# Patient Record
Sex: Male | Born: 1940 | Race: White | Hispanic: No | Marital: Married | Smoking: Current every day smoker
Health system: Southern US, Community
[De-identification: ages and names within clinical notes are randomized; demographics above are authoritative.]

## PROBLEM LIST (undated history)

## (undated) DIAGNOSIS — R9430 Abnormal result of cardiovascular function study, unspecified: Secondary | ICD-10-CM

## (undated) DIAGNOSIS — I739 Peripheral vascular disease, unspecified: Secondary | ICD-10-CM

## (undated) DIAGNOSIS — R251 Tremor, unspecified: Secondary | ICD-10-CM

## (undated) DIAGNOSIS — I714 Abdominal aortic aneurysm, without rupture, unspecified: Secondary | ICD-10-CM

## (undated) DIAGNOSIS — R011 Cardiac murmur, unspecified: Secondary | ICD-10-CM

## (undated) DIAGNOSIS — F17210 Nicotine dependence, cigarettes, uncomplicated: Secondary | ICD-10-CM

## (undated) DIAGNOSIS — M1990 Unspecified osteoarthritis, unspecified site: Secondary | ICD-10-CM

## (undated) DIAGNOSIS — E669 Obesity, unspecified: Secondary | ICD-10-CM

## (undated) DIAGNOSIS — I10 Essential (primary) hypertension: Secondary | ICD-10-CM

## (undated) DIAGNOSIS — I4510 Unspecified right bundle-branch block: Secondary | ICD-10-CM

## (undated) DIAGNOSIS — E8770 Fluid overload, unspecified: Secondary | ICD-10-CM

## (undated) DIAGNOSIS — Z9049 Acquired absence of other specified parts of digestive tract: Secondary | ICD-10-CM

## (undated) DIAGNOSIS — Z96659 Presence of unspecified artificial knee joint: Secondary | ICD-10-CM

## (undated) DIAGNOSIS — C61 Malignant neoplasm of prostate: Secondary | ICD-10-CM

## (undated) DIAGNOSIS — IMO0002 Reserved for concepts with insufficient information to code with codable children: Secondary | ICD-10-CM

## (undated) DIAGNOSIS — I4891 Unspecified atrial fibrillation: Secondary | ICD-10-CM

## (undated) DIAGNOSIS — Z01810 Encounter for preprocedural cardiovascular examination: Secondary | ICD-10-CM

## (undated) DIAGNOSIS — R001 Bradycardia, unspecified: Secondary | ICD-10-CM

## (undated) DIAGNOSIS — I2510 Atherosclerotic heart disease of native coronary artery without angina pectoris: Secondary | ICD-10-CM

## (undated) DIAGNOSIS — I219 Acute myocardial infarction, unspecified: Secondary | ICD-10-CM

## (undated) DIAGNOSIS — E785 Hyperlipidemia, unspecified: Secondary | ICD-10-CM

## (undated) DIAGNOSIS — Z7901 Long term (current) use of anticoagulants: Secondary | ICD-10-CM

## (undated) DIAGNOSIS — I779 Disorder of arteries and arterioles, unspecified: Secondary | ICD-10-CM

## (undated) HISTORY — DX: Atherosclerotic heart disease of native coronary artery without angina pectoris: I25.10

## (undated) HISTORY — DX: Fluid overload, unspecified: E87.70

## (undated) HISTORY — DX: Unspecified atrial fibrillation: I48.91

## (undated) HISTORY — PX: JOINT REPLACEMENT: SHX530

## (undated) HISTORY — DX: Essential (primary) hypertension: I10

## (undated) HISTORY — PX: ANAL FISSURE REPAIR: SHX2312

## (undated) HISTORY — DX: Unspecified right bundle-branch block: I45.10

## (undated) HISTORY — DX: Malignant neoplasm of prostate: C61

## (undated) HISTORY — DX: Reserved for concepts with insufficient information to code with codable children: IMO0002

## (undated) HISTORY — DX: Nicotine dependence, cigarettes, uncomplicated: F17.210

## (undated) HISTORY — DX: Abdominal aortic aneurysm, without rupture: I71.4

## (undated) HISTORY — DX: Abdominal aortic aneurysm, without rupture, unspecified: I71.40

## (undated) HISTORY — DX: Abnormal result of cardiovascular function study, unspecified: R94.30

## (undated) HISTORY — DX: Disorder of arteries and arterioles, unspecified: I77.9

## (undated) HISTORY — DX: Hyperlipidemia, unspecified: E78.5

## (undated) HISTORY — DX: Peripheral vascular disease, unspecified: I73.9

## (undated) HISTORY — DX: Encounter for preprocedural cardiovascular examination: Z01.810

## (undated) HISTORY — DX: Obesity, unspecified: E66.9

## (undated) HISTORY — DX: Acquired absence of other specified parts of digestive tract: Z90.49

## (undated) HISTORY — PX: TOTAL KNEE ARTHROPLASTY: SHX125

## (undated) HISTORY — DX: Acute myocardial infarction, unspecified: I21.9

## (undated) HISTORY — DX: Presence of unspecified artificial knee joint: Z96.659

## (undated) HISTORY — DX: Bradycardia, unspecified: R00.1

## (undated) HISTORY — PX: PR VEIN BYPASS GRAFT,AORTO-FEM-POP: 35551

## (undated) HISTORY — DX: Long term (current) use of anticoagulants: Z79.01

---

## 1982-03-11 DIAGNOSIS — I219 Acute myocardial infarction, unspecified: Secondary | ICD-10-CM

## 1982-03-11 HISTORY — DX: Acute myocardial infarction, unspecified: I21.9

## 1992-03-11 HISTORY — PX: CORONARY ARTERY BYPASS GRAFT: SHX141

## 2003-03-07 ENCOUNTER — Encounter: Payer: Self-pay | Admitting: Cardiology

## 2003-05-02 ENCOUNTER — Inpatient Hospital Stay (HOSPITAL_COMMUNITY): Admission: RE | Admit: 2003-05-02 | Discharge: 2003-05-06 | Payer: Self-pay | Admitting: Orthopedic Surgery

## 2004-02-14 ENCOUNTER — Ambulatory Visit: Payer: Self-pay | Admitting: Cardiology

## 2004-09-21 ENCOUNTER — Emergency Department (HOSPITAL_COMMUNITY): Admission: EM | Admit: 2004-09-21 | Discharge: 2004-09-21 | Payer: Self-pay | Admitting: Family Medicine

## 2004-10-15 ENCOUNTER — Ambulatory Visit: Payer: Self-pay | Admitting: Cardiology

## 2004-11-05 ENCOUNTER — Ambulatory Visit: Payer: Self-pay | Admitting: Cardiology

## 2004-12-18 ENCOUNTER — Ambulatory Visit: Payer: Self-pay | Admitting: Cardiology

## 2005-07-01 ENCOUNTER — Ambulatory Visit (HOSPITAL_COMMUNITY): Admission: RE | Admit: 2005-07-01 | Discharge: 2005-07-01 | Payer: Self-pay | Admitting: Urology

## 2005-07-04 ENCOUNTER — Ambulatory Visit: Payer: Self-pay | Admitting: Cardiology

## 2005-07-09 ENCOUNTER — Ambulatory Visit: Admission: RE | Admit: 2005-07-09 | Discharge: 2005-10-07 | Payer: Self-pay | Admitting: Radiation Oncology

## 2005-07-26 ENCOUNTER — Ambulatory Visit: Payer: Self-pay | Admitting: Cardiology

## 2005-07-30 ENCOUNTER — Ambulatory Visit: Payer: Self-pay

## 2005-07-30 ENCOUNTER — Encounter: Payer: Self-pay | Admitting: Cardiology

## 2005-10-02 ENCOUNTER — Encounter: Admission: RE | Admit: 2005-10-02 | Discharge: 2005-10-02 | Payer: Self-pay | Admitting: Urology

## 2005-10-09 ENCOUNTER — Ambulatory Visit (HOSPITAL_BASED_OUTPATIENT_CLINIC_OR_DEPARTMENT_OTHER): Admission: RE | Admit: 2005-10-09 | Discharge: 2005-10-09 | Payer: Self-pay | Admitting: Urology

## 2005-10-31 ENCOUNTER — Ambulatory Visit: Admission: RE | Admit: 2005-10-31 | Discharge: 2005-12-02 | Payer: Self-pay | Admitting: Radiation Oncology

## 2005-12-17 ENCOUNTER — Inpatient Hospital Stay (HOSPITAL_COMMUNITY): Admission: RE | Admit: 2005-12-17 | Discharge: 2005-12-20 | Payer: Self-pay | Admitting: Orthopedic Surgery

## 2006-02-04 ENCOUNTER — Ambulatory Visit: Payer: Self-pay | Admitting: Cardiology

## 2006-04-03 ENCOUNTER — Encounter: Admission: RE | Admit: 2006-04-03 | Discharge: 2006-04-03 | Payer: Self-pay | Admitting: *Deleted

## 2006-04-22 ENCOUNTER — Ambulatory Visit: Payer: Self-pay | Admitting: Cardiology

## 2006-07-07 ENCOUNTER — Encounter: Admission: RE | Admit: 2006-07-07 | Discharge: 2006-07-07 | Payer: Self-pay | Admitting: Gastroenterology

## 2006-07-08 ENCOUNTER — Ambulatory Visit (HOSPITAL_COMMUNITY): Admission: RE | Admit: 2006-07-08 | Discharge: 2006-07-08 | Payer: Self-pay | Admitting: Gastroenterology

## 2006-07-08 ENCOUNTER — Encounter (INDEPENDENT_AMBULATORY_CARE_PROVIDER_SITE_OTHER): Payer: Self-pay | Admitting: *Deleted

## 2006-10-02 ENCOUNTER — Ambulatory Visit: Payer: Self-pay | Admitting: *Deleted

## 2007-04-02 ENCOUNTER — Ambulatory Visit: Payer: Self-pay | Admitting: *Deleted

## 2007-04-22 ENCOUNTER — Ambulatory Visit: Payer: Self-pay | Admitting: Cardiology

## 2007-05-01 ENCOUNTER — Ambulatory Visit: Payer: Self-pay | Admitting: Cardiology

## 2007-10-01 ENCOUNTER — Ambulatory Visit: Payer: Self-pay | Admitting: *Deleted

## 2007-11-24 ENCOUNTER — Ambulatory Visit: Payer: Self-pay | Admitting: Cardiology

## 2007-12-01 ENCOUNTER — Ambulatory Visit: Payer: Self-pay | Admitting: Cardiology

## 2007-12-01 ENCOUNTER — Ambulatory Visit: Payer: Self-pay

## 2007-12-01 ENCOUNTER — Encounter: Payer: Self-pay | Admitting: Cardiology

## 2007-12-07 ENCOUNTER — Ambulatory Visit: Payer: Self-pay | Admitting: Cardiovascular Disease

## 2007-12-14 ENCOUNTER — Ambulatory Visit: Payer: Self-pay | Admitting: Cardiology

## 2007-12-14 LAB — CONVERTED CEMR LAB: Prothrombin Time: 81.5 s (ref 10.9–13.3)

## 2007-12-17 ENCOUNTER — Ambulatory Visit: Payer: Self-pay | Admitting: Cardiology

## 2007-12-24 ENCOUNTER — Ambulatory Visit: Payer: Self-pay | Admitting: Cardiology

## 2008-01-07 ENCOUNTER — Ambulatory Visit: Payer: Self-pay | Admitting: Cardiology

## 2008-01-07 ENCOUNTER — Ambulatory Visit: Payer: Self-pay | Admitting: Cardiovascular Disease

## 2008-01-21 ENCOUNTER — Ambulatory Visit: Payer: Self-pay | Admitting: Cardiology

## 2008-01-29 ENCOUNTER — Ambulatory Visit: Payer: Self-pay | Admitting: Cardiology

## 2008-01-29 ENCOUNTER — Ambulatory Visit: Payer: Self-pay | Admitting: Cardiovascular Disease

## 2008-02-12 ENCOUNTER — Ambulatory Visit: Payer: Self-pay | Admitting: Internal Medicine

## 2008-02-26 ENCOUNTER — Ambulatory Visit: Payer: Self-pay | Admitting: Cardiovascular Disease

## 2008-03-10 ENCOUNTER — Ambulatory Visit: Payer: Self-pay | Admitting: Internal Medicine

## 2008-03-24 ENCOUNTER — Ambulatory Visit: Payer: Self-pay | Admitting: Cardiovascular Disease

## 2008-03-31 ENCOUNTER — Ambulatory Visit: Payer: Self-pay | Admitting: *Deleted

## 2008-04-07 ENCOUNTER — Ambulatory Visit: Payer: Self-pay | Admitting: Internal Medicine

## 2008-04-26 ENCOUNTER — Ambulatory Visit: Payer: Self-pay | Admitting: Cardiology

## 2008-05-10 ENCOUNTER — Ambulatory Visit: Payer: Self-pay | Admitting: Internal Medicine

## 2008-05-24 ENCOUNTER — Ambulatory Visit: Payer: Self-pay | Admitting: Internal Medicine

## 2008-06-07 ENCOUNTER — Ambulatory Visit: Payer: Self-pay | Admitting: Cardiology

## 2008-06-28 ENCOUNTER — Ambulatory Visit: Payer: Self-pay | Admitting: Cardiology

## 2008-07-26 ENCOUNTER — Ambulatory Visit: Payer: Self-pay | Admitting: Internal Medicine

## 2008-08-10 ENCOUNTER — Encounter: Payer: Self-pay | Admitting: *Deleted

## 2008-08-23 ENCOUNTER — Ambulatory Visit: Payer: Self-pay | Admitting: Internal Medicine

## 2008-08-23 LAB — CONVERTED CEMR LAB: POC INR: 2.1

## 2008-09-01 ENCOUNTER — Ambulatory Visit: Payer: Self-pay | Admitting: *Deleted

## 2008-09-14 ENCOUNTER — Encounter: Payer: Self-pay | Admitting: *Deleted

## 2008-09-20 ENCOUNTER — Encounter (INDEPENDENT_AMBULATORY_CARE_PROVIDER_SITE_OTHER): Payer: Self-pay | Admitting: Cardiology

## 2008-09-20 ENCOUNTER — Ambulatory Visit: Payer: Self-pay | Admitting: Cardiology

## 2008-10-18 ENCOUNTER — Ambulatory Visit: Payer: Self-pay | Admitting: Internal Medicine

## 2008-10-18 LAB — CONVERTED CEMR LAB: POC INR: 2.1

## 2008-11-08 ENCOUNTER — Encounter: Payer: Self-pay | Admitting: Cardiology

## 2008-11-08 DIAGNOSIS — E8770 Fluid overload, unspecified: Secondary | ICD-10-CM

## 2008-11-08 DIAGNOSIS — Z87891 Personal history of nicotine dependence: Secondary | ICD-10-CM

## 2008-11-08 DIAGNOSIS — I714 Abdominal aortic aneurysm, without rupture, unspecified: Secondary | ICD-10-CM | POA: Insufficient documentation

## 2008-11-08 DIAGNOSIS — E785 Hyperlipidemia, unspecified: Secondary | ICD-10-CM

## 2008-11-09 ENCOUNTER — Ambulatory Visit: Payer: Self-pay | Admitting: Cardiology

## 2008-11-15 ENCOUNTER — Ambulatory Visit: Payer: Self-pay | Admitting: Cardiology

## 2008-11-15 LAB — CONVERTED CEMR LAB: POC INR: 2.8

## 2008-12-13 ENCOUNTER — Ambulatory Visit: Payer: Self-pay

## 2008-12-13 ENCOUNTER — Ambulatory Visit: Payer: Self-pay | Admitting: Cardiology

## 2008-12-13 LAB — CONVERTED CEMR LAB: POC INR: 2.8

## 2009-01-09 ENCOUNTER — Ambulatory Visit: Payer: Self-pay | Admitting: Cardiology

## 2009-01-09 LAB — CONVERTED CEMR LAB: POC INR: 2.6

## 2009-02-06 ENCOUNTER — Ambulatory Visit: Payer: Self-pay | Admitting: Cardiology

## 2009-02-06 LAB — CONVERTED CEMR LAB: POC INR: 2.9

## 2009-02-22 ENCOUNTER — Ambulatory Visit: Payer: Self-pay | Admitting: Vascular Surgery

## 2009-03-06 ENCOUNTER — Ambulatory Visit: Payer: Self-pay | Admitting: Internal Medicine

## 2009-03-22 ENCOUNTER — Emergency Department (HOSPITAL_COMMUNITY): Admission: EM | Admit: 2009-03-22 | Discharge: 2009-03-22 | Payer: Self-pay | Admitting: Family Medicine

## 2009-04-03 ENCOUNTER — Ambulatory Visit: Payer: Self-pay | Admitting: Cardiology

## 2009-04-03 LAB — CONVERTED CEMR LAB: INR: 2.5

## 2009-05-01 ENCOUNTER — Ambulatory Visit: Payer: Self-pay | Admitting: Internal Medicine

## 2009-05-16 ENCOUNTER — Ambulatory Visit: Payer: Self-pay | Admitting: Cardiology

## 2009-05-29 ENCOUNTER — Ambulatory Visit: Payer: Self-pay | Admitting: Cardiology

## 2009-06-02 ENCOUNTER — Telehealth: Payer: Self-pay | Admitting: Cardiology

## 2009-06-26 ENCOUNTER — Ambulatory Visit: Payer: Self-pay | Admitting: Cardiology

## 2009-06-26 LAB — CONVERTED CEMR LAB
INR: 2.7
POC INR: 2.7

## 2009-07-03 ENCOUNTER — Ambulatory Visit: Payer: Self-pay | Admitting: Surgery

## 2009-07-17 ENCOUNTER — Telehealth: Payer: Self-pay | Admitting: Cardiology

## 2009-07-17 ENCOUNTER — Ambulatory Visit: Payer: Self-pay | Admitting: Surgery

## 2009-07-24 ENCOUNTER — Telehealth: Payer: Self-pay | Admitting: Cardiology

## 2009-07-24 ENCOUNTER — Ambulatory Visit: Payer: Self-pay | Admitting: Cardiovascular Disease

## 2009-07-24 LAB — CONVERTED CEMR LAB: POC INR: 1.8

## 2009-07-27 ENCOUNTER — Ambulatory Visit: Payer: Self-pay | Admitting: Surgery

## 2009-07-27 ENCOUNTER — Ambulatory Visit (HOSPITAL_COMMUNITY): Admission: RE | Admit: 2009-07-27 | Discharge: 2009-07-27 | Payer: Self-pay | Admitting: Surgery

## 2009-08-03 ENCOUNTER — Encounter (HOSPITAL_BASED_OUTPATIENT_CLINIC_OR_DEPARTMENT_OTHER): Admission: RE | Admit: 2009-08-03 | Discharge: 2009-11-01 | Payer: Self-pay | Admitting: Internal Medicine

## 2009-08-04 ENCOUNTER — Ambulatory Visit (HOSPITAL_COMMUNITY): Admission: RE | Admit: 2009-08-04 | Discharge: 2009-08-04 | Payer: Self-pay | Admitting: Internal Medicine

## 2009-08-08 ENCOUNTER — Ambulatory Visit: Payer: Self-pay | Admitting: Internal Medicine

## 2009-08-08 LAB — CONVERTED CEMR LAB: POC INR: 1.2

## 2009-08-09 ENCOUNTER — Encounter: Payer: Self-pay | Admitting: Cardiology

## 2009-08-16 ENCOUNTER — Ambulatory Visit: Payer: Self-pay | Admitting: Cardiology

## 2009-08-16 LAB — CONVERTED CEMR LAB: POC INR: 1.5

## 2009-08-18 ENCOUNTER — Emergency Department (HOSPITAL_COMMUNITY): Admission: EM | Admit: 2009-08-18 | Discharge: 2009-08-18 | Payer: Self-pay | Admitting: Emergency Medicine

## 2009-08-21 ENCOUNTER — Encounter: Payer: Self-pay | Admitting: Cardiology

## 2009-08-21 ENCOUNTER — Ambulatory Visit: Payer: Self-pay | Admitting: Surgery

## 2009-08-23 ENCOUNTER — Inpatient Hospital Stay (HOSPITAL_COMMUNITY): Admission: EM | Admit: 2009-08-23 | Discharge: 2009-08-27 | Payer: Self-pay | Admitting: Emergency Medicine

## 2009-08-23 ENCOUNTER — Encounter (INDEPENDENT_AMBULATORY_CARE_PROVIDER_SITE_OTHER): Payer: Self-pay | Admitting: Emergency Medicine

## 2009-08-23 ENCOUNTER — Ambulatory Visit: Payer: Self-pay | Admitting: Vascular Surgery

## 2009-08-25 ENCOUNTER — Telehealth: Payer: Self-pay | Admitting: Cardiology

## 2009-08-29 ENCOUNTER — Encounter: Payer: Self-pay | Admitting: Cardiology

## 2009-08-30 ENCOUNTER — Encounter: Payer: Self-pay | Admitting: Cardiology

## 2009-08-31 ENCOUNTER — Encounter: Payer: Self-pay | Admitting: Cardiology

## 2009-08-31 ENCOUNTER — Encounter: Payer: Self-pay | Admitting: Internal Medicine

## 2009-08-31 LAB — CONVERTED CEMR LAB: Prothrombin Time: 43.2 s

## 2009-09-07 ENCOUNTER — Encounter: Payer: Self-pay | Admitting: Cardiology

## 2009-09-18 ENCOUNTER — Encounter: Payer: Self-pay | Admitting: Internal Medicine

## 2009-09-18 ENCOUNTER — Ambulatory Visit: Payer: Self-pay | Admitting: Surgery

## 2009-09-18 ENCOUNTER — Encounter: Payer: Self-pay | Admitting: Cardiology

## 2009-09-22 ENCOUNTER — Inpatient Hospital Stay (HOSPITAL_COMMUNITY): Admission: EM | Admit: 2009-09-22 | Discharge: 2009-09-23 | Payer: Self-pay | Admitting: Emergency Medicine

## 2009-09-22 ENCOUNTER — Encounter (INDEPENDENT_AMBULATORY_CARE_PROVIDER_SITE_OTHER): Payer: Self-pay | Admitting: Family Medicine

## 2009-09-22 ENCOUNTER — Ambulatory Visit: Payer: Self-pay | Admitting: Vascular Surgery

## 2009-09-22 ENCOUNTER — Ambulatory Visit: Payer: Self-pay | Admitting: Cardiovascular Disease

## 2009-09-22 ENCOUNTER — Encounter (INDEPENDENT_AMBULATORY_CARE_PROVIDER_SITE_OTHER): Payer: Self-pay | Admitting: Internal Medicine

## 2009-09-24 ENCOUNTER — Encounter: Payer: Self-pay | Admitting: Cardiology

## 2009-09-25 ENCOUNTER — Encounter: Payer: Self-pay | Admitting: Cardiology

## 2009-09-25 LAB — CONVERTED CEMR LAB
POC INR: 2
Prothrombin Time: 19.9 s

## 2009-09-27 ENCOUNTER — Ambulatory Visit: Payer: Self-pay | Admitting: Cardiology

## 2009-09-27 ENCOUNTER — Inpatient Hospital Stay (HOSPITAL_COMMUNITY): Admission: EM | Admit: 2009-09-27 | Discharge: 2009-10-05 | Payer: Self-pay | Admitting: Emergency Medicine

## 2009-09-29 ENCOUNTER — Ambulatory Visit: Payer: Self-pay | Admitting: Internal Medicine

## 2009-10-04 ENCOUNTER — Telehealth: Payer: Self-pay | Admitting: Cardiology

## 2009-10-26 ENCOUNTER — Encounter: Payer: Self-pay | Admitting: Cardiology

## 2009-10-26 ENCOUNTER — Encounter: Payer: Self-pay | Admitting: Cardiovascular Disease

## 2009-10-26 LAB — CONVERTED CEMR LAB
POC INR: 1.86
Prothrombin Time: 21.6 s

## 2009-11-02 ENCOUNTER — Encounter: Payer: Self-pay | Admitting: Internal Medicine

## 2009-11-02 LAB — CONVERTED CEMR LAB
POC INR: 2.7
Prothrombin Time: 32 s

## 2009-11-06 ENCOUNTER — Encounter: Payer: Self-pay | Admitting: Cardiology

## 2009-11-07 ENCOUNTER — Encounter: Payer: Self-pay | Admitting: Cardiology

## 2009-11-14 ENCOUNTER — Encounter: Payer: Self-pay | Admitting: Cardiovascular Disease

## 2009-11-20 ENCOUNTER — Encounter: Payer: Self-pay | Admitting: Cardiology

## 2009-11-22 ENCOUNTER — Encounter: Payer: Self-pay | Admitting: Internal Medicine

## 2009-11-22 ENCOUNTER — Encounter: Payer: Self-pay | Admitting: Cardiology

## 2009-11-26 ENCOUNTER — Encounter: Payer: Self-pay | Admitting: Cardiology

## 2009-11-27 ENCOUNTER — Encounter: Payer: Self-pay | Admitting: Cardiovascular Disease

## 2009-11-27 LAB — CONVERTED CEMR LAB: Prothrombin Time: 33.6 s

## 2009-12-04 ENCOUNTER — Encounter: Payer: Self-pay | Admitting: Cardiovascular Disease

## 2009-12-04 ENCOUNTER — Encounter: Payer: Self-pay | Admitting: Cardiology

## 2009-12-04 LAB — CONVERTED CEMR LAB
POC INR: 2
Prothrombin Time: 23.7 s

## 2009-12-11 ENCOUNTER — Encounter: Payer: Self-pay | Admitting: Internal Medicine

## 2009-12-11 LAB — CONVERTED CEMR LAB
POC INR: 1.5
Prothrombin Time: 18 s

## 2009-12-13 ENCOUNTER — Encounter: Payer: Self-pay | Admitting: Cardiology

## 2009-12-18 ENCOUNTER — Encounter: Payer: Self-pay | Admitting: Cardiology

## 2009-12-18 LAB — CONVERTED CEMR LAB: POC INR: 1.6

## 2009-12-19 ENCOUNTER — Ambulatory Visit: Payer: Self-pay | Admitting: Infectious Disease

## 2009-12-19 DIAGNOSIS — L02419 Cutaneous abscess of limb, unspecified: Secondary | ICD-10-CM | POA: Insufficient documentation

## 2009-12-19 DIAGNOSIS — L03119 Cellulitis of unspecified part of limb: Secondary | ICD-10-CM

## 2009-12-19 LAB — CONVERTED CEMR LAB
AST: 23 units/L (ref 0–37)
Albumin: 4.1 g/dL (ref 3.5–5.2)
Alkaline Phosphatase: 125 units/L — ABNORMAL HIGH (ref 39–117)
BUN: 17 mg/dL (ref 6–23)
Basophils Absolute: 0 10*3/uL (ref 0.0–0.1)
Basophils Relative: 1 % (ref 0–1)
CRP: 1.4 mg/dL — ABNORMAL HIGH (ref <0.6)
Eosinophils Absolute: 0.2 10*3/uL (ref 0.0–0.7)
Glucose, Bld: 113 mg/dL — ABNORMAL HIGH (ref 70–99)
MCHC: 31.2 g/dL (ref 30.0–36.0)
MCV: 89.8 fL (ref 78.0–100.0)
Monocytes Absolute: 0.8 10*3/uL (ref 0.1–1.0)
Monocytes Relative: 12 % (ref 3–12)
Neutro Abs: 4.2 10*3/uL (ref 1.7–7.7)
Neutrophils Relative %: 60 % (ref 43–77)
Potassium: 3.8 meq/L (ref 3.5–5.3)
RDW: 15.3 % (ref 11.5–15.5)
Total Bilirubin: 0.6 mg/dL (ref 0.3–1.2)

## 2009-12-20 ENCOUNTER — Encounter: Payer: Self-pay | Admitting: Cardiology

## 2009-12-26 ENCOUNTER — Encounter: Payer: Self-pay | Admitting: Infectious Disease

## 2010-01-05 ENCOUNTER — Encounter: Payer: Self-pay | Admitting: Cardiovascular Disease

## 2010-01-05 LAB — CONVERTED CEMR LAB
POC INR: 2.7
Prothrombin Time: 27.2 s

## 2010-01-09 ENCOUNTER — Encounter: Payer: Self-pay | Admitting: Infectious Disease

## 2010-01-10 ENCOUNTER — Encounter: Payer: Self-pay | Admitting: Cardiology

## 2010-01-10 LAB — CONVERTED CEMR LAB
POC INR: 2.7
Prothrombin Time: 26.8 s

## 2010-01-17 ENCOUNTER — Encounter: Payer: Self-pay | Admitting: Cardiovascular Disease

## 2010-01-24 ENCOUNTER — Encounter: Payer: Self-pay | Admitting: Cardiology

## 2010-01-31 ENCOUNTER — Encounter: Payer: Self-pay | Admitting: Cardiology

## 2010-01-31 LAB — CONVERTED CEMR LAB: POC INR: 2.1

## 2010-02-02 ENCOUNTER — Ambulatory Visit: Payer: Self-pay | Admitting: Cardiology

## 2010-02-09 ENCOUNTER — Inpatient Hospital Stay (HOSPITAL_COMMUNITY)
Admission: RE | Admit: 2010-02-09 | Discharge: 2010-02-13 | Payer: Self-pay | Source: Home / Self Care | Admitting: Orthopedic Surgery

## 2010-02-14 ENCOUNTER — Encounter: Payer: Self-pay | Admitting: Internal Medicine

## 2010-02-14 LAB — CONVERTED CEMR LAB: POC INR: 1.7

## 2010-02-15 ENCOUNTER — Encounter: Payer: Self-pay | Admitting: Internal Medicine

## 2010-02-20 ENCOUNTER — Encounter (INDEPENDENT_AMBULATORY_CARE_PROVIDER_SITE_OTHER): Payer: Self-pay | Admitting: Pharmacist

## 2010-02-21 ENCOUNTER — Encounter: Payer: Self-pay | Admitting: Cardiology

## 2010-02-21 LAB — CONVERTED CEMR LAB
POC INR: 1.98
Prothrombin Time: 22.7 s

## 2010-02-23 ENCOUNTER — Encounter: Payer: Self-pay | Admitting: Internal Medicine

## 2010-02-28 ENCOUNTER — Ambulatory Visit: Payer: Self-pay | Admitting: Infectious Disease

## 2010-02-28 DIAGNOSIS — B951 Streptococcus, group B, as the cause of diseases classified elsewhere: Secondary | ICD-10-CM

## 2010-03-07 ENCOUNTER — Encounter: Payer: Self-pay | Admitting: Cardiology

## 2010-03-13 ENCOUNTER — Encounter: Payer: Self-pay | Admitting: Internal Medicine

## 2010-03-20 ENCOUNTER — Encounter: Payer: Self-pay | Admitting: Internal Medicine

## 2010-03-26 ENCOUNTER — Inpatient Hospital Stay (HOSPITAL_COMMUNITY)
Admission: RE | Admit: 2010-03-26 | Discharge: 2010-03-30 | Payer: Self-pay | Source: Home / Self Care | Attending: Orthopedic Surgery | Admitting: Orthopedic Surgery

## 2010-03-26 LAB — DIFFERENTIAL
Basophils Absolute: 0.1 10*3/uL (ref 0.0–0.1)
Basophils Relative: 1 % (ref 0–1)
Eosinophils Absolute: 0.1 10*3/uL (ref 0.0–0.7)
Eosinophils Relative: 2 % (ref 0–5)
Lymphocytes Relative: 26 % (ref 12–46)
Lymphs Abs: 2 10*3/uL (ref 0.7–4.0)
Monocytes Absolute: 0.8 10*3/uL (ref 0.1–1.0)
Monocytes Relative: 10 % (ref 3–12)
Neutro Abs: 4.5 10*3/uL (ref 1.7–7.7)
Neutrophils Relative %: 61 % (ref 43–77)

## 2010-03-26 LAB — BASIC METABOLIC PANEL
BUN: 19 mg/dL (ref 6–23)
CO2: 32 mEq/L (ref 19–32)
Calcium: 9.4 mg/dL (ref 8.4–10.5)
Chloride: 96 mEq/L (ref 96–112)
Creatinine, Ser: 0.69 mg/dL (ref 0.4–1.5)
GFR calc Af Amer: >60 mL/min (ref >60)
GFR calc non Af Amer: >60 mL/min (ref >60)
Glucose, Bld: 108 mg/dL — ABNORMAL HIGH (ref 70–99)
Potassium: 3.5 mEq/L (ref 3.5–5.1)
Sodium: 138 mEq/L (ref 135–145)

## 2010-03-26 LAB — PROTIME-INR
INR: 1.53 — ABNORMAL HIGH (ref 0.00–1.49)
Prothrombin Time: 18.6 seconds — ABNORMAL HIGH (ref 11.6–15.2)

## 2010-03-26 LAB — URINALYSIS, ROUTINE W REFLEX MICROSCOPIC
Bilirubin Urine: NEGATIVE
Hgb urine dipstick: NEGATIVE
Ketones, ur: NEGATIVE mg/dL
Nitrite: NEGATIVE
Protein, ur: NEGATIVE mg/dL
Specific Gravity, Urine: 1.028 (ref 1.005–1.030)
Urine Glucose, Fasting: NEGATIVE mg/dL
Urobilinogen, UA: 0.2 mg/dL (ref 0.0–1.0)
pH: 6.5 (ref 5.0–8.0)

## 2010-03-26 LAB — SURGICAL PCR SCREEN
MRSA, PCR: NEGATIVE
Staphylococcus aureus: NEGATIVE

## 2010-03-26 LAB — CBC
HCT: 33.8 % — ABNORMAL LOW (ref 39.0–52.0)
Hemoglobin: 11.1 g/dL — ABNORMAL LOW (ref 13.0–17.0)
MCH: 29.1 pg (ref 26.0–34.0)
MCHC: 32.8 g/dL (ref 30.0–36.0)
MCV: 88.5 fL (ref 78.0–100.0)
Platelets: 232 10*3/uL (ref 150–400)
RBC: 3.82 MIL/uL — ABNORMAL LOW (ref 4.22–5.81)
RDW: 16.4 % — ABNORMAL HIGH (ref 11.5–15.5)
WBC: 7.4 10*3/uL (ref 4.0–10.5)

## 2010-03-26 LAB — APTT: aPTT: 54 seconds — ABNORMAL HIGH (ref 24–37)

## 2010-03-28 LAB — GRAM STAIN

## 2010-03-28 LAB — GLUCOSE, CAPILLARY
Glucose-Capillary: 120 mg/dL — ABNORMAL HIGH (ref 70–99)
Glucose-Capillary: 112 mg/dL — ABNORMAL HIGH (ref 70–99)
Glucose-Capillary: 240 mg/dL — ABNORMAL HIGH (ref 70–99)
Glucose-Capillary: 116 mg/dL — ABNORMAL HIGH (ref 70–99)
Glucose-Capillary: 135 mg/dL — ABNORMAL HIGH (ref 70–99)
Glucose-Capillary: 121 mg/dL — ABNORMAL HIGH (ref 70–99)

## 2010-03-28 LAB — BASIC METABOLIC PANEL
BUN: 15 mg/dL (ref 6–23)
CO2: 30 mEq/L (ref 19–32)
Calcium: 8.8 mg/dL (ref 8.4–10.5)
Chloride: 101 mEq/L (ref 96–112)
Creatinine, Ser: 0.75 mg/dL (ref 0.4–1.5)
GFR calc Af Amer: >60 mL/min (ref >60)
GFR calc non Af Amer: >60 mL/min (ref >60)
Glucose, Bld: 142 mg/dL — ABNORMAL HIGH (ref 70–99)
Potassium: 3.6 mEq/L (ref 3.5–5.1)
Sodium: 138 mEq/L (ref 135–145)

## 2010-03-28 LAB — CBC
HCT: 29.7 % — ABNORMAL LOW (ref 39.0–52.0)
Hemoglobin: 9.4 g/dL — ABNORMAL LOW (ref 13.0–17.0)
MCH: 28.1 pg (ref 26.0–34.0)
MCHC: 31.6 g/dL (ref 30.0–36.0)
MCV: 88.7 fL (ref 78.0–100.0)
Platelets: 176 10*3/uL (ref 150–400)
RBC: 3.35 MIL/uL — ABNORMAL LOW (ref 4.22–5.81)
RDW: 16.3 % — ABNORMAL HIGH (ref 11.5–15.5)
WBC: 8.4 10*3/uL (ref 4.0–10.5)

## 2010-03-28 LAB — PROTIME-INR
INR: 1.3 (ref 0.00–1.49)
INR: 1.26 (ref 0.00–1.49)
Prothrombin Time: 16.4 seconds — ABNORMAL HIGH (ref 11.6–15.2)
Prothrombin Time: 16 seconds — ABNORMAL HIGH (ref 11.6–15.2)

## 2010-03-28 LAB — APTT: aPTT: 47 seconds — ABNORMAL HIGH (ref 24–37)

## 2010-04-02 ENCOUNTER — Encounter: Payer: Self-pay | Admitting: Cardiology

## 2010-04-02 LAB — CBC
HCT: 27.4 % — ABNORMAL LOW (ref 39.0–52.0)
HCT: 27.6 % — ABNORMAL LOW (ref 39.0–52.0)
Hemoglobin: 8.8 g/dL — ABNORMAL LOW (ref 13.0–17.0)
MCH: 28.2 pg (ref 26.0–34.0)
MCHC: 32.1 g/dL (ref 30.0–36.0)
MCHC: 33 g/dL (ref 30.0–36.0)
RDW: 16 % — ABNORMAL HIGH (ref 11.5–15.5)

## 2010-04-02 LAB — PROTIME-INR
INR: 1.47 (ref 0.00–1.49)
INR: 1.51 — ABNORMAL HIGH (ref 0.00–1.49)
Prothrombin Time: 18 seconds — ABNORMAL HIGH (ref 11.6–15.2)
Prothrombin Time: 18.4 seconds — ABNORMAL HIGH (ref 11.6–15.2)

## 2010-04-02 LAB — GLUCOSE, CAPILLARY
Glucose-Capillary: 111 mg/dL — ABNORMAL HIGH (ref 70–99)
Glucose-Capillary: 104 mg/dL — ABNORMAL HIGH (ref 70–99)
Glucose-Capillary: 102 mg/dL — ABNORMAL HIGH (ref 70–99)
Glucose-Capillary: 87 mg/dL (ref 70–99)
Glucose-Capillary: 135 mg/dL — ABNORMAL HIGH (ref 70–99)

## 2010-04-02 LAB — C-REACTIVE PROTEIN: CRP: 10.8 mg/dL — ABNORMAL HIGH (ref <0.6)

## 2010-04-02 LAB — DIFFERENTIAL
Basophils Absolute: 0 10*3/uL (ref 0.0–0.1)
Basophils Relative: 0 % (ref 0–1)
Eosinophils Relative: 3 % (ref 0–5)
Monocytes Absolute: 1 10*3/uL (ref 0.1–1.0)

## 2010-04-02 LAB — BASIC METABOLIC PANEL
BUN: 14 mg/dL (ref 6–23)
BUN: 14 mg/dL (ref 6–23)
BUN: 16 mg/dL (ref 6–23)
CO2: 31 mEq/L (ref 19–32)
Calcium: 8.7 mg/dL (ref 8.4–10.5)
Calcium: 8.8 mg/dL (ref 8.4–10.5)
Chloride: 100 mEq/L (ref 96–112)
Chloride: 99 mEq/L (ref 96–112)
Creatinine, Ser: 0.69 mg/dL (ref 0.4–1.5)
Creatinine, Ser: 0.67 mg/dL (ref 0.4–1.5)
Creatinine, Ser: 0.7 mg/dL (ref 0.4–1.5)
GFR calc Af Amer: >60 mL/min (ref >60)
GFR calc non Af Amer: >60 mL/min (ref >60)
GFR calc non Af Amer: >60 mL/min (ref >60)
Glucose, Bld: 104 mg/dL — ABNORMAL HIGH (ref 70–99)
Glucose, Bld: 123 mg/dL — ABNORMAL HIGH (ref 70–99)
Potassium: 2.9 mEq/L — ABNORMAL LOW (ref 3.5–5.1)
Sodium: 138 mEq/L (ref 135–145)

## 2010-04-02 LAB — WOUND CULTURE: Culture: NO GROWTH

## 2010-04-02 LAB — CONVERTED CEMR LAB: POC INR: 2.12

## 2010-04-02 LAB — SEDIMENTATION RATE: Sed Rate: 124 mm/hr — ABNORMAL HIGH (ref 0–16)

## 2010-04-02 NOTE — H&P (Signed)
NAME:  Derek Frye, KLEMP NO.:  [id number]  MEDICAL RECORD NO.:  [id number]          PATIENT TYPE:  INP  LOCATION:  1617                         FACILITY:  [facility]  PHYSICIAN:  Madlyn Frankel. Charlann Boxer, M.D.  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  BRIEF HISTORY:  Mr. 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  He is 5 weeks since his last procedure.  He presented to the office on March 15, 2010 with evidence of fixed flexed contracted knee with concern for soft tissue impingement leading to a nonhealing wound on the distal portion.  I reviewed with them that I felt that it was in his best interest to take him back to the operating room to wash out his knee, again debridement as necessary, exchange the antibiotic spacer to allow for decrease soft tissue tension with a possible utilization of wound VAC to get wound healing.  He has currently been on Rocephin as well as narcotic medications for pain.  He has had limited functional abilities based on his flexion contracture.  PAST MEDICAL HISTORY: 1. Peripheral vascular disease. 2. History of abdominal aortic aneurysm. 3. History of coronary artery disease with CABG in 1995 and 1994. 4. Diabetes. 5. Hypertension. 6. Hyperlipidemia. 7. Stasis ulcers.  SURGICAL HISTORY:  Includes left total knee replacement.  He had done well with his knee replacement for a couple years prior to the development of infection which most likely was a result of chronic venous stasis ulcers  and open wounds on his left foot and ankle.  His most recent procedure was 5 to 6 weeks ago with I and D and IV antibiotic spacer at this point with chronic wound.  SOCIAL HISTORY:  He is married and his wife is with him on every appointment and every hospitalization and is very loyal.  There has been having no smoking or alcohol history recently.  FAMILY MEDICAL HISTORY:  Positive for hypertension, coronary artery disease, and diabetes.  CURRENT MEDICATIONS: 1. Amaryl. 2. Metformin. 3. Zocor. 4. Coumadin. 5. Altace. 6. Lasix. 7. Rocephin. 8. Hydrocodone as needed. 9. Flexeril. 10.Aspirin.  DRUG ALLERGIES:  AMOXICILLIN and MORPHINE.  REVIEW OF SYSTEMS:  At the time of admission, review of systems is only positive for that noted in the HPI with no recent chest pains, productive cough, hemoptysis.  No breathing difficulties with wheezing. No dizziness, headaches, blurred vision, slurred speech.  PHYSICAL EXAMINATION:  VITAL SIGNS:  Examination at the time of admission he was afebrile with stable vital signs. GENERAL:  He is awake, alert, and oriented to all instances being addressed.  He did not require supplemental oxygen.  Did not have any breathing difficulties.  He is obese. ABDOMEN:  Soft, nontender.  Heart had normal  heart sounds and no significant murmur appreciated. EXTREMITIES:  His lower extremity examination revealed bilateral venous stasis skin changes of left side with chronic heel wounds been addressed with nursing at home.  His left knee revealed a distal portion of the incision was nonhealed and somewhat dehisced.  He had a persistent flexion and contracture most likely related to issues of a cement spacer inside of knee.  He had to use wheelchair for transportation.  His activities are otherwise limited.  LABORATORY DATA:  Labs were pending in his admission to the hospital. Chest x-ray pending as well.  ASSESSMENT:  Chronic infected left total knee,  status post resection with antibiotic spacer now with flexion and contracture and wound dehiscence  PLAN:  Mr. Cothron to be admitted to hospital for same-day surgery.  He is to have a repeat I and D, removal of antibiotic spacer, replacement of antibiotic spacer with possible wound VAC application.  The goal was to try to get his leg to heal and preferably in the straight position as opposed to knee flexed position to allow for potential weightbearing down the road and also be critical to have the knee straight to allow for decrease soft tissue impingement that would decrease the oxygen tension of the site to allow for wound healing.  Will probably need to reconsult the infectious diseases despite the fact he is on Rocephin to see if there is any other potential benefit for additional antibiotic therapy.  Questions were encouraged and interviewed with Mr. Jachim and his wife and noted on his own in this admission.     Madlyn Frankel Charlann Boxer, M.D.    MDO/MEDQ  D:  03/28/2010  T:  03/28/2010  Job:  [id number]  Electronically Signed by Durene Romans M.D. on 04/02/2010 09:35:00 AM

## 2010-04-03 ENCOUNTER — Encounter: Payer: Self-pay | Admitting: Internal Medicine

## 2010-04-03 ENCOUNTER — Encounter: Payer: Self-pay | Admitting: Infectious Disease

## 2010-04-03 ENCOUNTER — Encounter: Payer: Self-pay | Admitting: Cardiology

## 2010-04-03 LAB — GLUCOSE, CAPILLARY

## 2010-04-03 LAB — ANAEROBIC CULTURE

## 2010-04-06 ENCOUNTER — Encounter: Payer: Self-pay | Admitting: Internal Medicine

## 2010-04-06 ENCOUNTER — Encounter: Payer: Self-pay | Admitting: Cardiology

## 2010-04-06 LAB — CONVERTED CEMR LAB: POC INR: 2.09

## 2010-04-09 ENCOUNTER — Encounter: Payer: Self-pay | Admitting: Infectious Disease

## 2010-04-10 NOTE — Medication Information (Signed)
Summary: Coumadin Clinic  Anticoagulant Therapy  Managed by: Weston Brass, PharmD Referring MD: Willa Rough MD PCP: Benedetto Goad, MD Supervising MD: Shirlee Latch MD, Sevon Rotert Indication 1: Atrial Fibrillation (ICD-427.31) Lab Used: [facility] Site: [address] INR POC 2.1 INR RANGE 2 - 3  Dietary changes: no    Health status changes: no    Bleeding/hemorrhagic complications: no    Recent/future hospitalizations: no    Any changes in medication regimen? yes       Details: now off cipro; still on doxycycline  Recent/future dental: no  Any missed doses?: no       Is patient compliant with meds? yes       Allergies: 1)  ! Morphine 2)  ! Amoxicillin  Anticoagulation Management History:      His anticoagulation is being managed by telephone today.  Positive risk factors for bleeding include an age of 72 years or older.  The bleeding index is 'intermediate risk'.  Negative CHADS2 values include Age > 72 years old.  The start date was 11/24/2007.  His last INR was 2.7.  Anticoagulation responsible provider: Shirlee Latch MD, Bayden Gil.  INR POC: 2.1.  Exp: 10/2010.    Anticoagulation Management Assessment/Plan:      The patient's current anticoagulation dose is Coumadin 2.5 mg tabs: Take as directed by coumadin clinic..  The target INR is 2 - 3.  The next INR is due 02/28/2010.  Anticoagulation instructions were given to patient.  Results were reviewed/authorized by Weston Brass, PharmD.  He was notified by Weston Brass PharmD.         Prior Anticoagulation Instructions: INR 2.2 Continue 2.5mg s daily except 1.25mg s on MWF.  Recheck in  2 weeks. Orders sent to [facility].   Current Anticoagulation Instructions: INR 2.1  Spoke with pt's wife.  Continue same dose of 1 tablet every day except 1/2 tablet on Monday, Wednesday and Friday.  Recheck INR in 4 weeks.  Orders faxed to [facility].

## 2010-04-10 NOTE — Medication Information (Signed)
Summary: Coumadin Clinic  Anticoagulant Therapy  Managed by: Weston Brass, PharmD Referring MD: Willa Rough MD PCP: Benedetto Goad, MD Supervising MD: Excell Seltzer MD, Casimiro Needle Indication 1: Atrial Fibrillation (ICD-427.31) Lab Used: [facility] Site: [address] PT 27.2 INR POC 2.7 INR RANGE 2 - 3  Dietary changes: no    Health status changes: no    Bleeding/hemorrhagic complications: no    Recent/future hospitalizations: no    Any changes in medication regimen? no    Recent/future dental: no  Any missed doses?: no       Is patient compliant with meds? yes      Comments: Pt reports taking 1.25mg  on M, W and F rather than just on M and F.   Allergies: 1)  ! Morphine 2)  ! Amoxicillin  Anticoagulation Management History:      His anticoagulation is being managed by telephone today.  Positive risk factors for bleeding include an age of 36 years or older.  The bleeding index is 'intermediate risk'.  Negative CHADS2 values include Age > 48 years old.  The start date was 11/24/2007.  His last INR was 2.7.  Prothrombin time is 27.2.  Anticoagulation responsible provider: Excell Seltzer MD, Casimiro Needle.  INR POC: 2.7.  Exp: 10/2010.    Anticoagulation Management Assessment/Plan:      The patient's current anticoagulation dose is Coumadin 2.5 mg tabs: Take as directed by coumadin clinic..  The target INR is 2 - 3.  The next INR is due 01/19/2010.  Anticoagulation instructions were given to home health nurse.  Results were reviewed/authorized by Weston Brass, PharmD.  He was notified by Weston Brass PharmD.         Prior Anticoagulation Instructions: INR 1.6  LMOM for pt. Weston Brass PharmD  December 18, 2009 10:10 AM   Spoke with pt.  Take 1 tablet today then increase dose to 1 tablet every day except 1/2 tablet on Monday and Friday.  Recheck INR in 1 week.  Orders given to Diane, pt is seeing MD on Thursday and decision on whether [facility] will be extended or d/c will be made.  HH RN has been instructed to  call us if discharging pt.   Current Anticoagulation Instructions: INR 2.7  Spoke with pt's wife.  Continue same dose of 1 tablet every day except 1/2 tablet on Monday, Wednesday and Friday.  Recheck INR in 2 weeks. Orders faxed to [facility].

## 2010-04-10 NOTE — Progress Notes (Signed)
Summary: questions re med changes-pt in hosptial  Phone Note Call from Patient   Caller: Patient Reason for Call: Talk to Nurse, Talk to Doctor Summary of Call: pt's wife calling re concerns with changes in medication made last night while pt at hospital, pt  still at [facility], will move to rehab probably tomorrow, wife, pam will not make med changes until she gets an ok from dr Milderd Meager call [phone] Initial call taken by: Glynda Jaeger,  October 04, 2009 10:36 AM  Follow-up for Phone Call        I spoke with the pt's wife and she is concerned because Dr Daleen Squibb rounded on the pt and restarted Lasix and Altace.  The pt has also been started on lopressor 25mg  two times a day.   She is concerned because Dr Myrtis Ser had put the Lasix and Altace on hold due to dehydration. The pt's BP is also running low in the hospital.  I made her aware that Dr Myrtis Ser is currently out of the office this week.  I instructed her to speak with the pt's nurse and have our hospital team paged to answer her questions prior to discharge.    Follow-up by: Julieta Gutting, RN, BSN,  October 04, 2009 10:46 AM

## 2010-04-10 NOTE — Medication Information (Signed)
Summary: Coumadin Clinic  Anticoagulant Therapy  Managed by: Weston Brass, PharmD Referring MD: Willa Rough MD PCP: Benedetto Goad, MD Supervising MD: Excell Seltzer MD, Casimiro Needle Indication 1: Atrial Fibrillation (ICD-427.31) Lab Used: [facility] Site: [address] PT 21.6 INR POC 1.86 INR RANGE 2 - 3  Dietary changes: no    Health status changes: no    Bleeding/hemorrhagic complications: no    Recent/future hospitalizations: yes       Details: was in hospital then in [facility] for rehab  Any changes in medication regimen? yes       Details: pt unsure if any medication   Recent/future dental: no  Any missed doses?: no       Is patient compliant with meds? yes       Allergies: 1)  ! Morphine  Anticoagulation Management History:      His anticoagulation is being managed by telephone today.  Positive risk factors for bleeding include an age of 40 years or older.  The bleeding index is 'intermediate risk'.  Negative CHADS2 values include Age > 17 years old.  The start date was 11/24/2007.  His last INR was 2.7.  Prothrombin time is 21.6.  Anticoagulation responsible provider: Excell Seltzer MD, Casimiro Needle.  INR POC: 1.86.  Exp: 10/2010.    Anticoagulation Management Assessment/Plan:      The patient's current anticoagulation dose is Coumadin 2.5 mg tabs: Take as directed by coumadin clinic..  The target INR is 2 - 3.  The next INR is due 11/02/2009.  Anticoagulation instructions were given to patient.  Results were reviewed/authorized by Weston Brass, PharmD.  He was notified by Weston Brass PharmD.         Prior Anticoagulation Instructions: INR 2.0  Called spoke with pt advised to continue on same dosage 1 tablet daily except 1/2 tablet on Mondays, Wednesdays, and Fridays.  Recheck in 1 week.  Faxed orders back to [place].   Current Anticoagulation Instructions: INR 1.86  Spoke with pt.  Take extra 1/2 tablet  today then change dose to 1 tablet every day except 1/2 tablet on Monday and  Friday.  Recheck INR in 1 week.  Orders given to [facility] with [facility].  Appended Document: Coumadin Clinic Pt is having weekly lab draws on Mondays for IV medication monitoring, gave Judeth Cornfield [facility] RN verbal orders to draw PT/INR weekly with other labs starting 11/06/09. OK per Weston Brass, PharmD. [facility]

## 2010-04-10 NOTE — Medication Information (Signed)
Summary: Coumadin Clinic  Anticoagulant Therapy  Managed by: Cloyde Reams, RN, BSN Referring MD: Willa Rough MD PCP: Benedetto Goad, MD Supervising MD: Johney Frame MD, Fayrene Fearing Indication 1: Atrial Fibrillation (ICD-427.31) Lab Used: [facility] Site: [address] PT 43.2 INR POC 4.3 INR RANGE 2 - 3  Dietary changes: no     Bleeding/hemorrhagic complications: no    Recent/future hospitalizations: yes       Details: Discharged from [facility] on Sunday 6/19.  Any changes in medication regimen? yes       Details: Currently on Bactrim, tomorrow last day.   Any missed doses?: no       Is patient compliant with meds? yes       Allergies: 1)  ! Morphine  Anticoagulation Management History:      His anticoagulation is being managed by telephone today.  Positive risk factors for bleeding include an age of 72 years or older.  The bleeding index is 'intermediate risk'.  Negative CHADS2 values include Age > 61 years old.  The start date was 11/24/2007.  His last INR was 2.7.  Prothrombin time is 43.2.  Anticoagulation responsible provider: Waylan Busta MD, Fayrene Fearing.  INR POC: 4.3.  Exp: 10/2010.    Anticoagulation Management Assessment/Plan:      The patient's current anticoagulation dose is Coumadin 2.5 mg tabs: Take as directed by coumadin clinic..  The target INR is 2 - 3.  The next INR is due 09/07/2009.  Anticoagulation instructions were given to patient.  Results were reviewed/authorized by Cloyde Reams, RN, BSN.  He was notified by Cloyde Reams, RN, BSN.         Prior Anticoagulation Instructions: INR 1.5 Today take 1 1/2 pills and Thursday, then resume 1 pill everyday except 1/2 pill on Mondays and Fridays. Recheck in one week.   Current Anticoagulation Instructions: INR 4.3  Called spoke with pt advised to hold x 2 doses then resume same dosage 2.5mg  daily except 1.25mg  on Mondays and Fridays.  Recheck in 1 week. Called [facility] for [place] with verbal orders to recheck on 09/07/09. Cloyde Reams,  RN, BSN  August 31, 2009 4:41 PM

## 2010-04-10 NOTE — Assessment & Plan Note (Signed)
Summary: new pt resection of tka,duration of iv   Visit Type:  Follow-up Referring Provider:  Dr. Charlann Boxer Primary Provider:  Benedetto Goad, MD  CC:  new  patient .  History of Present Illness: 71 yo man who had prior infection of left TKA in 2005 apparently managed by Dr. Roxan Hockey at that time. According to the wife and the pt had infection due to a cat scratch. 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 Roxan Hockey or of isolation of Pasteurella from culture. IN any case he had recurrent infection in his left knee this July after he had bout of cellulitis that originated with ulcers in lower extremity. He was treated by the hospitalist team with broad spectrum antibiotics. Ultimately he underwent I and D with resection arthroplasty and placement of antibiotic spacer on 09/28/09. Intraoperative cultures grew Diphtheroids, though one culture was initially read as CNS and the other as having GNR. He was placed on IV Vancomycin and treated for nearly 12 weeks with IV vancomycin and seen in Fu by Dr. Charlann Boxer who aspirated knee in September and encountered wbc of 1800 on aspiraate without any organisms growing. Pt presents for fu with our group today in clinic. He has been doing well without fevers or chills or nausea. He does continue to have open lesion on medial ankle that is weeping and decubitus ulcer on posterior heel. I spent greater than 45 minutes with this pt including greater than 50% of time spent face to face counselling.  Problems Prior to Update: 1)  Rbbb  (ICD-426.4) 2)  Carotid Artery Disease  (ICD-433.10) 3)  Fluid Overload  (ICD-276.6) 4)  Atrial Fibrillation, Hx of  (ICD-V12.59) 5)  Aaa  (ICD-441.4) 6)  Tobacco Abuse, Hx of  (ICD-V15.82) 7)  Dyslipidemia  (ICD-272.4) 8)  Ef 45%  () 9)  Cad  (ICD-414.00)  Medications Prior to Update: 1)  Coumadin 2.5 Mg Tabs (Warfarin Sodium) .... Take As Directed By Coumadin Clinic. 2)  Simvastatin 40 Mg Tabs  (Simvastatin) .... Take One Tablet By Mouth Daily At Bedtime 3)  Furosemide 20 Mg Tabs (Furosemide) .... Take One Tablet By Mouth Two Times A Day . 4)  Metolazone 2.5 Mg Tabs (Metolazone) .... Take One Tablet By Mouth Once Daily. 5)  Ramipril 5 Mg Caps (Ramipril) .... Take One Capsule By Mouth Daily 6)  Amaryl 4 Mg Tabs (Glimepiride) .... Take One Tablet By Mouth Once Daily. 7)  Metformin Hcl 1000 Mg Tabs (Metformin Hcl) .... Take One Tablet By Mouth Twice Daily. 8)  Spironolactone 25 Mg Tabs (Spironolactone) .... Take One Tablet By Mouth Two Times A Day . 9)  Aspirin 81 Mg Tbec (Aspirin) .... Take One Tablet By Mouth Daily  Current Medications (verified): 1)  Coumadin 2.5 Mg Tabs (Warfarin Sodium) .... Take As Directed By Coumadin Clinic. 2)  Simvastatin 40 Mg Tabs (Simvastatin) .... Take One Tablet By Mouth Daily At Bedtime 3)  Furosemide 20 Mg Tabs (Furosemide) .... Take One Tablet By Mouth Two Times A Day . 4)  Metolazone 2.5 Mg Tabs (Metolazone) .... Take One Tablet By Mouth Once Daily. 5)  Ramipril 5 Mg Caps (Ramipril) .... Take One Capsule By Mouth Daily 6)  Amaryl 4 Mg Tabs (Glimepiride) .... Take One Tablet By Mouth Once Daily. 7)  Metformin Hcl 1000 Mg Tabs (Metformin Hcl) .... Take One Tablet By Mouth Twice Daily. 8)  Spironolactone 25 Mg Tabs (Spironolactone) .... Take One Tablet By Mouth Two Times  A Day . 9)  Aspirin 81 Mg Tbec (Aspirin) .... Take One Tablet By Mouth Daily 10)  Doxycycline Hyclate 100 Mg Tabs (Doxycycline Hyclate) .... Take 1 Tablet By Mouth Two Times A Day  Allergies: 1)  ! Morphine 2)  ! Amoxicillin   Preventive Screening-Counseling & Management  Alcohol-Tobacco     Alcohol drinks/day: 0     Smoking Status: current     Packs/Day: 0.5  Caffeine-Diet-Exercise     Caffeine use/day: coffee,tea     Does Patient Exercise: no     Type of exercise: PT finished  Safety-Violence-Falls     Seat Belt Use: yes   Current Allergies (reviewed today): !  MORPHINE ! AMOXICILLIN Past History:  Past Medical History: Last updated: 11/09/2008 1. Hypertension.   2. Obesity.   cholecystectomy. 4. 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 old infarct/no ischemia 5. Ejection fraction of 45%...echo 2004 6. Hyperlipidemia, treated. 7. Cigarette use.  He is now smoking cigars and needs to quit. 8. History of knee difficulties and surgery. 9. History of prostate cancer with a seed implant and this is stable. 10.History of abdominal aortic aneurysm that is stable, being followed     by Dr. Liliane Bade. 11.Atrial fibrillation.  His rate is controlled.  He clearly is going     in and out of atrial fib.  He needs to be on Coumadin.  I have     discussed this with him and he is willing to be on Coumadin.  Volume overload carotid artery disease.... Doppler September 2 009 40-50% left Right bundle branch block noted November 09, 2008.  Risk Factors: Alcohol Use: 0 (12/19/2009) Caffeine Use: coffee,tea (12/19/2009) Exercise: no (12/19/2009)  Risk Factors: Smoking Status: current (12/19/2009) Packs/Day: 0.5 (12/19/2009)  Social History: married, accompanied by wife  Review of Systems       The patient complains of suspicious skin lesions.  The patient denies anorexia, fever, weight loss, weight gain, vision loss, decreased hearing, hoarseness, chest pain, syncope, dyspnea on exertion, peripheral edema, prolonged cough, headaches, hemoptysis, abdominal pain, melena, hematochezia, severe indigestion/heartburn, hematuria, incontinence, genital sores, muscle weakness, transient blindness, difficulty walking, depression, unusual weight change, abnormal bleeding, and enlarged lymph nodes.    Vital Signs:  Patient profile:   70 year old male Height:      70 inches (177.80 cm) Weight:      0 pounds (0 kg) BMI:     0 Temp:     97.5 degrees F (36.39 degrees C) oral Pulse rate:   62 / minute BP sitting:   125 / 74   (right arm)  Vitals Entered By: Baxter Hire) (December 19, 2009 1:46 PM) CC: new  patient  Pain Assessment Patient in pain? no      Nutritional Status Detail appetite is okay per patient  Does patient need assistance? Functional Status Self care Ambulation Normal Comments patient uses wheelchair at home as well   Physical Exam  General:  alert, well-nourished, and well-hydrated.   Head:  normocephalic, atraumatic, and no abnormalities observed.   Eyes:  vision grossly intact, pupils equal, and pupils round.   Ears:  no external deformities.   Nose:  no external erythema and no nasal discharge.   Mouth:  pharynx pink and moist and no erythema.   Neck:  supple and full ROM.   Lungs:  normal respiratory effort, no intercostal retractions, no accessory muscle use, and normal breath sounds.   Heart:  normal rate, regular rhythm, no murmur, no gallop, and no rub.   Abdomen:  soft, non-tender, and normal bowel sounds.   Msk:  see skin Neurologic:  alert & oriented X3.   Skin:  he has bilateraly venous stasis changes to skin, his left knee is swollen and slightly warm to palpation. his ankle has medial open ulcer that is moist with small amt of purulence that was cultured. POsterior decubitus not purulent Psych:  Oriented X3, memory intact for recent and remote, and normally interactive.     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 Wawrzyniak to oral doxycyline which should be active vs diptheroids, CNS and MRSA, MSSA and strep. It would actually also typically have activity vs pasteurella--though not drug of choice. I will pull Dr Elder Negus old paper records His updated medication list for this problem includes:    Aspirin 81 Mg Tbec (Aspirin) .Marland Kitchen... Take one tablet by mouth daily    Doxycycline Hyclate 100 Mg Tabs (Doxycycline hyclate) .Marland Kitchen... Take 1 tablet by mouth two times a  day  Orders: T-Comprehensive Metabolic Panel [phone]) T-CBC w/Diff ([id number]) CRP, high sensitivity-[facility] ([id number]) T-Sed Rate (Automated) ([id number]) T-Culture, Wound ([id number]) Est. Patient Level V (44010)  Problem # 2:  CELLULITIS AND ABSCESS OF LEG EXCEPT FOOT (ICD-682.6) his chronic venous stasis and ulcers, PVD contributed to the cellulitis that may have precipitated the spread of infection to the knee. (if that recent knee infeciton is due to this and not for example persistence of pasteurella since 2005 (another possiblity) His updated medication list for this problem includes:    Aspirin 81 Mg Tbec (Aspirin) .Marland Kitchen... Take one tablet by mouth daily    Doxycycline Hyclate 100 Mg Tabs (Doxycycline hyclate) .Marland Kitchen... Take 1 tablet by mouth two times a day  Orders: Est. Patient Level V (27253)  Problem # 3:  DYSLIPIDEMIA (ICD-272.4)  His updated medication list for this problem includes:    Simvastatin 40 Mg Tabs (Simvastatin) .Marland Kitchen... Take one tablet by mouth daily at bedtime  Problem # 4:  PERIPHERAL VASCULAR DISEASE (ICD-443.9) apparently not a good bypass candidate per wife and pt relayin of Dr. Coralee Pesa assessment  Medications Added to Medication List This Visit: 1)  Doxycycline Hyclate 100 Mg Tabs (Doxycycline hyclate) .... Take 1 tablet by mouth two times a day  Patient Instructions: 1)  we will stop your IV antibiotics and start you on oral doxycyline 100mg  twice daily  2)  we will also check some blood work today and pull your old records 3)  rtc to see Dr. Daiva Eves Prescriptions: DOXYCYCLINE HYCLATE 100 MG TABS (DOXYCYCLINE HYCLATE) Take 1 tablet by mouth two times a day  #180 x 4   Entered and Authorized by:   Acey Lav MD   Signed by:   Paulette Blanch Dam MD on 12/19/2009   Method used:   Electronically to        [facility] (retail)       [address]       [place], [place]  [address]       Ph: [phone] or [phone]       Fax:  [phone]   RxID:   [id number] DOXYCYCLINE HYCLATE 100 MG TABS (DOXYCYCLINE HYCLATE) Take 1 tablet by mouth two times a day  #60 x 1   Entered and Authorized by:   Acey Lav MD   Signed by:  Acey Lav MD on 12/19/2009   Method used:   Electronically to        [facility] (retail)       [address]       [place], [place]  [address]       Ph: [phone] or [phone]       Fax: [phone]   RxID:   [id number] DOXYCYCLINE HYCLATE 100 MG TABS (DOXYCYCLINE HYCLATE) Take 1 tablet by mouth two times a day  #180 x 4   Entered and Authorized by:   Acey Lav MD   Signed by:   Paulette Blanch Dam MD on 12/19/2009   Method used:   Faxed to ...       [facility] (mail-order)             , [place]         Ph:        Fax: [phone]   RxID:   [id number]

## 2010-04-10 NOTE — Medication Information (Signed)
Summary: [facility] Clinic  Anticoagulant Therapy  Managed by: Bethena Midget, RN, BSN Referring MD: Willa Rough MD PCP: Benedetto Goad, MD Supervising MD: Shirlee Latch MD, Dalton Indication 1: Atrial Fibrillation (ICD-427.31) Lab Used: [facility] Site: [address] PT 31.0 INR POC 3.1 INR RANGE 2 - 3  Dietary changes: no    Health status changes: no    Bleeding/hemorrhagic complications: no    Recent/future hospitalizations: no    Any changes in medication regimen? no    Recent/future dental: no  Any missed doses?: no       Is patient compliant with meds? yes       Allergies: 1)  ! Morphine  Anticoagulation Management History:      His anticoagulation is being managed by telephone today.  Positive risk factors for bleeding include an age of 24 years or older.  The bleeding index is 'intermediate risk'.  Negative CHADS2 values include Age > 65 years old.  The start date was 11/24/2007.  His last INR was 2.7.  Prothrombin time is 31.0.  Anticoagulation responsible provider: Shirlee Latch MD, Dalton.  INR POC: 3.1.    Anticoagulation Management Assessment/Plan:      The patient's current anticoagulation dose is Coumadin 2.5 mg tabs: Take as directed by coumadin clinic..  The target INR is 2 - 3.  The next INR is due 09/18/2009.  Anticoagulation instructions were given to patient.  Results were reviewed/authorized by Bethena Midget, RN, BSN.  He was notified by Bethena Midget, RN, BSN.         Prior Anticoagulation Instructions: INR 4.3  Called spoke with pt advised to hold x 2 doses then resume same dosage 2.5mg  daily except 1.25mg  on Mondays and Fridays.  Recheck in 1 week. Called [facility] for [place] with verbal orders to recheck on 09/07/09. Cloyde Reams, RN, BSN  August 31, 2009 4:41 PM   Current Anticoagulation Instructions: INR 3.1 Today take 1.25mg s then 2.5mg s daily except 1.25mg s MWf. Recheck in 10 days. Orders sent to [facility].

## 2010-04-10 NOTE — Letter (Signed)
[facility]   Imported By: Marylou Mccoy 12/12/2009 11:19:33  _____________________________________________________________________  External Attachment:    Type:   Image     Comment:   External Document

## 2010-04-10 NOTE — Medication Information (Signed)
Summary: rov/td  Anticoagulant Therapy  Managed by: Bethena Midget, RN, BSN Referring MD: Willa Rough MD PCP: Benedetto Goad, MD Supervising MD: Shirlee Latch MD, Bethzaida Boord Indication 1: Atrial Fibrillation (ICD-427.31) Lab Used: [facility] Site: [facility] INR POC 2.6 INR RANGE 2 - 3  Dietary changes: no    Health status changes: no    Bleeding/hemorrhagic complications: no    Recent/future hospitalizations: no    Any changes in medication regimen? no    Recent/future dental: no  Any missed doses?: no       Is patient compliant with meds? yes       Allergies (verified): 1)  ! Morphine  Anticoagulation Management History:      The patient is taking warfarin and comes in today for a routine follow up visit.  Positive risk factors for bleeding include an age of 72 years or older.  The bleeding index is 'intermediate risk'.  Negative CHADS2 values include Age > 30 years old.  The start date was 11/24/2007.  His last INR was 2.5.  Anticoagulation responsible provider: Shirlee Latch MD, Prudie Guthridge.  INR POC: 2.6.  Cuvette Lot#: [id number].  Exp: 07/2010.    Anticoagulation Management Assessment/Plan:      The patient's current anticoagulation dose is Coumadin 2.5 mg tabs: Take as directed by coumadin clinic..  The target INR is 2 - 3.  The next INR is due 06/26/2009.  Anticoagulation instructions were given to patient.  Results were reviewed/authorized by Bethena Midget, RN, BSN.  He was notified by Bethena Midget, RN, BSN.         Prior Anticoagulation Instructions: INR = 2.4 The patient is to continue with the same dose of coumadin.  This dosage includes:  1 tab daily on Sun, Tue, Wed, Thur, Sat 1/2 tab daily on Mon, Fri  Current Anticoagulation Instructions: INR 2.6 Continue 1 pill everyday except 1/2 pill on Mondays and Fridays. REcheck in 4 weeks.

## 2010-04-10 NOTE — Medication Information (Signed)
Summary: Coumadin Clinic  Anticoagulant Therapy  Managed by: Weston Brass, PharmD Referring MD: Willa Rough MD PCP: Benedetto Goad, MD Supervising MD: Tenny Craw MD, Gunnar Fusi Indication 1: Atrial Fibrillation (ICD-427.31) Lab Used: [facility] Site: [address] PT 33.1 INR POC 3.3 INR RANGE 2 - 3  Dietary changes: no    Health status changes: no    Bleeding/hemorrhagic complications: no    Recent/future hospitalizations: yes       Details: in hospital about 2 weeks ago  Any changes in medication regimen? yes       Details: on bactrim x 4 more days   Recent/future dental: no  Any missed doses?: no       Is patient compliant with meds? yes       Allergies: 1)  ! Morphine  Anticoagulation Management History:      Positive risk factors for bleeding include an age of 72 years or older.  The bleeding index is 'intermediate risk'.  Negative CHADS2 values include Age > 72 years old.  The start date was 11/24/2007.  His last INR was 2.7.  Prothrombin time is 33.1.  Anticoagulation responsible provider: Tenny Craw MD, Gunnar Fusi.  INR POC: 3.3.  Exp: 10/2010.    Anticoagulation Management Assessment/Plan:      The patient's current anticoagulation dose is Coumadin 2.5 mg tabs: Take as directed by coumadin clinic..  The target INR is 2 - 3.  The next INR is due 09/25/2009.  Anticoagulation instructions were given to patient.  Results were reviewed/authorized by Weston Brass, PharmD.  He was notified by Weston Brass PharmD.         Prior Anticoagulation Instructions: INR 3.1 Today take 1.25mg s then 2.5mg s daily except 1.25mg s MWf. Recheck in 10 days. Orders sent to [facility].   Current Anticoagulation Instructions: INR 3.3  LMOM for pt. Weston Brass PharmD  September 18, 2009 1:45 PM  Spoke with pt.  Skip today's dose of Coumadin, take 1/2 tablet on Monday then resume same dose of 1 tablet every day except 1/2 tablet on Monday, Wednesday and Friday.  Recheck INR in 1 week.  Orders faxed to [facility].

## 2010-04-10 NOTE — Miscellaneous (Signed)
Summary: [facility] Orders   [facility] Orders   Imported By: Roderic Ovens 12/19/2009 15:55:31  _____________________________________________________________________  External Attachment:    Type:   Image     Comment:   External Document

## 2010-04-10 NOTE — Consult Note (Signed)
Summary: [facility]  [facility]   Imported By: Florinda Marker 12/27/2009 10:43:21  _____________________________________________________________________  External Attachment:    Type:   Image     Comment:   External Document

## 2010-04-10 NOTE — Medication Information (Signed)
Summary: [facility]ician[facility] Orders   [facility]ician[facility] Orders   Imported By: Roderic Ovens 09/02/2009 11:08:57  _____________________________________________________________________  External Attachment:    Type:   Image     Comment:   External Document

## 2010-04-10 NOTE — Medication Information (Signed)
Summary: Coumadin Clinic  Anticoagulant Therapy  Managed by: Weston Brass, PharmD Referring MD: Willa Rough MD PCP: Benedetto Goad, MD Supervising MD: Antoine Poche MD, Fayrene Fearing Indication 1: Atrial Fibrillation (ICD-427.31) Lab Used: [facility] [place] Site: [address] PT 19.7 INR POC 1.6 INR RANGE 2 - 3  Dietary changes: no    Health status changes: no    Bleeding/hemorrhagic complications: no    Recent/future hospitalizations: no    Any changes in medication regimen? no    Recent/future dental: no  Any missed doses?: no       Is patient compliant with meds? yes       Allergies: 1)  ! Morphine  Anticoagulation Management History:      His anticoagulation is being managed by telephone today.  Positive risk factors for bleeding include an age of 60 years or older.  The bleeding index is 'intermediate risk'.  Negative CHADS2 values include Age > 43 years old.  The start date was 11/24/2007.  His last INR was 2.7.  Prothrombin time is 19.7.  Anticoagulation responsible provider: Antoine Poche MD, Fayrene Fearing.  INR POC: 1.6.  Exp: 10/2010.    Anticoagulation Management Assessment/Plan:      The patient's current anticoagulation dose is Coumadin 2.5 mg tabs: Take as directed by coumadin clinic..  The target INR is 2 - 3.  The next INR is due 12/25/2009.  Anticoagulation instructions were given to home health nurse.  Results were reviewed/authorized by Weston Brass, PharmD.  He was notified by Weston Brass PharmD.         Prior Anticoagulation Instructions: INR 1.5  Called spoke with pt instructed to take 1 tablet today, then start taking 1 tablet daily except 1/2 tablet on Mondays, Wednesdays, and Fridays.  Recheck in 1 week.  Called spoke with Diane at [facility] gave verbal orders to recheck in 1 week.   Current Anticoagulation Instructions: INR 1.6  LMOM for pt. Weston Brass PharmD  December 18, 2009 10:10 AM   Spoke with pt.  Take 1 tablet today then increase dose to 1 tablet every  day except 1/2 tablet on Monday and Friday.  Recheck INR in 1 week.  Orders given to Diane, pt is seeing MD on Thursday and decision on whether [facility] will be extended or d/c will be made.  HH RN has been instructed to call us if discharging pt.

## 2010-04-10 NOTE — Medication Information (Signed)
Summary: Coumadin Clinic  Anticoagulant Therapy  Managed by: Weston Brass, PharmD Referring MD: Willa Rough MD PCP: Benedetto Goad, MD Supervising MD: Excell Seltzer MD, Casimiro Needle Indication 1: Atrial Fibrillation (ICD-427.31) Lab Used: [facility] [place] Site: [address] PT 33.6 INR POC 2.8 INR RANGE 2 - 3  Dietary changes: no    Health status changes: no    Bleeding/hemorrhagic complications: no    Recent/future hospitalizations: no    Any changes in medication regimen? no    Recent/future dental: no  Any missed doses?: no       Is patient compliant with meds? yes       Allergies: 1)  ! Morphine  Anticoagulation Management History:      His anticoagulation is being managed by telephone today.  Positive risk factors for bleeding include an age of 72 years or older.  The bleeding index is 'intermediate risk'.  Negative CHADS2 values include Age > 61 years old.  The start date was 11/24/2007.  His last INR was 2.7.  Prothrombin time is 33.6.  Anticoagulation responsible provider: Excell Seltzer MD, Casimiro Needle.  INR POC: 2.8.  Exp: 10/2010.    Anticoagulation Management Assessment/Plan:      The patient's current anticoagulation dose is Coumadin 2.5 mg tabs: Take as directed by coumadin clinic..  The target INR is 2 - 3.  The next INR is due 12/04/2009.  Anticoagulation instructions were given to home health nurse.  Results were reviewed/authorized by Weston Brass, PharmD.  He was notified by Weston Brass PharmD.         Prior Anticoagulation Instructions: INR 3.8 Skip today's dose then change dose 1.25mg s daily except 2.5mg s on T,T, and Sat. Recheck in one week. Orders given to [facility] nurse while at home of pt. She will inform us if they discontinue HH services prior to 11/27/09.   Current Anticoagulation Instructions: INR 2.8  Continue same dose of 1.25mg  daily except 2.5mg  on Tuesday, Thursday and Saturday.  Recheck INR in 1 week.  Orders given to [facility] with [facility] while in  pts home.

## 2010-04-10 NOTE — Medication Information (Signed)
Summary: rov/mlw  Anticoagulant Therapy  Managed by: Weston Brass, PharmD Referring MD: Willa Rough MD PCP: Benedetto Goad, MD Supervising MD: Ladona Ridgel MD, Sharlot Gowda Indication 1: Atrial Fibrillation (ICD-427.31) Lab Used: [facility] Site: [facility] INR POC 1.2 INR RANGE 2 - 3  Dietary changes: no    Health status changes: yes       Details: has wound on left heal; seeing wound center.  They have recommended multivitamins and protein bars   Bleeding/hemorrhagic complications: yes       Details: pt noticed BRB during bowel movements last week and held his Coumadin x 3 days.  He did not let anyone know about this but then restarted his Coumadin on Sunday.  He has not had any other problems.   Recent/future hospitalizations: no    Any changes in medication regimen? no    Recent/future dental: no  Any missed doses?: yes     Details: missed at least 3 doses last week.   Is patient compliant with meds? yes       Allergies: 1)  ! Morphine  Anticoagulation Management History:      The patient is taking warfarin and comes in today for a routine follow up visit.  Positive risk factors for bleeding include an age of 72 years or older.  The bleeding index is 'intermediate risk'.  Negative CHADS2 values include Age > 53 years old.  The start date was 11/24/2007.  His last INR was 2.7.  Anticoagulation responsible provider: Ladona Ridgel MD, Sharlot Gowda.  INR POC: 1.2.  Cuvette Lot#: [id number].  Exp: 10/2010.    Anticoagulation Management Assessment/Plan:      The patient's current anticoagulation dose is Coumadin 2.5 mg tabs: Take as directed by coumadin clinic..  The target INR is 2 - 3.  The next INR is due 08/16/2009.  Anticoagulation instructions were given to patient.  Results were reviewed/authorized by Weston Brass, PharmD.  He was notified by Weston Brass PharmD.         Prior Anticoagulation Instructions: INR 1.8  Please continue to hold warfarin as directed. Restart Coumadin as directed. Restart same  dose of 1 tablet daily (2.5 mg), except 1/2 tablet on Mondays and Fridays.   Next appointment Tuesday, May 31st at 12:30.       Current Anticoagulation Instructions: INR 1.2  Take 1 1/2 tablets today and tomorrow then resume same dose of 1 tablet every day except 1/2 tablet on Monday and Friday

## 2010-04-10 NOTE — Progress Notes (Signed)
Summary: pt needs something cheaper than lipitor  Phone Note Call from Patient Call back at [phone]   Caller: Patient Reason for Call: Talk to Nurse, Talk to Doctor Summary of Call: pt cant afford lipitor and wanted to know if there is something else he can take cheaper Initial call taken by: Omer Jack,  June 02, 2009 4:07 PM  Follow-up for Phone Call        Left message to call back Meredith Staggers, RN  June 02, 2009 5:26 PM    per pt wife calling in regarding to lipitor [phone] Lorne Skeens  June 05, 2009 2:46 PM   Additional Follow-up for Phone Call Additional follow up Details #1::        pt's wife calling about changing lipitor to something cheaper--will have to pay 200.00 per month to get it on his plan--have very few tablets  left so needs to be done soon--i told pt's wife heather s nor dr Myrtis Ser here today, but we will phone her 3/29 with some replacement if possible--wife agreed--will let heather s know--nt Additional Follow-up by: Ledon Snare, RN,  June 05, 2009 3:37 PM     Appended Document: pt needs something cheaper than lipitor Did something get taken care of?  Appended Document: pt needs something cheaper than lipitor no, please advise what pt can change lipitor too?  Appended Document: pt needs something cheaper than lipitor per Dr Myrtis Ser ok to change to simvastatin 40mg  daily, pts wife aware, new rx sent to [facility]   Clinical Lists Changes  Medications: Changed medication from LIPITOR 40 MG TABS (ATORVASTATIN CALCIUM) Take one-half tablet by mouth daily. to SIMVASTATIN 40 MG TABS (SIMVASTATIN) Take one tablet by mouth daily at bedtime - Signed Rx of SIMVASTATIN 40 MG TABS (SIMVASTATIN) Take one tablet by mouth daily at bedtime;  #90 x 3;  Signed;  Entered by: Meredith Staggers, RN;  Authorized by: Talitha Givens, MD, [facility];  Method used: Electronically to [facility]*, , ,   , Ph: [phone], Fax: [phone]    Prescriptions: SIMVASTATIN 40 MG  TABS (SIMVASTATIN) Take one tablet by mouth daily at bedtime  #90 x 3   Entered by:   Meredith Staggers, RN   Authorized by:   Talitha Givens, MD, [facility]   Signed by:   Meredith Staggers, RN on 06/08/2009   Method used:   Electronically to        [facility]* (mail-order)             ,          Ph: [phone]       Fax: [phone]   RxID:   [id number]

## 2010-04-10 NOTE — Medication Information (Signed)
Summary: Coumadin Clinic  Anticoagulant Therapy  Managed by: Cloyde Reams, RN, BSN Referring MD: Willa Rough MD PCP: Benedetto Goad, MD Supervising MD: Antoine Poche MD, Fayrene Fearing Indication 1: Atrial Fibrillation (ICD-427.31) Lab Used: [facility] Site: [address] PT 19.9 INR POC 2.0 INR RANGE 2 - 3  Dietary changes: no    Health status changes: no    Bleeding/hemorrhagic complications: yes       Details: Pt states he has been in the hospital 7/14-7/16.    Any changes in medication regimen? no     Any missed doses?: yes     Details: Unsure if missed any doses of coumadin while in the hospital.    Is patient compliant with meds? yes      Comments: Continues on Bactrim, x 10 more days.    Allergies: 1)  ! Morphine  Anticoagulation Management History:      His anticoagulation is being managed by telephone today.  Positive risk factors for bleeding include an age of 65 years or older.  The bleeding index is 'intermediate risk'.  Negative CHADS2 values include Age > 10 years old.  The start date was 11/24/2007.  His last INR was 2.7.  Prothrombin time is 19.9.  Anticoagulation responsible provider: Antoine Poche MD, Fayrene Fearing.  INR POC: 2.0.  Exp: 10/2010.    Anticoagulation Management Assessment/Plan:      The patient's current anticoagulation dose is Coumadin 2.5 mg tabs: Take as directed by coumadin clinic..  The target INR is 2 - 3.  The next INR is due 10/02/2009.  Anticoagulation instructions were given to patient.  Results were reviewed/authorized by Cloyde Reams, RN, BSN.  He was notified by Cloyde Reams RN.         Prior Anticoagulation Instructions: INR 3.3  LMOM for pt. Weston Brass PharmD  September 18, 2009 1:45 PM  Spoke with pt.  Skip today's dose of Coumadin, take 1/2 tablet on Monday then resume same dose of 1 tablet every day except 1/2 tablet on Monday, Wednesday and Friday.  Recheck INR in 1 week.  Orders fax[facility] to [facility].    Current Anticoagulation Instructions: INR  2.0  Called spoke with pt advised to continue on same dosage 1 tablet daily except 1/2 tablet on Mondays, Wednesdays, and Fridays.  Recheck in 1 week.  Faxed orders back to [place].

## 2010-04-10 NOTE — Miscellaneous (Signed)
Summary: [facility]   [facility]   Imported By: Roderic Ovens 02/15/2010 08:48:03  _____________________________________________________________________  External Attachment:    Type:   Image     Comment:   External Document

## 2010-04-10 NOTE — Letter (Signed)
Summary: [facility]  [facility]   Imported By: Marylou Mccoy 12/12/2009 11:18:59  _____________________________________________________________________  External Attachment:    Type:   Image     Comment:   External Document

## 2010-04-10 NOTE — Letter (Signed)
Summary: [facility]  [facility]   Imported By: Marylou Mccoy 10/04/2009 10:59:27  _____________________________________________________________________  External Attachment:    Type:   Image     Comment:   External Document

## 2010-04-10 NOTE — Medication Information (Signed)
Summary: Coumadin Clinic  Anticoagulant Therapy  Managed by: Weston Brass, PharmD Referring MD: Willa Rough MD PCP: Benedetto Goad, MD Supervising MD: Riley Kill MD, Maisie Fus Indication 1: Atrial Fibrillation (ICD-427.31) Lab Used: [facility] Site: [address] PT 26.8 INR POC 2.7 INR RANGE 2 - 3  Dietary changes: no    Health status changes: no    Bleeding/hemorrhagic complications: no    Recent/future hospitalizations: no    Any changes in medication regimen? yes       Details: Started Cipro x 7 days last night   Recent/future dental: no  Any missed doses?: no       Is patient compliant with meds? yes       Allergies: 1)  ! Morphine 2)  ! Amoxicillin  Anticoagulation Management History:      The patient is taking warfarin and comes in today for a routine follow up visit.  Positive risk factors for bleeding include an age of 72 years or older.  The bleeding index is 'intermediate risk'.  Negative CHADS2 values include Age > 73 years old.  The start date was 11/24/2007.  His last INR was 2.7.  Prothrombin time is 26.8.  Anticoagulation responsible provider: Riley Kill MD, Maisie Fus.  INR POC: 2.7.  Exp: 10/2010.    Anticoagulation Management Assessment/Plan:      The patient's current anticoagulation dose is Coumadin 2.5 mg tabs: Take as directed by coumadin clinic..  The target INR is 2 - 3.  The next INR is due 01/17/2010.  Anticoagulation instructions were given to home health nurse.  Results were reviewed/authorized by Weston Brass, PharmD.  He was notified by Weston Brass PharmD.         Prior Anticoagulation Instructions: INR 2.7  Spoke with pt's wife.  Continue same dose of 1 tablet every day except 1/2 tablet on Monday, Wednesday and Friday.  Recheck INR in 2 weeks. Orders faxed to [facility].   Current Anticoagulation Instructions: INR 2.7  Spoke with pt.  Continue same dose of 1 tablet every day except 1/2 tablet on Monday, Wednesday and Friday.  Recheck INR in 1 week.   Orders faxed to [facility].

## 2010-04-10 NOTE — Medication Information (Signed)
Summary: Coumadin Clinic  Anticoagulant Therapy  Managed by: Cloyde Reams, RN, BSN Referring MD: Willa Rough MD PCP: Benedetto Goad, MD Supervising MD: Graciela Husbands MD, Viviann Spare Indication 1: Atrial Fibrillation (ICD-427.31) Lab Used: [facility] Site: [address] PT 32.0 INR POC 2.7 INR RANGE 2 - 3  Dietary changes: no     Bleeding/hemorrhagic complications: no     Any changes in medication regimen? no     Any missed doses?: no       Is patient compliant with meds? yes       Allergies: 1)  ! Morphine  Anticoagulation Management History:      His anticoagulation is being managed by telephone today.  Positive risk factors for bleeding include an age of 72 years or older.  The bleeding index is 'intermediate risk'.  Negative CHADS2 values include Age > 18 years old.  The start date was 11/24/2007.  His last INR was 2.7.  Prothrombin time is 32.0.  Anticoagulation responsible provider: Graciela Husbands MD, Viviann Spare.  INR POC: 2.7.  Exp: 10/2010.    Anticoagulation Management Assessment/Plan:      The patient's current anticoagulation dose is Coumadin 2.5 mg tabs: Take as directed by coumadin clinic..  The target INR is 2 - 3.  The next INR is due 11/13/2009.  Anticoagulation instructions were given to patient.  Results were reviewed/authorized by Cloyde Reams, RN, BSN.  He was notified by Cloyde Reams RN.         Prior Anticoagulation Instructions: INR 1.86  Spoke with pt.  Take extra 1/2 tablet  today then change dose to 1 tablet every day except 1/2 tablet on Monday and Friday.  Recheck INR in 1 week.  Orders given to [facility] with [facility].  Current Anticoagulation Instructions: INR 2.7  Attempted to call pt with results.  LMOM TCB for results. Cloyde Reams RN  November 02, 2009 2:47 PM  Spoke with pt's wife, advised to continue on same dosage 2.5mg  daily except 1.25mg  on MOndays and Fridays.  Recheck in 10 days. Called [facility] spoke with Lynden Ang, gave verbal orders to redraw on 11/13/09. Cloyde Reams RN  November 02, 2009 3:22 PM

## 2010-04-10 NOTE — Medication Information (Signed)
Summary: Coumadin Clinic  Anticoagulant Therapy  Managed by: Cloyde Reams, RN, BSN Referring MD: Willa Rough MD PCP: Benedetto Goad, MD Supervising MD: Gala Romney MD, Reuel Boom Indication 1: Atrial Fibrillation (ICD-427.31) Lab Used: [facility] Site: [address] PT 18.0 INR POC 1.5 INR RANGE 2 - 3  Dietary changes: no    Health status changes: no    Bleeding/hemorrhagic complications: no    Recent/future hospitalizations: no    Any changes in medication regimen? no    Recent/future dental: no  Any missed doses?: no       Is patient compliant with meds? yes       Allergies: 1)  ! Morphine  Anticoagulation Management History:      The patient is taking warfarin and comes in today for a routine follow up visit.  Positive risk factors for bleeding include an age of 72 years or older.  The bleeding index is 'intermediate risk'.  Negative CHADS2 values include Age > 72 years old.  The start date was 11/24/2007.  His last INR was 2.7.  Prothrombin time is 18.0.  Anticoagulation responsible provider: Bensimhon MD, Reuel Boom.  INR POC: 1.5.  Exp: 10/2010.    Anticoagulation Management Assessment/Plan:      The patient's current anticoagulation dose is Coumadin 2.5 mg tabs: Take as directed by coumadin clinic..  The target INR is 2 - 3.  The next INR is due 12/18/2009.  Anticoagulation instructions were given to home health nurse.  Results were reviewed/authorized by Cloyde Reams, RN, BSN.  He was notified by Cloyde Reams RN.         Prior Anticoagulation Instructions: INR 2.0  Spoke with pt.  Continue same dose of 1/2 tablet every day except 1 tablet on Tuesday, Thursday and Saturday.  Recheck INR in 1 week.  Orders given to Terri with [facility].   Current Anticoagulation Instructions: INR 1.5  Called spoke with pt instructed to take 1 tablet today, then start taking 1 tablet daily except 1/2 tablet on Mondays, Wednesdays, and Fridays.  Recheck in 1 week.  Called  spoke with Diane at [facility] gave verbal orders to recheck in 1 week.

## 2010-04-10 NOTE — Medication Information (Signed)
Summary: Coumadin Clinic  Anticoagulant Therapy  Managed by: Bethena Midget, RN, BSN Referring MD: Willa Rough MD PCP: Benedetto Goad, MD Supervising MD: Johney Frame MD, Fayrene Fearing Indication 1: Atrial Fibrillation (ICD-427.31) Lab Used: [facility] Site: [address] PT 45.5 INR POC 3.8 INR RANGE 2 - 3  Dietary changes: no    Health status changes: no    Bleeding/hemorrhagic complications: no    Recent/future hospitalizations: no    Any changes in medication regimen? yes       Details: Continue on Vanco IV   Recent/future dental: no  Any missed doses?: no       Is patient compliant with meds? yes       Allergies: 1)  ! Morphine  Anticoagulation Management History:      His anticoagulation is being managed by telephone today.  Positive risk factors for bleeding include an age of 72 years or older.  The bleeding index is 'intermediate risk'.  Negative CHADS2 values include Age > 15 years old.  The start date was 11/24/2007.  His last INR was 2.7.  Prothrombin time is 45.5.  Anticoagulation responsible provider: Shain Pauwels MD, Fayrene Fearing.  INR POC: 3.8.    Anticoagulation Management Assessment/Plan:      The patient's current anticoagulation dose is Coumadin 2.5 mg tabs: Take as directed by coumadin clinic..  The target INR is 2 - 3.  The next INR is due 11/27/2009.  Anticoagulation instructions were given to home health nurse.  Results were reviewed/authorized by Bethena Midget, RN, BSN.  He was notified by Bethena Midget, RN, BSN.         Prior Anticoagulation Instructions: INR 3.46 Today take 1.25mg s then change dose to 2.5mg s daily except 1.25mg s on MWF. Recheck in one week. Orders given to Lenon Curt at [facility].   Current Anticoagulation Instructions: INR 3.8 Skip today's dose then change dose 1.25mg s daily except 2.5mg s on T,T, and Sat. Recheck in one week. Orders given to [facility] nurse while at home of pt. She will inform us if they discontinue HH services prior  to 11/27/09.

## 2010-04-10 NOTE — Miscellaneous (Signed)
Summary: HIPAA Restrictions  HIPAA Restrictions   Imported By: Florinda Marker 12/21/2009 09:06:16  _____________________________________________________________________  External Attachment:    Type:   Image     Comment:   External Document

## 2010-04-10 NOTE — Medication Information (Signed)
Summary: Coumadin Clinic  Anticoagulant Therapy  Managed by: Bethena Midget, RN, BSN Referring MD: Willa Rough MD PCP: Benedetto Goad, MD Supervising MD: Eden Emms MD, Theron Arista Indication 1: Atrial Fibrillation (ICD-427.31) Lab Used: [facility] Site: [address] PT 21.7 INR POC 2.2 INR RANGE 2 - 3  Dietary changes: no    Health status changes: no    Bleeding/hemorrhagic complications: no    Recent/future hospitalizations: no    Any changes in medication regimen? yes       Details: Metformin decreased and glipizide discontinued   Recent/future dental: no  Any missed doses?: no       Is patient compliant with meds? yes       Allergies: 1)  ! Morphine 2)  ! Amoxicillin  Anticoagulation Management History:      His anticoagulation is being managed by telephone today.  Positive risk factors for bleeding include an age of 14 years or older.  The bleeding index is 'intermediate risk'.  Negative CHADS2 values include Age > 4 years old.  The start date was 11/24/2007.  His last INR was 2.7.  Prothrombin time is 21.7.  Anticoagulation responsible provider: Eden Emms MD, Theron Arista.  INR POC: 2.2.    Anticoagulation Management Assessment/Plan:      The patient's current anticoagulation dose is Coumadin 2.5 mg tabs: Take as directed by coumadin clinic..  The target INR is 2 - 3.  The next INR is due 01/31/2010.  Anticoagulation instructions were given to patient.  Results were reviewed/authorized by Bethena Midget, RN, BSN.  He was notified by Bethena Midget, RN, BSN.         Prior Anticoagulation Instructions: INR 2.7  Spoke with pt.  Continue same dose of 1 tablet every day except 1/2 tablet on Monday, Wednesday and Friday.  Recheck INR in 1 week.  Orders faxed to [facility].   Current Anticoagulation Instructions: INR 2.2 Continue 2.5mg s daily except 1.25mg s on MWF.  Recheck in  2 weeks. Orders sent to [facility].

## 2010-04-10 NOTE — Miscellaneous (Signed)
Summary: [facility]   [facility] Orders   Imported By: Roderic Ovens 11/22/2009 10:58:22  _____________________________________________________________________  External Attachment:    Type:   Image     Comment:   External Document

## 2010-04-10 NOTE — Medication Information (Signed)
Summary: rov/sp  Anticoagulant Therapy  Managed by: Bethena Midget, RN, BSN Referring MD: Willa Rough MD PCP: Benedetto Goad, MD Supervising MD: Juanda Chance MD, Timohty Renbarger Indication 1: Atrial Fibrillation (ICD-427.31) Lab Used: [facility] Site: [facility] INR POC 1.5 INR RANGE 2 - 3  Dietary changes: no    Health status changes: no    Bleeding/hemorrhagic complications: no    Recent/future hospitalizations: no    Any changes in medication regimen? no    Recent/future dental: no  Any missed doses?: no       Is patient compliant with meds? yes       Allergies: 1)  ! Morphine  Anticoagulation Management History:      The patient is taking warfarin and comes in today for a routine follow up visit.  Positive risk factors for bleeding include an age of 72 years or older.  The bleeding index is 'intermediate risk'.  Negative CHADS2 values include Age > 72 years old.  The start date was 11/24/2007.  His last INR was 2.7.  Anticoagulation responsible provider: Juanda Chance MD, Smitty Cords.  INR POC: 1.5.  Cuvette Lot#: [id number].  Exp: 10/2010.    Anticoagulation Management Assessment/Plan:      The patient's current anticoagulation dose is Coumadin 2.5 mg tabs: Take as directed by coumadin clinic..  The target INR is 2 - 3.  The next INR is due 08/23/2009.  Anticoagulation instructions were given to patient.  Results were reviewed/authorized by Bethena Midget, RN, BSN.  He was notified by Bethena Midget, RN, BSN.         Prior Anticoagulation Instructions: INR 1.2  Take 1 1/2 tablets today and tomorrow then resume same dose of 1 tablet every day except 1/2 tablet on Monday and Friday   Current Anticoagulation Instructions: INR 1.5 Today take 1 1/2 pills and Thursday, then resume 1 pill everyday except 1/2 pill on Mondays and Fridays. Recheck in one week.

## 2010-04-10 NOTE — Letter (Signed)
Summary: [facility] Office Visit Note   [facility] Visit Note   Imported By: Roderic Ovens 09/12/2009 12:30:40  _____________________________________________________________________  External Attachment:    Type:   Image     Comment:   External Document

## 2010-04-10 NOTE — Medication Information (Signed)
Summary: ROV/LB  Anticoagulant Therapy  Managed by: Loma Newton Referring MD: Willa Rough MD PCP: Benedetto Goad MD Supervising MD: Antoine Poche MD, Fayrene Fearing Indication 1: Atrial Fibrillation (ICD-427.31) Lab Used: [facility] Site: [facility] INR POC 2.5 INR RANGE 2 - 3  Dietary changes: no    Health status changes: no    Bleeding/hemorrhagic complications: no    Recent/future hospitalizations: no    Any changes in medication regimen? yes       Details: Taking doxycycline and oxycodone for diabetic leg infection  Recent/future dental: no  Any missed doses?: no       Is patient compliant with meds? yes       Current Medications (verified): 1)  Coumadin 2.5 Mg Tabs (Warfarin Sodium) .... Take As Directed By Coumadin Clinic. 2)  Lipitor 20 Mg Tabs (Atorvastatin Calcium) .... Take One Tablet By Mouth Daily. 3)  Furosemide 20 Mg Tabs (Furosemide) .... Take One Tablet By Mouth Two Times A Day . 4)  Metolazone 2.5 Mg Tabs (Metolazone) .... Take One Tablet By Mouth Once Daily. 5)  Ramipril 5 Mg Caps (Ramipril) .... Take One Capsule By Mouth Daily 6)  Amaryl 4 Mg Tabs (Glimepiride) .... Take One Tablet By Mouth Once Daily. 7)  Metformin Hcl 1000 Mg Tabs (Metformin Hcl) .... Take One Tablet By Mouth Twice Daily. 8)  Spironolactone 25 Mg Tabs (Spironolactone) .... Take One Tablet By Mouth Two Times A Day .  Allergies: 1)  ! Morphine  Anticoagulation Management History:      The patient is taking warfarin and comes in today for a routine follow up visit.  Positive risk factors for bleeding include an age of 12 years or older.  The bleeding index is 'intermediate risk'.  Negative CHADS2 values include Age > 43 years old.  The start date was 11/24/2007.  His last INR was 8.9 RATIO and today's INR is 2.5.  Anticoagulation responsible provider: Antoine Poche MD, Fayrene Fearing.  INR POC: 2.5.  Cuvette Lot#: [id number].  Exp: 04/03/2009.    Anticoagulation Management Assessment/Plan:      The patient's  current anticoagulation dose is Coumadin 2.5 mg tabs: Take as directed by coumadin clinic..  The target INR is 2 - 3.  The next INR is due 05/01/2009.  Anticoagulation instructions were given to patient.  Results were reviewed/authorized by Loma Newton.  He was notified by Loma Newton.         Prior Anticoagulation Instructions: INR 2.7 CONTINUE TAKING 1 TABLET EVERYDAY EXCEPT TAKE 0.5 TABLETS ON MONDAYS AND FRIDAYS   Current Anticoagulation Instructions: INR=2.5   The patient is to continue with the same dose of coumadin.  This dosage includes: 1 tablet every day except for on monday and friday take 0.5 tabs

## 2010-04-10 NOTE — Medication Information (Signed)
Summary: rov mb  Anticoagulant Therapy  Managed by: Lynann Bologna Referring MD: Willa Rough MD PCP: Benedetto Goad, MD Supervising MD: Juanda Chance MD, Bruce Indication 1: Atrial Fibrillation (ICD-427.31) Lab Used: [facility] Site: [facility] INR POC 1.8 INR RANGE 2 - 3  Dietary changes: no    Health status changes: no    Bleeding/hemorrhagic complications: no    Recent/future hospitalizations: yes       Details: Patient is to have biopsy for prostate cancer on Wednesday and peripheral angiography on Thursday.   Any changes in medication regimen? yes       Details: Off coumadin since Friday, May 13th.   Recent/future dental: no  Any missed doses?: yes       Is patient compliant with meds? yes       Current Medications (verified): 1)  Coumadin 2.5 Mg Tabs (Warfarin Sodium) .... Take As Directed By Coumadin Clinic. 2)  Simvastatin 40 Mg Tabs (Simvastatin) .... Take One Tablet By Mouth Daily At Bedtime 3)  Furosemide 20 Mg Tabs (Furosemide) .... Take One Tablet By Mouth Two Times A Day . 4)  Metolazone 2.5 Mg Tabs (Metolazone) .... Take One Tablet By Mouth Once Daily. 5)  Ramipril 5 Mg Caps (Ramipril) .... Take One Capsule By Mouth Daily 6)  Amaryl 4 Mg Tabs (Glimepiride) .... Take One Tablet By Mouth Once Daily. 7)  Metformin Hcl 1000 Mg Tabs (Metformin Hcl) .... Take One Tablet By Mouth Twice Daily. 8)  Spironolactone 25 Mg Tabs (Spironolactone) .... Take One Tablet By Mouth Two Times A Day . 9)  Aspirin 81 Mg Tbec (Aspirin) .... Take One Tablet By Mouth Daily  Allergies (verified): 1)  ! Morphine  Anticoagulation Management History:      The patient is taking warfarin and comes in today for a routine follow up visit.  Positive risk factors for bleeding include an age of 17 years or older.  The bleeding index is 'intermediate risk'.  Negative CHADS2 values include Age > 38 years old.  The start date was 11/24/2007.  His last INR was 2.7.  Anticoagulation responsible provider:  Juanda Chance MD, Smitty Cords.  INR POC: 1.8.  Cuvette Lot#: [id number].  Exp: 10/2010.    Anticoagulation Management Assessment/Plan:      The patient's current anticoagulation dose is Coumadin 2.5 mg tabs: Take as directed by coumadin clinic..  The target INR is 2 - 3.  The next INR is due 08/07/2009.  Anticoagulation instructions were given to patient.  Results were reviewed/authorized by Lynann Bologna.  He was notified by Lynann Bologna.         Prior Anticoagulation Instructions: INR = 2.7 (goal = 2-3) Good job!   The patient is to continue with the same dose of coumadin.  This dosage includes: 1 tablet all days except monday and friday take half a tablet  Current Anticoagulation Instructions: INR 1.8  Please continue to hold warfarin as directed. Restart Coumadin as directed. Restart same dose of 1 tablet daily (2.5 mg), except 1/2 tablet on Mondays and Fridays.   Next appointment Tuesday, May 31st at 12:30.

## 2010-04-10 NOTE — Progress Notes (Signed)
Summary: NEED WRITTEN PRSCRIPTIONS  Phone Note Refill Request Message from:  Patient  Refills Requested: Medication #1:  FUROSEMIDE 20 MG TABS Take one tablet by mouth two times a day .  Medication #2:  METOLAZONE 2.5 MG TABS Take one tablet by mouth once daily.  Medication #3:  COUMADIN 2.5 MG TABS Take as directed by coumadin clinic. PT NEED A WRITTEN PRESCRIPTION FOR THESE MEDICATION CAN PICK UP TODAY PT HAVE APPT IN COUMADIN  AT 1:45  Initial call taken by: Judie Grieve,  Jul 24, 2009 10:13 AM    Prescriptions: METOLAZONE 2.5 MG TABS (METOLAZONE) Take one tablet by mouth once daily.  #90 x 3   Entered by:   Hardin Negus, RMA   Authorized by:   Talitha Givens, MD, [facility]   Signed by:   Hardin Negus, RMA on 07/24/2009   Method used:   Print then Give to Patient   RxID:   [id number] FUROSEMIDE 20 MG TABS (FUROSEMIDE) Take one tablet by mouth two times a day .  #180 x 3   Entered by:   Hardin Negus, RMA   Authorized by:   Talitha Givens, MD, [facility]   Signed by:   Hardin Negus, RMA on 07/24/2009   Method used:   Print then Give to Patient   RxID:   [id number] COUMADIN 2.5 MG TABS (WARFARIN SODIUM) Take as directed by coumadin clinic.  #90 x 3   Entered by:   Hardin Negus, RMA   Authorized by:   Talitha Givens, MD, [facility]   Signed by:   Hardin Negus, RMA on 07/24/2009   Method used:   Print then Give to Patient   RxID:   [id number]   Appended Document: NEED WRITTEN PRSCRIPTIONS    Clinical Lists Changes  Medications: Rx of COUMADIN 2.5 MG TABS (WARFARIN SODIUM) Take as directed by coumadin clinic.;  #90 x 3;  Signed;  Entered by: Hardin Negus, RMA;  Authorized by: Verne Carrow, MD;  Method used: Print then Give to Patient Rx of FUROSEMIDE 20 MG TABS (FUROSEMIDE) Take one tablet by mouth two times a day .;  #180 x 3;  Signed;  Entered by: Hardin Negus, RMA;  Authorized by: Verne Carrow, MD;  Method used: Print then  Give to Patient Rx of METOLAZONE 2.5 MG TABS (METOLAZONE) Take one tablet by mouth once daily.;  #90 x 3;  Signed;  Entered by: Hardin Negus, RMA;  Authorized by: Verne Carrow, MD;  Method used: Print then Give to Patient    Prescriptions: METOLAZONE 2.5 MG TABS (METOLAZONE) Take one tablet by mouth once daily.  #90 x 3   Entered by:   Hardin Negus, RMA   Authorized by:   Verne Carrow, MD   Signed by:   Hardin Negus, RMA on 07/24/2009   Method used:   Print then Give to Patient   RxID:   [id number] FUROSEMIDE 20 MG TABS (FUROSEMIDE) Take one tablet by mouth two times a day .  #180 x 3   Entered by:   Hardin Negus, RMA   Authorized by:   Verne Carrow, MD   Signed by:   Hardin Negus, RMA on 07/24/2009   Method used:   Print then Give to Patient   RxID:   [id number] COUMADIN 2.5 MG TABS (WARFARIN SODIUM) Take as directed by coumadin clinic.  #90 x 3   Entered by:   Hardin Negus, RMA   Authorized by:   Verne Carrow, MD  Signed by:   Hardin Negus, RMA on 07/24/2009   Method used:   Print then Give to Patient   RxID:   [id number]

## 2010-04-10 NOTE — Assessment & Plan Note (Signed)
Summary: f25m   Visit Type:  Follow-up Primary Provider:  Benedetto Goad, MD  CC:  CAD.  History of Present Illness: Patient is seen for followup of coronary disease.  He's had other medical problems that have been very nicely managed by Dr.Wilson.  His cardiac status has actually been stable.  His atrial fib is controlled.  He is on Coumadin.  He's not having any chest pain.  Current Medications (verified): 1)  Coumadin 2.5 Mg Tabs (Warfarin Sodium) .... Take As Directed By Coumadin Clinic. 2)  Lipitor 40 Mg Tabs (Atorvastatin Calcium) .... Take One-Half Tablet By Mouth Daily. 3)  Furosemide 20 Mg Tabs (Furosemide) .... Take One Tablet By Mouth Two Times A Day . 4)  Metolazone 2.5 Mg Tabs (Metolazone) .... Take One Tablet By Mouth Once Daily. 5)  Ramipril 5 Mg Caps (Ramipril) .... Take One Capsule By Mouth Daily 6)  Amaryl 4 Mg Tabs (Glimepiride) .... Take One Tablet By Mouth Once Daily. 7)  Metformin Hcl 1000 Mg Tabs (Metformin Hcl) .... Take One Tablet By Mouth Twice Daily. 8)  Spironolactone 25 Mg Tabs (Spironolactone) .... Take One Tablet By Mouth Two Times A Day . 9)  Aspirin 81 Mg Tbec (Aspirin) .... Take One Tablet By Mouth Daily  Allergies (verified): 1)  ! Morphine  Past History:  Past Medical History: Last updated: 11/09/2008 1. Hypertension.   2. Obesity.   cholecystectomy. 4. 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 old infarct/no ischemia 5. Ejection fraction of 45%...echo 2004 6. Hyperlipidemia, treated. 7. Cigarette use.  He is now smoking cigars and needs to quit. 8. History of knee difficulties and surgery. 9. History of prostate cancer with a seed implant and this is stable. 10.History of abdominal aortic aneurysm that is stable, being followed     by Dr. Liliane Bade. 11.Atrial fibrillation.  His rate is controlled.  He clearly is going     in and out of atrial fib.  He needs to be on Coumadin.  I have     discussed this  with him and he is willing to be on Coumadin.  Volume overload carotid artery disease.... Doppler September 2 009 40-50% left Right bundle branch block noted November 09, 2008.  Review of Systems       Patient denies fever, chills, headache, sweats, rash, change in vision, change in hearing, chest pain, cough, nausea vomiting, urinary symptoms.  All other systems are reviewed and are negative.  Vital Signs:  Patient profile:   70 year old male Height:      70 inches Weight:      266 pounds BMI:     38.30 Pulse rate:   86 / minute BP sitting:   126 / 71  (left arm) Cuff size:   regular  Vitals Entered By: Burnett Kanaris, CNA (May 16, 2009 3:17 PM)  Physical Exam  General:  patient is overweight but he has lost weight since his last visit. Eyes:  no xanthelasma. Neck:  no jugular venous distention. Lungs:  lungs are clear.  Respiratory effort is not labored. Heart:  cardiac exam reveals S1-S2.  No clicks or significant murmurs. Abdomen:  abdomen is soft but protuberant. Extremities:  trace peripheral edema. Psych:  patient is oriented to person time and place.  Affect is normal.   Impression & Recommendations:  Problem # 1:  CAROTID ARTERY DISEASE (ICD-433.10)  His updated medication list for this problem includes:    Coumadin 2.5 Mg  Tabs (Warfarin sodium) .Marland Kitchen... Take as directed by coumadin clinic.    Aspirin 81 Mg Tbec (Aspirin) .Marland Kitchen... Take one tablet by mouth daily patient's carotids were checked in October of 2010.they're stable.  Followup in one year.  Problem # 2:  FLUID OVERLOAD (ICD-276.6) Fluid status is stable.  No change in therapy.  Problem # 3:  ATRIAL FIBRILLATION, HX OF (ICD-V12.59)  His updated medication list for this problem includes:    Coumadin 2.5 Mg Tabs (Warfarin sodium) .Marland Kitchen... Take as directed by coumadin clinic.    Ramipril 5 Mg Caps (Ramipril) .Marland Kitchen... Take one capsule by mouth daily    Aspirin 81 Mg Tbec (Aspirin) .Marland Kitchen... Take one tablet by mouth  daily Atrial fibrillation rate is controlled.  The patient is on Coumadin.  No change in therapy.  Problem # 4:  AAA (ICD-441.4) The patient tells me that he is at his abdominal aortic aneurysm checked in the vascular surgery office.  It is stable.  Problem # 5:  tobacco abuse the patient is smoking cigars.  I have counseled him to stop.  Other Orders: Echocardiogram (Echo)  Patient Instructions: 1)  Follow up in 6 months

## 2010-04-10 NOTE — Miscellaneous (Signed)
Summary: [facility] Orders   [facility] Orders   Imported By: Roderic Ovens 01/08/2010 11:07:50  _____________________________________________________________________  External Attachment:    Type:   Image     Comment:   External Document

## 2010-04-10 NOTE — Medication Information (Signed)
Summary: rov mb  Anticoagulant Therapy  Managed by: Bethanne Ginger, PharmD Referring MD: Willa Rough MD PCP: Benedetto Goad MD Supervising MD: Gala Romney MD, Reuel Boom Indication 1: Atrial Fibrillation (ICD-427.31) Lab Used: [facility] Site: [facility] INR POC 2.4 INR RANGE 2 - 3  Dietary changes: no    Health status changes: no    Bleeding/hemorrhagic complications: yes       Details: Minor nose bleed last week  Recent/future hospitalizations: no    Any changes in medication regimen? yes       Details: Has been on an antibiotic for 2 weeks for leg infection, pt unsure what the name of abx is, will be stopping it at the end of the week  Recent/future dental: no  Any missed doses?: no       Is patient compliant with meds? yes       Current Medications (verified): 1)  Coumadin 2.5 Mg Tabs (Warfarin Sodium) .... Take As Directed By Coumadin Clinic. 2)  Lipitor 20 Mg Tabs (Atorvastatin Calcium) .... Take One Tablet By Mouth Daily. 3)  Furosemide 20 Mg Tabs (Furosemide) .... Take One Tablet By Mouth Two Times A Day . 4)  Metolazone 2.5 Mg Tabs (Metolazone) .... Take One Tablet By Mouth Once Daily. 5)  Ramipril 5 Mg Caps (Ramipril) .... Take One Capsule By Mouth Daily 6)  Amaryl 4 Mg Tabs (Glimepiride) .... Take One Tablet By Mouth Once Daily. 7)  Metformin Hcl 1000 Mg Tabs (Metformin Hcl) .... Take One Tablet By Mouth Twice Daily. 8)  Spironolactone 25 Mg Tabs (Spironolactone) .... Take One Tablet By Mouth Two Times A Day .  Allergies (verified): 1)  ! Morphine  Anticoagulation Management History:      The patient is taking warfarin and comes in today for a routine follow up visit.  Positive risk factors for bleeding include an age of 72 years or older.  The bleeding index is 'intermediate risk'.  Negative CHADS2 values include Age > 11 years old.  The start date was 11/24/2007.  His last INR was 2.5.  Anticoagulation responsible provider: Bensimhon MD, Reuel Boom.  INR POC: 2.4.   Cuvette Lot#: [id number].  Exp: 06/2010.    Anticoagulation Management Assessment/Plan:      The patient's current anticoagulation dose is Coumadin 2.5 mg tabs: Take as directed by coumadin clinic..  The target INR is 2 - 3.  The next INR is due 05/29/2009.  Anticoagulation instructions were given to patient.  Results were reviewed/authorized by Bethanne Ginger, PharmD.  He was notified by Bethanne Ginger.         Prior Anticoagulation Instructions: INR=2.5   The patient is to continue with the same dose of coumadin.  This dosage includes: 1 tablet every day except for on monday and friday take 0.5 tabs  Current Anticoagulation Instructions: INR = 2.4 The patient is to continue with the same dose of coumadin.  This dosage includes:  1 tab daily on Sun, Tue, Wed, Thur, Sat 1/2 tab daily on Mon, Fri

## 2010-04-10 NOTE — Medication Information (Signed)
Summary: rov/tm  Anticoagulant Therapy  Managed by: Loma Newton, PharmD Referring MD: Willa Rough MD PCP: Benedetto Goad, MD Supervising MD: Juanda Chance MD, Kaysey Berndt Indication 1: Atrial Fibrillation (ICD-427.31) Lab Used: [facility] Site: [facility] INR POC 2.7 INR RANGE 2 - 3  Dietary changes: no    Health status changes: no    Bleeding/hemorrhagic complications: no    Recent/future hospitalizations: no    Any changes in medication regimen? no    Recent/future dental: no    Comments: Pt. is going to his PCP on wednesday (4/20) to discuss when his conoloscopy will be.  Told patient to make sure MD tells him to hold his coumadin before the proceedure for 5 days.   Current Medications (verified): 1)  Coumadin 2.5 Mg Tabs (Warfarin Sodium) .... Take As Directed By Coumadin Clinic. 2)  Simvastatin 40 Mg Tabs (Simvastatin) .... Take One Tablet By Mouth Daily At Bedtime 3)  Furosemide 20 Mg Tabs (Furosemide) .... Take One Tablet By Mouth Two Times A Day . 4)  Metolazone 2.5 Mg Tabs (Metolazone) .... Take One Tablet By Mouth Once Daily. 5)  Ramipril 5 Mg Caps (Ramipril) .... Take One Capsule By Mouth Daily 6)  Amaryl 4 Mg Tabs (Glimepiride) .... Take One Tablet By Mouth Once Daily. 7)  Metformin Hcl 1000 Mg Tabs (Metformin Hcl) .... Take One Tablet By Mouth Twice Daily. 8)  Spironolactone 25 Mg Tabs (Spironolactone) .... Take One Tablet By Mouth Two Times A Day . 9)  Aspirin 81 Mg Tbec (Aspirin) .... Take One Tablet By Mouth Daily  Allergies (verified): 1)  ! Morphine  Anticoagulation Management History:      The patient is taking warfarin and comes in today for a routine follow up visit.  Positive risk factors for bleeding include an age of 72 years or older.  The bleeding index is 'intermediate risk'.  Negative CHADS2 values include Age > 72 years old.  The start date was 11/24/2007.  His last INR was 2.5 and today's INR is 2.7.  Anticoagulation responsible provider: Juanda Chance MD,  Smitty Cords.  INR POC: 2.7.  Cuvette Lot#: [id number].  Exp: 08/2010.    Anticoagulation Management Assessment/Plan:      The patient's current anticoagulation dose is Coumadin 2.5 mg tabs: Take as directed by coumadin clinic..  The target INR is 2 - 3.  The next INR is due 07/24/2009.  Anticoagulation instructions were given to patient.  Results were reviewed/authorized by Loma Newton, PharmD.  He was notified by Loma Newton.         Prior Anticoagulation Instructions: INR 2.6 Continue 1 pill everyday except 1/2 pill on Mondays and Fridays. REcheck in 4 weeks.   Current Anticoagulation Instructions: INR = 2.7 (goal = 2-3) Good job!   The patient is to continue with the same dose of coumadin.  This dosage includes: 1 tablet all days except monday and friday take half a tablet

## 2010-04-10 NOTE — Medication Information (Signed)
Summary: [facility]in Clinic  Anticoagulant Therapy  Managed by: Weston Brass, PharmD Referring MD: Willa Rough MD PCP: Benedetto Goad, MD Supervising MD: Ladona Ridgel MD, Sharlot Gowda Indication 1: Atrial Fibrillation (ICD-427.31) Lab Used: [facility] [place] [facility]  Site: [address] PT 20.0 INR POC 1.7 INR RANGE 2 - 3  Dietary changes: no    Health status changes: no    Bleeding/hemorrhagic complications: no    Recent/future hospitalizations: yes       Details: pt discharged from [facility] yesterday after I and D of L knee and implantation of new abx spacer.  INR was 2.52 on admission.  Pt was given vit k .  INR was 1.5 on discharge.  He had been given 3 doses of Coumadin prior to discharge  Any changes in medication regimen? yes       Details: on Rocephin 2 gm daily   Recent/future dental: no  Any missed doses?: no       Is patient compliant with meds? yes       Allergies: 1)  ! Morphine 2)  ! Amoxicillin  Anticoagulation Management History:      His anticoagulation is being managed by telephone today.  Positive risk factors for bleeding include an age of 56 years or older.  The bleeding index is 'intermediate risk'.  Negative CHADS2 values include Age > 13 years old.  The start date was 11/24/2007.  His last INR was 2.7.  Prothrombin time is 20.0.  Anticoagulation responsible provider: Ladona Ridgel MD, Sharlot Gowda.  INR POC: 1.7.  Exp: 10/2010.    Anticoagulation Management Assessment/Plan:      The patient's current anticoagulation dose is Coumadin 2.5 mg tabs: Take as directed by coumadin clinic..  The target INR is 2 - 3.  The next INR is due 02/20/2010.  Anticoagulation instructions were given to patient.  Results were reviewed/authorized by Weston Brass, PharmD.  He was notified by Weston Brass PharmD.         Prior Anticoagulation Instructions: INR 2.1  Spoke with pt's wife.  Continue same dose of 1 tablet every day except 1/2 tablet on Monday, Wednesday and Friday.  Recheck INR in 4 weeks.   Orders faxed to [facility].   Current Anticoagulation Instructions: INR 1.7  Spoke with Judeth Cornfield with [facility] while in pt's home.  Take 3.75mg  today then resume same dose of 2.5mg  daily except 1.25mg  on Monday, Wednesday and Friday.  Recheck INR in 1 week.

## 2010-04-10 NOTE — Letter (Signed)
Summary: [facility] Visit   [facility] Visit   Imported By: Roderic Ovens 10/16/2009 12:29:57  _____________________________________________________________________  External Attachment:    Type:   Image     Comment:   External Document

## 2010-04-10 NOTE — Assessment & Plan Note (Signed)
Summary: rov   Visit Type:  Follow-up Referring Provider:  Dr. Charlann Boxer Primary Provider:  Benedetto Goad, MD  CC:  CAD.  History of Present Illness: The patient is seen for cardiology followup.  I saw him last in the office in March, 2011.  I also saw him during his hospitalization in July,.  He had a syncopal episode.  This seemed to be related to dehydration.  He stabilized and there was no evidence of cardiac abnormalities.  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  It cannot be replaced until the lesion on his leg is completely healed and his ankle. He has significant edema in his legs.  This has been a chronic problem.  Unfortunately with his lack of ambulation currently, his feet are dependent during the day at times and his edema is significant.  Is not having any signs of heart failure.  He has good renal function.  Current Medications (verified): 1)  Coumadin 2.5 Mg Tabs (Warfarin Sodium) .... Take As Directed By Coumadin Clinic. 2)  Simvastatin 40 Mg Tabs (Simvastatin) .... Take One Tablet By Mouth Daily At Bedtime 3)  Furosemide 40 Mg Tabs (Furosemide) .... Take One Tablet By Mouth Daily. 4)  Metolazone 2.5 Mg Tabs (Metolazone) .... Take One Tablet By Mouth Once Daily. 5)  Ramipril 5 Mg Caps (Ramipril) .... Take One Capsule By Mouth Daily 6)  Metformin Hcl 500 Mg Tabs (Metformin Hcl) .... Two Times A Day 7)  Spironolactone 25 Mg Tabs (Spironolactone) .... Take One Tablet By Mouth Two Times A Day . 8)  Aspirin 81 Mg Tbec (Aspirin) .... Take One Tablet By Mouth Daily 9)  Doxycycline Hyclate 100 Mg Tabs (Doxycycline Hyclate) .... Take 1 Tablet By Mouth Two Times A Day 10)  Norco 5-325 Mg Tabs (Hydrocodone-Acetaminophen) .Marland Kitchen.. 1 Tablet Up To Three Times A Day  Allergies (verified): 1)  ! Morphine 2)  ! Amoxicillin  Past History:  Past Medical History: 1. Hypertension.   2. Obesity.   cholecystectomy. 4. Coronary disease status post CABG followed by a  redo in 1994/.Marland KitchenMarland KitchenMyoview.2007. old infarct/no ischemia 5. Ejection fraction of 45%...echo 2004 6. Hyperlipidemia, treated. 7. Cigarette use.  He is now smoking cigars and needs to quit. 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 no replacement until leg is stable 9. History of prostate cancer with a seed implant and this is stable. 10.History of abdominal aortic aneurysm that is stable, being followed     by Dr. Liliane Bade. 11.Atrial fibrillation.  His rate is controlled.  He clearly is going     in and out of atrial fib.  He needs to be on Coumadin.  I have     discussed this with him and he is willing to be on Coumadin.  Volume overload carotid artery disease.... Doppler September 2 009 40-50% left Right bundle branch block noted November 09, 2008.  Review of Systems       Patient denies fever, chills, headache, sweats, rash, change in vision, change in hearing, chest pain, cough, nausea vomiting, urinary symptoms.  All of the systems are reviewed and are negative.  Vital Signs:  Patient profile:   70 year old male Height:      70 inches Weight:      227 pounds BMI:     32.69 Pulse rate:   90 / minute BP sitting:   112 / 58  (left arm) Cuff size:   regular  Vitals Entered By: Sander Radon  Delford Field CMA (February 02, 2010 3:22 PM)  Physical Exam  General:  he is stable in a wheelchair. Head:  head is atraumatic. Eyes:  no xanthelasma. Neck:  no jugular venous distention. Chest Wall:  no chest wall tenderness. Lungs:  lungs are clear.  Respiratory effort is nonlabored. Heart:  cardiac exam reveals S1-S2.  No clicks or significant murmurs. Abdomen:  abdomen is soft. Msk:  patient has a difficulties with his left knee prosthesis removed Extremities:  2+ peripheral edema. Skin:  skin lesion on the left medial aspect of ankle.  This is bandaged. Psych:  patient is oriented to person time and place.  Affect is normal.   Impression & Recommendations:  Problem # 1:   CELLULITIS AND ABSCESS OF LEG EXCEPT FOOT (ICD-682.6)  His updated medication list for this problem includes:    Doxycycline Hyclate 100 Mg Tabs (Doxycycline hyclate) .Marland Kitchen... Take 1 tablet by mouth two times a day This is being treated.  Problem # 2:  CAROTID ARTERY DISEASE (ICD-433.10)  His updated medication list for this problem includes:    Coumadin 2.5 Mg Tabs (Warfarin sodium) .Marland Kitchen... Take as directed by coumadin clinic.    Aspirin 81 Mg Tbec (Aspirin) .Marland Kitchen... Take one tablet by mouth daily at the time of his next visit I will consider followup Doppler.  Problem # 3:  FLUID OVERLOAD (ICD-276.6) the patient does have significant edema.  Some of it is dependent due to venous insufficiency.  I considered increased diuretic dosing.Marland Kitchen  He then became clear that he was not taking his current diuretics regularly.  We talked about getting onto a very regular schedule.  Problem # 4:  ATRIAL FIBRILLATION, HX OF (ICD-V12.59)  His updated medication list for this problem includes:    Coumadin 2.5 Mg Tabs (Warfarin sodium) .Marland Kitchen... Take as directed by coumadin clinic.    Ramipril 5 Mg Caps (Ramipril) .Marland Kitchen... Take one capsule by mouth daily    Aspirin 81 Mg Tbec (Aspirin) .Marland Kitchen... Take one tablet by mouth daily The patient is on Coumadin.  His rate is controlled.  Problem # 5:  TOBACCO ABUSE, HX OF (ICD-V15.82) The patient is smoking.  I've encouraged him to stop and counseled him.  Problem # 6:  DYSLIPIDEMIA (ICD-272.4)  His updated medication list for this problem includes:    Simvastatin 40 Mg Tabs (Simvastatin) .Marland Kitchen... Take one tablet by mouth daily at bedtimeL. Lipids were checked in June, 2011.  LDL was 50.  Problem # 7:  CAD (ICD-414.00)  His updated medication list for this problem includes:    Coumadin 2.5 Mg Tabs (Warfarin sodium) .Marland Kitchen... Take as directed by coumadin clinic.    Ramipril 5 Mg Caps (Ramipril) .Marland Kitchen... Take one capsule by mouth daily    Aspirin 81 Mg Tbec (Aspirin) .Marland Kitchen... Take one  tablet by mouth daily Coronary disease is stable.  No further workup needed. Up to a  Patient Instructions: 1)  Your physician recommends that you schedule a follow-up appointment in: 6 MONTHS WITH DR Myrtis Ser 2)  Your physician recommends that you continue on your current medications as directed. Please refer to the Current Medication list given to you today.

## 2010-04-10 NOTE — Progress Notes (Signed)
Summary: come off coumadin   Phone Note From Other Clinic   Caller: Therisa Doyne [phone] Request: Talk with Nurse Summary of Call: can pt come off coumdain 5 day prior left leg angio.  Initial call taken by: Lorne Skeens,  Jul 17, 2009 2:10 PM  Follow-up for Phone Call        Yes, OK to come off coumadin  Talitha Givens, MD, [facility]  Jul 17, 2009 2:41 PM  Darel Hong aware, note faxed to her at [phone] Meredith Staggers, RN  Jul 17, 2009 3:11 PM

## 2010-04-10 NOTE — Medication Information (Signed)
Summary: Coumadin Clinic  Anticoagulant Therapy  Managed by: Weston Brass, PharmD Referring MD: Willa Rough MD PCP: Benedetto Goad, MD Supervising MD: Excell Seltzer MD, Casimiro Needle Indication 1: Atrial Fibrillation (ICD-427.31) Lab Used: [facility] [place] Site: [address] PT 23.7 INR POC 2.0 INR RANGE 2 - 3  Dietary changes: no    Health status changes: no    Bleeding/hemorrhagic complications: no    Recent/future hospitalizations: no    Any changes in medication regimen? no    Recent/future dental: no  Any missed doses?: yes     Details: missed last night's dose.  Took 1 whole tablet already today   Is patient compliant with meds? yes       Allergies: 1)  ! Morphine  Anticoagulation Management History:      His anticoagulation is being managed by telephone today.  Positive risk factors for bleeding include an age of 55 years or older.  The bleeding index is 'intermediate risk'.  Negative CHADS2 values include Age > 109 years old.  The start date was 11/24/2007.  His last INR was 2.7.  Prothrombin time is 23.7.  Anticoagulation responsible provider: Excell Seltzer MD, Casimiro Needle.  INR POC: 2.0.  Exp: 10/2010.    Anticoagulation Management Assessment/Plan:      The patient's current anticoagulation dose is Coumadin 2.5 mg tabs: Take as directed by coumadin clinic..  The target INR is 2 - 3.  The next INR is due 12/11/2009.  Anticoagulation instructions were given to home health nurse.  Results were reviewed/authorized by Weston Brass, PharmD.  He was notified by Weston Brass PharmD.         Prior Anticoagulation Instructions: INR 2.8  Continue same dose of 1.25mg  daily except 2.5mg  on Tuesday, Thursday and Saturday.  Recheck INR in 1 week.  Orders given to [facility] with [facility] while in pts home.   Current Anticoagulation Instructions: INR 2.0  Spoke with pt.  Continue same dose of 1/2 tablet every day except 1 tablet on Tuesday, Thursday and Saturday.  Recheck INR in 1 week.  Orders  given to Ter[place] with [facility].

## 2010-04-10 NOTE — Consult Note (Signed)
Summary: [facility]  [facility]   Imported By: Florinda Marker 01/02/2010 14:05:47  _____________________________________________________________________  External Attachment:    Type:   Image     Comment:   External Document

## 2010-04-10 NOTE — Progress Notes (Signed)
Summary: Medication question  Phone Note Call from Patient Call back at [phone]   Caller: Spouse/Pamela Summary of Call: Pt wife calling regarding let Dr. Myrtis Ser know that the pt is in [place] and would like Dr.Coriann Brouhard to check on the pt,and wife have question about the medications the pt takes Initial call taken by: Judie Grieve,  August 25, 2009 8:41 AM  Follow-up for Phone Call        pt in [place] rm 1512 w/infection on left leg has been gettting vanc since Wed. they have made quite a bit of med changes and wife is a little concerned would like our MDs to see him, Marlowe Kays aware and will have someone see pt Meredith Staggers, RN  August 25, 2009 10:02 AM

## 2010-04-10 NOTE — Letter (Signed)
Summary: [facility]   Imported By: Marylou Mccoy 12/12/2009 11:20:39  _____________________________________________________________________  External Attachment:    Type:   Image     Comment:   External Document

## 2010-04-10 NOTE — Medication Information (Signed)
Summary: Coumadin Clinic  Anticoagulant Therapy  Managed by: Bethena Midget, RN, BSN Referring MD: Willa Rough MD PCP: Benedetto Goad, MD Supervising MD: Eden Emms MD, Theron Arista Indication 1: Atrial Fibrillation (ICD-427.31) Lab Used: [facility] [place] Site: [address] PT 34.8 INR POC 3.46 INR RANGE 2 - 3  Dietary changes: no    Health status changes: no    Bleeding/hemorrhagic complications: no    Recent/future hospitalizations: no    Any changes in medication regimen? no    Recent/future dental: no  Any missed doses?: no       Is patient compliant with meds? yes       Allergies: 1)  ! Morphine  Anticoagulation Management History:      His anticoagulation is being managed by telephone today.  Positive risk factors for bleeding include an age of 72 years or older.  The bleeding index is 'intermediate risk'.  Negative CHADS2 values include Age > 72 years old.  The start date was 11/24/2007.  His last INR was 2.7.  Prothrombin time is 34.8.  Anticoagulation responsible provider: Eden Emms MD, Theron Arista.  INR POC: 3.46.    Anticoagulation Management Assessment/Plan:      The patient's current anticoagulation dose is Coumadin 2.5 mg tabs: Take as directed by coumadin clinic..  The target INR is 2 - 3.  The next INR is due 11/20/2009.  Anticoagulation instructions were given to spouse.  Results were reviewed/authorized by Bethena Midget, RN, BSN.  He was notified by Bethena Midget, RN, BSN.         Prior Anticoagulation Instructions: INR 2.7  Attempted to call pt with results.  LMOM TCB for results. Cloyde Reams RN  November 02, 2009 2:47 PM  Spoke with pt's wife, advised to continue on same dosage 2.5mg  daily except 1.25mg  on MOndays and Fridays.  Recheck in 10 days. Called [facility] spoke with Lynden Ang, gave verbal orders to redraw on 11/13/09. Cloyde Reams RN  November 02, 2009 3:22 PM   Current Anticoagulation Instructions: INR 3.46 Today take 1.25mg s then change dose to 2.5mg s daily  except 1.25mg s on MWF. Recheck in one week. Orders given to Lenon Curt at [facility].

## 2010-04-11 ENCOUNTER — Ambulatory Visit: Admit: 2010-04-11 | Payer: Self-pay | Admitting: Infectious Disease

## 2010-04-11 ENCOUNTER — Ambulatory Visit: Payer: Self-pay | Admitting: Infectious Disease

## 2010-04-12 NOTE — Consult Note (Signed)
Summary: [facility]  [facility]   Imported By: Florinda Marker 04/03/2010 16:06:12  _____________________________________________________________________  External Attachment:    Type:   Image     Comment:   External Document

## 2010-04-12 NOTE — Medication Information (Signed)
Summary: Coumadin Clinic  Anticoagulant Therapy  Managed by: Cloyde Reams, RN, BSN Referring MD: Willa Rough MD PCP: Benedetto Goad, MD Supervising MD: Tenny Craw MD, Gunnar Fusi Indication 1: Atrial Fibrillation (ICD-427.31) Lab Used: [facility] [place] Site: [address] INR POC 2.09 INR RANGE 2 - 3  Dietary changes: no    Health status changes: no    Bleeding/hemorrhagic complications: no    Recent/future hospitalizations: no    Any changes in medication regimen? yes       Details: Pt continues on Cipro BID and he is also on vancomycin IV.  Recent/future dental: no  Any missed doses?: no       Is patient compliant with meds? yes       Allergies: 1)  ! Morphine 2)  ! Amoxicillin  Anticoagulation Management History:      His anticoagulation is being managed by telephone today.  Positive risk factors for bleeding include an age of 8 years or older.  The bleeding index is 'intermediate risk'.  Negative CHADS2 values include Age > 74 years old.  The start date was 11/24/2007.  His last INR was 2.7.  Anticoagulation responsible provider: Tenny Craw MD, Gunnar Fusi.  INR POC: 2.09.  Exp: 10/2010.    Anticoagulation Management Assessment/Plan:      The patient's current anticoagulation dose is Coumadin 2.5 mg tabs: Take as directed by coumadin clinic..  The target INR is 2 - 3.  The next INR is due 04/13/2010.  Anticoagulation instructions were given to patient.  Results were reviewed/authorized by Cloyde Reams, RN, BSN.  He was notified by Bethena Midget, RN, BSN.         Prior Anticoagulation Instructions: INR 2.12 Continue 1 pill everyday except 1/2 pill on MWF. Recheck in 3 days. Orders given to Judeth Cornfield B.  at [facility] team.   Current Anticoagulation Instructions: INR 2.09  Called spoke with pt advised to continue on same dosage 1 tablet daily except 1/2 tablet on Mondays, Wednesdays, and Fridays. Recheck in 1 week.    Called [facility] spoke with Hillery Aldo advised to recheck  in 1 week.

## 2010-04-12 NOTE — Medication Information (Addendum)
Summary: Coumadin Clinic  Anticoagulant Therapy  Managed by: Bethena Midget, RN, BSN Referring MD: Willa Rough MD PCP: Benedetto Goad, MD Supervising MD: Daleen Squibb MD, Maisie Fus Indication 1: Atrial Fibrillation (ICD-427.31) Lab Used: [facility] [place] Site: [address] PT 23.9 INR POC 2.12 INR RANGE 2 - 3  Dietary changes: no    Health status changes: no    Bleeding/hemorrhagic complications: no    Recent/future hospitalizations: yes       Details: Was at [facility] from 16-20th    Any changes in medication regimen? yes       Details: Ciprofloxacin 500mg  BID  Recent/future dental: no  Any missed doses?: no       Is patient compliant with meds? yes       Allergies: 1)  ! Morphine 2)  ! Amoxicillin  Anticoagulation Management History:      His anticoagulation is being managed by telephone today.  Positive risk factors for bleeding include an age of 72 years or older.  The bleeding index is 'intermediate risk'.  Negative CHADS2 values include Age > 12 years old.  The start date was 11/24/2007.  His last INR was 2.7.  Prothrombin time is 23.9.  Anticoagulation responsible provider: Daleen Squibb MD, Maisie Fus.  INR POC: 2.12.    Anticoagulation Management Assessment/Plan:      The patient's current anticoagulation dose is Coumadin 2.5 mg tabs: Take as directed by coumadin clinic..  The target INR is 2 - 3.  The next INR is due 04/06/2010.  Anticoagulation instructions were given to patient.  Results were reviewed/authorized by Bethena Midget, RN, BSN.  He was notified by Bethena Midget, RN, BSN.         Prior Anticoagulation Instructions: INR 1.98  Spoke with pt.  Continue same dose of 1 tablet every day except 1/2 tablet on Monday, Wednesday and Friday. Recheck INR in 1 week.  Gave orders to [place] with [facility].   Current Anticoagulation Instructions: INR 2.12 Continue 1 pill everyday except 1/2 pill on MWF. Recheck in 3 days. Orders given to [facility] B.  at [facility] team.

## 2010-04-12 NOTE — Medication Information (Signed)
Summary: Coumadin Clinic  Anticoagulant Therapy  Managed by: Weston Brass, PharmD Referring MD: Willa Rough MD PCP: Benedetto Goad, MD Supervising MD: Shirlee Latch MD, Dalton Indication 1: Atrial Fibrillation (ICD-427.31) Lab Used: [facility] [place] Site: [address] PT 22.7 INR POC 1.98 INR RANGE 2 - 3  Dietary changes: no    Health status changes: no    Bleeding/hemorrhagic complications: no    Recent/future hospitalizations: no    Any changes in medication regimen? no    Recent/future dental: no  Any missed doses?: no       Is patient compliant with meds? yes       Allergies: 1)  ! Morphine 2)  ! Amoxicillin  Anticoagulation Management History:      His anticoagulation is being managed by telephone today.  Positive risk factors for bleeding include an age of 72 years or older.  The bleeding index is 'intermediate risk'.  Negative CHADS2 values include Age > 23 years old.  The start date was 11/24/2007.  His last INR was 2.7.  Prothrombin time is 22.7.  Anticoagulation responsible Saidee Geremia: Shirlee Latch MD, Dalton.  INR POC: 1.98.  Exp: 10/2010.    Anticoagulation Management Assessment/Plan:      The patient's current anticoagulation dose is Coumadin 2.5 mg tabs: Take as directed by coumadin clinic..  The target INR is 2 - 3.  The next INR is due 02/28/2010.  Anticoagulation instructions were given to patient.  Results were reviewed/authorized by Weston Brass, PharmD.  He was notified by Weston Brass PharmD.         Prior Anticoagulation Instructions: INR 1.7  Spoke with Judeth Cornfield with [facility] while in pt's home.  Take 3.75mg  today then resume same dose of 2.5mg  daily except 1.25mg  on Monday, Wednesday and Friday.  Recheck INR in 1 week.   Current Anticoagulation Instructions: INR 1.98  Spoke with pt.  Continue same dose of 1 tablet every day except 1/2 tablet on Monday, Wednesday and Friday. Recheck INR in 1 week.  Gave orders to [place] with [facility].

## 2010-04-12 NOTE — Assessment & Plan Note (Signed)
Summary: f/u [mkj]   Referring Provider:  Dr. Charlann Boxer Primary Provider:  Benedetto Goad, MD  CC:  hsfu.  History of Present Illness: 70 yo man who had prior infection of left TKA in 2005 apparently managed by Dr. Roxan Hockey at that time. According to the wife and the pt had infection due to a cat scratch. 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 Roxan Hockey or of isolation of Pasteurella from culture. IN any case he had recurrent infection in his left knee this July 2011after he had bout of cellulitis that originated with ulcers in lower extremity. He was treated by the hospitalist team with broad spectrum antibiotics. Ultimately he underwent I and D with resection arthroplasty and placement of antibiotic spacer on 09/28/09. Intraoperative cultures grew Diphtheroids, though one culture was initially read as CNS and the other as having GNR.He was placed on IV Vancomycin and treated for nearly 12 weeks with IV vancomycin and seen in [place] by Dr. Charlann Boxer who aspirated knee in September and encountered wbc of 1800 on aspiraate without any organisms growing. I saw him this fall and put him on doxycyline. I did a swab culture of surface exudate from on of his wound which grew proteus but I considered it a skin contaminant. In the interim hedeveloped recurrenc of infection in his knee and uderwent yet again I and D and removal of antibiotic spacer by Dr Charlann Boxer. Cultures now grew a pure GBS. He was changed to rocephin whichhe is currently one and he claims to be doing better. He had pruritis and large discharg from wound bleow the knee on Friday and still with purulent drainage at times. He still has ulcers on heels that are being rx with wound care. HE says that Dr Charlann Boxer is warning him he may need AKA to savlage this leg ultimately. The pt presnts for fu to my clinic  l. I spent greater than 45 minutes with this pt including greater than 50% of time spent face to face  counselling.  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 Take As Directed By Coumadin Clinic. 2)  Simvastatin 40 Mg Tabs (Simvastatin) .... Take One Tablet By Mouth Daily At Bedtime 3)  Furosemide 40 Mg Tabs (Furosemide) .... Take One Tablet By Mouth Daily. 4)  Metolazone 2.5 Mg Tabs (Metolazone) .... Take One Tablet By Mouth Once Daily. 5)  Ramipril 5 Mg Caps (Ramipril) .... Take One Capsule By Mouth Daily 6)  Metformin Hcl 500 Mg Tabs (Metformin Hcl) .... Two Times A Day 7)  Spironolactone 25 Mg Tabs (Spironolactone) .... Take One Tablet By Mouth Two Times A Day . 8)  Aspirin 81 Mg Tbec (Aspirin) .... Take One Tablet By Mouth Daily 9)  Doxycycline Hyclate 100 Mg Tabs (Doxycycline Hyclate) .... Take 1 Tablet By Mouth Two Times A Day 10)  Norco 5-325 Mg Tabs (Hydrocodone-Acetaminophen) .Marland Kitchen.. 1 Tablet Up To Three Times A Day  Current Medications (  verified): 1)  Coumadin 2.5 Mg Tabs (Warfarin Sodium) .... Take As Directed By Coumadin Clinic. 2)  Simvastatin 40 Mg Tabs (Simvastatin) .... Take One Tablet By Mouth Daily At Bedtime 3)  Furosemide 40 Mg Tabs (Furosemide) .... Take One Tablet By Mouth Daily. 4)  Metolazone 2.5 Mg Tabs (Metolazone) .... Take One Tablet By Mouth Once Daily. 5)  Ramipril 5 Mg Caps (Ramipril) .... Take One Capsule By Mouth Daily 6)  Metformin Hcl 500 Mg Tabs (Metformin Hcl) .... Two Times A Day 7)  Spironolactone 25 Mg Tabs (Spironolactone) .... Take One Tablet By Mouth Two Times A Day  . 8)  Aspirin 81 Mg Tbec (Aspirin) .... Take One Tablet By Mouth Daily 9)  Norco 5-325 Mg Tabs (Hydrocodone-Acetaminophen) .Marland Kitchen.. 1 Tablet Up To Three Times A Day 10)  Rocephin 1 Gm Solr (Ceftriaxone Sodium) .... Once Daily  Allergies: 1)  ! Morphine 2)  ! Amoxicillin    Current Allergies (reviewed today): ! MORPHINE ! AMOXICILLIN Past History:  Past Medical History: Last updated: 02/02/2010 1. Hypertension.   2. Obesity.   cholecystectomy. 4. 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 old infarct/no ischemia 5. Ejection fraction of 45%...echo 2004 6. Hyperlipidemia, treated. 7. Cigarette use.  He is now smoking cigars and needs to quit. 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 no replacement until leg is stable 9. History of prostate cancer with a seed implant and this is stable. 10.History of abdominal aortic aneurysm that is stable, being followed     by Dr. Liliane Bade. 11.Atrial fibrillation.  His rate is controlled.  He clearly is going     in and out of atrial fib.  He needs to be on Coumadin.  I have     discussed this with him and he is willing to be on Coumadin.  Volume overload carotid artery disease.... Doppler September 2 009 40-50% left Right bundle branch block noted November 09, 2008.  Social History: Last updated: 12/19/2009 married, accompanied by wife  Risk Factors: Alcohol Use: 0 (12/19/2009) Caffeine Use: coffee,tea (12/19/2009) Exercise: no (12/19/2009)  Risk Factors: Smoking Status: current (12/19/2009) Packs/Day: 0.5 (12/19/2009)  Review of Systems       The patient complains of suspicious skin lesions.  The patient denies anorexia, fever, weight loss, weight gain, vision loss, decreased hearing, hoarseness, chest pain, syncope, dyspnea on exertion, peripheral edema, prolonged cough, headaches, hemoptysis, abdominal pain, melena, hematochezia, severe indigestion/heartburn, hematuria, incontinence,  genital sores, muscle weakness, transient blindness, difficulty walking, depression, unusual weight change, abnormal bleeding, and enlarged lymph nodes.    Vital Signs:  Patient profile:   70 year old male Weight:      215 pounds (97.73 kg) BMI:     30.96 Temp:     97.6 degrees F (36.44 degrees C) oral Pulse rate:   76 / minute BP sitting:   130 / 65  (left arm)  Vitals Entered By: Starleen Arms CMA (February 28, 2010 2:59 PM) CC: hsfu Is Patient Diabetic? Yes Did you bring your meter with you today? No Pain Assessment Patient in pain? no      Nutritional Status Detail nl  Does patient need assistance? Functional Status Cook/clean, Shopping, Social activities Ambulation Wheelchair   Physical Exam  General:  alert, well-nourished, and well-hydrated.   Head:  normocephalic, atraumatic, and no abnormalities observed.   Eyes:  vision grossly intact, pupils equal, and pupils round.   Ears:  no external deformities.   Nose:  no external erythema and no nasal discharge.   Mouth:  pharynx pink and moist and no erythema.   Neck:  supple and full ROM.   Lungs:  normal respiratory effort, no intercostal retractions, no accessory muscle use, and normal breath sounds.   Heart:  normal rate, regular rhythm, no murmur, no gallop, and no rub.   Abdomen:  soft, non-tender, and normal bowel sounds.   Msk:  see skin Extremities:  3+ edema L greater than right Neurologic:  alert & oriented X3.  in wheel chair moves all extremities Skin:  he has bilateraly venous stasis changes to skin, his left knee is swollen and there is nonhealing wound distal to knee with some purulence on bandagehe has ulcers on his heel as well skin wit moist material on it Psych:  Oriented X3, memory intact for recent and remote, and normally interactive.     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 Will  continue his rocephin and if he is able to suppress this infeciton get him on chronic oral agent such as levaquin or keflex (he has severe pCN allergy) He may indeed need aka to cure this The following medications were removed from the medication list:    Doxycycline Hyclate 100 Mg Tabs (Doxycycline hyclate) .Marland Kitchen... Take 1 tablet by mouth two times a day His updated medication list for this problem includes:    Aspirin 81 Mg Tbec (Aspirin) .Marland Kitchen... Take one tablet by mouth daily    Norco 5-325 Mg Tabs (Hydrocodone-acetaminophen) .Marland Kitchen... 1 tablet up to three times a day    Rocephin 1 Gm Solr (Ceftriaxone sodium) ..... Once daily  Orders: Est. Patient Level V (78469)  Problem # 2:  STREPTOCOCCUS INFECTION CCE & UNS SITE GROUP B (ICD-041.02)  as above GBS clear cut culprit this time  Orders: Est. Patient Level V (62952)  Problem # 3:  CELLULITIS AND ABSCESS OF LEG EXCEPT FOOT (ICD-682.6)  still also with signifiacnt ulcers on leg that are portal of entry for pathogens in addition to the persistent nidus in his knee The following medications were removed from the medication list:    Doxycycline Hyclate 100 Mg Tabs (Doxycycline hyclate) .Marland Kitchen... Take 1 tablet by mouth two times a day His updated medication list for this problem includes:    Aspirin 81 Mg Tbec (Aspirin) .Marland Kitchen... Take one tablet by mouth daily    Norco 5-325 Mg Tabs (Hydrocodone-acetaminophen) .Marland Kitchen... 1 tablet up to three times a day    Rocephin 1 Gm Solr (Ceftriaxone sodium) ..... Once daily  Orders: Est. Patient Level V (84132)  Medications Added to Medication List This Visit: 1)  Rocephin 1 Gm Solr (Ceftriaxone sodium) .... Once daily  Patient Instructions: 1)  CONTINUE THE ROCEPHIN 2)  WE WILL ASK [facility] TO EXTEND FOR ANTOHER 6 WKS 3)  RTC TO SEE DR. VAN DAM IN 4-5 WKS

## 2010-04-12 NOTE — Miscellaneous (Signed)
Summary: [facility] Orders   [facility] Orders   Imported By: Roderic Ovens 03/22/2010 15:58:37  _____________________________________________________________________  External Attachment:    Type:   Image     Comment:   External Document

## 2010-04-13 NOTE — Miscellaneous (Signed)
Summary: [facility]: Orders  [facility]: Orders   Imported By: Florinda Marker 01/09/2010 10:47:36  _____________________________________________________________________  External Attachment:    Type:   Image     Comment:   External Document

## 2010-04-13 NOTE — Letter (Signed)
Summary: [facility]   [facility]   Imported By: Roderic Ovens 10/09/2009 15:33:23  _____________________________________________________________________  External Attachment:    Type:   Image     Comment:   External Document

## 2010-04-13 NOTE — Miscellaneous (Signed)
Summary: [facility] Orders   [facility] Orders   Imported By: Roderic Ovens 11/14/2009 14:24:31  _____________________________________________________________________  External Attachment:    Type:   Image     Comment:   External Document

## 2010-04-13 NOTE — Medication Information (Signed)
Summary: [facility]: RX  [facility]: RX   Imported By: Florinda Marker 02/27/2010 14:19:47  _____________________________________________________________________  External Attachment:    Type:   Image     Comment:   External Document

## 2010-04-14 NOTE — Consult Note (Signed)
NAME:  Derek Frye, Derek Frye NO.:  [id number]  MEDICAL RECORD NO.:  [id number]          PATIENT TYPE:  INP  LOCATION:  1617                         FACILITY:  [facility]  PHYSICIAN:  Derek Lav, MD  DATE OF BIRTH:  Sep 16, 1940  DATE OF CONSULTATION: DATE OF DISCHARGE:                                CONSULTATION   REASON FOR CONSULTATION:  A patient with recurrent prosthetic knee infection.  HISTORY OF PRESENT ILLNESS:  Derek Frye is a 70 year old Caucasian male who is known to me from visits in the Infectious Disease Clinic.  He has had a quite complicated history of having developed an infection of his left total-knee arthroplasty in 2005.  At that time he was managed by Dr. Roxan Frye.  According to the patient and the wife they state that at that time an infection due to a cat scratch.  He then developed a recurrence of his infection in July 2011 after a bout of cellulitis that originated in ulcers in the lower extremity.  He was admitted to the Hospitalist Service and treated with broad-spectrum antibiotics.  He ultimately underwent an I and D with resection arthroplasty and placed him on antibiotic spacer on September 28, 2009.  Intraoperative cultures grew diphtheroids, although the initial Gram stain on one specimen had been read as a gram-negative rod and another culture had initially been interpreted as coagulase-negative Staphylococcus.  In any case, he grew diphtheroids out of two out of two sites that were taken from the knee. He was placed on intravenous vancomycin and treated for nearly 12 weeks with IV vancomycin.  He was seen by Derek Frye in September who aspirated the knee and found 1800 white blood cells on the knee aspirate but without any organisms.  I also had seen him on followup and placed him on doxycycline.  I had swabbed the surface of an exudate from his wound which had grown a Proteus species that was susceptible to all antibiotics tested.  I  considered it likely to be a contaminant rather than a true pathogen.  In the interim the patient then developed a recurrence of infection in his knee and again underwent I and D of the knee with removal of the antibiotic spacer by Derek Frye.  Cultures taken throughout grew pure group B streptococcus and he was changed to intravenous Rocephin which he continued on.  I saw him in followup on December 21 and at that time he still was having purulent drainage from his knee.  He had ulcers also that have continued to be followed by Wound Care.  My plans had been to continue him on Rocephin and then eventually try and suppress him with an oral drug such as levofloxacin or Keflex.  Unfortunately in the interim the patient again developed swelling of the left knee and on January 5 was seen in the office of Derek Frye and had a fixed flexed contracted knee.  He was taken back to the operating room and underwent an incision and debridement and removal of the old antibiotic spacer with placement of a new antibiotic spacer. Derek Frye had found nonviable tissue and  debris in the joint but there was no mention of actual purulent material.  He removed the old antibiotic spacer and placed a new one.  Intraoperative cultures were obtained and these have failed to grow any organism.  The patient has been continued on intravenous vancomycin afterwards.  We are consulted to assist in the management of this patient with recurrent knee infection despite aggressive surgery and aggressive courses of intravenous antibiotics.  PAST MEDICAL HISTORY: 1. Hypertension. 2. Obesity. 3. Coronary artery disease status post coronary artery bypass grafting     with redo in 1994. 4. Hyperlipidemia. 5. Cigarette use, has been smoking cigars recently. 6. Prostate cancer with seed implantation. 7. Atrial fibrillation. 8. Carotid artery disease, right bundle branch block.  PAST SURGICAL HISTORY:  Coronary artery bypass  grafting and redo in 1994, multiple repairs of his infected left total-knee arthroplasty since 2005.  FAMILY HISTORY:  Positive for hypertension and coronary artery disease.  SOCIAL HISTORY:  The patient is married with his wife.  He did smoke cigars recently.  REVIEW OF SYSTEMS:  As described in the history of present illness. Otherwise 12-point review of systems is negative.  ALLERGIES:  The patient apparently has a severe PENICILLIN allergy which causes swelling, although he does tolerate cephalosporins.  MORPHINE causes nausea and vomiting.  CURRENT MEDICATIONS:  Include aspirin, Docusate, ferrous sulfate, Lasix, metformin, Zaroxolyn, Zocor, Aldactone, vancomycin, Coumadin, Tylenol, Dulcolax, Klonopin, Dilaudid, Maalox, Robaxin, Reglan, Compazine, Senokot, Fleet's enema, Ambien.  PHYSICAL EXAMINATION:  VITAL SIGNS:  Temperature maximum 99.2, temperature current 98.4, blood pressure 124/73, pulse of 53, respirations 16, pulse oximetry 96% on 2 liters. GENERAL:  A quite pleasant gentleman in no acute distress. HEENT:  Normocephalic, atraumatic.  Pupils equal, round and reactive to light.  Oropharynx clear. NECK:  Supple. CARDIOVASCULAR:  Regular rate and rhythm.  No murmurs, gallops or rubs heard. LUNGS:  Clear to auscultation. ABDOMEN:  Soft, nondistended. EXTREMITIES:  Left knee is wrapped in a cast.  LABORATORY DATA:  Vancomycin trough 17.1.  Metabolic panel sodium 138, potassium 3.8, chloride 101, bicarbonate 30, BUN and creatinine 13 and 0.67, glucose 104.  CBC with differential white count 8.2, hemoglobin 27.4, platelets 164.  Surgical PCR screen negative for methicillin-resistant Staphylococcus aureus and methicillin-sensitive Staphylococcus aureus.  MICROBIOLOGICAL DATA:  Wound culture on January 16 grew no organisms x2 days.  Anaerobic culture from January 16 no growth of anaerobes to date.  Gram stain showed no organisms with rare white blood cells.  Body  fluid culture on December 2 had grown group B streptococcus in abundance that was sensitive to ampicillin and clindamycin, resistant to erythromycin, sensitive to levofloxacin, penicillin and vancomycin.  Also grew this from a wound culture from the same surgery.  Body fluid culture from July as mentioned grew diphtheroids of two out of two different cultures on December 21 and also December 20.  Body fluid cultures from October 2007 failed to grow any organisms.  IMPRESSION AND RECOMMENDATIONS:  This is a 70 year old Caucasian male with recurrent infections of his left knee where he has had a prosthetic knee arthroplasty that has been removed and where he still had recurrence of infection despite removal of prosthesis and placement of antibiotic spacer.  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  He most recently had grown group B streptococcus, although in the summer had grown diphtheroid from knee culture.  There also is a question of whether he had a cat scratch infection back in 2005 with likely pathogen  being Pasteurella at that time potentially. 1. Prosthetic knee infection recurrence:  The patient had aggressive     surgical care, his prosthesis has been out since this July.     Potential culprit organisms would include the most recent group B     streptococcus, which certainly is the most believable culprit given     that it is a virulent organism and that it was isolated on pure     culture from the knee.  Other less likely culprits would include     the diphtheroid that he had in the summer.  He has no evidence of     any gram-negative infections recently in the knee, although there     is the question of whether he had a Pasteurella infection in 2005.     I am going to opt to continue the patient on vancomycin.  This will     cover the group B streptococcus which he grew from his leg in     December, although it is not as bactericidal a drug as ceftriaxone.      It will, in addition to that, cover organisms that are more     difficult to grow from a site of prosthetic joint infection     including coagulase-negative staphylococcus and diphtheroids.  I     would recommend to continue him on a course of 6 weeks with     vancomycin.  He has a followup appointment with me on February 1 at     10:30 in the morning.  He can keep that appointment but then I     would also like to see him again as he gets to the end of his     course of intravenous vancomycin.  I would then recommend placing     him on an oral agent and I think levofloxacin at 500 mg would be a     good drug.  It would provide activity against the group B     streptococcus that has been found in his knee and is highly     bioavailable with excellent tissue penetration.  I think it would     be a good option for him for the long-term and I would recommend     trying to suppress him with long-term antibiotics.  Certainly wound     care is critical to his preventing recurrent infections and I     suspect that indeed he may having continued seeding of the knee     from his chronic ulcers in the same leg.  I do not think I want to     cover gram-negative pathogens given that we did not isolate any     gram-negative organisms.  I also think that this recurrence of     infection may be due to need for surgical intervention rather than     the organism not being covered.  In any case we will see how he     does on vancomycin.  I will check a sedimentation rate and C-     reactive protein and have them added to the blood work from today     and I will arrange for antibiotics, vancomycin for 6 weeks and have     the patient follow up with me.  Thank you for this Infectious Disease consultation.     Derek Lav, MD     CV/MEDQ  D:  03/29/2010  T:  03/29/2010  Job:  [id number]  Electronically Signed by Paulette Blanch DAM MD on 04/14/2010 10:28:17 AM

## 2010-04-16 ENCOUNTER — Encounter: Payer: Self-pay | Admitting: Cardiology

## 2010-04-22 NOTE — Discharge Summary (Signed)
NAME:  Derek Frye, Derek Frye NO.:  [id number]  MEDICAL RECORD NO.:  [id number]          PATIENT TYPE:  INP  LOCATION:  1617                         FACILITY:  [facility]  PHYSICIAN:  Madlyn Frankel. Charlann Boxer, M.D.  DATE OF BIRTH:  08/03/1940  DATE OF ADMISSION:  03/26/2010 DATE OF DISCHARGE:                              DISCHARGE SUMMARY   BRIEF HISTORY:  This is a 70 year old patient of Dr. Charlann Boxer with multiple medical issues, was admitted on 16th for I and D, and antibiotic spacer replacement for infected left knee nonhealing wound.  ADMITTING DIAGNOSIS:  Chronic left knee infection nonhealing wound despite being on antibiotics.  HOSPITAL COURSE:  He was admitted on 16th, taken to operating theater and underwent an I and D of the left knee with removal and replacement of antibiotic spacer, and application of wound Vac.  The patient has a nonhealing wound, that despite being on IV antibiotics, has failed and we will continue on a different regimen after discharge.  PAST MEDICAL HISTORY: 1. Peripheral vascular disease. 2. Abdominal aortic aneurysm. 3. Coronary artery disease with CABG in 1995 and 1994. 4. Diabetes. 5. Hypertension. 6. Hyperlipidemia. 7. Stasis ulcers.  PAST SURGICAL HISTORY:  Includes left knee replacement with resultant infection as a result of chronic stasis ulcers and open wounds on his left foot and ankle.  The patient was taken from the operating theater to the PACU where he has recovered, brought to [facility] for further recovery and rehabilitation.  He has a wound Vac in place, which he will go home with.  He has had ID consult.  He was placed on 1250 mg of IV vancomycin q.12 h., which he will be discharged on.  The patient will be following up with Dr. Daiva Eves, on February 1st and follow up with Dr. Charlann Boxer next week, on January 23rd for wound check.  He is in a removable cast splint.  The patient's discharge condition is good.  His current  laboratories showed his ESR was 124.  His sodium is 138, his potassium is 3.7, and his BUN and creatinine 16 and 0.7.  His blood sugar is 123.  His hemoglobin at this point is 9.1, his hematocrit is 27.6, his platelets were 158,000.  His INR today is 1.53.  He is afebrile.  His vital signs are stable.  Discharge condition is good.  He will again follow up with Dr. Daiva Eves on the February 1st and follow up with Dr. Charlann Boxer on January 23rd.  He has a wound Vac in place.  He will have dressing changes by home health RN on Monday, Wednesday, and Friday.  Dr. Daiva Eves requested that he has the vancomycin trough with a goal 15 to 20, sent to his office at [phone] weekly, to include CBC and BMP also.  He will continue on the vancomycin for at least 40 days.  He has a PICC line in his left upper extremity. He has advanced his diet to regular.  He was tolerating that without any difficulties, up with assist only.  DISCHARGE MEDICATIONS:  Include acetaminophen 325 mg every 4 hours as needed; Cipro, we  have added 500 mg b.i.d. for 21 days; Colace 100 mg twice daily; and ferrous sulfate 325 mg 3 times a day for 3 weeks.  He has hydrocodone 5/325 one to two p.o. q.4-6 h. pain as needed, vancomycin 1250 q.12 h., Altace 5 mg at noon, aspirin 81 mg, lorazepam 1 mg twice daily, Coumadin 2.5 per protocol, cyclobenzaprine 5 mg 3 times a day as needed, and ceftriaxone 2 g that is Rocephin he has received here, he will not go home on that.  Docusate sodium as needed daily, 40 mg Lasix daily, Glucophage 500 mg twice daily, metolazone 2.5 mg daily, spironolactone 25 mg twice daily, and Zocor 40 mg daily.     Russell L. Webb Silversmith, RN   ______________________________ Madlyn Frankel Charlann Boxer, M.D.    RLW/MEDQ  D:  03/30/2010  T:  03/30/2010  Job:  [id number]  Electronically Signed by Lauree Chandler NP-C on 04/18/2010 09:41:49 AM Electronically Signed by Durene Romans M.D. on 04/22/2010 09:16:25 AM

## 2010-04-23 ENCOUNTER — Encounter: Payer: Self-pay | Admitting: Infectious Disease

## 2010-04-25 ENCOUNTER — Encounter: Payer: Self-pay | Admitting: Infectious Disease

## 2010-04-26 NOTE — Medication Information (Signed)
Summary: Coumadin Clinic  Anticoagulant Therapy  Managed by: Weston Brass, PharmD Referring MD: Willa Rough MD PCP: Benedetto Goad, MD Supervising MD: Jens Som MD, Arlys John Indication 1: Atrial Fibrillation (ICD-427.31) Lab Used: [facility] Site: [address] PT 24.8 INR POC 2.29 INR RANGE 2 - 3  Dietary changes: no    Health status changes: no    Bleeding/hemorrhagic complications: no    Recent/future hospitalizations: no    Any changes in medication regimen? yes       Details: still on Cipro and IV vancomycin  Recent/future dental: no  Any missed doses?: no       Is patient compliant with meds? yes       Allergies: 1)  ! Morphine 2)  ! Amoxicillin  Anticoagulation Management History:      His anticoagulation is being managed by telephone today.  Positive risk factors for bleeding include an age of 72 years or older.  The bleeding index is 'intermediate risk'.  Negative CHADS2 values include Age > 72 years old.  The start date was 11/24/2007.  His last INR was 2.7.  Prothrombin time is 24.8.  Anticoagulation responsible provider: Jens Som MD, Arlys John.  INR POC: 2.29.  Exp: 10/2010.    Anticoagulation Management Assessment/Plan:      The patient's current anticoagulation dose is Coumadin 2.5 mg tabs: Take as directed by coumadin clinic..  The target INR is 2 - 3.  The next INR is due 04/30/2010.  Anticoagulation instructions were given to patient.  Results were reviewed/authorized by Weston Brass, PharmD.  He was notified by Weston Brass PharmD.         Prior Anticoagulation Instructions: INR 2.09  Called spoke with pt advised to continue on same dosage 1 tablet daily except 1/2 tablet on Mondays, Wednesdays, and Fridays. Recheck in 1 week.    Called [facility] spoke with Hillery Aldo advised to recheck in 1 week.    Current Anticoagulation Instructions: INR 2.29  Spoke with pt.  Continue same dose of 1 tablet every day except 1/2 tablet on Monday, Wednesday  and Friday. Recheck INR in 2 weeks.  Called instructions to [place] at [facility]. Weston Brass PharmD  April 16, 2010 4:51 PM

## 2010-04-30 ENCOUNTER — Encounter: Payer: Self-pay | Admitting: Infectious Disease

## 2010-04-30 ENCOUNTER — Encounter: Payer: Self-pay | Admitting: Cardiovascular Disease

## 2010-04-30 LAB — CONVERTED CEMR LAB: POC INR: 3.08

## 2010-05-01 ENCOUNTER — Telehealth: Payer: Self-pay | Admitting: Infectious Disease

## 2010-05-01 ENCOUNTER — Encounter: Payer: Self-pay | Admitting: Infectious Disease

## 2010-05-08 NOTE — Miscellaneous (Signed)
Summary: [facility]   Imported By: Florinda Marker 05/01/2010 15:12:19  _____________________________________________________________________  External Attachment:    Type:   Image     Comment:   External Document

## 2010-05-08 NOTE — Letter (Signed)
Summary: [facility]   Imported By: Marylou Mccoy 05/02/2010 09:32:00  _____________________________________________________________________  External Attachment:    Type:   Image     Comment:   External Document

## 2010-05-08 NOTE — Progress Notes (Signed)
Summary: Care Plan Oversight  Phone Note Outgoing Call   Call placed by: Acey Lav MD,  May 01, 2010 5:49 PM Details for Reason: Care Plan OVersight Summary of Call: [phone] (30 or more mins)  I have supervised home care and/or infusion therapy for this pt, including providing orders for care, review of labs and/or home health care plans, communicating with the home health care professionals and/or patient/caregivers to integrate current information into the medical treatment plan and/or adjust the medical therapy. This supervision has been provided for _32__minutes during the calendar month. Dates for this oversight January 20th, 2012 thru April 29, 2010  Treatment for prosthetic joint infection   Initial call taken by: Acey Lav MD,  May 01, 2010 5:50 PM

## 2010-05-08 NOTE — Medication Information (Signed)
Summary: Coumadin Clinic  Anticoagulant Therapy  Managed by: Bethena Midget, RN, BSN Referring MD: Willa Rough MD PCP: Benedetto Goad, MD Supervising MD: Clifton James MD, Cristal Deer Indication 1: Atrial Fibrillation (ICD-427.31) Lab Used: [facility] [place] Site: [address] PT 31.1 INR POC 3.08 INR RANGE 2 - 3  Dietary changes: no    Health status changes: no    Bleeding/hemorrhagic complications: no    Recent/future hospitalizations: no    Any changes in medication regimen? yes       Details: on cipro 500mg s BID since hosp D/C on 03/30/10  Recent/future dental: no  Any missed doses?: no       Is patient compliant with meds? yes       Allergies: 1)  ! Morphine 2)  ! Amoxicillin  Anticoagulation Management History:      His anticoagulation is being managed by telephone today.  Positive risk factors for bleeding include an age of 72 years or older.  The bleeding index is 'intermediate risk'.  Negative CHADS2 values include Age > 72 years old.  The start date was 11/24/2007.  His last INR was 2.7.  Prothrombin time is 31.1.  Anticoagulation responsible provider: Clifton James MD, Cristal Deer.  INR POC: 3.08.    Anticoagulation Management Assessment/Plan:      The patient's current anticoagulation dose is Coumadin 2.5 mg tabs: Take as directed by coumadin clinic..  The target INR is 2 - 3.  The next INR is due 05/14/2010.  Anticoagulation instructions were given to spouse.  Results were reviewed/authorized by Bethena Midget, RN, BSN.  He was notified by Bethena Midget, RN, BSN.         Prior Anticoagulation Instructions: INR 2.29  Spoke with pt.  Continue same dose of 1 tablet every day except 1/2 tablet on Monday, Wednesday and Friday. Recheck INR in 2 weeks.  Called instructions to [place] at [facility]. Weston Brass PharmD  April 16, 2010 4:51 PM   Current Anticoagulation Instructions: INR 3.08 Tomorrow only take 1.25mg s , then resume 2.5mg s daily except  1.25mg s on Mondays, Wednesdays and Fridays. Recheck in 2 weeks. Orders given to [facility]- Hillery Aldo

## 2010-05-08 NOTE — Progress Notes (Signed)
Summary: Care Plan Ovesight  Phone Note Outgoing Call   Call placed by: Acey Lav MD,  May 01, 2010 6:00 PM Details for Reason: Care Plan Oversight Summary of Call: [phone] (30 or more mins)  I have supervised home care and/or infusion therapy for this pt, including providing orders for care, review of labs and/or home health care plans, communicating with the home health care professionals and/or patient/caregivers to integrate current information into the medical treatment plan and/or adjust the medical therapy. This supervision has been provided for _32__minutes during the calendar month. Dates for this oversight February 20th h, 2012 thru May 28, 2010  Treatment for prosthetic joint infection  Initial call taken by: Acey Lav MD,  May 01, 2010 6:01 PM

## 2010-05-15 ENCOUNTER — Encounter: Payer: Self-pay | Admitting: *Deleted

## 2010-05-15 ENCOUNTER — Telehealth (INDEPENDENT_AMBULATORY_CARE_PROVIDER_SITE_OTHER): Payer: Self-pay | Admitting: *Deleted

## 2010-05-16 ENCOUNTER — Encounter: Payer: Self-pay | Admitting: Internal Medicine

## 2010-05-16 LAB — CONVERTED CEMR LAB: POC INR: 2.19

## 2010-05-21 ENCOUNTER — Telehealth: Payer: Self-pay | Admitting: Infectious Disease

## 2010-05-21 LAB — BASIC METABOLIC PANEL
BUN: 8 mg/dL (ref 6–23)
CO2: 33 mEq/L — ABNORMAL HIGH (ref 19–32)
CO2: 34 mEq/L — ABNORMAL HIGH (ref 19–32)
Calcium: 8.8 mg/dL (ref 8.4–10.5)
Chloride: 93 mEq/L — ABNORMAL LOW (ref 96–112)
Creatinine, Ser: 0.63 mg/dL (ref 0.4–1.5)
GFR calc Af Amer: >60 mL/min (ref >60)
GFR calc Af Amer: >60 mL/min (ref >60)
GFR calc Af Amer: >60 mL/min (ref >60)
GFR calc non Af Amer: >60 mL/min (ref >60)
Glucose, Bld: 131 mg/dL — ABNORMAL HIGH (ref 70–99)
Potassium: 3.2 mEq/L — ABNORMAL LOW (ref 3.5–5.1)
Potassium: 3.4 mEq/L — ABNORMAL LOW (ref 3.5–5.1)
Sodium: 134 mEq/L — ABNORMAL LOW (ref 135–145)
Sodium: 136 mEq/L (ref 135–145)

## 2010-05-21 LAB — GLUCOSE, CAPILLARY
Glucose-Capillary: 107 mg/dL — ABNORMAL HIGH (ref 70–99)
Glucose-Capillary: 109 mg/dL — ABNORMAL HIGH (ref 70–99)
Glucose-Capillary: 111 mg/dL — ABNORMAL HIGH (ref 70–99)
Glucose-Capillary: 125 mg/dL — ABNORMAL HIGH (ref 70–99)
Glucose-Capillary: 135 mg/dL — ABNORMAL HIGH (ref 70–99)
Glucose-Capillary: 144 mg/dL — ABNORMAL HIGH (ref 70–99)
Glucose-Capillary: 146 mg/dL — ABNORMAL HIGH (ref 70–99)
Glucose-Capillary: 152 mg/dL — ABNORMAL HIGH (ref 70–99)
Glucose-Capillary: 161 mg/dL — ABNORMAL HIGH (ref 70–99)

## 2010-05-21 LAB — GRAM STAIN

## 2010-05-21 LAB — ANAEROBIC CULTURE

## 2010-05-21 LAB — CBC
HCT: 27.7 % — ABNORMAL LOW (ref 39.0–52.0)
Hemoglobin: 9.1 g/dL — ABNORMAL LOW (ref 13.0–17.0)
Hemoglobin: 9.1 g/dL — ABNORMAL LOW (ref 13.0–17.0)
MCH: 28 pg (ref 26.0–34.0)
MCHC: 32.9 g/dL (ref 30.0–36.0)
MCV: 84 fL (ref 78.0–100.0)
RBC: 3.25 MIL/uL — ABNORMAL LOW (ref 4.22–5.81)
RBC: 3.26 MIL/uL — ABNORMAL LOW (ref 4.22–5.81)
WBC: 10.3 10*3/uL (ref 4.0–10.5)

## 2010-05-21 LAB — WOUND CULTURE

## 2010-05-21 LAB — PREPARE FRESH FROZEN PLASMA

## 2010-05-21 LAB — PROTIME-INR: INR: 1.52 — ABNORMAL HIGH (ref 0.00–1.49)

## 2010-05-21 LAB — HEMOGLOBIN AND HEMATOCRIT, BLOOD
HCT: 28.2 % — ABNORMAL LOW (ref 39.0–52.0)
Hemoglobin: 9.2 g/dL — ABNORMAL LOW (ref 13.0–17.0)

## 2010-05-21 LAB — BODY FLUID CULTURE

## 2010-05-22 LAB — BASIC METABOLIC PANEL
BUN: 18 mg/dL (ref 6–23)
CO2: 33 mEq/L — ABNORMAL HIGH (ref 19–32)
Chloride: 91 mEq/L — ABNORMAL LOW (ref 96–112)
Creatinine, Ser: 0.89 mg/dL (ref 0.4–1.5)
Glucose, Bld: 124 mg/dL — ABNORMAL HIGH (ref 70–99)
Potassium: 3.4 mEq/L — ABNORMAL LOW (ref 3.5–5.1)

## 2010-05-22 LAB — DIFFERENTIAL
Basophils Absolute: 0 10*3/uL (ref 0.0–0.1)
Basophils Relative: 0 % (ref 0–1)
Eosinophils Absolute: 0.1 10*3/uL (ref 0.0–0.7)
Eosinophils Relative: 1 % (ref 0–5)
Lymphocytes Relative: 17 % (ref 12–46)
Monocytes Absolute: 0.9 10*3/uL (ref 0.1–1.0)

## 2010-05-22 LAB — CBC
HCT: 34.2 % — ABNORMAL LOW (ref 39.0–52.0)
MCH: 28.4 pg (ref 26.0–34.0)
MCHC: 33.6 g/dL (ref 30.0–36.0)
MCV: 84.4 fL (ref 78.0–100.0)
Platelets: 385 10*3/uL (ref 150–400)
RDW: 15.3 % (ref 11.5–15.5)

## 2010-05-22 LAB — URINALYSIS, ROUTINE W REFLEX MICROSCOPIC
Glucose, UA: NEGATIVE mg/dL
Protein, ur: NEGATIVE mg/dL
Specific Gravity, Urine: 1.022 (ref 1.005–1.030)
Urobilinogen, UA: 1 mg/dL (ref 0.0–1.0)

## 2010-05-22 LAB — PROTIME-INR: Prothrombin Time: 27.3 seconds — ABNORMAL HIGH (ref 11.6–15.2)

## 2010-05-22 LAB — TYPE AND SCREEN: ABO/RH(D): O POS

## 2010-05-22 LAB — SURGICAL PCR SCREEN: MRSA, PCR: NEGATIVE

## 2010-05-22 NOTE — Medication Information (Signed)
Summary: Coumadin Clinic  Anticoagulant Therapy  Managed by: Windell Hummingbird, RN Referring MD: Willa Rough MD PCP: Benedetto Goad, MD Supervising MD: Graciela Husbands MD, Viviann Spare Indication 1: Atrial Fibrillation (ICD-427.31) Lab Used: [facility] [place] Site: [address] PT 24.5 INR POC 2.19 INR RANGE 2 - 3  Dietary changes: no    Health status changes: no    Bleeding/hemorrhagic complications: no    Recent/future hospitalizations: no    Any changes in medication regimen? no    Recent/future dental: no  Any missed doses?: no       Is patient compliant with meds? yes       Allergies: 1)  ! Morphine 2)  ! Amoxicillin  Anticoagulation Management History:      His anticoagulation is being managed by telephone today.  Positive risk factors for bleeding include an age of 48 years or older.  The bleeding index is 'intermediate risk'.  Negative CHADS2 values include Age > 54 years old.  The start date was 11/24/2007.  His last INR was 2.7.  Prothrombin time is 24.5.  Anticoagulation responsible provider: Graciela Husbands MD, Viviann Spare.  INR POC: 2.19.    Anticoagulation Management Assessment/Plan:      The patient's current anticoagulation dose is Coumadin 2.5 mg tabs: Take as directed by coumadin clinic..  The target INR is 2 - 3.  The next INR is due 06/13/2010.  Anticoagulation instructions were given to spouse.  Results were reviewed/authorized by Windell Hummingbird, RN.  He was notified by Windell Hummingbird, RN.         Prior Anticoagulation Instructions: INR 3.08 Tomorrow only take 1.25mg s , then resume 2.5mg s daily except 1.25mg s on Mondays, Wednesdays and Fridays. Recheck in 2 weeks. Orders given to [facility]- Hillery Aldo   Current Anticoagulation Instructions: INR 2.19 Continue taking 1 tablet (2.5 mg) every day, except take 1/2 tablet (1.25 mg) on Mondays, Wednesdays, and Fridays. Recheck in 4 weeks. Spoke w/ pt. and gave instructions.  Orders given to [facility]- Lavonna Rua

## 2010-05-22 NOTE — Progress Notes (Signed)
Summary: INR result from [facility]  Phone Note Outgoing Call   Call placed by: Bethena Midget, RN, BSN,  May 15, 2010 3:06 PM Call placed to: [facility]- Vertis Kelch Details for Reason: INR result  Summary of Call: [facility] and spoke with Hillery Aldo who received orders for INR on 05/14/10. She states that it appears INR was not drawn and will have nurse draw on 05/16/10.

## 2010-05-23 ENCOUNTER — Telehealth: Payer: Self-pay | Admitting: Infectious Disease

## 2010-05-24 ENCOUNTER — Encounter: Payer: Self-pay | Admitting: Cardiology

## 2010-05-25 ENCOUNTER — Telehealth: Payer: Self-pay | Admitting: Infectious Disease

## 2010-05-26 LAB — GLUCOSE, CAPILLARY
Glucose-Capillary: 155 mg/dL — ABNORMAL HIGH (ref 70–99)
Glucose-Capillary: 209 mg/dL — ABNORMAL HIGH (ref 70–99)
Glucose-Capillary: 60 mg/dL — ABNORMAL LOW (ref 70–99)
Glucose-Capillary: 67 mg/dL — ABNORMAL LOW (ref 70–99)
Glucose-Capillary: 111 mg/dL — ABNORMAL HIGH (ref 70–99)
Glucose-Capillary: 112 mg/dL — ABNORMAL HIGH (ref 70–99)
Glucose-Capillary: 116 mg/dL — ABNORMAL HIGH (ref 70–99)
Glucose-Capillary: 121 mg/dL — ABNORMAL HIGH (ref 70–99)
Glucose-Capillary: 124 mg/dL — ABNORMAL HIGH (ref 70–99)
Glucose-Capillary: 130 mg/dL — ABNORMAL HIGH (ref 70–99)
Glucose-Capillary: 137 mg/dL — ABNORMAL HIGH (ref 70–99)
Glucose-Capillary: 139 mg/dL — ABNORMAL HIGH (ref 70–99)
Glucose-Capillary: 141 mg/dL — ABNORMAL HIGH (ref 70–99)
Glucose-Capillary: 143 mg/dL — ABNORMAL HIGH (ref 70–99)
Glucose-Capillary: 150 mg/dL — ABNORMAL HIGH (ref 70–99)
Glucose-Capillary: 170 mg/dL — ABNORMAL HIGH (ref 70–99)
Glucose-Capillary: 171 mg/dL — ABNORMAL HIGH (ref 70–99)
Glucose-Capillary: 68 mg/dL — ABNORMAL LOW (ref 70–99)
Glucose-Capillary: 71 mg/dL (ref 70–99)
Glucose-Capillary: 94 mg/dL (ref 70–99)
Glucose-Capillary: 98 mg/dL (ref 70–99)

## 2010-05-26 LAB — CBC
HCT: 24.8 % — ABNORMAL LOW (ref 39.0–52.0)
HCT: 27.2 % — ABNORMAL LOW (ref 39.0–52.0)
Hemoglobin: 8.3 g/dL — ABNORMAL LOW (ref 13.0–17.0)
Hemoglobin: 8.7 g/dL — ABNORMAL LOW (ref 13.0–17.0)
Hemoglobin: 8.8 g/dL — ABNORMAL LOW (ref 13.0–17.0)
Hemoglobin: 9 g/dL — ABNORMAL LOW (ref 13.0–17.0)
MCH: 32.1 pg (ref 26.0–34.0)
MCH: 32.2 pg (ref 26.0–34.0)
MCH: 32.5 pg (ref 26.0–34.0)
MCH: 32.8 pg (ref 26.0–34.0)
MCH: 33.5 pg (ref 26.0–34.0)
MCHC: 34.3 g/dL (ref 30.0–36.0)
MCHC: 33.8 g/dL (ref 30.0–36.0)
MCHC: 34.2 g/dL (ref 30.0–36.0)
MCHC: 34.6 g/dL (ref 30.0–36.0)
MCHC: 35 g/dL (ref 30.0–36.0)
MCHC: 35.6 g/dL (ref 30.0–36.0)
MCV: 94.1 fL (ref 78.0–100.0)
MCV: 94.3 fL (ref 78.0–100.0)
MCV: 96.7 fL (ref 78.0–100.0)
Platelets: 116 10*3/uL — ABNORMAL LOW (ref 150–400)
Platelets: 116 10*3/uL — ABNORMAL LOW (ref 150–400)
Platelets: 140 10*3/uL — ABNORMAL LOW (ref 150–400)
Platelets: 156 10*3/uL (ref 150–400)
Platelets: 201 10*3/uL (ref 150–400)
Platelets: 270 10*3/uL (ref 150–400)
RBC: 2.57 MIL/uL — ABNORMAL LOW (ref 4.22–5.81)
RBC: 2.66 MIL/uL — ABNORMAL LOW (ref 4.22–5.81)
RBC: 2.66 MIL/uL — ABNORMAL LOW (ref 4.22–5.81)
RBC: 2.66 MIL/uL — ABNORMAL LOW (ref 4.22–5.81)
RBC: 2.89 MIL/uL — ABNORMAL LOW (ref 4.22–5.81)
RBC: 3.03 MIL/uL — ABNORMAL LOW (ref 4.22–5.81)
RDW: 16.5 % — ABNORMAL HIGH (ref 11.5–15.5)
RDW: 16 % — ABNORMAL HIGH (ref 11.5–15.5)
RDW: 16 % — ABNORMAL HIGH (ref 11.5–15.5)
RDW: 16.5 % — ABNORMAL HIGH (ref 11.5–15.5)
RDW: 16.8 % — ABNORMAL HIGH (ref 11.5–15.5)
WBC: 12 10*3/uL — ABNORMAL HIGH (ref 4.0–10.5)
WBC: 13.2 10*3/uL — ABNORMAL HIGH (ref 4.0–10.5)
WBC: 8.4 10*3/uL (ref 4.0–10.5)
WBC: 9 10*3/uL (ref 4.0–10.5)

## 2010-05-26 LAB — PROTIME-INR
INR: 2.61 — ABNORMAL HIGH (ref 0.00–1.49)
INR: 1.51 — ABNORMAL HIGH (ref 0.00–1.49)
INR: 1.72 — ABNORMAL HIGH (ref 0.00–1.49)
INR: 1.76 — ABNORMAL HIGH (ref 0.00–1.49)
INR: 2.4 — ABNORMAL HIGH (ref 0.00–1.49)
Prothrombin Time: 18 seconds — ABNORMAL HIGH (ref 11.6–15.2)
Prothrombin Time: 18.1 seconds — ABNORMAL HIGH (ref 11.6–15.2)
Prothrombin Time: 20 seconds — ABNORMAL HIGH (ref 11.6–15.2)
Prothrombin Time: 20.4 seconds — ABNORMAL HIGH (ref 11.6–15.2)
Prothrombin Time: 23.8 seconds — ABNORMAL HIGH (ref 11.6–15.2)
Prothrombin Time: 26 seconds — ABNORMAL HIGH (ref 11.6–15.2)
Prothrombin Time: 29.5 seconds — ABNORMAL HIGH (ref 11.6–15.2)

## 2010-05-26 LAB — BASIC METABOLIC PANEL
BUN: 13 mg/dL (ref 6–23)
BUN: 17 mg/dL (ref 6–23)
CO2: 25 mEq/L (ref 19–32)
CO2: 26 mEq/L (ref 19–32)
Calcium: 8.1 mg/dL — ABNORMAL LOW (ref 8.4–10.5)
Calcium: 8.3 mg/dL — ABNORMAL LOW (ref 8.4–10.5)
Calcium: 8.4 mg/dL (ref 8.4–10.5)
Calcium: 8.9 mg/dL (ref 8.4–10.5)
Chloride: 102 mEq/L (ref 96–112)
Chloride: 105 mEq/L (ref 96–112)
Creatinine, Ser: 0.74 mg/dL (ref 0.4–1.5)
Creatinine, Ser: 0.77 mg/dL (ref 0.4–1.5)
GFR calc Af Amer: >60 mL/min (ref >60)
GFR calc Af Amer: >60 mL/min (ref >60)
GFR calc Af Amer: >60 mL/min (ref >60)
GFR calc non Af Amer: >60 mL/min (ref >60)
GFR calc non Af Amer: >60 mL/min (ref >60)
Glucose, Bld: 138 mg/dL — ABNORMAL HIGH (ref 70–99)
Glucose, Bld: 152 mg/dL — ABNORMAL HIGH (ref 70–99)
Potassium: 3.7 mEq/L (ref 3.5–5.1)
Sodium: 133 mEq/L — ABNORMAL LOW (ref 135–145)
Sodium: 138 mEq/L (ref 135–145)

## 2010-05-26 LAB — COMPREHENSIVE METABOLIC PANEL
ALT: 34 U/L (ref 0–53)
AST: 32 U/L (ref 0–37)
Albumin: 3.4 g/dL — ABNORMAL LOW (ref 3.5–5.2)
BUN: 27 mg/dL — ABNORMAL HIGH (ref 6–23)
CO2: 22 mEq/L (ref 19–32)
Calcium: 8.6 mg/dL (ref 8.4–10.5)
Chloride: 100 mEq/L (ref 96–112)
Creatinine, Ser: 1.2 mg/dL (ref 0.4–1.5)
GFR calc Af Amer: >60 mL/min (ref >60)
GFR calc non Af Amer: 60 mL/min — ABNORMAL LOW (ref >60)
Glucose, Bld: 138 mg/dL — ABNORMAL HIGH (ref 70–99)
Sodium: 132 mEq/L — ABNORMAL LOW (ref 135–145)
Total Bilirubin: 1.2 mg/dL (ref 0.3–1.2)
Total Protein: 6.4 g/dL (ref 6.0–8.3)

## 2010-05-26 LAB — CROSSMATCH

## 2010-05-26 LAB — BODY FLUID CULTURE

## 2010-05-26 LAB — SEDIMENTATION RATE: Sed Rate: 134 mm/hr — ABNORMAL HIGH (ref 0–16)

## 2010-05-26 LAB — CK TOTAL AND CKMB (NOT AT ARMC)
CK, MB: 2.6 ng/mL (ref 0.3–4.0)
CK, MB: 2.7 ng/mL (ref 0.3–4.0)
Total CK: 181 U/L (ref 7–232)
Total CK: 206 U/L (ref 7–232)

## 2010-05-26 LAB — ANAEROBIC CULTURE

## 2010-05-26 LAB — MAGNESIUM
Magnesium: 1.6 mg/dL (ref 1.5–2.5)
Magnesium: 1.6 mg/dL (ref 1.5–2.5)

## 2010-05-26 LAB — DIFFERENTIAL
Eosinophils Absolute: 0 10*3/uL (ref 0.0–0.7)
Lymphs Abs: 1.2 10*3/uL (ref 0.7–4.0)
Neutrophils Relative %: 76 % (ref 43–77)

## 2010-05-26 LAB — BRAIN NATRIURETIC PEPTIDE: Pro B Natriuretic peptide (BNP): 240 pg/mL — ABNORMAL HIGH (ref 0.0–100.0)

## 2010-05-26 LAB — SYNOVIAL CELL COUNT + DIFF, W/ CRYSTALS
Lymphocytes-Synovial Fld: 22 % — ABNORMAL HIGH (ref 0–20)
Monocyte-Macrophage-Synovial Fluid: 9 % — ABNORMAL LOW (ref 50–90)

## 2010-05-26 LAB — VANCOMYCIN, TROUGH: Vancomycin Tr: 14.3 ug/mL (ref 10.0–20.0)

## 2010-05-26 LAB — HEPARIN LEVEL (UNFRACTIONATED)
Heparin Unfractionated: 0.11 IU/mL — ABNORMAL LOW (ref 0.30–0.70)
Heparin Unfractionated: <0.1 IU/mL — ABNORMAL LOW (ref 0.30–0.70)

## 2010-05-26 LAB — HEMOGLOBIN A1C
Hgb A1c MFr Bld: 6.1 % — ABNORMAL HIGH (ref <5.7)
Mean Plasma Glucose: 128 mg/dL — ABNORMAL HIGH (ref <117)

## 2010-05-26 LAB — C-REACTIVE PROTEIN: CRP: 21.9 mg/dL — ABNORMAL HIGH (ref <0.6)

## 2010-05-26 LAB — TROPONIN I
Troponin I: 0.03 ng/mL (ref 0.00–0.06)
Troponin I: 0.03 ng/mL (ref 0.00–0.06)

## 2010-05-26 LAB — GLUCOSE, SYNOVIAL FLUID: Glucose, Synovial Fluid: <5 mg/dL

## 2010-05-27 LAB — GLUCOSE, CAPILLARY
Glucose-Capillary: 74 mg/dL (ref 70–99)
Glucose-Capillary: 103 mg/dL — ABNORMAL HIGH (ref 70–99)
Glucose-Capillary: 105 mg/dL — ABNORMAL HIGH (ref 70–99)
Glucose-Capillary: 111 mg/dL — ABNORMAL HIGH (ref 70–99)
Glucose-Capillary: 118 mg/dL — ABNORMAL HIGH (ref 70–99)
Glucose-Capillary: 126 mg/dL — ABNORMAL HIGH (ref 70–99)
Glucose-Capillary: 158 mg/dL — ABNORMAL HIGH (ref 70–99)
Glucose-Capillary: 176 mg/dL — ABNORMAL HIGH (ref 70–99)
Glucose-Capillary: 181 mg/dL — ABNORMAL HIGH (ref 70–99)
Glucose-Capillary: 81 mg/dL (ref 70–99)
Glucose-Capillary: 81 mg/dL (ref 70–99)

## 2010-05-27 LAB — COMPREHENSIVE METABOLIC PANEL
ALT: 26 U/L (ref 0–53)
ALT: 31 U/L (ref 0–53)
ALT: 32 U/L (ref 0–53)
AST: 27 U/L (ref 0–37)
AST: 28 U/L (ref 0–37)
Albumin: 3 g/dL — ABNORMAL LOW (ref 3.5–5.2)
Albumin: 3.3 g/dL — ABNORMAL LOW (ref 3.5–5.2)
Alkaline Phosphatase: 118 U/L — ABNORMAL HIGH (ref 39–117)
Alkaline Phosphatase: 84 U/L (ref 39–117)
Alkaline Phosphatase: 85 U/L (ref 39–117)
BUN: 23 mg/dL (ref 6–23)
BUN: 25 mg/dL — ABNORMAL HIGH (ref 6–23)
BUN: 25 mg/dL — ABNORMAL HIGH (ref 6–23)
CO2: 29 mEq/L (ref 19–32)
CO2: 30 mEq/L (ref 19–32)
Calcium: 8.4 mg/dL (ref 8.4–10.5)
Calcium: 8.4 mg/dL (ref 8.4–10.5)
Chloride: 100 mEq/L (ref 96–112)
Chloride: 98 mEq/L (ref 96–112)
Creatinine, Ser: 0.99 mg/dL (ref 0.4–1.5)
Creatinine, Ser: 1.11 mg/dL (ref 0.4–1.5)
GFR calc Af Amer: >60 mL/min (ref >60)
GFR calc Af Amer: >60 mL/min (ref >60)
GFR calc non Af Amer: >60 mL/min (ref >60)
GFR calc non Af Amer: >60 mL/min (ref >60)
Glucose, Bld: 107 mg/dL — ABNORMAL HIGH (ref 70–99)
Glucose, Bld: 92 mg/dL (ref 70–99)
Potassium: 3.1 mEq/L — ABNORMAL LOW (ref 3.5–5.1)
Potassium: 3.5 mEq/L (ref 3.5–5.1)
Sodium: 137 mEq/L (ref 135–145)
Sodium: 143 mEq/L (ref 135–145)
Total Bilirubin: 2.5 mg/dL — ABNORMAL HIGH (ref 0.3–1.2)
Total Bilirubin: 2.5 mg/dL — ABNORMAL HIGH (ref 0.3–1.2)
Total Protein: 6.4 g/dL (ref 6.0–8.3)
Total Protein: 6.5 g/dL (ref 6.0–8.3)

## 2010-05-27 LAB — PROTIME-INR
INR: 2.8 — ABNORMAL HIGH (ref 0.00–1.49)
INR: 2.06 — ABNORMAL HIGH (ref 0.00–1.49)
Prothrombin Time: 29.3 seconds — ABNORMAL HIGH (ref 11.6–15.2)
Prothrombin Time: 22.6 seconds — ABNORMAL HIGH (ref 11.6–15.2)
Prothrombin Time: 23 seconds — ABNORMAL HIGH (ref 11.6–15.2)

## 2010-05-27 LAB — BASIC METABOLIC PANEL
Calcium: 9.3 mg/dL (ref 8.4–10.5)
GFR calc Af Amer: >60 mL/min (ref >60)
GFR calc non Af Amer: 59 mL/min — ABNORMAL LOW (ref >60)
Glucose, Bld: 61 mg/dL — ABNORMAL LOW (ref 70–99)
Potassium: 4.4 mEq/L (ref 3.5–5.1)
Sodium: 136 mEq/L (ref 135–145)

## 2010-05-27 LAB — CBC
HCT: 31.7 % — ABNORMAL LOW (ref 39.0–52.0)
HCT: 32.3 % — ABNORMAL LOW (ref 39.0–52.0)
HCT: 35.9 % — ABNORMAL LOW (ref 39.0–52.0)
Hemoglobin: 11.4 g/dL — ABNORMAL LOW (ref 13.0–17.0)
Hemoglobin: 11 g/dL — ABNORMAL LOW (ref 13.0–17.0)
Hemoglobin: 11.4 g/dL — ABNORMAL LOW (ref 13.0–17.0)
Hemoglobin: 12 g/dL — ABNORMAL LOW (ref 13.0–17.0)
MCHC: 33.8 g/dL (ref 30.0–36.0)
MCHC: 33.3 g/dL (ref 30.0–36.0)
MCHC: 34.2 g/dL (ref 30.0–36.0)
MCV: 100.9 fL — ABNORMAL HIGH (ref 78.0–100.0)
MCV: 97.5 fL (ref 78.0–100.0)
MCV: 98.4 fL (ref 78.0–100.0)
Platelets: 117 10*3/uL — ABNORMAL LOW (ref 150–400)
RBC: 3.2 MIL/uL — ABNORMAL LOW (ref 4.22–5.81)
RBC: 3.45 MIL/uL — ABNORMAL LOW (ref 4.22–5.81)
RBC: 3.71 MIL/uL — ABNORMAL LOW (ref 4.22–5.81)
RDW: 17.8 % — ABNORMAL HIGH (ref 11.5–15.5)
WBC: 10.4 10*3/uL (ref 4.0–10.5)
WBC: 10.4 10*3/uL (ref 4.0–10.5)

## 2010-05-27 LAB — URINALYSIS, ROUTINE W REFLEX MICROSCOPIC
Bilirubin Urine: NEGATIVE
Hgb urine dipstick: NEGATIVE
Specific Gravity, Urine: 1.018 (ref 1.005–1.030)
pH: 6 (ref 5.0–8.0)

## 2010-05-27 LAB — DIFFERENTIAL
Basophils Absolute: 0.1 10*3/uL (ref 0.0–0.1)
Basophils Absolute: 0 10*3/uL (ref 0.0–0.1)
Basophils Relative: 1 % (ref 0–1)
Basophils Relative: 0 % (ref 0–1)
Basophils Relative: 0 % (ref 0–1)
Basophils Relative: 0 % (ref 0–1)
Eosinophils Absolute: 0 10*3/uL (ref 0.0–0.7)
Eosinophils Absolute: 0.1 10*3/uL (ref 0.0–0.7)
Eosinophils Absolute: 0.2 10*3/uL (ref 0.0–0.7)
Eosinophils Relative: 0 % (ref 0–5)
Lymphocytes Relative: 17 % (ref 12–46)
Lymphs Abs: 1.5 10*3/uL (ref 0.7–4.0)
Monocytes Absolute: 0.8 10*3/uL (ref 0.1–1.0)
Monocytes Absolute: 1.1 10*3/uL — ABNORMAL HIGH (ref 0.1–1.0)
Monocytes Relative: 10 % (ref 3–12)
Monocytes Relative: 10 % (ref 3–12)
Monocytes Relative: 6 % (ref 3–12)
Neutro Abs: 7.4 10*3/uL (ref 1.7–7.7)
Neutro Abs: 8.1 10*3/uL — ABNORMAL HIGH (ref 1.7–7.7)
Neutrophils Relative %: 71 % (ref 43–77)
Neutrophils Relative %: 62 % (ref 43–77)
Neutrophils Relative %: 78 % — ABNORMAL HIGH (ref 43–77)

## 2010-05-27 LAB — CULTURE, BLOOD (ROUTINE X 2): Culture: NO GROWTH

## 2010-05-27 LAB — POCT CARDIAC MARKERS: CKMB, poc: 2.2 ng/mL (ref 1.0–8.0)

## 2010-05-27 LAB — HEMOGLOBIN A1C: Mean Plasma Glucose: 137 mg/dL — ABNORMAL HIGH (ref <117)

## 2010-05-27 LAB — MAGNESIUM
Magnesium: 1.5 mg/dL (ref 1.5–2.5)
Magnesium: 1.5 mg/dL (ref 1.5–2.5)

## 2010-05-28 LAB — PROTIME-INR
INR: 2.02 — ABNORMAL HIGH (ref 0.00–1.49)
Prothrombin Time: 22.7 seconds — ABNORMAL HIGH (ref 11.6–15.2)

## 2010-05-28 LAB — POCT I-STAT, CHEM 8
Creatinine, Ser: 1.1 mg/dL (ref 0.4–1.5)
Glucose, Bld: 103 mg/dL — ABNORMAL HIGH (ref 70–99)
Hemoglobin: 13.3 g/dL (ref 13.0–17.0)
TCO2: 36 mmol/L (ref 0–100)

## 2010-05-29 ENCOUNTER — Telehealth: Payer: Self-pay | Admitting: *Deleted

## 2010-05-29 NOTE — Telephone Encounter (Signed)
Wife LM & I called her back. Explained that md sent the rx to [facility] for levaquin & doxy on the 16th. She is concerned that this is 3 antibiotics. States Dr. Charlann Boxer has him on cipro. Also concerned that he has been on antibiotics for over a year. States they have not gotten the meds from [facility]. Told her this was done on the 16th. It should be there soon. To md to address her concerns & I will call her back.Derek Frye

## 2010-05-29 NOTE — Progress Notes (Signed)
Summary: K was hihg on 3/5  Phone Note Outgoing Call   Call placed by: Acey Lav MD,  May 21, 2010 2:00 PM Summary of Call: Pt had k of 5.7 on March 5th but I was never called. I spoke with him by phone today. He has felt fine adn he saw his PCP last Thursday and had blood drawn but he does not know if their. Dr. Ludwig Clarks at [facility].  I wil fax them to make sure that this had been rechecked and is ok. the pt also had labs drawn today but labs are pending. His IV antbiotics are running out on the 21st and he has no followup with me at this point. I will come up with a chronic oral regimen to try and suppress him> Doxyycline and levaquin 750 would be reasonablle. Initial call taken by: Acey Lav MD,  May 21, 2010 2:11 PM

## 2010-05-29 NOTE — Progress Notes (Signed)
Summary: K normal now  Phone Note Outgoing Call   Call placed by: Acey Lav MD,  May 23, 2010 8:33 AM Summary of Call: Repeat K on Monday was 4.3. I left message with pt on his answering machine. I advised return to normal dose of lasix, and ramipril but to hold the aldactone for now. Certainly defer to PCP re this ans will fax this result to them Initial call taken by: Acey Lav MD,  May 23, 2010 8:33 AM

## 2010-05-30 ENCOUNTER — Other Ambulatory Visit: Payer: Self-pay | Admitting: Pharmacist

## 2010-05-30 MED ORDER — WARFARIN SODIUM 2.5 MG PO TABS: 2.5000 mg | ORAL_TABLET | ORAL | Status: DC

## 2010-05-30 NOTE — Telephone Encounter (Signed)
I gave her the message about the cipro & other 2 meds to be stopped NOW. She states she talked with Dr. Daiva Eves & they will get the potassium drawn Friday. She said md told her it was ok to wait until Friday.Derek Frye

## 2010-05-30 NOTE — Telephone Encounter (Signed)
LM that he was to stop the cipro immediately. Told him I had other changes as well. Asked that he call us asap.Derek Frye

## 2010-05-30 NOTE — Telephone Encounter (Signed)
Derek Frye,   I want the patient on the following antibiotics: Levaquin Doxycyline  I DO NOT WANT HIM ON CIPRO ON TOP OF THESE TWO THAT IS DANGEROUS  FINALLY HIS K is again 5.6 I would like him to stop his ramipril and his aldactone and have his K checked with his PCP today I have called and left message with him

## 2010-06-01 ENCOUNTER — Telehealth: Payer: Self-pay | Admitting: *Deleted

## 2010-06-01 NOTE — Telephone Encounter (Addendum)
rec'd message from wife. Her insurance is sending her 30 day supply at a time. This means 3 co-pays for 90 days. Wants to know if the levaquin & doxy will be long term. If so, plz change rx for QS 90 days to help her with co pay costs.Derek Frye

## 2010-06-04 ENCOUNTER — Telehealth: Payer: Self-pay | Admitting: Infectious Disease

## 2010-06-04 DIAGNOSIS — M009 Pyogenic arthritis, unspecified: Secondary | ICD-10-CM

## 2010-06-04 MED ORDER — LEVOFLOXACIN 750 MG PO TABS: 750.0000 mg | ORAL_TABLET | Freq: Every day | ORAL | Status: DC

## 2010-06-04 MED ORDER — DOXYCYCLINE HYCLATE 100 MG PO TABS: 100.0000 mg | ORAL_TABLET | Freq: Two times a day (BID) | ORAL | Status: DC

## 2010-06-04 NOTE — Telephone Encounter (Signed)
I am trying to call them. I have changed the scripts to 90 days. He should RESUME his aldactone and his ramipril

## 2010-06-04 NOTE — Telephone Encounter (Signed)
I spoke with pt & wife. Told them 90 day supply now & to resume the 2 meds md wanted him to take.

## 2010-06-05 NOTE — Telephone Encounter (Signed)
Thanks Sally!

## 2010-06-07 NOTE — Progress Notes (Signed)
Summary: unsure about what antiobiotic to be on  Phone Note Call from Patient   Caller: Spouse Call For: Dr. Daiva Eves Summary of Call: Patient's wife wanted to know what Dr. Daiva Eves decided to do about the antibiotic you wanted to be him on. She would like Dr. Daiva Eves to give her a call. Initial call taken by: Wendall Mola CMA Duncan Dull),  May 25, 2010 5:00 PM  Follow-up for Phone Call        I would like him to be on generic doxycyline 100mg  twice daily and levaquin 750mg  daily Follow-up by: Acey Lav MD,  May 27, 2010 10:50 PM    New/Updated Medications: DOXYCYCLINE HYCLATE 100 MG TABS (DOXYCYCLINE HYCLATE) Take 1 tablet by mouth two times a day LEVOFLOXACIN 750 MG TABS (LEVOFLOXACIN) Take 1 tablet by mouth once a day Prescriptions: LEVOFLOXACIN 750 MG TABS (LEVOFLOXACIN) Take 1 tablet by mouth once a day  #30 x 5   Entered and Authorized by:   Acey Lav MD   Signed by:   Paulette Blanch Dam MD on 05/27/2010   Method used:   Electronically to        [facility]* (retail)             ,          Ph: [phone]       Fax: [phone]   RxID:   [id number] DOXYCYCLINE HYCLATE 100 MG TABS (DOXYCYCLINE HYCLATE) Take 1 tablet by mouth two times a day  #60 x 5   Entered and Authorized by:   Acey Lav MD   Signed by:   Paulette Blanch Dam MD on 05/27/2010   Method used:   Electronically to        [facility]* (retail)             ,          Ph: [phone]       Fax: [phone]   RxID:   [id number]

## 2010-06-08 ENCOUNTER — Telehealth: Payer: Self-pay | Admitting: Infectious Disease

## 2010-06-08 ENCOUNTER — Other Ambulatory Visit: Payer: Self-pay | Admitting: *Deleted

## 2010-06-08 DIAGNOSIS — L03119 Cellulitis of unspecified part of limb: Secondary | ICD-10-CM

## 2010-06-08 DIAGNOSIS — M009 Pyogenic arthritis, unspecified: Secondary | ICD-10-CM

## 2010-06-08 MED ORDER — LEVOFLOXACIN 750 MG PO TABS: 750.0000 mg | ORAL_TABLET | Freq: Every day | ORAL | Status: DC

## 2010-06-08 NOTE — Telephone Encounter (Signed)
Pts. Wife called c/o pt. Having bruising on top of Right thigh and extra swelling in right leg, also pain in hip and neck joints.  Spoke with Dr. Daiva Eves and he recommended pt. To go to ED.  Wife did not feel this was necessary feels the dose of Levofloxin was too high.  Dr. Daiva Eves will call pts. Wife back at [phone].  Also wanted 90 day supply of antibiotics sent to [facility].  Will wait to see if Dr. Daiva Eves is going to leave pt. On these meds.

## 2010-06-11 ENCOUNTER — Other Ambulatory Visit: Payer: Self-pay | Admitting: *Deleted

## 2010-06-11 ENCOUNTER — Ambulatory Visit: Payer: Self-pay | Admitting: Cardiovascular Disease

## 2010-06-11 DIAGNOSIS — M009 Pyogenic arthritis, unspecified: Secondary | ICD-10-CM

## 2010-06-11 DIAGNOSIS — Z7901 Long term (current) use of anticoagulants: Secondary | ICD-10-CM | POA: Insufficient documentation

## 2010-06-11 DIAGNOSIS — Z8679 Personal history of other diseases of the circulatory system: Secondary | ICD-10-CM

## 2010-06-11 DIAGNOSIS — I4891 Unspecified atrial fibrillation: Secondary | ICD-10-CM

## 2010-06-11 DIAGNOSIS — L02419 Cutaneous abscess of limb, unspecified: Secondary | ICD-10-CM

## 2010-06-11 LAB — PROTIME-INR: INR: 1.9 — AB (ref <=1.1)

## 2010-06-11 MED ORDER — LEVOFLOXACIN 750 MG PO TABS: 750.0000 mg | ORAL_TABLET | Freq: Every day | ORAL | Status: DC

## 2010-06-11 MED ORDER — DOXYCYCLINE HYCLATE 100 MG PO TABS: 100.0000 mg | ORAL_TABLET | Freq: Two times a day (BID) | ORAL | Status: AC

## 2010-06-11 NOTE — Telephone Encounter (Signed)
Thanks Annice Pih. I spoke to his wife and they were going to followup with pcp re the inr level. I explained that the levaquin was not likely causing the symptoms she was desribing and no role for reduced dose.

## 2010-06-11 NOTE — Patient Instructions (Signed)
Called spoke with pt advised to take 1 tablet today, then resume same dosage 1 tablet daily except 1/2 tablet Mondays, Wednesdays, and Fridays.  Recheck in 1 week. Called [facility] spoke with Morrie Sheldon, RN gave verbal orders to recheck on 06/18/10.

## 2010-06-18 ENCOUNTER — Encounter: Payer: Self-pay | Admitting: Cardiology

## 2010-06-18 ENCOUNTER — Ambulatory Visit (INDEPENDENT_AMBULATORY_CARE_PROVIDER_SITE_OTHER): Payer: Self-pay | Admitting: Cardiology

## 2010-06-18 DIAGNOSIS — I4891 Unspecified atrial fibrillation: Secondary | ICD-10-CM

## 2010-06-18 LAB — PROTIME-INR

## 2010-06-19 ENCOUNTER — Observation Stay (HOSPITAL_COMMUNITY)
Admission: EM | Admit: 2010-06-19 | Discharge: 2010-06-21 | Disposition: A | Discharge status: Home Health | Payer: Medicare Other | Attending: Internal Medicine | Admitting: Internal Medicine

## 2010-06-19 DIAGNOSIS — Z7901 Long term (current) use of anticoagulants: Secondary | ICD-10-CM | POA: Insufficient documentation

## 2010-06-19 DIAGNOSIS — F17200 Nicotine dependence, unspecified, uncomplicated: Secondary | ICD-10-CM | POA: Insufficient documentation

## 2010-06-19 DIAGNOSIS — L97909 Non-pressure chronic ulcer of unspecified part of unspecified lower leg with unspecified severity: Secondary | ICD-10-CM | POA: Insufficient documentation

## 2010-06-19 DIAGNOSIS — I872 Venous insufficiency (chronic) (peripheral): Secondary | ICD-10-CM | POA: Insufficient documentation

## 2010-06-19 DIAGNOSIS — Y831 Surgical operation with implant of artificial internal device as the cause of abnormal reaction of the patient, or of later complication, without mention of misadventure at the time of the procedure: Secondary | ICD-10-CM | POA: Insufficient documentation

## 2010-06-19 DIAGNOSIS — C61 Malignant neoplasm of prostate: Secondary | ICD-10-CM | POA: Insufficient documentation

## 2010-06-19 DIAGNOSIS — I2510 Atherosclerotic heart disease of native coronary artery without angina pectoris: Secondary | ICD-10-CM | POA: Insufficient documentation

## 2010-06-19 DIAGNOSIS — I4891 Unspecified atrial fibrillation: Secondary | ICD-10-CM | POA: Insufficient documentation

## 2010-06-19 DIAGNOSIS — E1169 Type 2 diabetes mellitus with other specified complication: Principal | ICD-10-CM | POA: Insufficient documentation

## 2010-06-19 DIAGNOSIS — I739 Peripheral vascular disease, unspecified: Secondary | ICD-10-CM | POA: Insufficient documentation

## 2010-06-19 DIAGNOSIS — E669 Obesity, unspecified: Secondary | ICD-10-CM | POA: Insufficient documentation

## 2010-06-19 DIAGNOSIS — Z96659 Presence of unspecified artificial knee joint: Secondary | ICD-10-CM | POA: Insufficient documentation

## 2010-06-19 DIAGNOSIS — I10 Essential (primary) hypertension: Secondary | ICD-10-CM | POA: Insufficient documentation

## 2010-06-19 DIAGNOSIS — Z79899 Other long term (current) drug therapy: Secondary | ICD-10-CM | POA: Insufficient documentation

## 2010-06-19 DIAGNOSIS — T8450XA Infection and inflammatory reaction due to unspecified internal joint prosthesis, initial encounter: Secondary | ICD-10-CM | POA: Insufficient documentation

## 2010-06-19 LAB — DIFFERENTIAL
Basophils Absolute: 0 10*3/uL (ref 0.0–0.1)
Basophils Relative: 0 % (ref 0–1)
Monocytes Absolute: 1.2 10*3/uL — ABNORMAL HIGH (ref 0.1–1.0)
Neutro Abs: 7.1 10*3/uL (ref 1.7–7.7)
Neutrophils Relative %: 72 % (ref 43–77)

## 2010-06-19 LAB — CBC
Hemoglobin: 11.5 g/dL — ABNORMAL LOW (ref 13.0–17.0)
MCHC: 32.3 g/dL (ref 30.0–36.0)
Platelets: 189 10*3/uL (ref 150–400)
RDW: 17 % — ABNORMAL HIGH (ref 11.5–15.5)

## 2010-06-19 LAB — URINALYSIS, ROUTINE W REFLEX MICROSCOPIC
Ketones, ur: NEGATIVE mg/dL
Nitrite: NEGATIVE
Protein, ur: NEGATIVE mg/dL
Urobilinogen, UA: 1 mg/dL (ref 0.0–1.0)
pH: 7.5 (ref 5.0–8.0)

## 2010-06-19 LAB — COMPREHENSIVE METABOLIC PANEL
ALT: 17 U/L (ref 0–53)
AST: 28 U/L (ref 0–37)
Albumin: 3.2 g/dL — ABNORMAL LOW (ref 3.5–5.2)
CO2: 35 mEq/L — ABNORMAL HIGH (ref 19–32)
Calcium: 9.1 mg/dL (ref 8.4–10.5)
GFR calc Af Amer: >60 mL/min (ref >60)
Sodium: 137 mEq/L (ref 135–145)
Total Protein: 7.4 g/dL (ref 6.0–8.3)

## 2010-06-19 LAB — GLUCOSE, CAPILLARY
Glucose-Capillary: 71 mg/dL (ref 70–99)
Glucose-Capillary: 50 mg/dL — ABNORMAL LOW (ref 70–99)
Glucose-Capillary: 92 mg/dL (ref 70–99)
Glucose-Capillary: 62 mg/dL — ABNORMAL LOW (ref 70–99)

## 2010-06-20 LAB — GLUCOSE, CAPILLARY
Glucose-Capillary: 115 mg/dL — ABNORMAL HIGH (ref 70–99)
Glucose-Capillary: 158 mg/dL — ABNORMAL HIGH (ref 70–99)
Glucose-Capillary: 167 mg/dL — ABNORMAL HIGH (ref 70–99)
Glucose-Capillary: 168 mg/dL — ABNORMAL HIGH (ref 70–99)
Glucose-Capillary: 169 mg/dL — ABNORMAL HIGH (ref 70–99)
Glucose-Capillary: 170 mg/dL — ABNORMAL HIGH (ref 70–99)
Glucose-Capillary: 358 mg/dL — ABNORMAL HIGH (ref 70–99)

## 2010-06-20 LAB — BASIC METABOLIC PANEL
Chloride: 94 mEq/L — ABNORMAL LOW (ref 96–112)
Creatinine, Ser: 0.86 mg/dL (ref 0.4–1.5)
GFR calc Af Amer: >60 mL/min (ref >60)
GFR calc non Af Amer: >60 mL/min (ref >60)
Potassium: 4.2 mEq/L (ref 3.5–5.1)

## 2010-06-20 LAB — TSH: TSH: 0.696 u[IU]/mL (ref 0.350–4.500)

## 2010-06-20 LAB — CBC
HCT: 30.7 % — ABNORMAL LOW (ref 39.0–52.0)
Hemoglobin: 9.9 g/dL — ABNORMAL LOW (ref 13.0–17.0)
WBC: 8.2 10*3/uL (ref 4.0–10.5)

## 2010-06-20 LAB — T4, FREE: Free T4: 1 ng/dL (ref 0.80–1.80)

## 2010-06-20 LAB — PROTIME-INR
INR: 2.27 — ABNORMAL HIGH (ref 0.00–1.49)
Prothrombin Time: 25.2 seconds — ABNORMAL HIGH (ref 11.6–15.2)

## 2010-06-20 LAB — MAGNESIUM: Magnesium: 1.5 mg/dL (ref 1.5–2.5)

## 2010-06-21 LAB — CBC
HCT: 30.9 % — ABNORMAL LOW (ref 39.0–52.0)
Hemoglobin: 9.9 g/dL — ABNORMAL LOW (ref 13.0–17.0)
MCH: 26 pg (ref 26.0–34.0)
MCHC: 32 g/dL (ref 30.0–36.0)
MCV: 81.1 fL (ref 78.0–100.0)
RBC: 3.81 MIL/uL — ABNORMAL LOW (ref 4.22–5.81)

## 2010-06-21 LAB — GLUCOSE, CAPILLARY
Glucose-Capillary: 136 mg/dL — ABNORMAL HIGH (ref 70–99)
Glucose-Capillary: 132 mg/dL — ABNORMAL HIGH (ref 70–99)
Glucose-Capillary: 208 mg/dL — ABNORMAL HIGH (ref 70–99)

## 2010-06-21 LAB — BASIC METABOLIC PANEL
BUN: 23 mg/dL (ref 6–23)
CO2: 29 mEq/L (ref 19–32)
Chloride: 95 mEq/L — ABNORMAL LOW (ref 96–112)
GFR calc non Af Amer: >60 mL/min (ref >60)
Glucose, Bld: 128 mg/dL — ABNORMAL HIGH (ref 70–99)
Potassium: 4.3 mEq/L (ref 3.5–5.1)
Sodium: 133 mEq/L — ABNORMAL LOW (ref 135–145)

## 2010-06-21 LAB — PROTIME-INR: Prothrombin Time: 22.3 seconds — ABNORMAL HIGH (ref 11.6–15.2)

## 2010-06-23 NOTE — H&P (Signed)
NAME:  Derek Frye, SIGL NO.:  [id number]  MEDICAL RECORD NO.:  [id number]           PATIENT TYPE:  E  LOCATION:  WLED                         FACILITY:  [facility]  PHYSICIAN:  Vania Rea, M.D. DATE OF BIRTH:  1941/01/22  DATE OF ADMISSION:  06/19/2010 DATE OF DISCHARGE:                             HISTORY & PHYSICAL   PRIMARY CARE PHYSICIAN:  Henri Medal, MD at [facility].  ORTHOPEDIC SURGEON:  Dr. Durene Romans.  UROLOGIST:  Maretta Bees. Vonita Moss, MD  CARDIOLOGIST:  Dr. Willa Rough  CHIEF COMPLAINT:  Low blood sugars.  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  The patient also suffered with obesity, but has been progressively losing weight.  He reports partly related to his chronic illnesses and his antidiabetic medications have been progressively reduced.  He is now reduced to taking only metformin 850 mg twice per day.  He used to be taking Amaryl as well as a much higher dose of metformin.  The patient's wife checks his blood sugars daily.  They usually around in the 120s, was 123 yesterday morning, but the patient woke at about 4 a.m. this morning, sweaty and confused, having had in a dream.  His wife was alert, symptoms of hypoglycemia, checked his blood sugar and it was in the 40s.  She gave him some glucose, which improved his condition, then she drove him to the [facility] for further evaluation and treatment.  In the emergency room, his blood sugars were again found to be in the 40s. He received intravenous dextrose but has been unable to sustain  a persistently normal blood sugar and the hospitalist service was called to assist with management.  The patient denies having taken any Amaryl since January.  He did eat his normal supper last night.  He in fact did not take his evening dose of metformin yesterday.  He denies any fever, cough, or cold.  He denies any chest pains or shortness of breath.  His wife does note that since he started the Levaquin, he has been having increasing bruising of his skin which she has read as due to an interaction of the Levaquin with his Coumadin and she is wondering if the Levaquin is also precipitating his hypoglycemia.  PAST MEDICAL HISTORY: 1. Coronary artery disease, status post CABG in 1985 and redo in 1994. 2. Hypertension. 3. Diabetes type 2. 4. Atrial fibrillation. 5. Obesity. 6. Peripheral vascular disease. 7. Chronic venous stasis ulcers. 8. 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 9. Status post left knee pacer placement. 10.Status post wound VAC placement. 11.Chronic bilateral venous stasis. 12.Status post fem-pop artery bypass. 13.Status post prostate cancer diagnosis in 2005, treated with     external beam radiation and then implantation of seeds.  Further  surgery was interrupted because of his problems with the infection     in his knee.  MEDICATIONS: 1. Lasix 40 mg daily. 2. Spironolactone 25 mg twice daily. 3. Metolazone 2.5 mg each morning. 4. Potassium chloride 20 mEq 2 tablets daily. 5. Zocor 40 mg at bedtime. 6. Iron tablets 1 daily. 7. Colace 100-200 mg daily as needed. 8. Flexeril 5-10 mg 3 times daily as needed. 9. Coumadin 2.5 mg tablets, takes 1 tablet Tuesday, Thursday,     Saturday, Sunday, and 1/2 tablet Monday, Wednesday, Friday. 10.Clonazepam 0.5 to 1 mg twice daily as needed. 11.Metformin 850 mg twice daily. 12.Levaquin 750 mg daily. 13.Doxycycline 100 mg twice daily. 14.Aspirin 81 mg daily. 15.Ramipril 5 mg  daily. 16.Carnation instant breakfast high-protein drink 1 can twice daily.  ALLERGIES:  AMOXICILLIN, which causes swelling of his body, and MORPHINE, which causes nausea and vomiting.  SOCIAL HISTORY:  He is a retired [employer].  He smoked half a pack of cigarette per day for many years.  Used to be an occasional drinker of alcohol but has not taken alcohol for many years.  His code status is a full code.  FAMILY HISTORY:  Significant for mother who lived to be age 61 and was in good health throughout her life, father who died at the age 60 after 4 years of vascular disease.  He had a heart attack and a stroke 4 years earlier.  Brother with hypertension, coronary artery disease, diabetes. Sister with diabetes.  He has one 7 year old daughter in good health.  REVIEW OF SYSTEMS:  Other than noted above significant only for gait abnormality related to his lower extremity issues.  PHYSICAL EXAM:  GENERAL:  A very pleasant elderly Caucasian gentleman, sitting up in the stretcher. VITAL SIGNS:  His temperature is 98.2, pulse 100, respiration 20, blood pressure 120/58.  He is saturating at 99% on room air. HEENT:  His pupils are round and equal.  Mucous membranes pink. Anicteric.  No cervical lymphadenopathy or thyromegaly.  No carotid bruit. CHEST:  Clear to auscultation bilaterally. CARDIOVASCULAR:  Regular rhythm.  No murmur heard. ABDOMEN:  Obese, soft, nontender.  No masses are felt. EXTREMITIES:  He has 1+ edema bilaterally on the skin of both lower extremities.  Is purplish and discolored and has chronic venous stasis changes.  He has a wound VAC inserted into the upper aspect of the left tibia.  He has markedly deformed knees, left more so than the right. CENTRAL NERVOUS SYSTEM:  Within the limits of his orthopedic disability. Cranial nerves II-XII are grossly intact.  He has no focal lateralizing signs.  LABORATORY DATA:  His white count is 9.8, hemoglobin 11.5, MCV  81.7, platelets 189,000.  He has normal differential.  His sodium is 137, potassium 3.6, chloride 93, CO2 35, his glucose was 48 on his initial blood test.  His subsequent CBG showed a blood sugar of 108 and then 57. His BUN is 21.  His creatinine is 0.87.  His albumin is 3.2, calcium 9.1.  His liver functions are completely normal.  His INR is therapeutic at 2.26.  Urinalysis shows clear urine.  Urine glucose 100, but otherwise urinalysis is unremarkable.  Negative for proteins, nitrites, or leukocyte esterase.  His chest x-ray was last done in December and did not show any acute infiltrate.  We do not see a recent CT scan of his chest in our system.  ASSESSMENT: 1. Persistent hypoglycemia in a gentleman, on metformin and Levaquin  and who is also a heavy user of tobacco. 2. Plan, we will bring this gentleman on observation for 24 hours.  We     will give him D10 dextrose drip and put him on diabetic diet.  Will     continue his usual medications and would recommend that he be     discharged with advice to attempt to control his diabetes with     dietary measures.  If he requires continued treatment of his     diabetes, would recommend resuming metformin at a lower dose.  If     his hypoglycemia persists, would recommend getting a CT scan of his     chest to rule out pulmonary malignancy since he is at risk with his     many years of tobacco abuse. 3. Other plans as per orders.  Will also check his PSA     Vania Rea, M.D.     LC/MEDQ  D:  06/19/2010  T:  06/19/2010  Job:  [id number]  cc:   Madlyn Frankel Charlann Boxer, M.D. Fax: [phone]  Henri Medal, MD  Maretta Bees. Vonita Moss, M.D. Fax: [phone]  Luis Abed, MD, [facility] [address]  [address] [place] [address]  Electronically Signed by Vania Rea M.D. on 06/23/2010 02:58:39 AM

## 2010-06-26 ENCOUNTER — Ambulatory Visit: Payer: Self-pay | Admitting: Cardiology

## 2010-06-28 ENCOUNTER — Encounter: Payer: Self-pay | Admitting: Cardiology

## 2010-06-28 DIAGNOSIS — Z7901 Long term (current) use of anticoagulants: Secondary | ICD-10-CM | POA: Insufficient documentation

## 2010-06-28 DIAGNOSIS — I4510 Unspecified right bundle-branch block: Secondary | ICD-10-CM | POA: Insufficient documentation

## 2010-06-28 DIAGNOSIS — I10 Essential (primary) hypertension: Secondary | ICD-10-CM | POA: Insufficient documentation

## 2010-06-28 DIAGNOSIS — E8770 Fluid overload, unspecified: Secondary | ICD-10-CM | POA: Insufficient documentation

## 2010-06-28 DIAGNOSIS — I739 Peripheral vascular disease, unspecified: Secondary | ICD-10-CM

## 2010-06-28 DIAGNOSIS — I2510 Atherosclerotic heart disease of native coronary artery without angina pectoris: Secondary | ICD-10-CM | POA: Insufficient documentation

## 2010-06-28 DIAGNOSIS — E669 Obesity, unspecified: Secondary | ICD-10-CM | POA: Insufficient documentation

## 2010-06-28 DIAGNOSIS — F17210 Nicotine dependence, cigarettes, uncomplicated: Secondary | ICD-10-CM | POA: Insufficient documentation

## 2010-06-28 DIAGNOSIS — E785 Hyperlipidemia, unspecified: Secondary | ICD-10-CM | POA: Insufficient documentation

## 2010-06-28 DIAGNOSIS — I4891 Unspecified atrial fibrillation: Secondary | ICD-10-CM | POA: Insufficient documentation

## 2010-06-28 NOTE — Discharge Summary (Signed)
NAME:  Derek Frye, Derek Frye NO.:  [id number]  MEDICAL RECORD NO.:  [id number]           PATIENT TYPE:  O  LOCATION:  1311                         FACILITY:  [facility]  PHYSICIAN:  Erick Blinks, MD     DATE OF BIRTH:  01-25-1941  DATE OF ADMISSION:  06/19/2010 DATE OF DISCHARGE:                        DISCHARGE SUMMARY - REFERRING   PRIMARY CARE PHYSICIAN:  Dr. Ludwig Clarks at [facility].  ORTHOPEDIC SURGEON:  Madlyn Frankel. Charlann Boxer, M.D.  DISCHARGE DIAGNOSES: 1. Persistent hypoglycemia, resolved. 2. Type 2 diabetes. 3. Chronic atrial fibrillation, on Coumadin. 4. 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 5. Status post left knee spacer placement. 6. Chronic bilateral venous stasis with chronic lower extremity wound. 7. Peripheral vascular disease. 8. Hypertension. 9. Obesity. 10.Status post prostate cancer, diagnosis in 2005.  DISCHARGE MEDICATIONS: 1. Altace 5 mg p.o. daily. 2. Zocor 40 mg p.o. q.h.s. 3. Metformin 500 mg p.o. daily. 4. Aspirin enteric-coated 81 mg p.o. daily. 5. Clonazepam 1 mg 1/2 tablet p.o. b.i.d. p.r.n. 6. Lasix 40 mg p.o. daily. 7. Cyclobenzaprine 5 mg 1 tablet p.o. t.i.d. p.r.n. 8. Spironolactone 25 mg p.o. b.i.d. 9. Metolazone 2.5 mg p.o. daily. 10.Docusate 100 mg p.o. daily. 11.Carnation Instant Breakfast high protein drink 1 can p.o. b.i.d. 12.Ferrous sulfate 325 mg p.o. daily. 13.Doxycycline 100 mg p.o. b.i.d. 14.Levaquin 750 mg p.o. daily. 15.Oxycodone 5 mg p.o. q.6 h. p.r.n. 16.Potassium chloride 40 mEq p.o. daily. 17.Coumadin 2.5 mg 1/2 tablet p.o. on Monday, Wednesday, and Friday     and 1 tablet on Tuesday, Thursday, Saturday, and Sunday.  ADMISSION HISTORY:  This is a 70 year old gentleman with multiple medical problems who was brought to the emergency room with hypoglycemia.  The patient had been on Amaryl and metformin in the past, but due to progressive weight loss for  multiple medical issues in the past year his Amaryl was subsequently discontinued and his metformin was also decreased in dosage.  He reports that he had adequate p.o. intake and his sugars usually range in the 120s range.  He woke up at 4 a.m. in the morning of admission and was confused and sweaty.  His blood sugar was found to be in the 40s.  He was brought to the emergency room where although his hyperglycemia did correct with IV dextrose he continued to go back in the hypoglycemia.  The patient was subsequently referred for admission.  For further details, please refer to the history and physical dictated by Dr. Orvan Falconer on April 10th.  HOSPITAL COURSE: 1. Hypoglycemia.  The patient was initially placed on D10 infusion.     This was discontinued when his blood sugars ranged over 150.  He     was monitored with q.2 h. CBGs.  His blood sugars have been ranging     in the 120s to 150s range.  His A1c was checked and was found to be     5.5.  It was recommended that the patient be on lower dose of     metformin.  There was no clear focus of infection, which could have  been relating to his hypoglycemia.  The patient will be continued     on metformin 500 mg once a day, but his family wishes to follow his     blood sugars at home first before resuming this.  It was     recommended that if his blood sugars range above 150 to restart the     metformin. 2. Chronic venous stasis ulcers.  The patient has been followed by     Wound Care at home as well as Dr. Charlann Boxer.  He had a wound VAC in     place.  He was seen by in consultation by the Wound Care nurse here     in the hospital and it was recommended that the wound VAC be     discontinued.  This was discontinued here in the hospital and the     patient will follow up with Dr. Charlann Boxer for further wound check and     further therapy as needed.  We will continue with home health RN to     assist with dressing changes.  He is continued on  doxycycline and     Levaquin per Dr. Charlann Boxer. 3. Atrial fibrillation.  This is rate controlled and the patient is     anticoagulated with Coumadin. 4. The remainder of the patient's chronic medical issues have remained     stable.  CONSULTATIONS:  None.  PROCEDURES:  None.  DIAGNOSTIC IMAGING:  None.  DISCHARGE INSTRUCTIONS:  The patient should continue on a heart-healthy low-calorie diet, conduct his activity as tolerated.  He will continue with home care services to assist with dressing changes.  He will need to see Dr. Charlann Boxer within the next 1 week for wound check as well as his primary care physician in the next 1-2 weeks to help manage his diabetes.  Activity should be conducted as tolerated.  Plan was discussed with the patient as well as his wife who are in agreement.  TIME SPENT ON THIS DISCHARGE:  40 minutes.  CONDITION ON DISCHARGE:  Improved.     Erick Blinks, MD     JM/MEDQ  D:  06/21/2010  T:  06/21/2010  Job:  [id number]  cc:   [facility]  Madlyn Frankel. Charlann Boxer, M.D. Fax: [phone]  Electronically Signed by Erick Blinks  on 06/28/2010 12:37:36 PM

## 2010-06-29 ENCOUNTER — Ambulatory Visit (INDEPENDENT_AMBULATORY_CARE_PROVIDER_SITE_OTHER): Payer: Medicare Other | Admitting: Cardiology

## 2010-06-29 ENCOUNTER — Encounter: Payer: Self-pay | Admitting: Cardiology

## 2010-06-29 VITALS — BP 90/53 | HR 78 | Resp 18 | Ht 71.0 in | Wt 206.0 lb

## 2010-06-29 DIAGNOSIS — I779 Disorder of arteries and arterioles, unspecified: Secondary | ICD-10-CM

## 2010-06-29 DIAGNOSIS — E8770 Fluid overload, unspecified: Secondary | ICD-10-CM

## 2010-06-29 DIAGNOSIS — I4891 Unspecified atrial fibrillation: Secondary | ICD-10-CM

## 2010-06-29 DIAGNOSIS — IMO0002 Reserved for concepts with insufficient information to code with codable children: Secondary | ICD-10-CM

## 2010-06-29 DIAGNOSIS — Z7901 Long term (current) use of anticoagulants: Secondary | ICD-10-CM

## 2010-06-29 DIAGNOSIS — E8779 Other fluid overload: Secondary | ICD-10-CM

## 2010-06-29 DIAGNOSIS — R9430 Abnormal result of cardiovascular function study, unspecified: Secondary | ICD-10-CM

## 2010-06-29 DIAGNOSIS — I10 Essential (primary) hypertension: Secondary | ICD-10-CM

## 2010-06-29 DIAGNOSIS — F17210 Nicotine dependence, cigarettes, uncomplicated: Secondary | ICD-10-CM

## 2010-06-29 DIAGNOSIS — F17200 Nicotine dependence, unspecified, uncomplicated: Secondary | ICD-10-CM

## 2010-06-29 MED ORDER — RAMIPRIL 2.5 MG PO CAPS: 2.5000 mg | ORAL_CAPSULE | Freq: Every day | ORAL | Status: DC

## 2010-06-29 NOTE — Assessment & Plan Note (Signed)
Patient has known carotid artery disease.  He had 40-50% LICA in 2009.  We will arrange for followup unless there has been another daughter that I'm not aware of.

## 2010-06-29 NOTE — Patient Instructions (Signed)
Your physician recommends that you schedule a follow-up appointment in: 6 months with Dr. Myrtis Ser. The office will mail you a reminder letter 2 months prior appointment date.

## 2010-06-29 NOTE — Assessment & Plan Note (Signed)
Historically his blood pressures been high.  However more recently with his weight loss it has been lower side.  He is in a wheelchair and not having significant symptoms.  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

## 2010-06-29 NOTE — Assessment & Plan Note (Signed)
Historically his ejection fraction has been mildly reduced.  I believe he has had an echo more recently and we will look for the results.

## 2010-06-29 NOTE — Assessment & Plan Note (Signed)
Atrophic relation rate is controlled.  No change in therapy.

## 2010-06-29 NOTE — Assessment & Plan Note (Signed)
Discharge status is stable.  He has edema but this is chronic.  No change in therapy.

## 2010-06-29 NOTE — Assessment & Plan Note (Signed)
He has now stopped smoking.Marland Kitchen

## 2010-06-29 NOTE — Progress Notes (Signed)
HPI Patient is seen for followup of coronary disease.  From the cardiac viewpoint he is actually doing well.  He's had very significant problems after his knee surgery.  He had multiple recurrent operations and ultimately only had a spacer put in his leg.  He says he notes his walk.  He has not had chest pain or shortness of breath. Allergies  Allergen Reactions  . Amoxicillin     REACTION: swelling and a rash  . Morphine     Current Outpatient Prescriptions  Medication Sig Dispense Refill  . aspirin 81 MG tablet Take 81 mg by mouth daily.        . clonazePAM (KLONOPIN) 1 MG tablet Take 1 mg by mouth as needed.        . cyclobenzaprine (FLEXERIL) 5 MG tablet Take 5 mg by mouth 3 (three) times daily as needed.        . docusate sodium (COLACE) 100 MG capsule Take 100 mg by mouth 2 (two) times daily as needed.        . doxycycline (VIBRA-TABS) 100 MG tablet Take 1 tablet (100 mg total) by mouth 2 (two) times daily.  180 tablet  4  . ferrous sulfate 325 (65 FE) MG tablet Take 325 mg by mouth daily with breakfast.        . furosemide (LASIX) 40 MG tablet Take one (1) tablet by mouth daily.       Marland Kitchen levofloxacin (LEVAQUIN) 750 MG tablet Take 1 tablet (750 mg total) by mouth daily.  90 tablet  0  . metolazone (ZAROXOLYN) 2.5 MG tablet Take 2.5 mg by mouth daily.        . Nutritional Supplements (CARNATION INSTANT BREAKFAST) LIQD Take 1 Can by mouth 2 (two) times daily.        Marland Kitchen oxycodone (OXY-IR) 5 MG capsule Take 5 mg by mouth every 4 (four) hours as needed.        . potassium chloride SA (K-DUR,KLOR-CON) 20 MEQ tablet Take 20 mEq by mouth 2 (two) times daily.        . ramipril (ALTACE) 5 MG capsule Take 5 mg by mouth daily.        . simvastatin (ZOCOR) 40 MG tablet Take 40 mg by mouth at bedtime.        Marland Kitchen spironolactone (ALDACTONE) 25 MG tablet Take one (1) tablet by mouth two (2) times a day.       . warfarin (COUMADIN) 2.5 MG tablet Take 1 tablet (2.5 mg total) by mouth as directed. Take as  directed by [facility].  90 tablet  0  . CefTRIAXone Sodium (ROCEPHIN IV) Once daily       . metFORMIN (GLUCOPHAGE) 500 MG tablet Take one (1) tablet by mouth two (2) times a day.       Marland Kitchen DISCONTD: HYDROcodone-acetaminophen (NORCO) 5-325 MG per tablet Take one (1) tablet by mouth up to three (3) times a day.         History   Social History  . Marital Status: Married    Spouse Name: N/A    Number of Children: N/A  . Years of Education: N/A   Occupational History  . Not on file.   Social History Main Topics  . Smoking status: Current Some Day Smoker    Types: Cigars  . Smokeless tobacco: Not on file  . Alcohol Use: Not on file  . Drug Use: Not on file  . Sexually Active: Not on file  Other Topics Concern  . Not on file   Social History Narrative  . No narrative on file    No family history on file.  Past Medical History  Diagnosis Date  . HTN (hypertension)   . Obesity   . History of cholecystectomy   . CAD (coronary artery disease)     Status post CABG followed by redo in 1994 / nuclear 2007, old infarct, no ischemia  . Ejection fraction < 50%     of 45%...echo 2004  . Hyperlipidemia   . Cigarette smoker     He is now smoking cigars and needs to quit.  . 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 no replacement until leg is stable  . Prostate cancer     History of prostate cancer with a seed implant and this is stable.  . Abdominal aortic aneurysm     is stable, being followed  . Atrial fibrillation     Rate control / going in and out of atrial fib, Coumadin  . Volume overload   . Right bundle branch block     noted November 09, 2008.  . Warfarin anticoagulation     Atrial fibrillation  . Carotid artery disease     Doppler September 2 009, 40-50% LICA    Past Surgical History  Procedure Date  . Coronary artery bypass graft 1994    ROS  Patient denies fever, chills, headache, sweats, change in vision, change in hearing,  chest pain, cough, nausea vomiting, urinary symptoms.  All other systems are reviewed and are negative.  PHYSICAL EXAM Patient is in wheelchair as he cannot walk because of his knee.  He is also regular weight but this is actually good for him.  He is here with his wife today.  He is oriented to person time and place.  Affect is normal.  There is no xanthelasma.  There is no jugular venous distention.  Lungs are clear.  There is no respiratory distress.  Cardiac exam reveals S1-S2.  No click or significant murmur.  The abdomen is soft.  Patient has venous discoloration of his legs.  He has chronic edema but it is not as bad as usual. Filed Vitals:   06/29/10 1441  BP: 90/53  Pulse: 78  Resp: 18  Height: 5\' 11"  (1.803 m)  Weight: 206 lb (93.441 kg)    EKG EKG is done today and reviewed by me.  He has atrial fib.  There is right bundle branch block.  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 The right bundle branch block and inferior MI are old.  There is no significant change.  ASSESSMENT & PLAN

## 2010-06-29 NOTE — Assessment & Plan Note (Signed)
Patient continues on Coumadin for his atrial fib. 

## 2010-07-03 ENCOUNTER — Encounter: Payer: Self-pay | Admitting: Cardiology

## 2010-07-03 ENCOUNTER — Ambulatory Visit (INDEPENDENT_AMBULATORY_CARE_PROVIDER_SITE_OTHER): Payer: Self-pay | Admitting: Cardiology

## 2010-07-03 DIAGNOSIS — R0989 Other specified symptoms and signs involving the circulatory and respiratory systems: Secondary | ICD-10-CM

## 2010-07-03 LAB — PROTIME-INR

## 2010-07-09 ENCOUNTER — Encounter (INDEPENDENT_AMBULATORY_CARE_PROVIDER_SITE_OTHER): Payer: Medicare Other

## 2010-07-09 ENCOUNTER — Ambulatory Visit: Payer: Self-pay | Admitting: Surgery

## 2010-07-09 ENCOUNTER — Other Ambulatory Visit: Payer: Self-pay | Admitting: Surgery

## 2010-07-09 DIAGNOSIS — I714 Abdominal aortic aneurysm, without rupture, unspecified: Secondary | ICD-10-CM

## 2010-07-10 ENCOUNTER — Ambulatory Visit (INDEPENDENT_AMBULATORY_CARE_PROVIDER_SITE_OTHER): Payer: Self-pay | Admitting: Cardiovascular Disease

## 2010-07-10 DIAGNOSIS — R0989 Other specified symptoms and signs involving the circulatory and respiratory systems: Secondary | ICD-10-CM

## 2010-07-11 ENCOUNTER — Encounter: Payer: Self-pay | Admitting: Infectious Disease

## 2010-07-11 ENCOUNTER — Ambulatory Visit (INDEPENDENT_AMBULATORY_CARE_PROVIDER_SITE_OTHER): Payer: Medicare Other | Admitting: Infectious Disease

## 2010-07-11 VITALS — BP 110/66 | HR 89 | Temp 98.2°F

## 2010-07-11 DIAGNOSIS — L02419 Cutaneous abscess of limb, unspecified: Secondary | ICD-10-CM

## 2010-07-11 DIAGNOSIS — L03119 Cellulitis of unspecified part of limb: Secondary | ICD-10-CM

## 2010-07-11 DIAGNOSIS — M009 Pyogenic arthritis, unspecified: Secondary | ICD-10-CM

## 2010-07-11 NOTE — Progress Notes (Signed)
Subjective:    Patient ID: Derek Frye, male    DOB: [birth year], 70 y.o.   MRN: [id number]  HPI 70  yo man who had prior infection of left TKA in 2005 apparently managed by Dr. Roxan Hockey at that time. According to the wife and the pt had infection due to a cat scratch. 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 Roxan Hockey or of isolation of Pasteurella from culture. IN any case he had recurrent infection in his left knee this July 2011after he had bout of cellulitis that originated with ulcers in lower extremity. He was treated by the hospitalist team with broad spectrum antibiotics. Ultimately he underwent I and D with resection arthroplasty and placement of antibiotic spacer on 09/28/09. Intraoperative cultures grew Diphtheroids, though one culture was initially read as CNS and the other as having GNR.He was placed on IV Vancomycin and treated for nearly 12 weeks with IV vancomycin and seen in [place] by Dr. Charlann Boxer who aspirated knee in September and encountered wbc of 1800 on aspiraate without any organisms growing. I saw him this fall and put him on doxycyline. I did a swab culture of surface exudate from on of his wound which grew proteus but I considered it a skin contaminant. In the interim hedeveloped recurrenc of infection in his knee and uderwent yet again I and D and removal of antibiotic spacer by Dr Charlann Boxer. Cultures now grew a pure GBS. He was changed to rocephin whichhe is currently one and he claims to be doing better. He was ultimately transitioned to doxycycline and levofloxacin which he remains on. His wound VAC was removed and he continued to have dressing changes daily with home health. The lesion underneath his knee continues to improve compared to the past. He does have chronic venous stasis changes in his legs he continues to have altered his lower extremities including his heel on the dorsum of his foot which continued to drain material and which  are dressed daily as well. He denies fevers chills or other symptoms to suggest spread of infection beyond the local sites. Again we discussed that the fact that he is not going to clear this without an amputation but he wants to avoid that and continue suppressive antibiotics for the interim period  Review of Systems As in history of present illness otherwise 12 point review of systems is negative.    Objective:   Physical Exam    patient is fatigued appearing he is alert and oriented to person place location. H&T normocephalic and traumatic pupils equal round reactive to light sclerae anicteric oropharynx clear without thrush or exudate. Neck supple cardiac exam regular rate and rhythm no murmurs or rubs heard lungs clear bilaterally abdomen soft nondistended nontender positive bowel sounds. Extremities with 2+ pretibial edema bilaterally. Skin he has chronic venous stasis changes bilaterally his and his ankles. And lower shins. Examination of his left knee where he had a total knee arthroplasty was replaced reveals an area distal to the knee that is open and still continues to have some exudative lesion and material on the bandage. The wound bed itself actually has healthy granulation tissue visible. Examination of his foot reveals an ulcer on the dorsum of the foot that is granulating but still with exudate as well heel has a large ulcer to which continues to have material on it as well. There is no frank purulence and no frank severe erythema or fluctuance exact seen on examination.  His neurological exam is nonfocal his strength is intact as is sensation. Psychiatric patient is pleasant and engaged his daughters relatively clea goal-directed and linear.    Assessment & Plan:  PYOGENIC ARTHRITIS, LOWER LEG Continue as attempts at suppressive therapy with levofloxacin and doxycycline. I'll recheck a sedimentation rate and C-reactive protein. Again he will likely need above-the-knee indication to put  an end to this recurrent problem. However he wants to forestall that for the time being.    PYOGENIC ARTHRITIS, LOWER LEG Continue as attempts at suppressive therapy with levofloxacin and doxycycline. I'll recheck a sedimentation rate and C-reactive protein. Again he will likely need above-the-knee indication to put an end to this recurrent problem. However he wants to forestall that for the time being.  CELLULITIS AND ABSCESS OF LEG EXCEPT FOOT Chronic ulcers and open wounds but not risk of recurrent infection of his prosthesis. As described above he probably needs an above-the-knee of dictation. He is going to need to continue to have ongoing vigilance wound care

## 2010-07-11 NOTE — Assessment & Plan Note (Signed)
Chronic ulcers and open wounds but not risk of recurrent infection of his prosthesis. As described above he probably needs an above-the-knee of dictation. He is going to need to continue to have ongoing vigilance wound care

## 2010-07-11 NOTE — Assessment & Plan Note (Signed)
Continue as attempts at suppressive therapy with levofloxacin and doxycycline. I'll recheck a sedimentation rate and C-reactive protein. Again he will likely need above-the-knee indication to put an end to this recurrent problem. However he wants to forestall that for the time being.

## 2010-07-13 ENCOUNTER — Encounter: Payer: Self-pay | Admitting: Infectious Disease

## 2010-07-17 NOTE — Procedures (Unsigned)
DUPLEX ULTRASOUND OF ABDOMINAL AORTA  INDICATION:  Followup of abdominal aortic aneurysm.  HISTORY: Diabetes:  Yes. Cardiac:  CABG x2. Hypertension:  Yes. Smoking:  Yes. Connective Tissue Disorder: Family History:  No. Previous Surgery:  No.  DUPLEX EXAM:         AP (cm)                   TRANSVERSE (cm) Proximal             2.55 cm                   2.78 cm Mid                  2.13 cm                   2.59 cm Distal               4.70 cm                   4.65 cm Right Iliac          2.19 cm                   2.57 cm Left Iliac           2.14 cm                   2.19 cm  PREVIOUS:  Date:  02/22/2009  AP:  4.39  TRANSVERSE:  5.07  IMPRESSION: 1. Abdominal aortic aneurysm present measuring approximately 4.70 cm x     4.65 cm without intramural thrombus present. 2. 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 3. Left common iliac artery aneurysm measuring 2.14 cm x 2.19 cm. 4. Abdominal aortic aneurysm appears stable in size since previous     study on 02/22/2009. 5. The bilateral iliac artery aneurysms have minimally increased since     previous study on 02/22/2009.  ___________________________________________ V. Charlena Cross, MD  SH/MEDQ  D:  07/09/2010  T:  07/09/2010  Job:  [id number]

## 2010-07-19 ENCOUNTER — Encounter: Payer: Self-pay | Admitting: Cardiology

## 2010-07-19 ENCOUNTER — Ambulatory Visit (INDEPENDENT_AMBULATORY_CARE_PROVIDER_SITE_OTHER): Payer: Self-pay | Admitting: Cardiovascular Disease

## 2010-07-19 DIAGNOSIS — R0989 Other specified symptoms and signs involving the circulatory and respiratory systems: Secondary | ICD-10-CM

## 2010-07-19 LAB — PROTIME-INR: INR: 1.8 — AB (ref <=1.1)

## 2010-07-23 ENCOUNTER — Encounter: Payer: Self-pay | Admitting: Infectious Disease

## 2010-07-23 ENCOUNTER — Ambulatory Visit: Payer: Self-pay | Admitting: Surgery

## 2010-07-24 NOTE — Assessment & Plan Note (Signed)
[facility]                            CARDIOLOGY OFFICE NOTE   SOCORRO, EBRON                     MRN:          [id number]  DATE:04/26/2008                            DOB:          07-Sep-1940    Derek Frye is here for followup.  I had seen him on January 29, 2008.  He has seen Dr. Benedetto Goad since that time.  Derek Frye has had follow-  up Doppler with Dr. Madilyn Fireman' office.  His abdominal aortic aneurysm  measurements are stable and can be followed.  Derek Frye tells me that  his potassium was low.  Dr. Andrey Campanile adjusted his medication.  In  particular, the patient's metolazone was stopped because of the  significant potassium loss.  Potassium was added.  In the meantime, Mr.  Frye has gained some fluid weight and, on his own, he restarted  metolazone 2.5 mg every other day and he remained on a lower dose of  Lasix 20 mg daily.  Today he does have peripheral edema.   PAST MEDICAL HISTORY:   ALLERGIES:  MORPHINE.   MEDICATIONS:  Altace 5, Amaryl 4, metformin 500 b.i.d., Coumadin,  Lipitor 20, Lasix 20, KCl 40 and metolazone 2.5 every other day.   Other medical problems, see the complete list on my note of November 24, 2007.   REVIEW OF SYSTEMS:  He is not having any GI or GU symptoms.  He has no  headaches or eye problems.  He has had a recent upper respiratory  infection.  Otherwise review of systems is negative.   PHYSICAL EXAMINATION:  VITAL SIGNS:  Weight on our scale today is 284  pounds.  He has definitely gained weight.  The blood pressure is 112/56.  Pulse of 64.  HEENT:  Reveals no xanthelasma.  He has normal extraocular motion.  There are no carotid bruits.  There is no jugular venous tension.  LUNGS:  Clear.  Respiratory effort is not labored.  CARDIAC:  Exam reveals an S1 and S2.  There are no clicks or significant  murmurs.  ABDOMEN:  The abdomen is protuberant.   PROBLEMS:  Include:  #1.  Hypertension.  #2.   Obesity.  #3.  Status post cholecystectomy.  #4.  Coronary disease post coronary artery bypass graft with a redo in  1994.  #5.  Ejection fraction 45%.  #6.  Hyperlipidemia.  #7.  Cigarette use.  He quit.  He now smokes cigars.  As always, he is  counseled and encouraged not to smoke at all.  I did speak with him  about this.  #8.  History of knee difficulties and surgery.  #9.  History of prostate cancer.  #10.  Abdominal aortic aneurysm followed by Dr. Liliane Bade.  #11.  Atrial fibrillation.  Rate is controlled.  #12.  Volume overload.  The patient needs to be on diuretics.  He does  lose a lot of potassium with the metolazone.  I am hesitant to make  multiple changes as the patient is following with Dr. Andrey Campanile.  I have  encouraged the patient to see Dr.  Wilson for followup to continue to  work with the dosing of his diuretics and his potassium.     Luis Abed, MD, [facility]  Electronically Signed    JDK/MedQ  DD: 04/26/2008  DT: 04/26/2008  Job #: [id number]

## 2010-07-24 NOTE — Procedures (Signed)
DUPLEX ULTRASOUND OF ABDOMINAL AORTA   INDICATION:  Followup of abdominal aortic aneurysm.   HISTORY:  Diabetes:  Yes.  Cardiac:  CABG x2.  Hypertension:  Yes.  Smoking:  Yes.  Connective Tissue Disorder:  Family History:  No.  Previous Surgery:  No.   DUPLEX EXAM:         AP (cm)                   TRANSVERSE (cm)  Proximal             3.62 cm                   2.62 cm  Mid                  4.39 cm                   5.07 cm  Distal               2.30 cm                   2.03 cm  Right Iliac          2.01 cm                   1.92 cm  Left Iliac           1.93 cm                   1.77 cm   PREVIOUS:  Date:  09/01/2008  AP:  4.38  TRANSVERSE:  4.91   IMPRESSION:  1. Abdominal aortic aneurysm with largest measurement of 4.39 x 5.07      cm.  2. Appears stable to previous study.   ___________________________________________  Janetta Hora Fields, MD   CJ/MEDQ  D:  02/22/2009  T:  02/22/2009  Job:  [id number]

## 2010-07-24 NOTE — Assessment & Plan Note (Signed)
[facility]                            CARDIOLOGY OFFICE NOTE   WILBERN, PENNYPACKER                     MRN:          [id number]  DATE:01/07/2008                            DOB:          15-Dec-1940    Mr. Tabet is doing well.  When I saw him on November 24, 2007, we  decided to proceed with coumadinization for his atrial fib.  This is  being done carefully and successfully and he is not having any problems.  I am seeing him back today in followup of this and to be sure about his  carotid Dopplers.  He has not had any significant symptoms.  He is not  having any chest pain or shortness of breath.   PAST MEDICAL HISTORY:   ALLERGIES:  MORPHINE.   MEDICATIONS:  Altace, Amaryl, Lipitor, Lasix, KCL, metolazone as needed,  metformin, and Coumadin.   OTHER MEDICAL PROBLEMS:  See the list on the prior notes.   REVIEW OF SYSTEMS:  He has no complaints.  He has no GI or GU symptoms.  He has no fevers, chills, or skin rashes.  Otherwise, review of systems  is negative.   PHYSICAL EXAMINATION:  VITAL SIGNS:  Today, blood pressure is 120/75  with a pulse of 64.  GENERAL:  The patient is oriented to person, time, and place.  He is  significantly overweight.  HEENT:  No xanthelasma.  He has normal extraocular motion.  There are no  carotid bruits.  There is no jugular venous tension.  LUNGS:  Clear.  Respiratory effort is nonlabored.  CARDIAC:  S1 and S2.  There are no clicks or significant murmurs.  ABDOMEN:  Soft.  There is no peripheral edema.   His carotid Dopplers were stable.  He does have disease.  He has 0 to  40% on the right and 40 to 60% on the left and this can be followed in 1  year.  His coumadinization is going well.   I will see him in 1 month and then decide if we want to approach the  issue of cardioversion or not.     Luis Abed, MD, [facility]  Electronically Signed    JDK/MedQ  DD: 01/07/2008  DT: 01/08/2008  Job #:  [id number]   cc:   Gloriajean Dell. Andrey Campanile, M.D.

## 2010-07-24 NOTE — Procedures (Signed)
DUPLEX ULTRASOUND OF ABDOMINAL AORTA   INDICATION:  Follow-up evaluation of known abdominal aortic aneurysm.   HISTORY:  Diabetes:  Orally controlled.  Cardiac:  MI in 1984.  Hypertension:  Yes.  Smoking:  Half pack per day.  Connective Tissue Disorder:  Family History:  No.  Previous Surgery:  No.   DUPLEX EXAM:         AP (cm)                   TRANSVERSE (cm)  Proximal             2.5 Cm                    2.5 cm  Mid                  4.0 cm                    4.2 cm  Distal               2.0 cm                    1.9 cm  Right Iliac          1.1 cm                    1.4 cm  Left Iliac           1.1 cm                    1.2 cm   PREVIOUS:  Date: 10/02/06  AP:  3.6  TRANSVERSE:  4.2   IMPRESSION:  Abdominal aortic aneurysm measurements are stable from  previous study bilaterally.   ___________________________________________  P. Liliane Bade, M.D.   MC/MEDQ  D:  04/02/2007  T:  04/02/2007  Job:  [id number]

## 2010-07-24 NOTE — Procedures (Signed)
DUPLEX ULTRASOUND OF ABDOMINAL AORTA   INDICATION:  Follow-up evaluation of known abdominal aortic aneurysm.   HISTORY:  Diabetes:  Orally controlled.  Cardiac:  MI in 1984.  Hypertension:  Yes.  Smoking:  Half-pack per day.  Connective Tissue Disorder:  Family History:  Previous Surgery:   DUPLEX EXAM:         AP (cm)                   TRANSVERSE (cm)  Proximal             2.9 cm                    2.7 cm  Mid                  4.2 cm                    4.4 cm  Distal               2.2 cm                    2.2 cm  Right Iliac          1.7 cm                    1.9 cm  Left Iliac           1.4 cm                    1.3 cm   PREVIOUS:  Date: 10/01/07  AP:  4.0  TRANSVERSE:  4.3   IMPRESSION:  Abdominal aortic aneurysm measurements are stable compared  to previous study.   ___________________________________________  P. Liliane Bade, M.D.   MC/MEDQ  D:  03/31/2008  T:  03/31/2008  Job:  [id number]

## 2010-07-24 NOTE — Assessment & Plan Note (Signed)
OFFICE VISIT   Derek Frye, Derek Frye  DOB:  Apr 01, 1940                                       09/18/2009  EAVWU#:[id number]   REASON FOR VISIT:  Follow-up heel ulcer and venous stasis.   HISTORY:  A 70 year old gentleman who I initially saw in May 2011 for a  left heel ulcer.  He had been followed by Dr. Madilyn Fireman in the past for an  abdominal aortic aneurysm most recent measuring 5 cm in 2010.  He is  scheduled to get a surveillance ultrasound in the future.  He has a  history of coronary bypass grafting where both saphenous  veins were  removed.  I took him for an arteriogram which revealed an occluded left  superficial femoral and popliteal artery with reconstitution of the  peroneal and posterior tibial arteries at separate origins.  Based on  the patient's wounds and well as limited options for revascularization,  I have recommended sending him to the [facility] for possible  hyperbaric oxygen.  Several days after I saw him, he developed a  cellulitis which required admission with antibiotic therapy.  This did  resolve.  He now comes back for follow-up.   Heart rate 79, blood pressure 114/64, respiratory rate 22.  GENERAL:  He is well-appearing, in no distress.  CARDIOVASCULAR:  Regular rate and rhythm.  ABDOMEN:  Obese but soft.  EXTREMITIES:  Reveal left heel ulcer approximately 1 cm.  No evidence of  infection.  Brawny discoloration and skin changes to both lower  extremities with pitting edema.   ASSESSMENT/PLAN:  Left leg ulcer.   PLAN:  I again reiterated today that the patient's high risk for  complications with bypass given that both greater saphenous veins have  been harvested and that he would need a bypass with the distal target as  a tibial vessel.  I am encouraging him to go to either be seen and  evaluated by the [facility] for possible hyperbaric oxygen therapy to  see if we get his wounds to heal with nonoperative measures.  I  will  plan on seeing him back in 6 weeks to see the progress he is making.  Should he get worse in the interim, he has been instructed to contact  me.  He will need to get a new abdominal ultrasound in the immediate  future.     Jorge Ny, MD  Electronically Signed   VWB/MEDQ  D:  09/18/2009  T:  09/19/2009  Job:  [id number]   cc:   Ardath Sax, M.D.  Lenon Curt Chilton Si, M.D.  Luis Abed, MD, [facility]

## 2010-07-24 NOTE — Assessment & Plan Note (Signed)
[facility]                            CARDIOLOGY OFFICE NOTE   HISHAM, PROVENCE                     MRN:          [id number]  DATE:01/29/2008                            DOB:          09/20/1940    Mr. Stemm is back for the followup of his multiple cardiac issues.  The  primary issue is his atrial fibrillation.  He is being coumadinized and  this is working well.  I am seeing him back now specifically to decide  if we want to proceed with cardioversion.  He is active.  He is not  having shortness of breath.  He shows no signs of heart failure or  shortness of breath or palpitations from his atrial fib.  I believe he  will be best served by staying in atrial fib.  He needs to be on long-  term Coumadin indefinitely regardless.   PAST MEDICAL HISTORY:   ALLERGIES:  MORPHINE.   MEDICATIONS:  See the flow sheet.   OTHER MEDICAL PROBLEMS:  See the prior notes.   REVIEW OF SYSTEMS:  He is not having any GI or GU symptoms.  He is not  having any headaches or eye problems or skin rashes.  Otherwise, the  review of systems is negative.   PHYSICAL EXAMINATION:  VITAL SIGNS:  Blood pressure is 128/68 with a  pulse of 66.  GENERAL:  The patient is oriented to person, time, and place.  Affect is  normal.  HEENT:  No xanthelasma.  He has normal extraocular motion.  NECK:  There are no carotid bruits.  There is no jugular venous  distention.  LUNGS:  Clear.  Respiratory effort is not labored.  CARDIAC:  S1 with an S2.  There are no clicks or significant murmurs.  ABDOMEN:  Protuberant, but soft.  EXTREMITIES:  He has peripheral edema, but it is not excessive for him.   Problems are listed completely on the note of November 24, 2007.  He is  stable.  I have carefully considered whether or not we should cardiovert  him.  We have decided to leave him in atrial fibrillation on Coumadin.  I will see him back in 3 months and followup.     Luis Abed, MD, [facility]  Electronically Signed    JDK/MedQ  DD: 01/29/2008  DT: 01/30/2008  Job #: [id number]

## 2010-07-24 NOTE — Procedures (Signed)
DUPLEX ULTRASOUND OF ABDOMINAL AORTA   INDICATION:  Followup evaluation of abdominal aortic aneurysm.   HISTORY:  Diabetes:  Orally controlled.  Cardiac:  MI in 1984.  Hypertension:  Yes.  Smoking:  Half pack per day.  Connective Tissue Disorder:  Family History:  Previous Surgery:   DUPLEX EXAM:         AP (cm)                   TRANSVERSE (cm)  Proximal             3.2 cm                    3.3 cm  Mid                  3.6 cm                    4.2 cm  Distal               2.9 cm                    3.8 cm  Right Iliac          1.5 cm                    1.5 cm  Left Iliac           1.4 cm                    1.4 cm   PREVIOUS:  Date: January 2008  AP:  4.2  TRANSVERSE:   IMPRESSION:  Abdominal aortic aneurysm measurements are stable from  previous study.   ___________________________________________  P. Liliane Bade, M.D.   MC/MEDQ  D:  10/02/2006  T:  10/03/2006  Job:  [id number]

## 2010-07-24 NOTE — Assessment & Plan Note (Signed)
[facility]                            CARDIOLOGY OFFICE NOTE   FLOR, HOUDESHELL                     MRN:          [id number]  DATE:04/22/2007                            DOB:          1940-11-03    [name] is here for cardiac followup.  He is stable from the cardiac  viewpoint.  He is not having any chest pain or shortness of breath.  His  weight is up.  Some of this may be fluid.  Some may be true weight gain.  He is not having any chest pain.  There has been no syncope or  presyncope.  He has known coronary disease.  He is status post CABG in  1985, with a redo in 1994, and a Myoview in 2007 with no ischemia.  He  has an ejection fraction of 44%.  He has an abdominal aortic aneurysm  that is being watched, and it is stable, and he is watched by Dr. Balinda Quails.   Today in the office, overall he is feeling well.   PAST MEDICAL HISTORY:   ALLERGIES:  MORPHINE.   MEDICATIONS:  1. Altace.  2. Amaryl.  3. Lipitor.  4. Lasix 40.  5. KCl 20.  6. Aspirin 81.  7. Metolazone 2.5 as needed for increased fluid.   OTHER MEDICAL PROBLEMS:  See the list below.   REVIEW OF SYSTEMS:  He has no major complaints at this time, and his  review of systems is negative.   PHYSICAL EXAMINATION:  VITAL SIGNS:  Weight is 285 pounds.  Blood  pressure is 113/63, with a pulse of 84.  GENERAL:  The patient is oriented to person, time, and place.  Affect is  normal.  HEENT:  Reveals no xanthelasma.  He has normal extraocular motion.  NECK:  There are no carotid bruits.  There is no jugular venous  distention.  LUNGS:  Clear.  Respiratory effort is not labored.  CARDIAC:  Reveals an S1 with an S2.  There are no clicks or significant  murmurs.  ABDOMEN:  Obese.  EXTREMITIES:  He does have 1+ peripheral edema.   EKG is done and then very carefully repeated.  There is slight noise in  the baseline.  The rhythm is most likely atrial fibrillation.  In  the  past, his EKGs have shown a very small P-wave, and it has been my  feeling that he has had sinus and that he is not in an atrial  fibrillation.  This may represent a change today.   PROBLEM LIST:  1. Hypertension.  2. Obesity.  3. Status post cholecystectomy.  4. Coronary disease, status post CABG, and then redo in 94.  5. Ejection fraction 44%.  6. Hyperlipidemia, treated.  7. Cigarette use, for which he used Chantix, and he is doing better      next.  8. Knee difficulties.  9. History of prostate cancer, with seed implant.  10.Abdominal aortic aneurysm that is stable and being watched by Dr.      Dr. Demetrius Charity. Liliane Bade.  11.Question of atrial fibrillation at  this time.   The patient is not having symptoms.  The issue is whether or not he  should be started on Coumadin.  His CHADS Score analysis reveals that he  does have volume overload problems, and so congestive heart failure has  to be counted.  He does have a history of hypertension.  He is not 73.  He does not have diabetes, and he has not had a stroke.  Therefore, his  CHADS Score is 2, and this would indicate that most probably Coumadin  would be indicated.  Before starting this, I plan to see him back in  followup to repeat his EKG and further consider the situation.  We  discussed the issue of Coumadin, but we will not started it as of today.     Luis Abed, MD, [facility]  Electronically Signed    JDK/MedQ  DD: 04/22/2007  DT: 04/24/2007  Job #: [id number]   cc:   Gloriajean Dell. Andrey Campanile, M.D.

## 2010-07-24 NOTE — Procedures (Signed)
LOWER EXTREMITY ARTERIAL DUPLEX   INDICATION:  Ulcer, left heel.   HISTORY:  Diabetes:  Yes.  Cardiac:  CABG.  Hypertension:  Yes.  Smoking:  Yes.  Previous Surgery:  CABG.   SINGLE LEVEL ARTERIAL EXAM                          RIGHT                LEFT  Brachial:               121                  127  Anterior tibial:        125                  60  Posterior tibial:       119                  58  Peroneal:  Ankle/Brachial Index:   0.98                 0.47   LOWER EXTREMITY ARTERIAL DUPLEX EXAM   DUPLEX:  Left superficial femoral artery is occluded from the proximal  to the distal popliteal artery with a small branch of collateral flow at  proximal superficial femoral artery.   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   ___________________________________________  V. Charlena Cross, MD   NT/MEDQ  D:  07/04/2009  T:  07/04/2009  Job:  [id number]   cc:   Max T. Hyatt, D.P.M.

## 2010-07-24 NOTE — Assessment & Plan Note (Signed)
OFFICE VISIT   Derek Frye, TOWRY  DOB:  05-15-1940                                       08/21/2009  JYNWG#:[id number]   REASON FOR VISIT:  Follow up leg wounds.   HISTORY:  This is a 70 year old gentleman who had been followed in the  past for aneurysm by Dr. Madilyn Fireman who is sent over by Dr. Al Corpus for  evaluation of left heel ulcer that had been there for 6 weeks.  He went  for an arteriogram in late May which showed superficial femoral and  popliteal occlusion with limited evaluation of the runoff but it  appeared to be a posterior tibial and peroneal reconstitution visualized  from separate origins.  The patient has a history of new having his  saphenous vein removed for heart surgery.  He does have brawny  discoloration of both legs from the knee down as well as 1 to 2+ pitting  edema.  He was not a candidate for percutaneous repair.  I sent him to  the wound center.  He is in the process of being evaluate for hyperbaric  therapy.   PHYSICAL EXAMINATION:  His blood pressure is 141/82, heart 76,  temperature 98.0.  GENERAL:  Well-appearing, in no distress.  Respirations are nonlabored.  ABDOMEN:  Soft, nontender.  CARDIOVASCULAR:  Regular rate and rhythm.  EXTREMITIES:  Patient has brawny edema up to the knee bilaterally.  There is an open heel ulcer approximately 1 x 1 cm and multiple open  areas on the lateral aspect of his foot.   ASSESSMENT/PLAN:  Left leg ulcer.   PLAN:  Based on the results of the patient's arteriogram, he is not a  candidate for percutaneous repair.  Due to the swelling and brawny  appearance of his leg, I am concerned that he will have some significant  problems with wound healing if he does undergo bypass.  In addition, he  does not have vein for bypass which would be a tibial bypass.  I think  his surgical options are limited and I would strongly encourage  hyperbaric therapy if it will be beneficial based on his oxygen  tension  measurements.  I will plan on seeing him back in 3 weeks to see what  kind of progress he is making.  I did give her a prescription for  Levaquin as he had some concerns over an anaphylactic reaction to his  former antibiotic.  I also gave him prescription for an EpiPen.     Jorge Ny, MD  Electronically Signed   VWB/MEDQ  D:  08/21/2009  T:  08/22/2009  Job:  [id number]   cc:   Ardath Sax, M.D.  Lenon Curt Chilton Si, M.D.  Luis Abed, MD, [facility]
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
## 2010-07-24 NOTE — Procedures (Signed)
DUPLEX ULTRASOUND OF ABDOMINAL AORTA   INDICATION:  Followup evaluation of known abdominal aortic aneurysm.   HISTORY:  Diabetes:  Orally controlled.  Cardiac:  MI in 1984.  Hypertension:  Yes.  Smoking:  1/2 pack per day.  Connective Tissue Disorder:  Family History:  Previous Surgery:  No.   DUPLEX EXAM:         AP (cm)                   TRANSVERSE (cm)  Proximal             2.6 cm                    2.1 cm  Mid                  4.0 cm                    4.3 cm  Distal               3.5 cm                    3.9 cm  Right Iliac          1.8 cm                    2.0 cm  Left Iliac           1.9 cm                    1.9 cm   PREVIOUS:  Date:  04/02/2007  AP:  4.0  TRANSVERSE:  4.2   IMPRESSION:  Abdominal aortic aneurysm measurements are stable from  previous study.   ___________________________________________  P. Liliane Bade, M.D.   MC/MEDQ  D:  10/01/2007  T:  10/01/2007  Job:  [id number]

## 2010-07-24 NOTE — Procedures (Signed)
DUPLEX ULTRASOUND OF ABDOMINAL AORTA   INDICATION:  Followup evaluation of known abdominal aortic aneurysm.   HISTORY:  Diabetes:  Yes.  Cardiac:  CABG 2 times.  Hypertension:  Yes.  Smoking:  Yes.  Connective Tissue Disorder:  Family History:  None.  Previous Surgery:  No.   DUPLEX EXAM:         AP (cm)                   TRANSVERSE (cm)  Proximal             2.94 cm                   2.75 cm  Mid                  4.38 cm                   4.90 cm  Distal               2.32 cm                   2.31 cm  Right Iliac          Unable to image due to bowel gas  Unable to image due to bowel gas  Left Iliac           Unable to image due to bowel gas  Unable to image due to bowel gas   PREVIOUS:  Date:  AP:  4.2  TRANSVERSE:  4.4   IMPRESSION:  Slight increase in abdominal aortic aneurysm since last  study done on 04/03/2008.   ___________________________________________  P. Liliane Bade, M.D.   AC/MEDQ  D:  09/01/2008  T:  09/01/2008  Job:  [id number]

## 2010-07-24 NOTE — Assessment & Plan Note (Signed)
[name]                            CARDIOLOGY OFFICE NOTE   Derek Frye, Derek Frye                     MRN:          [id number]  DATE:11/24/2007                            DOB:          05/19/1940    Derek Frye is here for cardiology followup.  He is actually feeling  relatively well.  He is not having any significant chest pain.  He has  had no syncope or presyncope.  He is following with Dr. Benedetto Goad.  His hemoglobin A1c was significantly elevated.  Dr. Andrey Campanile has added  some additional medications.  His most recent LDL was 68.  His HDL was  37 and triglycerides 96 on 20 mg of Lipitor.  He has had a followup  ultrasound of his abdomen.  This is being followed for an abdominal  aortic aneurysm by Dr. Liliane Bade and it is stable and he is being  followed.   When I saw him last in February 2009, there was question that he has had  atrial fib.  Returning today, his EKG again shows atrial fib.  He does  not sense his atrial fib.  He has very small P-waves when he is in sinus  rhythm, but he clearly is in atrial fib at this time.   PAST MEDICAL HISTORY:   ALLERGIES:  MORPHINE.   MEDICATIONS:  1. Altace 5.  2. Amaryl 4.  3. Lipitor 20.  4. Lasix 40.  5. KCl 20.  6. Aspirin 81.  7. Metolazone 2.5 on a p.r.n. basis.  8. Metformin 500 b.i.d.  9. An antibiotic for a recent cyst that was drained.   OTHER MEDICAL PROBLEMS:  See the list below.   REVIEW OF SYSTEMS:  He is feeling well at this time.  He is not having  any significant complaints.  Otherwise, the review of systems is  negative.   PHYSICAL EXAMINATION:  VITAL SIGNS:  Weight is 270 which is down from  286 in February.  Blood pressure is 114/66 with the pulse of 72.  GENERAL:  The patient is oriented to person, time, and place.  Affect is  normal.  HEENT:  No xanthelasma.  He has normal extraocular motion.  NECK:  There are no carotid bruits.  There is no jugular venous  distention.  LUNGS:  Clear.  Respiratory effort is not labored.  CARDIAC:  An S1 with an S2.  There are no significant murmurs.  ABDOMEN:  Obese, but soft.  EXTREMITIES:  The patient has significant chronic venous changes in his  legs.  At the moment, he does not have marked edema but there is mild  edema.   EKG reveals atrial fibrillation with a controlled ventricular response.   PROBLEMS:  1. Hypertension.  This is controlled at this time.  2. Obesity.  He has lost 15 pounds, but he has a long way to go.  3. Status post cholecystectomy.  4. Coronary disease status post CABG followed by a redo in 1994.  5. Ejection fraction of 45%.  6. Hyperlipidemia, treated.  7. Cigarette use.  He is now smoking cigars  and needs to quit.  8. History of knee difficulties and surgery.  9. History of prostate cancer with a seed implant and this is stable.  10.History of abdominal aortic aneurysm that is stable, being followed      by Dr. Liliane Bade.  11.Atrial fibrillation.  His rate is controlled.  He clearly is going      in and out of atrial fib.  He needs to be on Coumadin.  I have      discussed this with him and he is willing to be on Coumadin.   The patient needs to be started on Coumadin.  He also needs a carotid  Doppler.  He has proven diffuse vascular disease and we need to reassess  his carotids.     Derek Abed, MD, [facility]  Electronically Signed    JDK/MedQ  DD: 11/24/2007  DT: 11/25/2007  Job #: [id number]   cc:   Derek Frye. Andrey Campanile, M.D.

## 2010-07-24 NOTE — Assessment & Plan Note (Signed)
[facility]                            CARDIOLOGY OFFICE NOTE   HRISTOPHER, MISSILDINE                     MRN:          [id number]  DATE:05/01/2007                            DOB:          07-14-1940    Mr. Bublitz is seen back in followup after his visit on April 22, 2007.  At that time, it was quite difficult to determine what his rhythm  was by EKG.  There was baseline interference, and I could not see his T  waves well.  I was questioning whether or not he had atrial  fibrillation.  Today he returns.  He has sinus rhythm with PACs.  He has  an unusual P wave, but there is a P wave.  I believe that this is not  atrial fibrillation.  On this basis, we do not need to talk about  Coumadin.  Otherwise, he is stable and not having symptoms.   I reviewed all these issues with the patient.  He today feels relatively  well.  It will be very important for him to lose weight.   PAST MEDICAL HISTORY:   ALLERGIES:  MORPHINE   MEDICATIONS:  Altace, Amaryl, Lipitor, Lasix, potassium, aspirin and  metolazone as needed.   OTHER MEDICAL PROBLEMS:  See the list on my prior note of April 21, 2007.   REVIEW OF SYSTEMS:  He is stable today.   PHYSICAL EXAMINATION:  VITAL SIGNS:  Blood pressure is 119/69 with a  pulse of 72.  GENERAL:  The patient is oriented to person, time and place.  Affect is  normal.  LUNGS:  Clear.  CARDIAC:  Exam reveals S1-S2.  He has no peripheral edema today, but he  is overweight.   I have had a careful discussion with him about his weight.  He must  begin to lose weight.  His rhythm is not atrial fibrillation, and he  does not need Coumadin.  I will see him back in 6 months.     Luis Abed, MD, [facility]  Electronically Signed    JDK/MedQ  DD: 05/01/2007  DT: 05/02/2007  Job #: [id number]

## 2010-07-27 ENCOUNTER — Encounter: Payer: Self-pay | Admitting: Infectious Disease

## 2010-07-27 NOTE — Op Note (Signed)
NAME:  Derek Frye, Derek Frye NO.:  [id number]   MEDICAL RECORD NO.:  [id number]          PATIENT TYPE:  INP   LOCATION:  1509                         FACILITY:  [facility]   PHYSICIAN:  Madlyn Frankel. Charlann Boxer, M.D.  DATE OF BIRTH:  [birth year]   DATE OF PROCEDURE:  12/17/2005  DATE OF DISCHARGE:                                 OPERATIVE REPORT   PREOPERATIVE DIAGNOSIS:  Persistent and recurrent right knee effusions  associated with a proximal medial tibial fascial defect and large  outpouching of joint fluid.  No signs of clinical infection.   POSTOPERATIVE DIAGNOSIS:  Persistent and recurrent right knee effusions  associated with a proximal medial tibial fascial defect and large  outpouching of joint fluid.  No signs of clinical infection.   PROCEDURES:  1. I&D incision and drainage and debridement of right knee including the      skin, subcu tissue, bone, synovial tissue.  2. Polyethylene exchange revision knee, polyethylene exchange, with an      Osteonics size 11 tibial based plate with a 12 mm thick polyethylene      posterior stabilized insert.  3. Attempted primary fascial closure.   SURGEON:  Durene Romans, M.D.   ASSISTANT:  Cherly Beach and Dwyane Luo, [place]   ANESTHESIA:  General.   BLOOD LOSS:  Minimal.   TOURNIQUET TIME:  80 minutes at 250 mmHg.   DRAINS:  Times one.   COMPLICATIONS:  None.   PATHOLOGY SPECIMENS:  We sent joint fluid off the knee which resulted in no  white cells identified and no bacteria.   PROCEDURE:  Mr. Hands is a 70 year old patient of Dr. Worthy Rancher who has  had a history of a total knee replacement performed May 02, 2003.  His  recovery was unremarkable.  No positive wound drainage or infection.  In May  of this year of 2007 he began having swelling over the anterior aspect of  the knee.  This was seen and evaluated by his primary care physician and  then Dr. Darrelyn Hillock.  It had been aspirated several times with a workup for  infection been negative including bone scan and aspirate values which  revealed no white cells and no bacteria with growth.  Given the recurrent  nature of his effusions in this area and the plan at this point was an I&D,  polyethylene exchange, and evaluation of the potential source of this  swelling and then attempted closure.  The presumptive diagnosis was a  fascial defect resulting in this sinus tract drainage.   I reviewed with him the risks and benefits, the possibility of recurrence,  the possibility of needing further surgery.  This was in addition to the  standard risks of infection, DVT, and persistent pain postoperatively.   PROCEDURE IN DETAIL:  The patient was brought to the operative theater.  Once adequate anesthesia and preoperative antibiotics, and 1 gram of Ancef,  were administered the patient was positioned supine with a proximal thigh  tourniquet placed.  The right lower extremity was then prepped and draped in  sterile fashion following a pre scrub.  The patient's previous incision was  incised.  The area over this proximal medial tibial swelling was noted to be  extremely thin.  At the distal portion of this I actually got into this  cystic fluid sac and a large amount of fluid drained out the knee at this  point.  The knee was brought to extension and cultures obtained deep in the  knee.  Further exposure of the knee was carried out including raising skin  flaps.  A medial parapatellar arthrotomy was then created.  Flaps and  debridement eventually identified a large defect in this proximal medial  fascia then measured about 1.5 cm in diameter.  It was down at the level  near the pes bursa.   The joint fluid was very clear.  There was no signs of osseous purulence.   Then further knee exposure was then carried out including synovectomies and  adipose debridement and scar debridement.  I then removed the old  polyethylene with the use an osteotome without  complication.  At this point  the wound was copiously irrigated with 3 liters of pulse lavage normal  saline solution.   I then replaced the polyethylene with the same size insert as the size  higher was not possible.   Please note that a manipulation under anesthesia and exam under anesthesia  indicated that the patient's knee was stable from extension to flexion and  did not appear to have a whole lot of instability issues in flexion.   For this reason the final 12 mm insert was then impacted back into the  tibia.   At this point the significant more part of the procedure was directed at  trying to close this area.  Synovial flaps were created including this  envelope, the fascial enveloped.  Relaxing incisions and fenestrations were  carried out into this medial soft tissue sleeve in an effort to try to  reapproximate this defect.  I was unsuccessful.  I was able to reduce the  size of it down to about 5-7 mm, but yet there was still exposed bone  underneath it.   A #1 Vicryl was used to reapproximate this area as well as the extensor  mechanism.   At this point without the consent for any further procedures, the plan at  this point was for primary closure with 2-0 Vicryl in the subcu layer.  It  was reapproximated carefully particularly in this distal portion and then  using skin staples on the skin once the medium Hemovac drain was placed deep  to suction.   The knee was then dressed sterilely with Xeroform dressing, sponges, two  ABD's over this distal area, two wraps of 6 inch cast padding and an Ace  wrap.  The knee was then placed in a knee immobilizer.   Postoperative plan will call for the patient to be remaining in the knee  immobilizer without movement or range of motion activities with suction  drainage at least straight for 2 weeks.  I will keep the Hemovac drain in place for a couple days at least and on antibiotics in the meantime.   The idea is to try to  remove all potential fluid from the joint as we are  trying to get this wound to heal up.      Madlyn Frankel Charlann Boxer, M.D.  Electronically Signed     MDO/MEDQ  D:  12/17/2005  T:  12/19/2005  Job:  [id number]

## 2010-07-27 NOTE — H&P (Signed)
NAME:  Derek Frye, Derek Frye NO.:  [id number]   MEDICAL RECORD NO.:  [id number]                   PATIENT TYPE:  INP   LOCATION:                                       FACILITY:  [facility]   PHYSICIAN:  Ebbie Ridge. Paitsel, P.A.               DATE OF BIRTH:  03-09-1941   DATE OF ADMISSION:  05/02/2003  DATE OF DISCHARGE:                                HISTORY & PHYSICAL   PREADMISSION H&P   HISTORY:  The patient has had right knee pain for the past year.  He fell  approximately 1 year ago and has had increasing pain since that time. He has  been ambulating with crutches due to the severity of his pain.  The patient  had a left total knee arthroplasty with Dr Fraser Din.  He has done very well  since that time. He did have a complication with that procedure. He  sustained a cat bite which infected his prosthesis and it had to be removed,  cement spacers put in, and a new knee prosthesis replaced in May of 1996.  Since that time he has done very well and has had no further sign of  infection. The patient elected to proceed with a right total knee  arthroplasty.  __________ morphine causes nausea and vomiting.   PRIMARY CARE Marajade Lei:  Gloriajean Dell. Andrey Campanile, M.D.   CARDIOLOGIST:  Willa Rough, M.D.   PAST MEDICAL HISTORY:  1. Hypertension.  2. Coronary artery disease.  3. History of an MI in 1984.  4. Non-insulin-dependent diabetes mellitus.  5. Arthritis.   PREVIOUS SURGICAL HISTORY:  1. Patient had CABG in 1985 and 1994.  2. Left knee replacement in 1995 with removal of prosthesis and placement of     cement spacers.  3. A new prosthesis put in May of 1996.   FAMILY HISTORY:  Brother has heart disease and diabetes.  Father had a  stroke as well as arthritis.   CURRENT MEDICATIONS:  1. Altace 5 mg daily.  2. Potassium 20 mEq daily.  3. Lasix 40 mg daily.  4. Lipitor 20 mg daily.  5. Amaryl 4 mg daily.   REVIEW OF SYSTEMS:  GENERAL:  Denies weight change, fever,  chills or  fatigue. HEENT:  Denies headache, visual changes, __________ or sore throat.  CARDIOVASCULAR:  Denies chest pain, palpitations, shortness of breath or  orthopnea.  PULMONARY:  Denies dyspnea, wheezing, cough, sputum production  or hemoptysis.  GU:  Denies dysuria, frequency, urgency or hematuria.  ENDOCRINE:  Denies polyuria, polydipsia, appetite changes, or cold  intolerance.  MUSCULOSKELETAL:  The patient has right knee pain with painful  range of motion.  NEUROLOGIC:  Denies dizziness, vertigo, syncope or  seizures.  SKIN:  Denies itching rashes, masses or moles.   PHYSICAL EXAMINATION:  VITAL SIGNS:  Temperature 98.4, pulse 64,  respirations 20, blood pressure 120/70.  GENERAL:  A 70 year old male  in no acute distress.  HEENT:  PERRL.  EOMs intact.  Pharynx clear.  TMs intact.  NECK:  Supple without masses.  CHEST:  Clear to auscultation bilaterally.  No wheezing, rales or rhonchi  noted.  HEART:  Regular rate and rhythm without murmur.  ABDOMEN:  Positive bowel sounds, soft, nontender, no organomegaly or  abnormal masses.  EXTREMITIES:  Examination of the right knee reveals a very painful limited  range of motion.  SKIN:  Warm and dry.   X-RAY:  X-ray of his right knee reveals severe degenerative arthritis and  genu varus deformity.   IMPRESSION:  Degenerative joint disease right knee.   PLAN:  The patient is being admitted to [facility] on May 02, 2003 to undergo a right total knee arthroplasty.                                                Ebbie Ridge. Paitsel, P.A.    LKP/MEDQ  D:  04/21/2003  T:  04/21/2003  Job:  [id number]

## 2010-07-27 NOTE — Op Note (Signed)
NAME:  Derek Frye, Derek Frye NO.:  [id number]   MEDICAL RECORD NO.:  [id number]                   PATIENT TYPE:  INP   LOCATION:  2889                                 FACILITY:  [facility]   PHYSICIAN:  Georges Lynch. Darrelyn Hillock, M.D.             DATE OF BIRTH:  [birth year]   DATE OF PROCEDURE:  05/02/2003  DATE OF DISCHARGE:                                 OPERATIVE REPORT   SURGEON:  Georges Lynch. Darrelyn Hillock, M.D.   ASSISTANT:  Ebbie Ridge. Paitsel, P.A.   PREOPERATIVE DIAGNOSIS:  Severe degenerative arthritis with a degenerative  varus deformity of the right knee.   POSTOPERATIVE DIAGNOSIS:  Severe degenerative arthritis with a degenerative  varus deformity of the right knee.   PROCEDURE:  Right total knee arthroplasty.  The sizes used were as follows:  Patella size 26 patella, femur size 11 right posterior cruciate sacrificing  type femoral component, tibial tray size 11, insert 12 mm thickness flex  insert.   DESCRIPTION OF PROCEDURE:  Under general anesthesia, the patient first had a  femoral nerve block on the right in the holding area.  He was taken back  under general anesthesia and routine orthopedic prep and drape of the right  lower extremity was carried out.  He had 1 gram of IV Ancef.  At this time,  the leg was exsanguinated with Esmarch and the tourniquet was elevated with  375 mmHg.  An anterior approach to the knee was carried out.  Bleeders  identified and cauterized.  Two flaps were created.  I then went down and  made a median parapatellar approach reflecting the patella laterally and  removed all the spurs from the patella, the femur, and the tibia.  I then  excised the anterior and posterior cruciate ligaments.  I did medial and  lateral meniscectomies.  I then thoroughly debrided all of the loose bodies  as well.  My initial femoral cut was made at 12 mm off of the distal femur.  This was done by utilizing intramedullary devices.  Next, a #2 jig was  inserted for a size 11 femur.  I carried out my anterior, posterior, and  chamfering cuts.  We then prepared the tibia by removing 4 mm thickness off  the effected medial side of the tibia.  At this particular time, we then cut  our patellar groove and our notch cut out of the distal femur.  We  thoroughly debrided out the area, irrigated the area out, and then went  through our trials.  We felt that a size 11 right femur with a size 11 tray  and a 12 mm thickness insert was the most stable insert and we had excellent  motion.  We then cut our patellar groove cut, removed 10 mm thickness off  the articular surface of the patella.  Three drill holes were made in the  patella.  I then flexed the knee  and cut my keel cut out of the proximal  tibial plateau.  Once the cuts were all made, we thoroughly irrigated out  the area, dried the area out, and cemented all three components in  simultaneously.  We then after cement hardened looked for loose pieces of  cement and removed all loose pieces of cement.  We then inserted our 10 mm  thickness tibial tray and then the 12.  The 12 fit much better.  We finally  irrigated out the area, dried the knee out, and inserted our permanent 12 mm  thickness flex tibial tray.  We then put the knee through motion and we had  excellent stability.  A small lateral release was carried out.  I then  inserted a Hemovac drain and the knee was closed in layers in the usual  fashion.  Sterile dressings were applied.  We did utilize the Hemovac drain.  The size of the patella was a 26 patella.  We utilized 1 gram of Vancomycin  per bag of cement, there were 2 grams total used in the cement.                                              Ronald A. Darrelyn Hillock, M.D.   RAG/MEDQ  D:  05/02/2003  T:  05/02/2003  Job:  [id number]

## 2010-07-27 NOTE — Op Note (Signed)
NAME:  Derek Frye, Derek Frye NO.:  [id number]   MEDICAL RECORD NO.:  [id number]          PATIENT TYPE:  AMB   LOCATION:  [facility]                         FACILITY:  [facility]   PHYSICIAN:  Maretta Bees. Vonita Moss, M.D.DATE OF BIRTH:  1941-03-05   DATE OF PROCEDURE:  10/09/2005  DATE OF DISCHARGE:                                 OPERATIVE REPORT   PREOPERATIVE DIAGNOSIS:  Carcinoma of the prostate.   POSTOPERATIVE DIAGNOSIS:  Carcinoma of the prostate.   PROCEDURES:  Radioactive seed implantation of the prostate and cystoscopy.   SURGEON:  Dr. Larey Dresser.   ASSISTANT:  Dr. Chipper Herb.   ANESTHESIA:  General.   INDICATIONS:  This 70 year old gentleman had a PSA of 1.8 in 2003 and was  noted to be 15.2 in March of this year.  He was sent to me for evaluation  and prostate ultrasound and biopsy was performed that showed Gleason VI  carcinoma in 80% of the tissue on the right and Gleason VI in 30% of the  tissue on the left. He underwent further evaluation, radiotherapy  consultation and has undergone external beam radiotherapy and now presents  for radioactive seed implantation.   PROCEDURE:  The patient is brought to the operating room, placed in the  lithotomy position, underwent treatment planning per ultrasound probe.  He  then underwent radioactive seed implantation utilizing ultrasound control to  place 56 I125 seeds utilizing 23 activated needles and two anchor needles.  Seeds were in good position on ultrasound and radiographic evaluation.  The  total apparent activity was 16.01 mCi. At the conclusion of the  procedure, his Foley catheter was removed and he was cystoscoped and the  anterior urethra, prostatic urethra and bladder had no evidence of injury or  seeds.  Foley catheter was reinserted and perineal bandage applied and the  patient sent to the recovery room in good condition, having tolerated the  procedure well.      Maretta Bees. Vonita Moss, M.D.  Electronically Signed     LJP/MEDQ  D:  10/09/2005  T:  10/09/2005  Job:  [id number]   cc:   Maryln Gottron, M.D.  Fax: [phone]   Gloriajean Dell. Andrey Campanile, M.D.  Fax: [phone]   Willa Rough, M.D.  [address]  [address]  [place]  [place] [address]

## 2010-07-27 NOTE — H&P (Signed)
NAME:  Derek Frye, Derek Frye NO.:  [id number]   MEDICAL RECORD NO.:  [id number]          PATIENT TYPE:  INP   LOCATION:  NA                           FACILITY:  [facility]   PHYSICIAN:  Madlyn Frankel. Charlann Boxer, M.D.  DATE OF BIRTH:  1940-06-25   DATE OF ADMISSION:  DATE OF DISCHARGE:                                HISTORY & PHYSICAL   CHIEF COMPLAINT:  Right knee swelling and pain.   HISTORY OF PRESENT ILLNESS:  [name] is a 70 year old male with a history  of right total knee replacement on May 02, 2003.  His recovery was  unremarkable from the surgery.  In May of 2007, he began to have swelling  over the anterior aspect of the knee.  He had several aspirations.  He had  been placed on antibiotics, due to a positive polymorphonuclear find of the  synovial aspirate.  He has had no fevers, chills or night sweats.   PAST MEDICAL HISTORY:  Significant for diabetes, hypertension, coronary  artery disease with a history of multiple bypass surgeries.   FAMILY HISTORY:  Includes diabetes and coronary artery disease, stroke,  arthritis.   SOCIAL HISTORY:  He smokes one pack of cigarettes per day and has for the  last 40 years.   DRUG ALLERGIES:  INCLUDE MORPHINE WHICH MAKES HIM NAUSEOUS.   MEDICATIONS:  Include:  1. Lipitor.  2. Altace.  3. Amaryl.  4. Lasix,  5. Potassium.  6. Aspirin 81 mg.   REVIEW OF SYSTEMS:  He has had no new chest pain, no shortness of breath, no  diarrhea or constipation or abdominal pain, no headache, no change in  vision, no increase in urinary frequency or painful urination.   PAST MEDICAL HISTORY:  Prostate disease with SEED implant in August of 2007.   PHYSICAL EXAMINATION:  VITAL SIGNS:  Temperature 98.1, pulse 72,  respirations 18, blood pressure 150/80.  GENERAL:  Awake, alert and oriented, well-developed, well-nourished male.  NECK:  Supple, no lymphadenopathy, no carotid bruits noted.  CHEST:  Lungs to the left was clear.  He had  some mild middle lobe rales  with some expiratory wheezing.  BREASTS:  Deferred  HEART:  Regular rate and rhythm.  No gallops, clicks rubs or murmurs.  S1,  S2 distinct.  No splitting noted.  ABDOMEN:  Soft, nontender, nondistended.  Bowel sounds positive, all 4  quadrants.  GENITOURINARY:  Deferred.  EXTREMITIES:  Obvious swelling in the inferior to anterior aspect of his  knee.  There was no active drainage.  Dorsalis pedis pulses were intact.  SKIN:  No cellulitis or streaking.  NEUROLOGIC:  He had intact sensibilities distal to his right knee.   LABORATORY:  Pending.  Old lab shows synovial PMNs, increased C-reactive  protein of 3.6.  New labs are pending.   X-rays of his right knee AP and lateral showing a total knee prosthesis.   IMPRESSION:  Anterior knee drainage, most likely joint line communication.   PLAN OF ACTION:  Open I&D of the right knee with polyethylene exchange to be  performed by surgeon, Dr. Molli Hazard  Charlann Boxer on December 17, 2005.  All risks and  complications were discussed.  Questions were encouraged and answered.     ______________________________  Yetta Glassman [address], [place]      Madlyn Frankel. Charlann Boxer, M.D.  Electronically Signed    BLM/MEDQ  D:  12/11/2005  T:  12/12/2005  Job:  [id number]   cc:   Gloriajean Dell. Andrey Campanile, M.D.  Fax: [phone]

## 2010-07-27 NOTE — Discharge Summary (Signed)
NAME:  Derek Frye, Derek Frye NO.:  [id number]   MEDICAL RECORD NO.:  [id number]                   PATIENT TYPE:  INP   LOCATION:  5004                                 FACILITY:  [facility]   PHYSICIAN:  Georges Lynch. Darrelyn Hillock, M.D.             DATE OF BIRTH:  [birth year]   DATE OF ADMISSION:  05/02/2003  DATE OF DISCHARGE:  05/06/2003                                 DISCHARGE SUMMARY   ADMISSION DIAGNOSES:  1. Degenerative arthritis, right knee.  2. Hypertension.  3. Coronary artery disease.  4. History of myocardial infarction in 1994.  5. Noninsulin-dependent diabetes.   DISCHARGE DIAGNOSES:  1. Degenerative arthritis, right knee, status post right total knee     arthroplasty.  2. Hypertension.  3. Coronary artery disease.  4. History of myocardial infarction.  5. Noninsulin-dependent diabetes.   PROCEDURES:  The patient was taken to the operating room on May 02, 2003, to undergo a right total knee arthroplasty.  Surgeon:  Georges Lynch.  Darrelyn Hillock, M.D.  Assistant:  Ebbie Ridge. Paitsel, P.A.  The surgery was performed  under general anesthesia.  A Hemovac drain was placed at the time of  surgery.   BRIEF HISTORY:  This patient is a 70 year old male who has had right knee  pain for the past year.  He fell approximately one year ago and has had  increasing pain in his right knee since that time.  He has been ambulating  with crutches due to the severity of the pain.  The patient had a left total  knee arthroplasty by Dr. Fraser Din several years ago.  He has done very well  since that time.  He did have complication with that procedure.  He  sustained a cat bite, which infected his prosthesis and it had to be  removed, as well as cement spacers put in and then a new prosthesis replaced  in May of 1996.  Since that time, he has done very well with no further  signs of infection.  The patient has elected to proceed with a right total  knee arthroplasty.   LABORATORY  DATA:  Admission CBC with WBC 7.7, RBC 4.83, hemoglobin 15.0,  hematocrit 42.8, and platelet count 178.  PT 14.1, INR 1.1, PTT 43 (slightly  elevated).  Chemistry normal, except for slight elevation in glucose to 111.  The preadmission urinalysis was negative. The patient's blood type was O  positive.  Negative antibody screen.  Preoperative x-ray of the right knee  reveals severe degenerative arthritis.  Postoperative x-ray of the right  knee reveals the right total knee prosthesis to be in good alignment.  I am  unable to locate the preoperative EKG on the medical record.   HOSPITAL COURSE:  The patient was taken to the operating room on May 02, 2003, to undergo a right total knee arthroplasty.  The patient tolerated  the procedure well and was allowed  to returned to the recovery room and then  to the orthopedic floor to continue his postoperative care.  A Hemovac drain  was placed at the time of surgery.  That was discontinued on postoperative  day #1.  The patient was started on PC analgesic for pain control.  He was  weaned over to oral analgesics by postoperative day #3 and the PCA was  discontinued.  PT and OT were consulted for gait training and ambulation.  The patient progressed very well and was able to ambulate greater than 200  feet by postoperative day #5 when the patient was discharged home.   DISPOSITION:  The patient was discharged home on May 06, 2003.   CONDITION AT THE TIME OF DISCHARGE:  Improved.   FOLLOWUP:  The patient is scheduled to follow up in the office with Dr.  Darrelyn Hillock two weeks from the date of surgery.  He will call the office and  schedule an appointment.      Ebbie Ridge. Paitsel, P.A.                     Ronald A. Darrelyn Hillock, M.D.    Tilden Dome  D:  06/18/2003  T:  06/19/2003  Job:  [id number]

## 2010-07-27 NOTE — Assessment & Plan Note (Signed)
[facility]                            CARDIOLOGY OFFICE NOTE   ARMONDO, CECH                     MRN:          [id number]  DATE:04/22/2006                            DOB:          02-17-1941    Mr. Santa is doing well as of today. Over several months, he had had  some pain in his knee and was drinking some excess alcohol and during  that period had volume overload. He saw Dr. Andrey Campanile who added low-dose  metolazone 2.5 every other day and the patient has had a very good  diuresis and he is feeling better. The patient only takes rare  metolazone now. He is now having chest pain. His breathing is better.   PAST MEDICAL HISTORY:   ALLERGIES:  MORPHINE.   MEDICATIONS:  1. Altace 5.  2. Amaryl 4.  3. Lipitor 20.  4. Lasix 40.  5. Kay Ciel 20.  6. Aspirin 81.  7. Metolazone on a p.r.n. basis.   OTHER MEDICAL PROBLEMS:  See the list below.   REVIEW OF SYSTEMS:  He is feeling well and he is stable and his review  of systems is negative.   PHYSICAL EXAMINATION:  VITAL SIGNS:  Weight today is 271 pounds. Blood  pressure 127/73 with a pulse of 77.  GENERAL:  The patient is oriented to person, time and place. Affect is  normal.  HEENT:  There is no xanthelasma. He has normal extraocular motion. There  are no carotid bruits. There is no jugular venous distention.  CARDIAC:  Reveals an S1 and S2. There are no clicks or significant  murmurs.  ABDOMEN:  Obese.  EXTREMITIES:  The patient has 1+ peripheral edema which is chronic and  he has chronic venous stasis changes in his legs and this is baseline  for him.   PROBLEM LIST:  1. Hypertension treated.  2. Obesity. He clearly needs to lose weight.  3. Status post cholecystectomy.  4. 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      Myoview in 2007 showed an ejection fraction of 44% and no ischemia.  5. Hyperlipidemia on medication.  6. Continued cigarette use. He  tried Chantix and did well with it. His      wife was concerned about him using the medication and I told them      that I would be in favor of him using it if he was stable.  7. Difficulties with his knee. This is being very carefully followed.  8. Prostate cancer with seed implant.  9. Abdominal aortic aneurysm. I have referred him to Dr. Liliane Bade      and he is seeing Dr. Madilyn Fireman.   Cardiac is stable. No further workup at this time.     Luis Abed, MD, [facility]  Electronically Signed    JDK/MedQ  DD: 04/22/2006  DT: 04/22/2006  Job #: [id number]   cc:   Gloriajean Dell. Andrey Campanile, M.D.

## 2010-07-27 NOTE — Assessment & Plan Note (Signed)
[facility]                              CARDIOLOGY OFFICE NOTE   THEODIS, KINSEL                     MRN:          [id number]  DATE:02/04/2006                            DOB:          1940/03/20    Mr. Wessells is doing well. He has known coronary disease and has been stable  over time. The patient had a CT scan of his abdomen done dated April 2007.  The study showed that he had no evidence of metastatic disease from his  prostate. He does have cholelithiasis. He has a 4.2 cm infrarenal abdominal  aortic aneurysm. This is stable at that time but he needs followup for this  and I will be making an appointment for him to see Dr. Liliane Bade for  followup. It appears that the CT scan was done at [facility] dated July 01, 2005. The patient has not had any significant  symptoms from this.   I saw Mr. Baumbach last in May of 2007. At that time I cleared him for his  prostate procedures. Also since that time he has had a procedure to his  right knee. He had a recurring fluid pocket that was oversewn. He is stable  with this. Overall he is not having any significant chest pain. He is not  having any major shortness of breath at this time. His weight is 284 pounds  which is actually good for him overall.   PAST MEDICAL HISTORY:   ALLERGIES:  MORPHINE.   MEDICATIONS:  1. Altace 5.  2. Amaryl 4.  3. Lipitor 20.  4. Lasix 40.  5. Kay Ciel 20.  6. Baby aspirin.   OTHER MEDICAL PROBLEMS:  See the list below.   REVIEW OF SYSTEMS:  As mentioned, overall he is doing well. He feels well  and is not having any chest pain. Otherwise his review of systems is  negative.   PHYSICAL EXAMINATION:  VITAL SIGNS:  Weight today is 284 pounds. Blood  pressure is 143/79. His pulse is 77.  GENERAL:  The patient is oriented to person, time and place. Affect is  normal.  HEENT:  Reveals no xanthelasma. He has normal extraocular motion. There  are  no carotid bruits. There is no jugular venous distention.  LUNGS:  Clear. Respiratory effort is not labored.  CARDIAC:  Reveals an S1 with an S2. He has no clicks or significant murmurs.  ABDOMEN:  Obese but soft. It is difficult to assess for the possibilities of  masses. I do not hear any bruits.   The patient does have significant venous disease and he does have peripheral  edema. It is not has bad as it has been but he might benefit from further  diuresis. We will start by talking to him about watching his salt and fluid  intake and checking a BMET.   EKG revealed sinus rhythm.   PROBLEM LIST:  1. Hypertension treated.  2. Obesity.  3. Status post cholecystectomy.  4. 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 His Myoview scan done in  2007 showed no ischemia and an ejection fraction of 44%.  5. Hyperlipidemia on medications. He needs a fasting lipid profile.  6. Continue cigarette use. Of course we have talked about stopping.  7. Difficulties with his right knee and he had a procedure that is      improved.  8. Prostate cancer and he has 50 seeds implanted.  9. Abdominal aortic aneurysm. See the first paragraph. This was shown by a      CT scan done through [facility]. We will make him an appointment      to see Dr. Liliane Bade for complete evaluation of this.   We will make the appointment with Dr. Madilyn Fireman. A BMET and fasting lipids will  be checked. I will then see him back to decide about further diuretics.     Luis Abed, MD, [facility]  Electronically Signed    JDK/MedQ  DD: 02/04/2006  DT: 02/04/2006  Job #: [id number]   cc:   Balinda Quails, M.D.  Maretta Bees. Vonita Moss, M.D.  Gloriajean Dell. Andrey Campanile, M.D.

## 2010-07-27 NOTE — Discharge Summary (Signed)
NAME:  Derek Frye, Derek Frye NO.:  [id number]   MEDICAL RECORD NO.:  [id number]          PATIENT TYPE:  INP   LOCATION:  1509                         FACILITY:  [facility]   PHYSICIAN:  Madlyn Frankel. Charlann Boxer, M.D.  DATE OF BIRTH:  06/24/1940   DATE OF ADMISSION:  12/17/2005  DATE OF DISCHARGE:  12/20/2005                                 DISCHARGE SUMMARY   ADMITTING DIAGNOSES:  1. Status post right total knee replacement.  2. Diabetes type 2.  3. Hypertension.  4. Coronary artery disease with coronary bypass.  5. Prostate cancer.  6. Abdominal aortic aneurysm.   DISCHARGE DIAGNOSES:  1. Status post right total knee replacement.  2. Diabetes type 2.  3. Hypertension.  4. Coronary artery disease with history of coronary artery bypass graft.  5. Prostates cancer.  6. Abdominal aortic aneurysm.   PROCEDURE:  Patient was admitted to hospital for incision and drainage of  his right knee and a polyethylene exchanges.  Surgeon was Dr. Durene Romans.  First assistant was [facility], second assistant was [facility]   CONSULTATION:  Pharmacy for Coumadin management.   BRIEF HISTORY:  Derek Frye is a 70 year old male with a history of right  total knee replacement on May 02, 2003.  He has been seen several times  by Dr. Charlann Boxer and is well known to our clinic.  He is recovery from surgery  was unremarkable.  In May of 2007, he began to have swelling over the  anterior aspect of the knee.  He had several aspirations.  He had been  placed on antibiotics, and due to a positive polymorphonuclear find of the  synovial aspirate, it was necessary to bring him to the operating theatre  and perform an incision and drainage, polyethylene exchanging to close the  deep fascia.  He had had no recent feverish chills or night sweats.   PRE-ADMISSION LABS:  1. Complete blood count.  Pre-admission showed his hemoglobin to be 14.4,      his hematocrit to be 40.5.  Upon discharge,  routine blood count found      his hemoglobin to be 11.5 and hematocrit 33.2.  2. Pre-admission white blood cell differential all within normal limits.  3. Pre-admission coagulation found his PT/PTT and INR to be 14.3, 44 and      1.1 respectively.  After re-initiation of his Coumadin during his      postoperative stay, his discharge PT was 17.0, and his INR was 1.3.      Final reading was 18.7.  PT and INR was 1.5.  4. Routine pre-admission chemistry showed his glucose to be 127, within      normal limits, and upon discharge his glucose was 134.  5. Pre-admission hemoglobin A1c was 5.7.  6. Pre-admission urinalysis showed amber-color small bilirubin.  7. Body fluid cultures cultured during the I&D showed right knee white      blood cells are present, predominantly polymonouclear cells, no growth      after 3 days.  Additional body fluids cultured:  No organisms seen.  Gram stains:  No organisms seen.  Anaerobic cultures:  Few white blood      cells, predominantly, no organisms seen, no anaerobes isolated.   Cardiology cleared Derek Frye pre-operatively.   HOSPITAL COURSE:  Patient tolerated the procedure well, was admitted to  orthopedic floor.  Patient remained neuromuscularly and vascularly intact  during his course of stay in the right lower extremity, distal to the  operative incision.  Wound remained intact throughout his course of stay.  His dressing was checked post-op day 1, and the dressing remained clean, dry  and intact.  It was changed on post-op day 2, with no significant ooze or  drainage noted.  Physical therapy did a post-op day assessment, began  physical therapy after post-op day number 1.  Patient was touch down  weightbearing.  He was able to accomplish activities of daily living,  including bed mobility, transfers and ambulation and was taught how to go up  and down stairs.  By post-op day 2, he still continued to be touchdown  weightbearing, was able to  ambulate 80 feet with a rolling walker with leg  in full extension and knee immobilizer.  It was decided during the course of  the day to keep this leg in full extension to minimize any tension on the  deep fascia layer that had been closed.  We did want to keep him in the full  extension for several more days.   CONDITION ON DISCHARGE:  Discharge condition was stable and improved.   DISCHARGE DIET:  As tolerated by diabetic condition.   DISCHARGE MEDICATIONS:  Included home medications which included:  1. Lipitor.  2. Altace.  3. Amaryl.  4. Lasix.  5. Potassium.  6. Had been taking aspirin 81 mg but that was put on hold, due to his      Coumadin and blood-thinning needs.   In addition, discharge medications included:  1. Coumadin per pharmacy which was 5 mg daily at 6:00 p.m.  2. Norco 5/325 1-2 p.o. q.4-6. p.r.n. pain.  3. Robaxin 500 mg, 1-2 p.o. q.4-6 p.r.n. muscle spasm pain.  4. Colace 100 mg, 1 up to four times daily.  5. Iron supplements 325 x3 daily.   DISCHARGE THERAPY:  Patient will remain touch-down weight bearing.  Knee  will be kept in an immobilizer and at full extension.  To allow for deep  tissue healing.  Will reassess his postoperative status at his first post-op  check to determine need for knee range of motion.  Primary goals of physical  therapy are to be helped with upper body strengthening, balance with  activities of daily living.   DISCHARGE WOUND CARE:  Keep wound dry, change dressing daily.   DISCHARGE FOLLOWUP:  Patient will follow with Dr. Charlann Boxer at [phone] within 10  days for first postop check and incision check.  Orthopedically, he can call  us if his condition worsens, if the sight becomes warm or significant  oozing.  If patient develops any acute respiratory, shortness of breath, or  any acute severe calf pain, he should contact emergency services  immediately.     ______________________________ Derek Frye, [place]      Madlyn Frankel.  Charlann Boxer, M.D.  Electronically Signed    BLM/MEDQ  D:  01/09/2006  T:  01/09/2006  Job:  [id number]

## 2010-07-30 ENCOUNTER — Ambulatory Visit
Admission: RE | Admit: 2010-07-30 | Discharge: 2010-07-30 | Disposition: A | Discharge status: Home / Self Care | Payer: Medicare Other | Source: Ambulatory Visit | Attending: Surgery | Admitting: Surgery

## 2010-07-30 ENCOUNTER — Ambulatory Visit (INDEPENDENT_AMBULATORY_CARE_PROVIDER_SITE_OTHER): Payer: Medicare Other | Admitting: Surgery

## 2010-07-30 DIAGNOSIS — I714 Abdominal aortic aneurysm, without rupture: Secondary | ICD-10-CM

## 2010-07-30 MED ORDER — IOHEXOL 350 MG/ML SOLN
100.0000 mL | Freq: Once | INTRAVENOUS | Status: AC | PRN
  Administered 2010-07-30: 100 mL via INTRAVENOUS

## 2010-07-31 NOTE — Assessment & Plan Note (Signed)
OFFICE VISIT  Derek Frye, Derek Frye DOB:  01/21/1941                                       07/30/2010 ZOXWR#:[id number]  The patient comes back today for followup.  Dr. Madilyn Fireman had been following his aneurysm.  However, I got involved in his care May of 2011 for left heel ulcer.  He underwent arteriogram which showed an occluded left superficial femoral and popliteal artery with reconstitution of the posterior tibial and peroneal arteries at separate origins.  Based on the amount of swelling in his leg as well as the fact that his vein harvested for his CABG I felt he was an extremely poor candidate for operative bypass and he has been sent to the wound center for wound care.  Since I last saw him he has had several knee operations for, and has had issues with infected hardware.  He also has an open wound on his left ankle.  He has no abdominal pain.  PHYSICAL EXAMINATION:  Vital signs:  Heart rate is 72, blood pressure 105/61, O2 sat 97%.  General:  He is somewhat weak in appearance but in no acute distress.  Respirations:  Are nonlabored.  Abdomen:  Soft and nontender.  DIAGNOSTIC STUDIES:  CT angiogram was performed today which shows by my measurements a 5.1 cm maximum diameter infrarenal abdominal aortic aneurysm with no evidence of rupture.  ASSESSMENT:  Abdominal aortic aneurysm.  PLAN:  I feel like with multiple issues that the patient is dealing with regarding his left knee as well as his issues with infection I think this is a very poor time to proceed with aneurysm repair.  In addition, I do not feel like his aneurysm size has reached 5.5 cm therefore I think it is reasonable to consider observing this.  I will plan on seeing him back in 6 months with an ultrasound to evaluate his aneurysm.  With regards to his left leg wounds.  Again, I think he is a very poor operative candidate. I would maximize wound care therapy.  This could lead to limb  loss.  The patient is aware of all this.    Jorge Ny, MD Electronically Signed  VWB/MEDQ  D:  07/30/2010  T:  07/31/2010  Job:  [id number]  cc:   Madlyn Frankel Charlann Boxer, M.D. Ardath Sax, M.D. Lenon Curt Chilton Si, M.D. Luis Abed, MD, [facility]

## 2010-08-02 ENCOUNTER — Ambulatory Visit (INDEPENDENT_AMBULATORY_CARE_PROVIDER_SITE_OTHER): Payer: Self-pay | Admitting: Cardiology

## 2010-08-02 DIAGNOSIS — R0989 Other specified symptoms and signs involving the circulatory and respiratory systems: Secondary | ICD-10-CM

## 2010-08-02 LAB — PROTIME-INR: INR: 2.4 — AB (ref <=1.1)

## 2010-08-20 ENCOUNTER — Ambulatory Visit (INDEPENDENT_AMBULATORY_CARE_PROVIDER_SITE_OTHER): Payer: Self-pay | Admitting: Cardiology

## 2010-08-20 DIAGNOSIS — R0989 Other specified symptoms and signs involving the circulatory and respiratory systems: Secondary | ICD-10-CM

## 2010-08-21 ENCOUNTER — Ambulatory Visit: Payer: Self-pay | Admitting: Vascular Surgery

## 2010-09-04 ENCOUNTER — Other Ambulatory Visit (INDEPENDENT_AMBULATORY_CARE_PROVIDER_SITE_OTHER): Payer: Medicare Other | Admitting: Infectious Disease

## 2010-09-04 ENCOUNTER — Other Ambulatory Visit: Payer: Self-pay | Admitting: Cardiology

## 2010-09-04 DIAGNOSIS — B999 Unspecified infectious disease: Secondary | ICD-10-CM

## 2010-09-05 ENCOUNTER — Other Ambulatory Visit: Payer: Self-pay | Admitting: *Deleted

## 2010-09-05 NOTE — Telephone Encounter (Signed)
Refilled levaquin per verbal orders from md. Called pt & transferred to front to make a f/u appt

## 2010-09-10 ENCOUNTER — Ambulatory Visit (INDEPENDENT_AMBULATORY_CARE_PROVIDER_SITE_OTHER): Payer: Self-pay | Admitting: Cardiology

## 2010-09-10 DIAGNOSIS — R0989 Other specified symptoms and signs involving the circulatory and respiratory systems: Secondary | ICD-10-CM

## 2010-09-10 LAB — POCT INR: INR: 5

## 2010-09-14 ENCOUNTER — Other Ambulatory Visit: Payer: Self-pay | Admitting: Pharmacist

## 2010-09-14 MED ORDER — WARFARIN SODIUM 2.5 MG PO TABS: 2.5000 mg | ORAL_TABLET | ORAL | Status: DC

## 2010-09-17 ENCOUNTER — Ambulatory Visit (INDEPENDENT_AMBULATORY_CARE_PROVIDER_SITE_OTHER): Payer: Self-pay | Admitting: Internal Medicine

## 2010-09-17 DIAGNOSIS — R0989 Other specified symptoms and signs involving the circulatory and respiratory systems: Secondary | ICD-10-CM

## 2010-09-17 LAB — POCT INR: INR: 3.2

## 2010-09-24 ENCOUNTER — Ambulatory Visit (INDEPENDENT_AMBULATORY_CARE_PROVIDER_SITE_OTHER): Payer: Self-pay | Admitting: Cardiology

## 2010-09-24 DIAGNOSIS — R0989 Other specified symptoms and signs involving the circulatory and respiratory systems: Secondary | ICD-10-CM

## 2010-09-24 LAB — POCT INR: INR: 2.7

## 2010-10-08 ENCOUNTER — Ambulatory Visit (INDEPENDENT_AMBULATORY_CARE_PROVIDER_SITE_OTHER): Payer: Self-pay | Admitting: Cardiology

## 2010-10-08 DIAGNOSIS — R0989 Other specified symptoms and signs involving the circulatory and respiratory systems: Secondary | ICD-10-CM

## 2010-10-19 ENCOUNTER — Ambulatory Visit (INDEPENDENT_AMBULATORY_CARE_PROVIDER_SITE_OTHER): Payer: Medicare Other | Admitting: *Deleted

## 2010-10-19 DIAGNOSIS — Z8679 Personal history of other diseases of the circulatory system: Secondary | ICD-10-CM

## 2010-10-19 DIAGNOSIS — Z7901 Long term (current) use of anticoagulants: Secondary | ICD-10-CM

## 2010-10-19 DIAGNOSIS — I4891 Unspecified atrial fibrillation: Secondary | ICD-10-CM

## 2010-10-19 IMAGING — CR DG KNEE COMPLETE 4+V*L*
4 series · 4 of 4 positions shown · non-contrast
Comparison: None.

CLINICAL DATA: Knee pain and inability to bear weight for 1 day.

LEFT KNEE - COMPLETE 4+ VIEW

[view not recorded (1 of 4)]
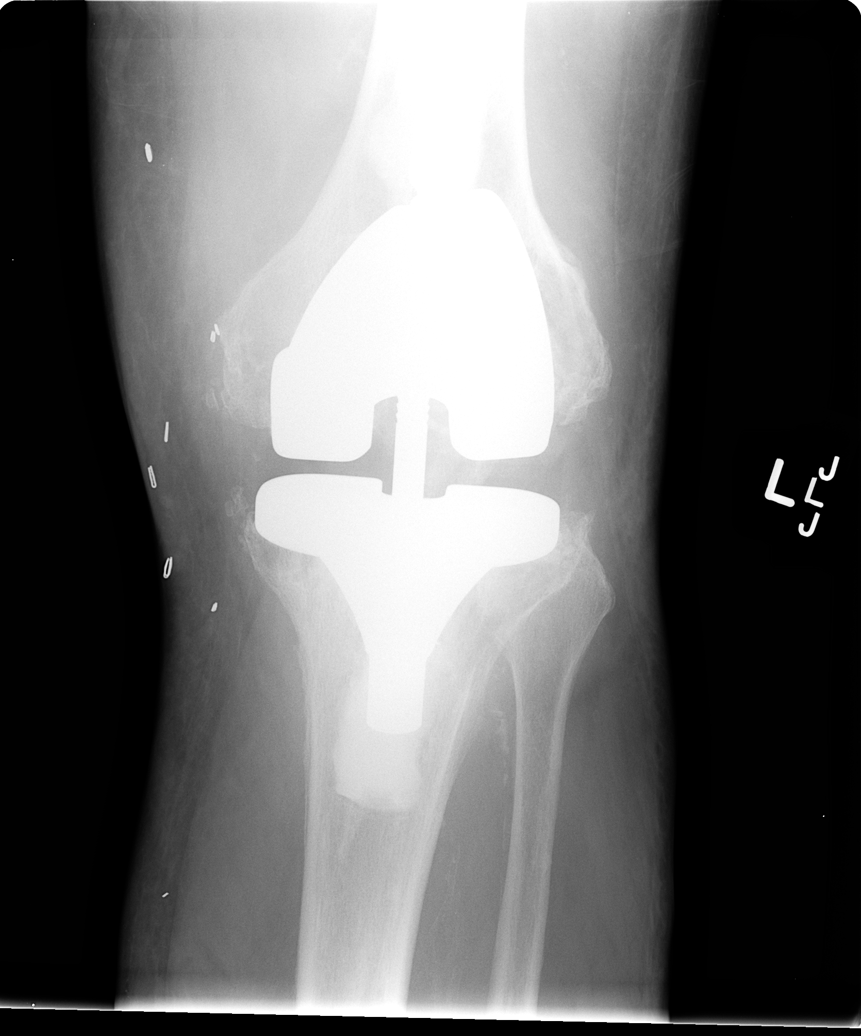

[view not recorded (2 of 4)]
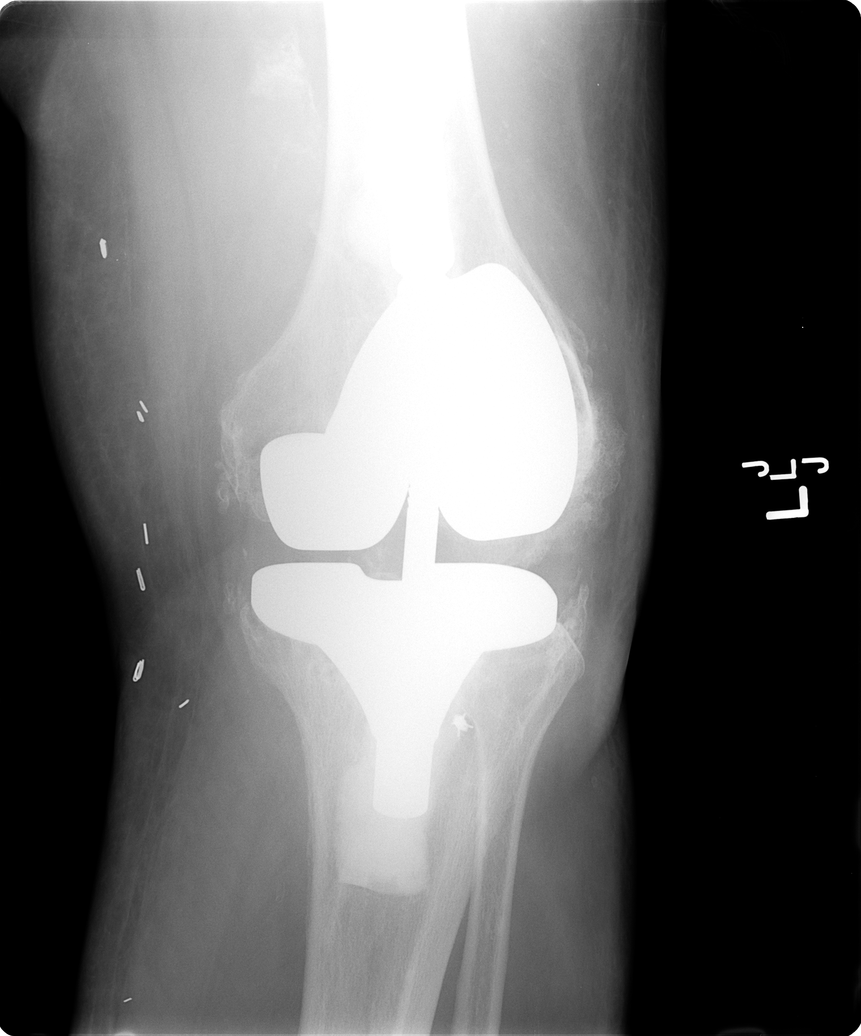

[view not recorded (3 of 4)]
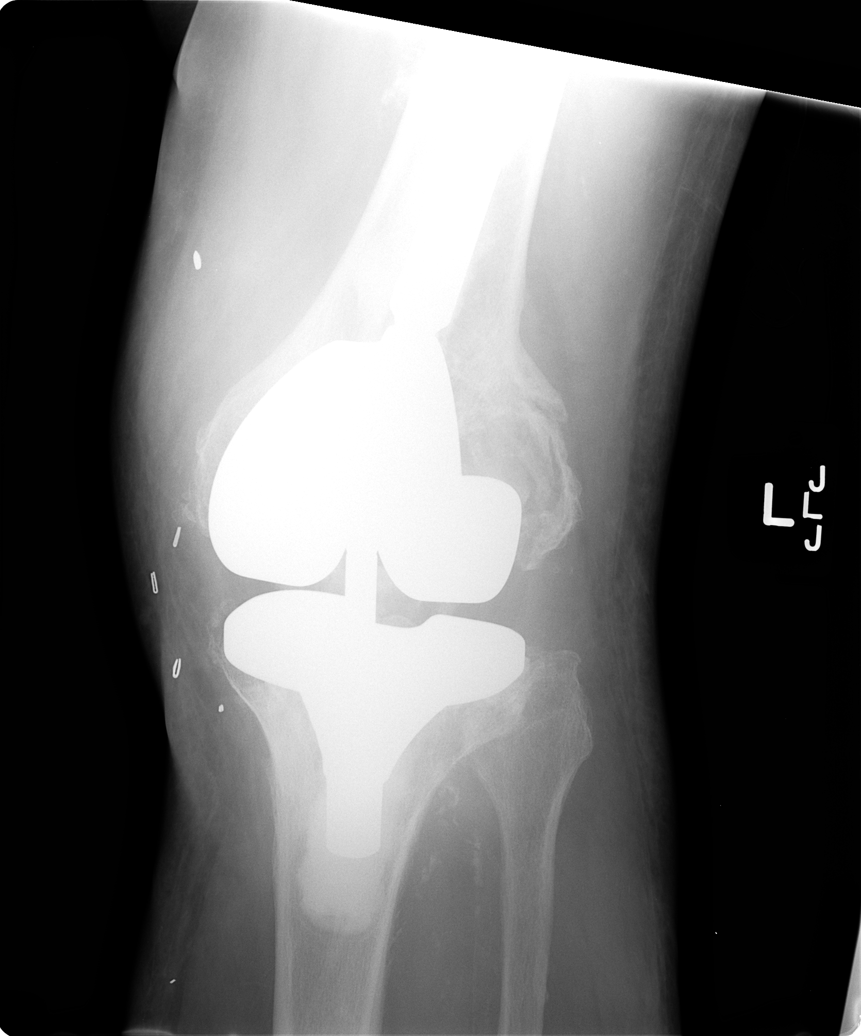

[view not recorded (4 of 4)]
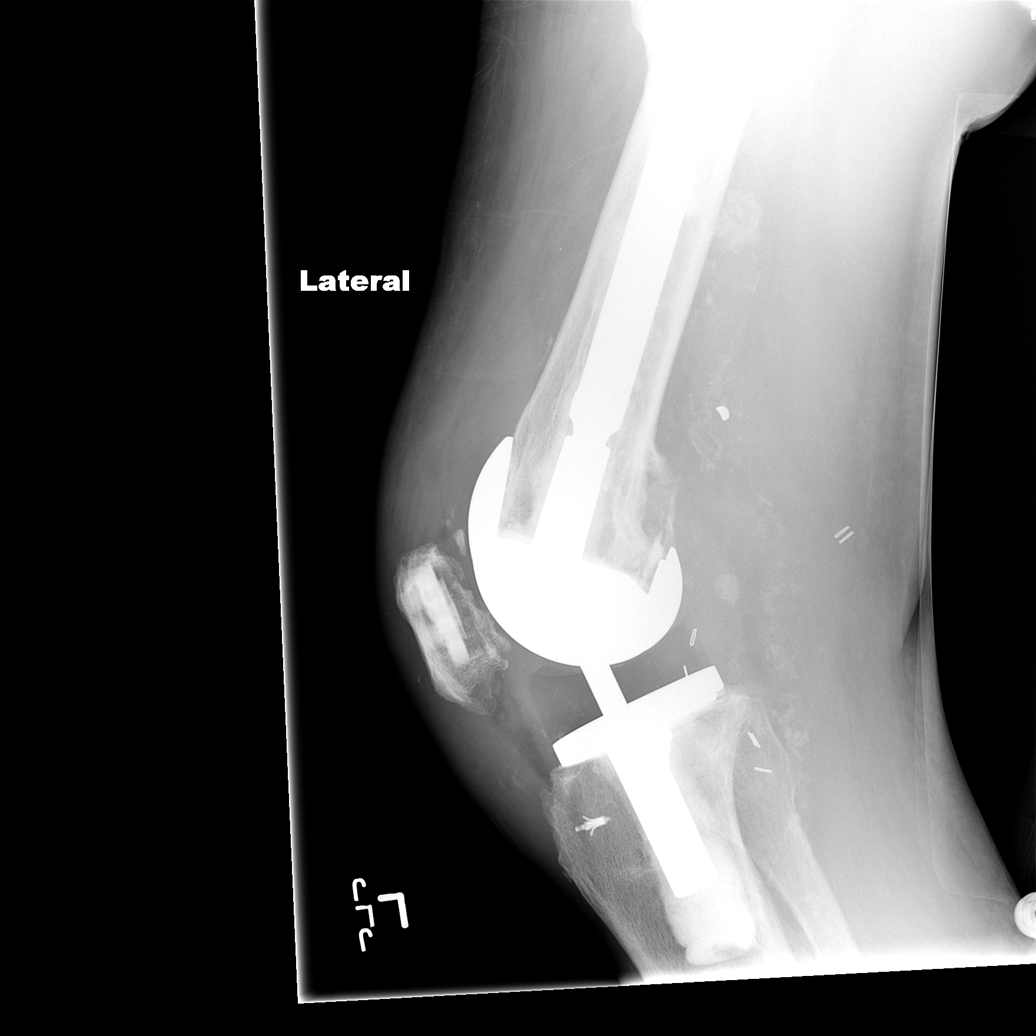

[4 of 4 positions shown; findings below may reference images not displayed]

FINDINGS: Patient is status post total knee arthroplasty.  The
proximal end of the femoral prosthesis is only imaged in the
lateral projection.  On that view, there is probable chronic
deformity of the distal femoral cortex anteriorly.  More acute
appearing irregularity of the lateral femoral condyle is noted on
one of the oblique views which could reflect an acute fracture.
The proximal fibula and tibia appear intact.  There is evidence of
a moderate sized knee joint effusion.  There are diffuse vascular
calcifications surrounding the knee.
IMPRESSION: Possible acute fracture of the lateral femoral condyle with an
associated knee joint effusion.  CT is suggested for further
evaluation.

## 2010-10-19 IMAGING — CR DG CHEST 2V
4 series · 4 of 4 positions shown · non-contrast
Comparison: 09/21/2009

CLINICAL DATA: Chest pain

CHEST - 2 VIEW

[w chest pa *]
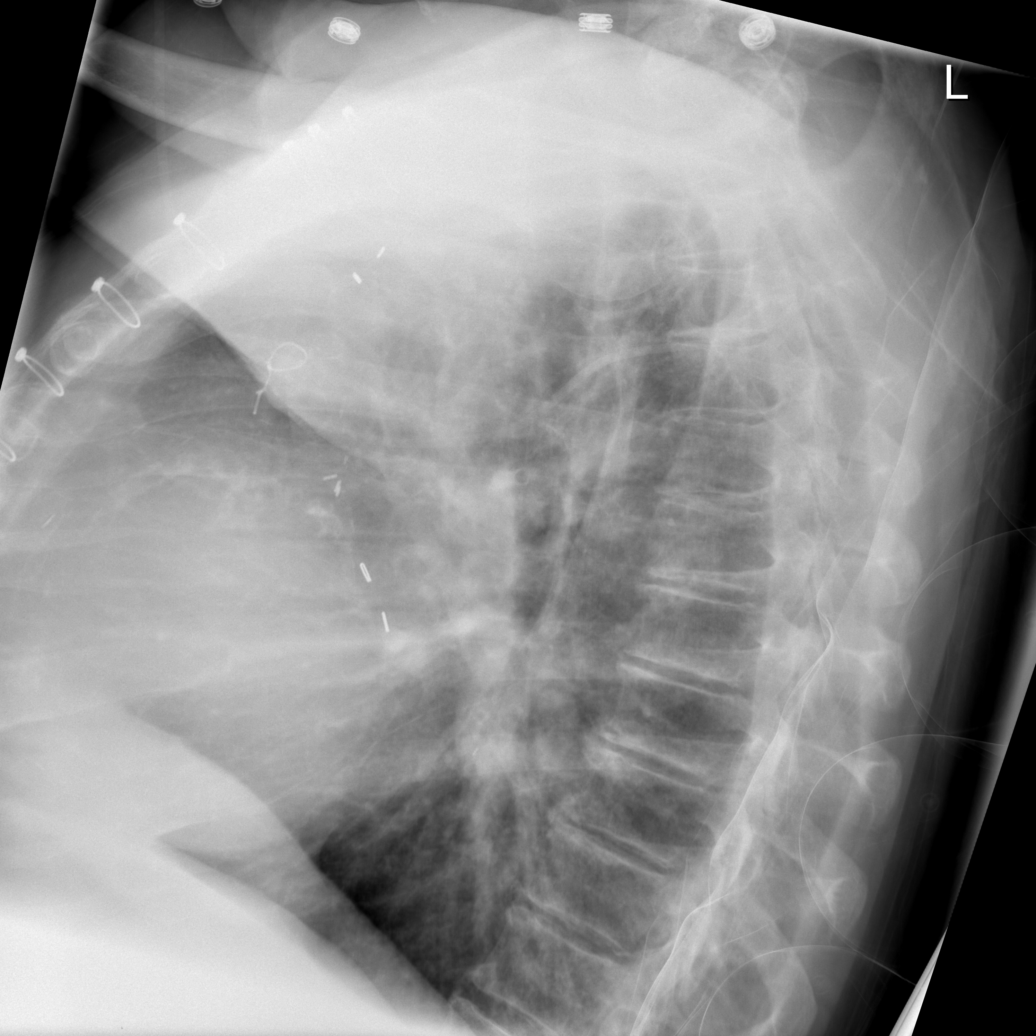

[w chest lat (1 of 2)]
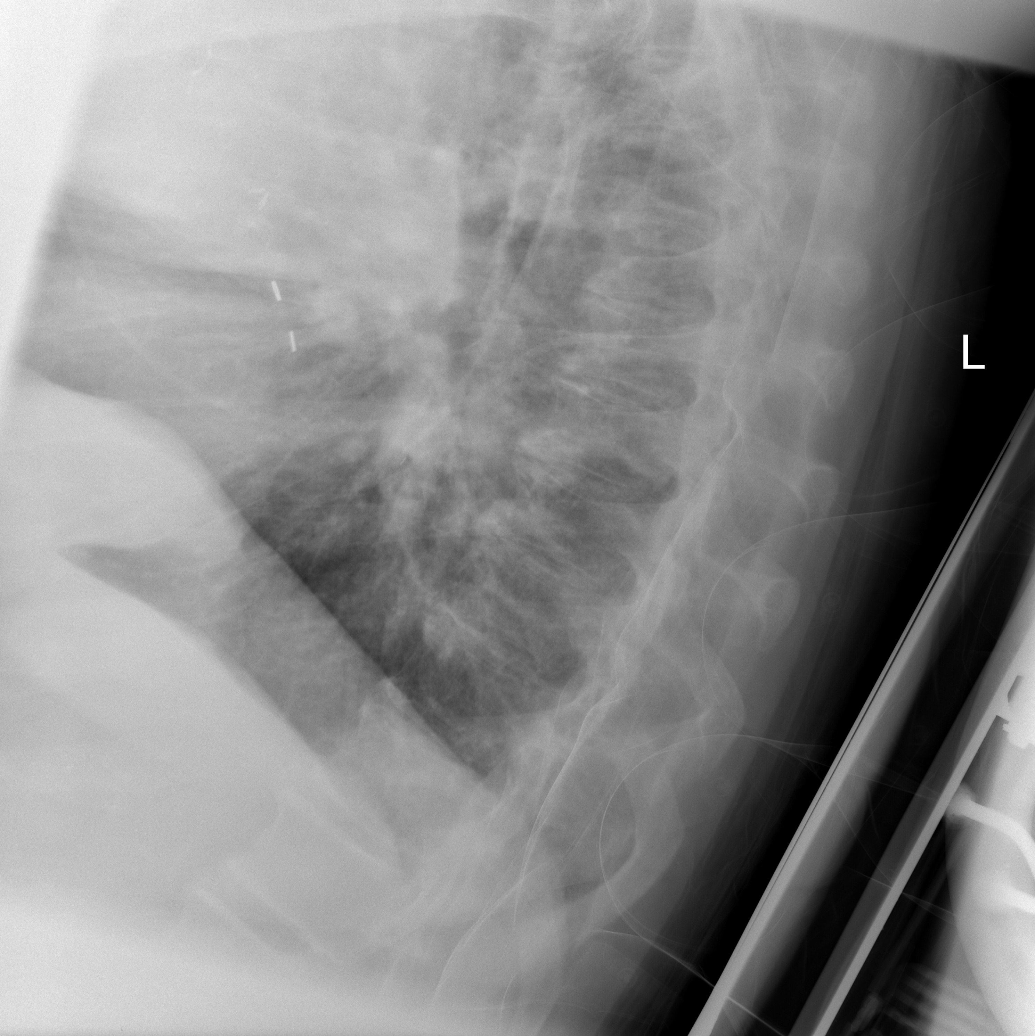

[w chest lat (2 of 2)]
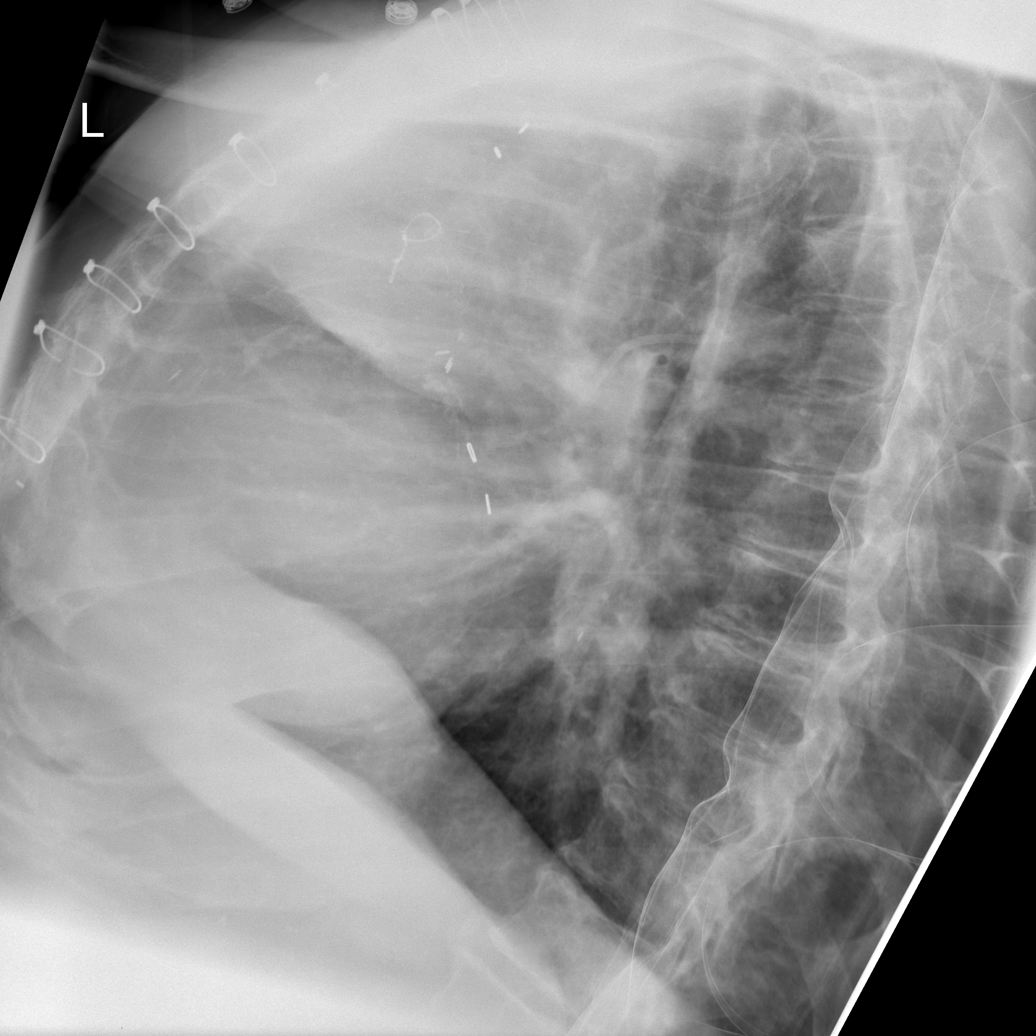

[view not recorded]
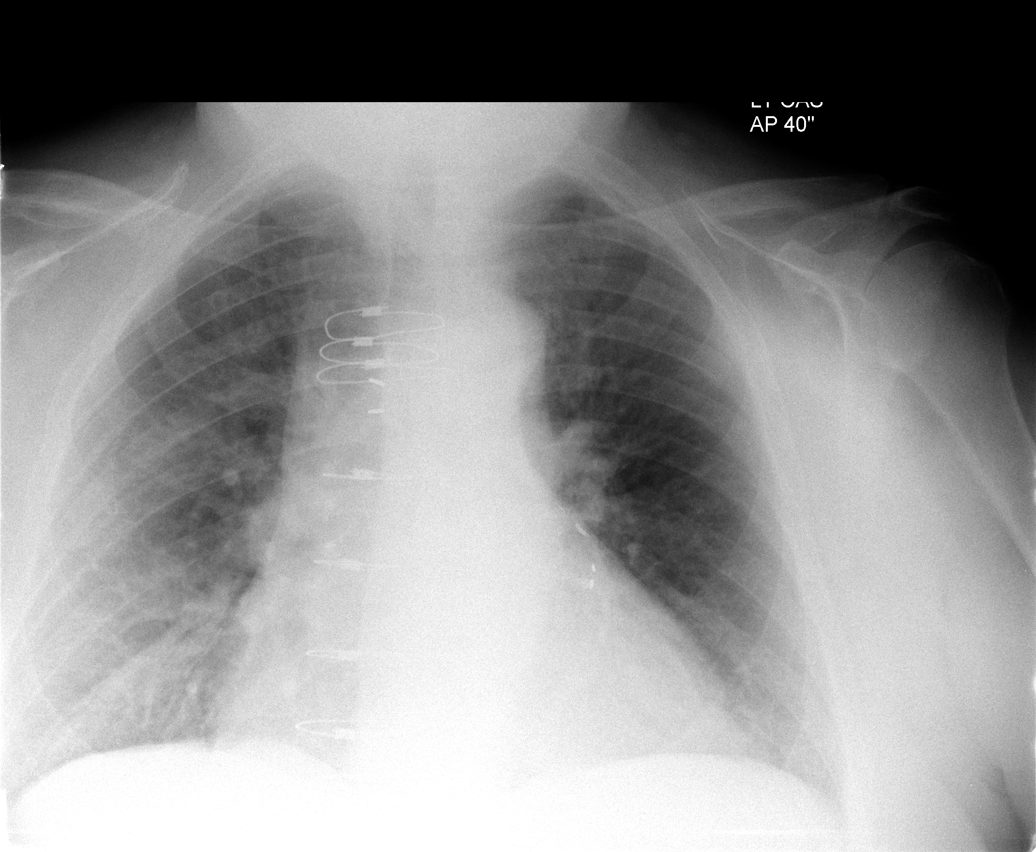

[4 of 4 positions shown; findings below may reference images not displayed]

FINDINGS: Moderate cardiomegaly stable pulmonary hyperinflation
again seen, consistent with COPD.  Previous median sternotomy
noted.  No evidence of pulmonary infiltrate or edema.  No evidence
of pleural effusion.
IMPRESSION: Stable cardiomegaly and COPD.  No acute findings.

## 2010-10-21 IMAGING — CR DG CHEST 1V PORT
1 series · 1 of 1 positions shown · non-contrast
Comparison: 09/27/2009

CLINICAL DATA: PICC placement

PORTABLE CHEST - 1 VIEW

[view not recorded]
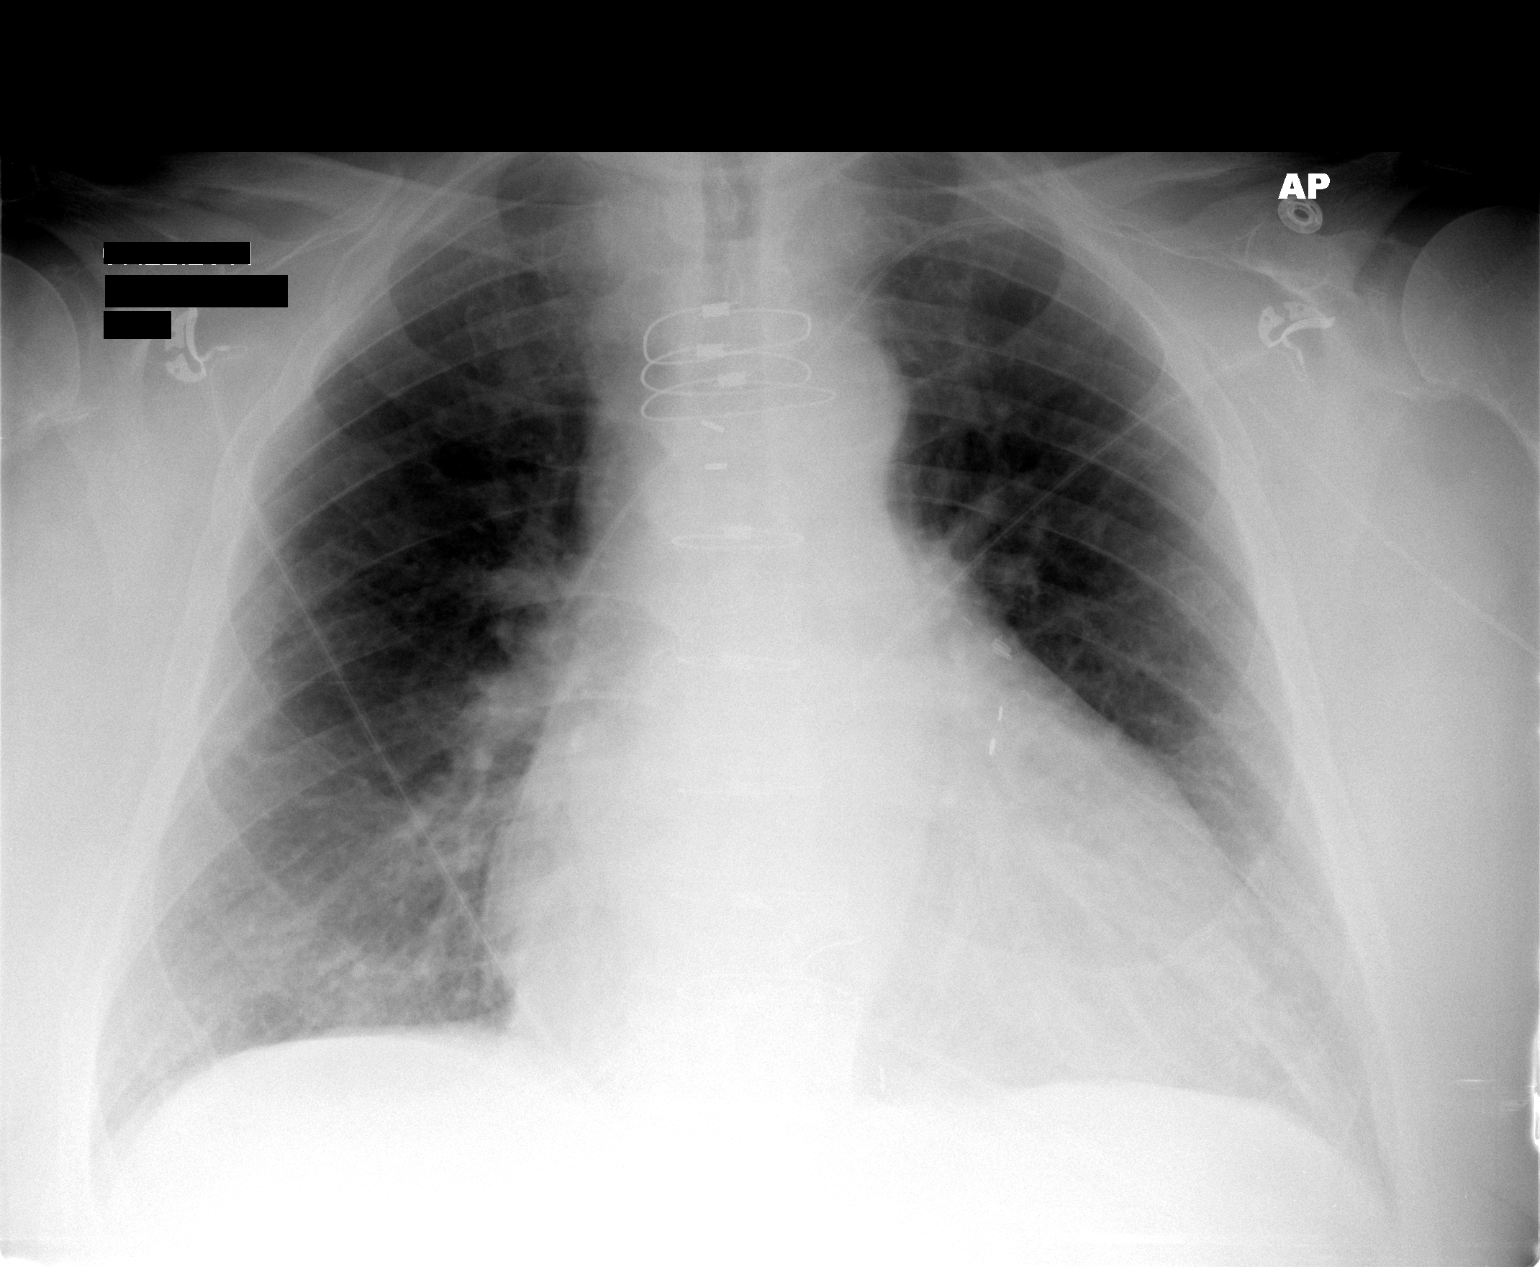

[1 of 1 positions shown; findings below may reference images not displayed]

FINDINGS: Interval placement of a left upper extremity PICC.  The
tip terminates in the mid superior vena cava, in satisfactory
position.  There are changes of median sternotomy for CABG.
Cardiomegaly is stable.  No pulmonary edema, focal airspace
disease, pneumothorax or pleural effusion is identified.
IMPRESSION: 1. Satisfactory position of left upper extremity PICC, with tip in
the mid superior vena cava.  No complicating features.
2.  Stable cardiomegaly.

## 2010-10-25 ENCOUNTER — Encounter (HOSPITAL_BASED_OUTPATIENT_CLINIC_OR_DEPARTMENT_OTHER): Payer: Medicare Other | Attending: Internal Medicine

## 2010-10-25 DIAGNOSIS — I872 Venous insufficiency (chronic) (peripheral): Secondary | ICD-10-CM | POA: Insufficient documentation

## 2010-10-25 DIAGNOSIS — L97409 Non-pressure chronic ulcer of unspecified heel and midfoot with unspecified severity: Secondary | ICD-10-CM | POA: Insufficient documentation

## 2010-10-25 DIAGNOSIS — L97309 Non-pressure chronic ulcer of unspecified ankle with unspecified severity: Secondary | ICD-10-CM | POA: Insufficient documentation

## 2010-10-25 DIAGNOSIS — I4891 Unspecified atrial fibrillation: Secondary | ICD-10-CM | POA: Insufficient documentation

## 2010-10-25 DIAGNOSIS — E119 Type 2 diabetes mellitus without complications: Secondary | ICD-10-CM | POA: Insufficient documentation

## 2010-10-31 ENCOUNTER — Encounter: Payer: Medicare Other | Admitting: *Deleted

## 2010-11-01 ENCOUNTER — Other Ambulatory Visit (HOSPITAL_BASED_OUTPATIENT_CLINIC_OR_DEPARTMENT_OTHER): Payer: Self-pay | Admitting: Internal Medicine

## 2010-11-01 ENCOUNTER — Ambulatory Visit (HOSPITAL_COMMUNITY)
Admission: RE | Admit: 2010-11-01 | Discharge: 2010-11-01 | Disposition: A | Discharge status: Home / Self Care | Payer: Medicare Other | Source: Ambulatory Visit | Attending: Internal Medicine | Admitting: Internal Medicine

## 2010-11-01 ENCOUNTER — Ambulatory Visit (HOSPITAL_COMMUNITY)
Admission: RE | Admit: 2010-11-01 | Discharge: 2010-11-01 | Disposition: A | Discharge status: Home / Self Care | Payer: Medicare Other | Source: Ambulatory Visit | Attending: General Surgery | Admitting: General Surgery

## 2010-11-01 DIAGNOSIS — T8130XA Disruption of wound, unspecified, initial encounter: Secondary | ICD-10-CM

## 2010-11-01 DIAGNOSIS — I2510 Atherosclerotic heart disease of native coronary artery without angina pectoris: Secondary | ICD-10-CM | POA: Insufficient documentation

## 2010-11-01 DIAGNOSIS — E119 Type 2 diabetes mellitus without complications: Secondary | ICD-10-CM | POA: Insufficient documentation

## 2010-11-01 DIAGNOSIS — I509 Heart failure, unspecified: Secondary | ICD-10-CM | POA: Insufficient documentation

## 2010-11-01 DIAGNOSIS — I059 Rheumatic mitral valve disease, unspecified: Secondary | ICD-10-CM

## 2010-11-01 DIAGNOSIS — I2789 Other specified pulmonary heart diseases: Secondary | ICD-10-CM | POA: Insufficient documentation

## 2010-11-01 DIAGNOSIS — I5030 Unspecified diastolic (congestive) heart failure: Secondary | ICD-10-CM | POA: Insufficient documentation

## 2010-11-02 ENCOUNTER — Other Ambulatory Visit (HOSPITAL_BASED_OUTPATIENT_CLINIC_OR_DEPARTMENT_OTHER): Payer: Self-pay | Admitting: Internal Medicine

## 2010-11-02 ENCOUNTER — Ambulatory Visit (HOSPITAL_COMMUNITY)
Admission: RE | Admit: 2010-11-02 | Discharge: 2010-11-02 | Disposition: A | Discharge status: Home / Self Care | Payer: Medicare Other | Source: Ambulatory Visit | Attending: Internal Medicine | Admitting: Internal Medicine

## 2010-11-02 DIAGNOSIS — M899 Disorder of bone, unspecified: Secondary | ICD-10-CM | POA: Insufficient documentation

## 2010-11-02 DIAGNOSIS — M19079 Primary osteoarthritis, unspecified ankle and foot: Secondary | ICD-10-CM | POA: Insufficient documentation

## 2010-11-02 DIAGNOSIS — M25579 Pain in unspecified ankle and joints of unspecified foot: Secondary | ICD-10-CM | POA: Insufficient documentation

## 2010-11-02 DIAGNOSIS — M869 Osteomyelitis, unspecified: Secondary | ICD-10-CM

## 2010-11-02 DIAGNOSIS — E119 Type 2 diabetes mellitus without complications: Secondary | ICD-10-CM | POA: Insufficient documentation

## 2010-11-02 DIAGNOSIS — M79609 Pain in unspecified limb: Secondary | ICD-10-CM | POA: Insufficient documentation

## 2010-11-02 DIAGNOSIS — L97309 Non-pressure chronic ulcer of unspecified ankle with unspecified severity: Secondary | ICD-10-CM | POA: Insufficient documentation

## 2010-11-05 ENCOUNTER — Ambulatory Visit (INDEPENDENT_AMBULATORY_CARE_PROVIDER_SITE_OTHER): Payer: Medicare Other | Admitting: *Deleted

## 2010-11-05 DIAGNOSIS — Z7901 Long term (current) use of anticoagulants: Secondary | ICD-10-CM

## 2010-11-05 DIAGNOSIS — I4891 Unspecified atrial fibrillation: Secondary | ICD-10-CM

## 2010-11-05 DIAGNOSIS — Z8679 Personal history of other diseases of the circulatory system: Secondary | ICD-10-CM

## 2010-11-05 LAB — POCT INR: INR: 1.8

## 2010-11-07 ENCOUNTER — Ambulatory Visit: Payer: Medicare Other | Admitting: Infectious Disease

## 2010-11-09 ENCOUNTER — Other Ambulatory Visit (INDEPENDENT_AMBULATORY_CARE_PROVIDER_SITE_OTHER): Payer: Medicare Other

## 2010-11-09 ENCOUNTER — Ambulatory Visit (INDEPENDENT_AMBULATORY_CARE_PROVIDER_SITE_OTHER): Payer: Medicare Other

## 2010-11-09 DIAGNOSIS — L97409 Non-pressure chronic ulcer of unspecified heel and midfoot with unspecified severity: Secondary | ICD-10-CM

## 2010-11-09 DIAGNOSIS — L97909 Non-pressure chronic ulcer of unspecified part of unspecified lower leg with unspecified severity: Secondary | ICD-10-CM

## 2010-11-16 ENCOUNTER — Encounter (HOSPITAL_BASED_OUTPATIENT_CLINIC_OR_DEPARTMENT_OTHER): Payer: Medicare Other | Attending: Internal Medicine

## 2010-11-16 DIAGNOSIS — E119 Type 2 diabetes mellitus without complications: Secondary | ICD-10-CM | POA: Insufficient documentation

## 2010-11-16 DIAGNOSIS — I4891 Unspecified atrial fibrillation: Secondary | ICD-10-CM | POA: Insufficient documentation

## 2010-11-16 DIAGNOSIS — I872 Venous insufficiency (chronic) (peripheral): Secondary | ICD-10-CM | POA: Insufficient documentation

## 2010-11-16 DIAGNOSIS — L97309 Non-pressure chronic ulcer of unspecified ankle with unspecified severity: Secondary | ICD-10-CM | POA: Insufficient documentation

## 2010-11-16 DIAGNOSIS — L97409 Non-pressure chronic ulcer of unspecified heel and midfoot with unspecified severity: Secondary | ICD-10-CM | POA: Insufficient documentation

## 2010-11-19 ENCOUNTER — Encounter: Payer: Self-pay | Admitting: Infectious Disease

## 2010-11-19 ENCOUNTER — Ambulatory Visit (INDEPENDENT_AMBULATORY_CARE_PROVIDER_SITE_OTHER): Payer: Medicare Other | Admitting: Infectious Disease

## 2010-11-19 VITALS — BP 118/65 | HR 58 | Temp 97.5°F

## 2010-11-19 DIAGNOSIS — B9689 Other specified bacterial agents as the cause of diseases classified elsewhere: Secondary | ICD-10-CM

## 2010-11-19 DIAGNOSIS — L89624 Pressure ulcer of left heel, stage 4: Secondary | ICD-10-CM

## 2010-11-19 DIAGNOSIS — L8994 Pressure ulcer of unspecified site, stage 4: Secondary | ICD-10-CM

## 2010-11-19 DIAGNOSIS — A499 Bacterial infection, unspecified: Secondary | ICD-10-CM

## 2010-11-19 DIAGNOSIS — M009 Pyogenic arthritis, unspecified: Secondary | ICD-10-CM

## 2010-11-19 DIAGNOSIS — L8990 Pressure ulcer of unspecified site, unspecified stage: Secondary | ICD-10-CM

## 2010-11-19 DIAGNOSIS — B999 Unspecified infectious disease: Secondary | ICD-10-CM

## 2010-11-19 DIAGNOSIS — L89609 Pressure ulcer of unspecified heel, unspecified stage: Secondary | ICD-10-CM

## 2010-11-19 MED ORDER — MINOCYCLINE HCL 100 MG PO TABS: 100.0000 mg | ORAL_TABLET | Freq: Two times a day (BID) | ORAL | Status: AC

## 2010-11-19 MED ORDER — MINOCYCLINE HCL 100 MG PO TABS: 100.0000 mg | ORAL_TABLET | Freq: Two times a day (BID) | ORAL | Status: DC

## 2010-11-19 MED ORDER — LEVOFLOXACIN 750 MG PO TABS: 750.0000 mg | ORAL_TABLET | Freq: Every day | ORAL | Status: DC

## 2010-11-19 NOTE — Patient Instructions (Signed)
Please replace the  DOXYCYLINE WITH  MINOCYCLINE 100MG  TWICE DAILY  AND CONTINUE THE LEVAQUIN 750 MG DAILY

## 2010-11-19 NOTE — Assessment & Plan Note (Signed)
While he reportedly not have osteo by plain films my suspicion is fairly high for this. I will change his coverage for his knee to levaquin and doxy to levo and minocycline to better cover the S maltophila (should it be a pathogen here)

## 2010-11-19 NOTE — Assessment & Plan Note (Signed)
Continue levaquin and will exchange doxycyline for minocyline (see below), check ESR, CRP

## 2010-11-19 NOTE — Progress Notes (Signed)
Subjective:    Patient ID: Derek Frye, male    DOB: 11/27/1940, 70 y.o.   MRN: [id number]  HPI  70 yo man who had prior infection of left TKA in 2005 apparently managed by Dr. Roxan Hockey at that time. According to the wife and the pt had infection due to a cat scratch. 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 Roxan Hockey or of isolation of Pasteurella from culture. IN any case he had recurrent infection in his left knee this July 2011after he had bout of cellulitis that originated with ulcers in lower extremity. He was treated by the hospitalist team with broad spectrum antibiotics. Ultimately he underwent I and D with resection arthroplasty and placement of antibiotic spacer on 09/28/09. Intraoperative cultures grew Diphtheroids, though one culture was initially read as CNS and the other as having GNR.He was placed on IV Vancomycin and treated for nearly 12 weeks with IV vancomycin and seen in [place] by Dr. Charlann Boxer who aspirated knee in September 2011 and encountered wbc of 1800 on aspiraate without any organisms growing. I saw him this fall and put him on doxycyline. I did a swab culture of surface exudate from on of his wound which grew proteus but I considered it a skin contaminant. In the interim hedeveloped recurrenc of infection in his knee and uderwent yet again I and D and removal of antibiotic spacer by Dr Charlann Boxer. Cultures now grew a pure GBS. He was changed to rocephintransitioned to doxycycline and levofloxacin which he remains on. He has continued to have a great deal of difficulty healing the open ulcers on his feet  In particular the one on his heel which was cultured by podiatry and which yielded Stenotrophomonas which was R to bactrim, levaquin but s to minocycline. He has been without fevers, chills, nausea or vomiting.  Review of Systems  Constitutional: Negative for fever, chills, diaphoresis, activity change, appetite change, fatigue and  unexpected weight change.  HENT: Negative for congestion, sore throat, rhinorrhea, sneezing, trouble swallowing and sinus pressure.   Eyes: Negative for photophobia and visual disturbance.  Respiratory: Negative for cough, chest tightness, shortness of breath, wheezing and stridor.   Cardiovascular: Positive for leg swelling. Negative for chest pain and palpitations.  Gastrointestinal: Negative for nausea, vomiting, abdominal pain, diarrhea, constipation, blood in stool, abdominal distention and anal bleeding.  Genitourinary: Negative for dysuria, hematuria, flank pain and difficulty urinating.  Musculoskeletal: Negative for myalgias, back pain, joint swelling, arthralgias and gait problem.  Skin: Positive for color change and wound. Negative for pallor and rash.  Neurological: Negative for dizziness, tremors, weakness and light-headedness.  Hematological: Negative for adenopathy. Does not bruise/bleed easily.  Psychiatric/Behavioral: Negative for behavioral problems, confusion, sleep disturbance, dysphoric mood, decreased concentration and agitation.       Objective:   Physical Exam  Constitutional: He is oriented to person, place, and time. He appears well-developed and well-nourished. No distress.  HENT:  Head: Normocephalic and atraumatic.  Mouth/Throat: Oropharynx is clear and moist. No oropharyngeal exudate.  Eyes: Conjunctivae and EOM are normal. Pupils are equal, round, and reactive to light. No scleral icterus.  Neck: Normal range of motion. Neck supple. No JVD present.  Cardiovascular: Normal rate, regular rhythm and normal heart sounds.  Exam reveals no gallop and no friction rub.   No murmur heard. Pulmonary/Chest: Effort normal and breath sounds normal. No respiratory distress. He has no wheezes. He has no rales. He exhibits no tenderness.  Abdominal:  He exhibits no distension and no mass. There is no tenderness. There is no rebound and no guarding.  Musculoskeletal: He  exhibits edema. He exhibits no tenderness.       Legs:      Feet:       Clear drainage from the heel ulcer  Surgical site is CDI and wihtout fluctuance  Lymphadenopathy:    He has no cervical adenopathy.  Neurological: He is alert and oriented to person, place, and time. He has normal reflexes. He exhibits normal muscle tone. Coordination normal.  Skin: Skin is warm and dry. He is not diaphoretic. There is erythema. No pallor.          Dark hyperpigmented skin  Psychiatric: He has a normal mood and affect. His behavior is normal. Judgment and thought content normal.          Assessment & Plan:

## 2010-11-19 NOTE — Assessment & Plan Note (Signed)
See above will chose to cover the S maltophil while this is not clearly a pathogen

## 2010-11-20 LAB — CBC WITH DIFFERENTIAL/PLATELET
Basophils Absolute: 0 10*3/uL (ref 0.0–0.1)
Basophils Relative: 1 % (ref 0–1)
Eosinophils Absolute: 0.4 10*3/uL (ref 0.0–0.7)
Eosinophils Relative: 4 % (ref 0–5)
HCT: 39.7 % (ref 39.0–52.0)
Hemoglobin: 12.8 g/dL — ABNORMAL LOW (ref 13.0–17.0)
MCH: 29.6 pg (ref 26.0–34.0)
MCHC: 32.2 g/dL (ref 30.0–36.0)
Monocytes Absolute: 1.1 10*3/uL — ABNORMAL HIGH (ref 0.1–1.0)
Monocytes Relative: 13 % — ABNORMAL HIGH (ref 3–12)
Neutro Abs: 4.9 10*3/uL (ref 1.7–7.7)
RDW: 16.2 % — ABNORMAL HIGH (ref 11.5–15.5)

## 2010-11-20 LAB — BASIC METABOLIC PANEL
BUN: 23 mg/dL (ref 6–23)
Calcium: 9 mg/dL (ref 8.4–10.5)
Creat: 0.85 mg/dL (ref 0.50–1.35)
Glucose, Bld: 106 mg/dL — ABNORMAL HIGH (ref 70–99)
Potassium: 3.5 mEq/L (ref 3.5–5.3)

## 2010-11-20 LAB — C-REACTIVE PROTEIN: CRP: 2.04 mg/dL — ABNORMAL HIGH (ref <0.60)

## 2010-11-20 LAB — SEDIMENTATION RATE: Sed Rate: 96 mm/hr — ABNORMAL HIGH (ref 0–16)

## 2010-11-26 ENCOUNTER — Ambulatory Visit (INDEPENDENT_AMBULATORY_CARE_PROVIDER_SITE_OTHER): Payer: Medicare Other | Admitting: *Deleted

## 2010-11-26 DIAGNOSIS — Z8679 Personal history of other diseases of the circulatory system: Secondary | ICD-10-CM

## 2010-11-26 DIAGNOSIS — Z7901 Long term (current) use of anticoagulants: Secondary | ICD-10-CM

## 2010-11-26 DIAGNOSIS — I4891 Unspecified atrial fibrillation: Secondary | ICD-10-CM

## 2010-11-26 LAB — WOUND CULTURE

## 2010-11-26 LAB — POCT INR: INR: 2.7

## 2010-11-26 MED ORDER — WARFARIN SODIUM 2.5 MG PO TABS: ORAL_TABLET | ORAL | Status: DC

## 2010-11-26 NOTE — Procedures (Unsigned)
DUPLEX DEEP VENOUS EXAM - LOWER EXTREMITY  INDICATION:  Left heel ulcer for 18 months.  HISTORY:  Edema:  Yes. Trauma/Surgery:  Knee surgery. Pain:  Yes. PE:  No. Previous DVT:  Unknown. Anticoagulants: Other:  DUPLEX EXAM:               CFV   SFV   PopV  PTV    GSV               R  L  R  L  R  L  R   L  R  L Thrombosis    o  o  o  o  o  + Spontaneous   +  +  +  +  +  + Phasic        +  +  +  +  +  + Augmentation Compressible  +  +  +  +  +  p Competent  Legend:  + - yes  o - no  p - partial  D - decreased   IMPRESSION: 1. 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 2. Evaluation of competency was unsuccessful due to pulsatile venous     flow. 3. Bilateral calf veins could not be visualized. 4. Bilateral greater saphenous vein has been surgically harvested.         _____________________________ V. Charlena Cross, MD  LT/MEDQ  D:  11/09/2010  T:  11/09/2010  Job:  [id number]

## 2010-12-14 ENCOUNTER — Encounter (HOSPITAL_BASED_OUTPATIENT_CLINIC_OR_DEPARTMENT_OTHER): Payer: Medicare Other | Attending: General Surgery

## 2010-12-14 DIAGNOSIS — I252 Old myocardial infarction: Secondary | ICD-10-CM | POA: Insufficient documentation

## 2010-12-14 DIAGNOSIS — I4891 Unspecified atrial fibrillation: Secondary | ICD-10-CM | POA: Insufficient documentation

## 2010-12-14 DIAGNOSIS — C61 Malignant neoplasm of prostate: Secondary | ICD-10-CM | POA: Insufficient documentation

## 2010-12-14 DIAGNOSIS — L97409 Non-pressure chronic ulcer of unspecified heel and midfoot with unspecified severity: Secondary | ICD-10-CM | POA: Insufficient documentation

## 2010-12-14 DIAGNOSIS — E1142 Type 2 diabetes mellitus with diabetic polyneuropathy: Secondary | ICD-10-CM | POA: Insufficient documentation

## 2010-12-14 DIAGNOSIS — I714 Abdominal aortic aneurysm, without rupture, unspecified: Secondary | ICD-10-CM | POA: Insufficient documentation

## 2010-12-14 DIAGNOSIS — L97509 Non-pressure chronic ulcer of other part of unspecified foot with unspecified severity: Secondary | ICD-10-CM | POA: Insufficient documentation

## 2010-12-14 DIAGNOSIS — I872 Venous insufficiency (chronic) (peripheral): Secondary | ICD-10-CM | POA: Insufficient documentation

## 2010-12-14 DIAGNOSIS — Z7901 Long term (current) use of anticoagulants: Secondary | ICD-10-CM | POA: Insufficient documentation

## 2010-12-14 DIAGNOSIS — L97309 Non-pressure chronic ulcer of unspecified ankle with unspecified severity: Secondary | ICD-10-CM | POA: Insufficient documentation

## 2010-12-14 DIAGNOSIS — Z79899 Other long term (current) drug therapy: Secondary | ICD-10-CM | POA: Insufficient documentation

## 2010-12-14 DIAGNOSIS — E1149 Type 2 diabetes mellitus with other diabetic neurological complication: Secondary | ICD-10-CM | POA: Insufficient documentation

## 2010-12-14 DIAGNOSIS — I739 Peripheral vascular disease, unspecified: Secondary | ICD-10-CM | POA: Insufficient documentation

## 2010-12-19 ENCOUNTER — Encounter: Payer: Self-pay | Admitting: Infectious Disease

## 2010-12-19 ENCOUNTER — Ambulatory Visit (INDEPENDENT_AMBULATORY_CARE_PROVIDER_SITE_OTHER): Payer: Medicare Other | Admitting: Infectious Disease

## 2010-12-19 VITALS — BP 93/63 | HR 103 | Temp 97.6°F

## 2010-12-19 DIAGNOSIS — M009 Pyogenic arthritis, unspecified: Secondary | ICD-10-CM

## 2010-12-19 DIAGNOSIS — L8994 Pressure ulcer of unspecified site, stage 4: Secondary | ICD-10-CM

## 2010-12-19 DIAGNOSIS — L8990 Pressure ulcer of unspecified site, unspecified stage: Secondary | ICD-10-CM

## 2010-12-19 DIAGNOSIS — L89624 Pressure ulcer of left heel, stage 4: Secondary | ICD-10-CM

## 2010-12-19 DIAGNOSIS — L89609 Pressure ulcer of unspecified heel, unspecified stage: Secondary | ICD-10-CM

## 2010-12-19 NOTE — Patient Instructions (Signed)
Please page on call ID MD if drainage worsens Good luck with surgery Schedule followup after your amputation

## 2010-12-19 NOTE — Assessment & Plan Note (Signed)
I agree with AKA. If pt has evidence of worsening systemic symptoms of infection or worsening drainage from his foot he is to call and contact me will admit him to hospital stabilize him with intravenous antibiotics prior to his above-the-knee of amputation

## 2010-12-19 NOTE — Assessment & Plan Note (Signed)
These areas continue to heal very poorly and again I agree with the above-the-knee of dictation as planned. 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

## 2010-12-19 NOTE — Progress Notes (Signed)
Subjective:    Patient ID: Derek Frye, male    DOB: [birth year], 70 y.o.   MRN: [id number]  HPI  70 yo man who had prior infection of left TKA in 2005 apparently managed by Dr. Roxan Hockey at that time. According to the wife and the pt had infection due to a cat scratch. 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 Roxan Hockey or of isolation of Pasteurella from culture. IN any case he had recurrent infection in his left knee this July 2011after he had bout of cellulitis that originated with ulcers in lower extremity. He was treated by the hospitalist team with broad spectrum antibiotics. Ultimately he underwent I and D with resection arthroplasty and placement of antibiotic spacer on 09/28/09. Intraoperative cultures grew Diphtheroids, though one culture was initially read as CNS and the other as having GNR.He was placed on IV Vancomycin and treated for nearly 12 weeks with IV vancomycin and seen in [place] by Dr. Charlann Boxer who aspirated knee in September 2011 and encountered wbc of 1800 on aspiraate without any organisms growing. I saw him this fall and put him on doxycyline. I did a swab culture of surface exudate from on of his wound which grew proteus but I considered it a skin contaminant. In the interim hedeveloped recurrenc of infection in his knee and uderwent yet again I and D and removal of antibiotic spacer by Dr Charlann Boxer. Cultures now grew a pure GBS. He was changed to rocephintransitioned to doxycycline and levofloxacin which he remains on until I saw him the last time. Marland Kitchen He has continued to have a great deal of difficulty healing the open ulcers on his feet In particular the one on his heel which was cultured by podiatry and which yielded Stenotrophomonas which was R to bactrim, levaquin but s to minocycline. I cultured material from one of these lesions and it again yielded S. Maltophila that was now I to minocycline but R to all other antibiotics tested. Patient  has since been seen by Dr. Charlann Boxer who believes the patient should receive an above-the-knee of amputation to finally control and cure this infection. This certainly seems quite reasonable to me the patient has surgery scheduled for October 23. An intermediate or increasing drainage from wounds on his left foot in particular and some purulent material was swabbed by me in the clinic again and sent for culture. I'm fine with the patient remaining on minocycline and levofloxacin until his above-the-knee amputation. I am worried that the culprit infection he currently has toe did become less well controlled. I've encouraged him and his wife to contact me should the drainage worsen her sure she should he show any other evidence of severe systemic infection. Neck a sonogram to the hospital and treat him with broad-spectrum antibiotics to stabilize his leg prior to his above-the-knee of amputation   Review of Systems  Constitutional: Negative for fever, chills, diaphoresis, activity change, appetite change, fatigue and unexpected weight change.  HENT: Negative for congestion, sore throat, rhinorrhea, sneezing, trouble swallowing and sinus pressure.   Eyes: Negative for photophobia and visual disturbance.  Respiratory: Negative for cough, chest tightness, shortness of breath, wheezing and stridor.   Cardiovascular: Negative for chest pain, palpitations and leg swelling.  Gastrointestinal: Negative for nausea, vomiting, abdominal pain, diarrhea, constipation, blood in stool, abdominal distention and anal bleeding.  Genitourinary: Negative for dysuria, hematuria, flank pain and difficulty urinating.  Musculoskeletal: Negative for myalgias, back pain, joint swelling, arthralgias  and gait problem.  Skin: Positive for color change, rash and wound. Negative for pallor.  Neurological: Negative for dizziness, tremors, weakness and light-headedness.  Hematological: Negative for adenopathy. Does not bruise/bleed easily.    Psychiatric/Behavioral: Negative for behavioral problems, confusion, sleep disturbance, dysphoric mood, decreased concentration and agitation.       Objective:   Physical Exam  Constitutional: He is oriented to person, place, and time. He appears well-developed. No distress.  HENT:  Head: Normocephalic and atraumatic.  Mouth/Throat: Oropharynx is clear and moist. No oropharyngeal exudate.  Eyes: Conjunctivae and EOM are normal. Pupils are equal, round, and reactive to light. No scleral icterus.  Neck: Normal range of motion. Neck supple. No JVD present.  Cardiovascular: Normal rate, regular rhythm and normal heart sounds.  Exam reveals no gallop and no friction rub.   No murmur heard. Pulmonary/Chest: Effort normal and breath sounds normal. No respiratory distress. He has no wheezes. He has no rales. He exhibits no tenderness.  Abdominal: He exhibits no distension and no mass. There is no tenderness. There is no rebound and no guarding.  Musculoskeletal: He exhibits no edema and no tenderness.       Feet:  Lymphadenopathy:    He has no cervical adenopathy.  Neurological: He is alert and oriented to person, place, and time. He has normal reflexes. He exhibits normal muscle tone. Coordination normal.  Skin: Rash noted. He is not diaphoretic. No erythema. There is pallor.  Psychiatric: He has a normal mood and affect. His behavior is normal. Judgment and thought content normal.          Assessment & Plan:  Pyogenic arthritis, lower leg I agree with AKA. 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

## 2010-12-20 ENCOUNTER — Encounter: Payer: Self-pay | Admitting: Surgery

## 2010-12-21 LAB — WOUND CULTURE: Gram Stain: NONE SEEN

## 2010-12-24 ENCOUNTER — Encounter: Payer: Medicare Other | Admitting: *Deleted

## 2010-12-25 ENCOUNTER — Other Ambulatory Visit: Payer: Self-pay | Admitting: Cardiology

## 2010-12-25 DIAGNOSIS — I6529 Occlusion and stenosis of unspecified carotid artery: Secondary | ICD-10-CM

## 2010-12-26 ENCOUNTER — Encounter (INDEPENDENT_AMBULATORY_CARE_PROVIDER_SITE_OTHER): Payer: Medicare Other | Admitting: Cardiology

## 2010-12-26 ENCOUNTER — Ambulatory Visit (INDEPENDENT_AMBULATORY_CARE_PROVIDER_SITE_OTHER): Payer: Medicare Other | Admitting: *Deleted

## 2010-12-26 ENCOUNTER — Ambulatory Visit (INDEPENDENT_AMBULATORY_CARE_PROVIDER_SITE_OTHER): Payer: Medicare Other | Admitting: Cardiology

## 2010-12-26 ENCOUNTER — Encounter: Payer: Self-pay | Admitting: Cardiology

## 2010-12-26 DIAGNOSIS — I10 Essential (primary) hypertension: Secondary | ICD-10-CM

## 2010-12-26 DIAGNOSIS — I4891 Unspecified atrial fibrillation: Secondary | ICD-10-CM

## 2010-12-26 DIAGNOSIS — R9430 Abnormal result of cardiovascular function study, unspecified: Secondary | ICD-10-CM | POA: Insufficient documentation

## 2010-12-26 DIAGNOSIS — I2510 Atherosclerotic heart disease of native coronary artery without angina pectoris: Secondary | ICD-10-CM

## 2010-12-26 DIAGNOSIS — I6529 Occlusion and stenosis of unspecified carotid artery: Secondary | ICD-10-CM

## 2010-12-26 DIAGNOSIS — I714 Abdominal aortic aneurysm, without rupture: Secondary | ICD-10-CM

## 2010-12-26 DIAGNOSIS — E8779 Other fluid overload: Secondary | ICD-10-CM

## 2010-12-26 DIAGNOSIS — I779 Disorder of arteries and arterioles, unspecified: Secondary | ICD-10-CM

## 2010-12-26 DIAGNOSIS — Z01810 Encounter for preprocedural cardiovascular examination: Secondary | ICD-10-CM

## 2010-12-26 DIAGNOSIS — E8770 Fluid overload, unspecified: Secondary | ICD-10-CM

## 2010-12-26 DIAGNOSIS — Z7901 Long term (current) use of anticoagulants: Secondary | ICD-10-CM

## 2010-12-26 DIAGNOSIS — Z8679 Personal history of other diseases of the circulatory system: Secondary | ICD-10-CM

## 2010-12-26 NOTE — Assessment & Plan Note (Signed)
Blood pressure is controlled. No change in therapy. 

## 2010-12-26 NOTE — Assessment & Plan Note (Signed)
The patient is stable from the cardiovascular viewpoint.  We know that his ejection fraction has been stable at 45 for many years.  This was documented again in August, 2012.  He's not had any chest pain.  His last stress study was just within the past 5 years.  It is my feeling that it is most prudent to proceed with the patient's amputation.  Based on all factors I feel that it is not necessary to proceed with any further cardiac workup at this time.  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

## 2010-12-26 NOTE — Assessment & Plan Note (Signed)
Patient continues on Coumadin.  He will be held for his left AKA

## 2010-12-26 NOTE — Patient Instructions (Signed)
Your physician wants you to follow-up in: 6 MONTHS WITH DR KATZ  You will receive a reminder letter in the mail two months in advance. If you don't receive a letter, please call our office to schedule the follow-up appointment. Your physician recommends that you continue on your current medications as directed. Please refer to the Current Medication list given to you today. 

## 2010-12-26 NOTE — Assessment & Plan Note (Signed)
When I see him next we will need to consider the timing of the next ultrasound of his abdomen.

## 2010-12-26 NOTE — Progress Notes (Signed)
HPI The patient is seen for followup coronary artery disease.  He is also seen for clearance to have a left AKA surgery next week.  He has had a very prolonged course with infection in his left leg.  The decision has been made to proceed with amputation.  He has not had any chest pain.  He's not had any signs of heart failure.  I cannot assess his exercise level because he has been wheelchair-bound with his leg problem.   Allergies  Allergen Reactions  . Amoxicillin     REACTION: swelling and a rash  . Morphine     Current Outpatient Prescriptions  Medication Sig Dispense Refill  . aspirin 81 MG tablet Take 81 mg by mouth daily.        . clonazePAM (KLONOPIN) 1 MG tablet Take 1 mg by mouth as needed.        . cyclobenzaprine (FLEXERIL) 5 MG tablet Take 5 mg by mouth 3 (three) times daily as needed.        . docusate sodium (COLACE) 100 MG capsule Take 100 mg by mouth 2 (two) times daily as needed.        . ENSURE (ENSURE) Take 237 mLs by mouth.        . furosemide (LASIX) 40 MG tablet Take one (1) tablet by mouth daily.       Marland Kitchen levofloxacin (LEVAQUIN) 750 MG tablet Take 1 tablet (750 mg total) by mouth daily. TAKE 1 TABLET DAILY  90 tablet  4  . magnesium gluconate (MAGONATE) 500 MG tablet Take 500 mg by mouth daily.        . metFORMIN (GLUCOPHAGE) 850 MG tablet Take 850 mg by mouth daily with breakfast.        . metolazone (ZAROXOLYN) 2.5 MG tablet Take 2.5 mg by mouth daily.        . minocycline (MINOCIN,DYNACIN) 100 MG capsule Take 1 tablet by mouth Twice daily.      Marland Kitchen oxycodone (OXY-IR) 5 MG capsule Take 10 mg by mouth every 4 (four) hours as needed.       . potassium chloride SA (K-DUR,KLOR-CON) 20 MEQ tablet Take 20 mEq by mouth 2 (two) times daily.        . simvastatin (ZOCOR) 40 MG tablet TAKE 1 TABLET AT BEDTIME  90 tablet  2  . spironolactone (ALDACTONE) 25 MG tablet Take one (1) tablet by mouth two (2) times a day.       . warfarin (COUMADIN) 2.5 MG tablet Take as directed by  [facility].  120 tablet  0    History   Social History  . Marital Status: Married    Spouse Name: N/A    Number of Children: N/A  . Years of Education: N/A   Occupational History  . Not on file.   Social History Main Topics  . Smoking status: Current Some Day Smoker -- 0.5 packs/day for 30 years    Types: Cigars, Cigarettes  . Smokeless tobacco: Not on file  . Alcohol Use: Yes     occasionally  . Drug Use: Not on file  . Sexually Active: Not on file   Other Topics Concern  . Not on file   Social History Narrative  . No narrative on file    Family History  Problem Relation Age of Onset  . Stroke Father 65  . Diabetes Other     Past Medical History  Diagnosis Date  . HTN (hypertension)   .  Obesity   . History of cholecystectomy   . CAD (coronary artery disease)     Status post CABG followed by redo in 1994 / nuclear 2007, old infarct, no ischemia  . Ejection fraction < 50%     EF  45%...echo 2004 /  EF 45%, echo, August, 2012  . Hyperlipidemia   . Cigarette smoker     He is now smoking cigars and needs to quit.  . 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 no replacement until leg is stable  . Prostate cancer     History of prostate cancer with a seed implant and this is stable.  . Abdominal aortic aneurysm     is stable, being followed  . Atrial fibrillation     Rate control / going in and out of atrial fib, Coumadin  . Volume overload   . Right bundle branch block     noted November 09, 2008.  . Warfarin anticoagulation     Atrial fibrillation  . Carotid artery disease     Doppler September 2 009, 40-50% LICA / Doppler October, 2010, 0-39% R. ICA 40-59% LICA  . Diabetes mellitus   . Myocardial infarction 1984  . Ulcer   . Peripheral vascular disease     Past Surgical History  Procedure Date  . Total knee arthroplasty   . Coronary artery bypass graft 1994    x2  . Anal fissure repair   . Pr vein bypass  graft,aorto-fem-pop     ROS  Patient denies fever, chills, headache, sweats, rash, change in vision, change in hearing, chest pain, cough, nausea vomiting, urinary symptoms.  All other systems are reviewed and are negative.  PHYSICAL EXAM Patient is in a wheelchair today.  He is stable.  He is here with a family member.  He is oriented to person time and place.  Affect is normal.  There is no jugular venous distention.  Lungs are clear.  Respiratory effort is nonlabored.  Cardiac exam reveals S1 and S2.  There are no clicks or significant murmurs.  Abdomen is soft.  He has no edema in the right leg.  His left leg is significantly abnormal related to the infection below his knee.  He Filed Vitals:   12/26/10 1308  BP: 114/62  Pulse: 61  Height: 5\' 10"  (1.778 m)  Weight: 209 lb (94.802 kg)    EKG is done today and reviewed by me.  He has old right bundle branch block and old atrial fibrillation with a controlled rate.  ASSESSMENT & PLAN

## 2010-12-26 NOTE — Assessment & Plan Note (Signed)
His volume status is stable.  He's had no recent CHF.

## 2010-12-26 NOTE — Assessment & Plan Note (Signed)
His carotid Dopplers were checked today.  These are stable and can be followed up in one year.

## 2010-12-26 NOTE — Assessment & Plan Note (Signed)
Coronary disease is stable.  He's had no symptoms.  His last stress study was within approximately 5 years.

## 2010-12-27 ENCOUNTER — Telehealth: Payer: Self-pay | Admitting: Cardiology

## 2010-12-27 NOTE — Telephone Encounter (Signed)
All Cardiac faxed to [facility] @ [phone]  12/27/10/km

## 2010-12-28 ENCOUNTER — Other Ambulatory Visit: Payer: Self-pay | Admitting: Orthopedic Surgery

## 2010-12-28 ENCOUNTER — Encounter (HOSPITAL_COMMUNITY): Payer: Medicare Other

## 2010-12-28 LAB — CBC
Hemoglobin: 12.1 g/dL — ABNORMAL LOW (ref 13.0–17.0)
MCH: 28.6 pg (ref 26.0–34.0)
Platelets: 182 10*3/uL (ref 150–400)
RBC: 4.23 MIL/uL (ref 4.22–5.81)
WBC: 8.8 10*3/uL (ref 4.0–10.5)

## 2010-12-28 LAB — BASIC METABOLIC PANEL
CO2: 30 mEq/L (ref 19–32)
Calcium: 9.4 mg/dL (ref 8.4–10.5)
Creatinine, Ser: 0.85 mg/dL (ref 0.50–1.35)
GFR calc Af Amer: >90 mL/min (ref >90)
GFR calc non Af Amer: 85 mL/min — ABNORMAL LOW (ref >90)
Sodium: 136 mEq/L (ref 135–145)

## 2010-12-28 LAB — URINALYSIS, ROUTINE W REFLEX MICROSCOPIC
Bilirubin Urine: NEGATIVE
Hgb urine dipstick: NEGATIVE
Specific Gravity, Urine: 1.024 (ref 1.005–1.030)
Urobilinogen, UA: 0.2 mg/dL (ref 0.0–1.0)
pH: 5.5 (ref 5.0–8.0)

## 2010-12-28 LAB — DIFFERENTIAL
Basophils Absolute: 0 10*3/uL (ref 0.0–0.1)
Basophils Relative: 0 % (ref 0–1)
Eosinophils Absolute: 0.5 10*3/uL (ref 0.0–0.7)
Neutro Abs: 5.7 10*3/uL (ref 1.7–7.7)
Neutrophils Relative %: 65 % (ref 43–77)

## 2010-12-28 LAB — PROTIME-INR
INR: 2.32 — ABNORMAL HIGH (ref 0.00–1.49)
Prothrombin Time: 25.9 seconds — ABNORMAL HIGH (ref 11.6–15.2)

## 2010-12-28 LAB — APTT: aPTT: 94 seconds — ABNORMAL HIGH (ref 24–37)

## 2010-12-28 LAB — SURGICAL PCR SCREEN: MRSA, PCR: NEGATIVE

## 2010-12-31 NOTE — H&P (Signed)
NAME:  Derek Frye, Derek Frye NO.:  [id number]  MEDICAL RECORD NO.:  [id number]  LOCATION:  [facility]M                         FACILITY:  [facility]  PHYSICIAN:  Madlyn Frankel. Charlann Boxer, M.D.  DATE OF BIRTH:  Mar 26, 1940  DATE OF ADMISSION:  12/28/2010 DATE OF DISCHARGE:                             HISTORY & PHYSICAL   Date of surgery:  January 01, 2011.  CHIEF COMPLAINT:  Left lower leg chronic osteomyelitis.  HISTORY OF PRESENT ILLNESS:  The patient is a 70 year old, white male, in no acute distress.  The patient has had a total knee arthroplasty on the left knee many years ago.  Did well with this for a while.  Even he did have an infection with revision and he did quite well with this for many years.  In June 2011, he started having infection/cellulitis of the left lower extremity.  He is being treated with this for unknown amount of time by the primary care physician with oral antibiotics.  The infection eventually spread to the knee.  Since that time, the patienthas had multiple surgeries on the left knee including the removal of prosthetics and application of antibiotics, spacers, and subsequent revisions.  This was carried out 3 times each.  The patient wishes to proceed with an above-the-knee amputation on the left leg for definitive treatment of chronic osteomyelitis of the left knee and lower leg.  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  Options were discussed with the patient.  He wishes to proceed with surgery.  Risks, benefits, and expectations of the procedure have been explained.  The patient understood and wishes to proceed with a left above-the-knee amputation by Dr. Charlann Boxer at [facility] on January 01, 2011.  PAST MEDICAL HISTORY: 1. Hypertension (no weight control, no longer medication). 2. Heart disease/coronary disease. 3. Peripheral vascular disease. 4. Hypercholesterolemia. 5. AFib. 6. Heart disease. 7.  Gallbladder disease. 8. Prostate disease. 9. Non-insulin diabetes. 10.Multiple staph and strep infections with cellulitis of the left     lower leg.  PREVIOUS SURGERIES:  CABG x2 and about five surgeries on the left knee.  SOCIAL HISTORY:  The patient does admit to smoking 1 pack per day of cigarettes.  The patient denies the use of alcohol.  REVIEW OF SYSTEMS:  General: Weight loss.  HEENT:  Dentures partial. RESPIRATORY:  Cough.  MUSCULOSKELETAL:  Joint pain.  PRIMARY CARE PHYSICIAN:  Dr. Henri Medal.  MEDICATIONS: 1. Aspirin 81 mg one p.o. daily (stop 5 days prior to surgery). 2. Klonopin 1 mg p.o. p.r.n. up to 3 a day. 3. Flexeril 5 mg one p.o. t.i.d. as needed. 4. Colace 100 mg one p.o. b.i.d. p.r.n. 5. Ensure 237 mL by mouth, 2 bowls a day. 6. Lasix 40 mg one p.o. daily. 7. Levaquin 750 mg one p.o. daily. 8. Magnesium gluconate 500 mg p.o. daily. 9. Metformin 850 mg p.o. daily. 10.Metolazone 2.5 mg one p.o. daily. 11.Minocycline one p.o. b.i.d. 12.Oxycodone 5 mg 1-2 p.o. q.4 hours p.r.n. 13.Potassium chloride 20 mEq p.o. b.i.d. 14.Zocor 40 mg 1 p.o. at bedtime. 15.Spironolactone 1 p.o. b.i.d.  ALLERGIES:  MORPHINE and AMOXICILLIN.  PHYSICAL EXAMINATION:  GENERAL:  The patient is a 70 year old white male, in no acute distress. VITAL SIGNS:  Stable.  Blood pressure is 130/78, respirations 16, and pulse 92. HEENT:  Pupils equal, round, react well to light and accommodation. Throat is clear.  NECK:  Supple.  No JVD noted.  No carotid bruits.  No lymphadenopathy. Cardia:  2/6 systolic murmur present. RESPIRATIONS:  Slight wheezing. NEURO:  Person alert and oriented x3. ORTHO:  Pertaining to the left knee and lower leg.  Hypertrophy of the left knee.  The patient does have swelling significant on the left lower extremity with areas of weeping with severe venous stasis and discoloration of the left lower leg.  The patient complains of constant pain in the left  lower leg.  IMPRESSION:  Chronic osteomyelitis, left lower leg.  PLAN:  The patient admitted to the hospital to undergo an above-the-knee amputation per Dr. Charlann Boxer at [facility] on January 01, 2011. Risks, benefits, and expectations of procedure were discussed with the patient.  The patient understands risks, benefits, and expectations and wished to proceed with surgery.    ______________________________ Lanney Gins, PA   ______________________________ Madlyn Frankel. Charlann Boxer, M.D.   MB/MEDQ  D:  12/28/2010  T:  12/28/2010  Job:  [id number]  Electronically Signed by Lanney Gins PA on 12/31/2010 09:20:28 AM Electronically Signed by Durene Romans M.D. on 12/31/2010 12:40:18 PM

## 2011-01-01 ENCOUNTER — Inpatient Hospital Stay (HOSPITAL_COMMUNITY)
Admission: RE | Admit: 2011-01-01 | Discharge: 2011-01-04 | DRG: 475 | Disposition: A | Discharge status: Rehabilitation Facility | Payer: Medicare Other | Source: Ambulatory Visit | Attending: Orthopedic Surgery | Admitting: Orthopedic Surgery

## 2011-01-01 ENCOUNTER — Other Ambulatory Visit: Payer: Self-pay | Admitting: Orthopedic Surgery

## 2011-01-01 DIAGNOSIS — E7800 Pure hypercholesterolemia, unspecified: Secondary | ICD-10-CM | POA: Diagnosis present

## 2011-01-01 DIAGNOSIS — E119 Type 2 diabetes mellitus without complications: Secondary | ICD-10-CM | POA: Diagnosis present

## 2011-01-01 DIAGNOSIS — I2510 Atherosclerotic heart disease of native coronary artery without angina pectoris: Secondary | ICD-10-CM | POA: Diagnosis present

## 2011-01-01 DIAGNOSIS — D62 Acute posthemorrhagic anemia: Secondary | ICD-10-CM | POA: Diagnosis not present

## 2011-01-01 DIAGNOSIS — I10 Essential (primary) hypertension: Secondary | ICD-10-CM | POA: Diagnosis present

## 2011-01-01 DIAGNOSIS — M86669 Other chronic osteomyelitis, unspecified tibia and fibula: Principal | ICD-10-CM | POA: Diagnosis present

## 2011-01-01 DIAGNOSIS — Z01812 Encounter for preprocedural laboratory examination: Secondary | ICD-10-CM

## 2011-01-01 DIAGNOSIS — I4891 Unspecified atrial fibrillation: Secondary | ICD-10-CM | POA: Diagnosis present

## 2011-01-01 HISTORY — PX: LEG AMPUTATION ABOVE KNEE: SHX117

## 2011-01-01 LAB — PROTIME-INR: Prothrombin Time: 16.8 seconds — ABNORMAL HIGH (ref 11.6–15.2)

## 2011-01-01 LAB — GLUCOSE, CAPILLARY: Glucose-Capillary: 93 mg/dL (ref 70–99)

## 2011-01-02 LAB — BASIC METABOLIC PANEL
BUN: 18 mg/dL (ref 6–23)
CO2: 32 mEq/L (ref 19–32)
Chloride: 97 mEq/L (ref 96–112)
Creatinine, Ser: 0.63 mg/dL (ref 0.50–1.35)

## 2011-01-02 LAB — CBC
HCT: 25.4 % — ABNORMAL LOW (ref 39.0–52.0)
Hemoglobin: 8.6 g/dL — ABNORMAL LOW (ref 13.0–17.0)
MCH: 30.1 pg (ref 26.0–34.0)
MCHC: 33.9 g/dL (ref 30.0–36.0)
MCV: 88.8 fL (ref 78.0–100.0)

## 2011-01-02 LAB — PREPARE FRESH FROZEN PLASMA
Unit division: 0
Unit division: 0

## 2011-01-02 LAB — GLUCOSE, CAPILLARY

## 2011-01-03 DIAGNOSIS — I739 Peripheral vascular disease, unspecified: Secondary | ICD-10-CM

## 2011-01-03 DIAGNOSIS — S78119A Complete traumatic amputation at level between unspecified hip and knee, initial encounter: Secondary | ICD-10-CM

## 2011-01-03 DIAGNOSIS — L98499 Non-pressure chronic ulcer of skin of other sites with unspecified severity: Secondary | ICD-10-CM

## 2011-01-03 LAB — CBC
HCT: 19.4 % — ABNORMAL LOW (ref 39.0–52.0)
Hemoglobin: 6.8 g/dL — CL (ref 13.0–17.0)
MCHC: 35.1 g/dL (ref 30.0–36.0)
MCV: 89.8 fL (ref 78.0–100.0)

## 2011-01-03 LAB — GLUCOSE, CAPILLARY
Glucose-Capillary: 141 mg/dL — ABNORMAL HIGH (ref 70–99)
Glucose-Capillary: 117 mg/dL — ABNORMAL HIGH (ref 70–99)
Glucose-Capillary: 179 mg/dL — ABNORMAL HIGH (ref 70–99)

## 2011-01-03 LAB — PROTIME-INR
INR: 1.55 — ABNORMAL HIGH (ref 0.00–1.49)
Prothrombin Time: 18.9 seconds — ABNORMAL HIGH (ref 11.6–15.2)

## 2011-01-03 LAB — BASIC METABOLIC PANEL
BUN: 13 mg/dL (ref 6–23)
Creatinine, Ser: 0.59 mg/dL (ref 0.50–1.35)
GFR calc non Af Amer: >90 mL/min (ref >90)
Glucose, Bld: 121 mg/dL — ABNORMAL HIGH (ref 70–99)
Potassium: 3.3 mEq/L — ABNORMAL LOW (ref 3.5–5.1)

## 2011-01-04 ENCOUNTER — Inpatient Hospital Stay (HOSPITAL_COMMUNITY)
Admission: RE | Admit: 2011-01-04 | Discharge: 2011-01-10 | DRG: 945 | Disposition: A | Discharge status: Home Health | Payer: Medicare Other | Source: Ambulatory Visit | Attending: Physical Medicine & Rehabilitation | Admitting: Physical Medicine & Rehabilitation

## 2011-01-04 DIAGNOSIS — I2510 Atherosclerotic heart disease of native coronary artery without angina pectoris: Secondary | ICD-10-CM

## 2011-01-04 DIAGNOSIS — I4891 Unspecified atrial fibrillation: Secondary | ICD-10-CM

## 2011-01-04 DIAGNOSIS — D62 Acute posthemorrhagic anemia: Secondary | ICD-10-CM

## 2011-01-04 DIAGNOSIS — I739 Peripheral vascular disease, unspecified: Secondary | ICD-10-CM

## 2011-01-04 DIAGNOSIS — N400 Enlarged prostate without lower urinary tract symptoms: Secondary | ICD-10-CM

## 2011-01-04 DIAGNOSIS — S78119A Complete traumatic amputation at level between unspecified hip and knee, initial encounter: Secondary | ICD-10-CM

## 2011-01-04 DIAGNOSIS — Z951 Presence of aortocoronary bypass graft: Secondary | ICD-10-CM

## 2011-01-04 DIAGNOSIS — I998 Other disorder of circulatory system: Secondary | ICD-10-CM

## 2011-01-04 DIAGNOSIS — Z5189 Encounter for other specified aftercare: Secondary | ICD-10-CM

## 2011-01-04 DIAGNOSIS — F411 Generalized anxiety disorder: Secondary | ICD-10-CM

## 2011-01-04 DIAGNOSIS — I872 Venous insufficiency (chronic) (peripheral): Secondary | ICD-10-CM

## 2011-01-04 DIAGNOSIS — L98499 Non-pressure chronic ulcer of skin of other sites with unspecified severity: Secondary | ICD-10-CM

## 2011-01-04 DIAGNOSIS — Z7901 Long term (current) use of anticoagulants: Secondary | ICD-10-CM

## 2011-01-04 DIAGNOSIS — I825Y9 Chronic embolism and thrombosis of unspecified deep veins of unspecified proximal lower extremity: Secondary | ICD-10-CM

## 2011-01-04 DIAGNOSIS — Z7982 Long term (current) use of aspirin: Secondary | ICD-10-CM

## 2011-01-04 DIAGNOSIS — Z8546 Personal history of malignant neoplasm of prostate: Secondary | ICD-10-CM

## 2011-01-04 DIAGNOSIS — E119 Type 2 diabetes mellitus without complications: Secondary | ICD-10-CM

## 2011-01-04 DIAGNOSIS — L97509 Non-pressure chronic ulcer of other part of unspecified foot with unspecified severity: Secondary | ICD-10-CM

## 2011-01-04 DIAGNOSIS — E785 Hyperlipidemia, unspecified: Secondary | ICD-10-CM

## 2011-01-04 DIAGNOSIS — Z79899 Other long term (current) drug therapy: Secondary | ICD-10-CM

## 2011-01-04 LAB — TYPE AND SCREEN
Antibody Screen: NEGATIVE
Unit division: 0

## 2011-01-04 LAB — URINALYSIS, ROUTINE W REFLEX MICROSCOPIC
Bilirubin Urine: NEGATIVE
Hgb urine dipstick: NEGATIVE
Protein, ur: NEGATIVE mg/dL
Urobilinogen, UA: 0.2 mg/dL (ref 0.0–1.0)

## 2011-01-04 LAB — PROTIME-INR
INR: 1.63 — ABNORMAL HIGH (ref 0.00–1.49)
Prothrombin Time: 19.6 seconds — ABNORMAL HIGH (ref 11.6–15.2)

## 2011-01-04 LAB — GLUCOSE, CAPILLARY: Glucose-Capillary: 118 mg/dL — ABNORMAL HIGH (ref 70–99)

## 2011-01-05 DIAGNOSIS — I739 Peripheral vascular disease, unspecified: Secondary | ICD-10-CM

## 2011-01-05 DIAGNOSIS — L98499 Non-pressure chronic ulcer of skin of other sites with unspecified severity: Secondary | ICD-10-CM

## 2011-01-05 LAB — PROTIME-INR
INR: 1.45 (ref 0.00–1.49)
Prothrombin Time: 17.9 seconds — ABNORMAL HIGH (ref 11.6–15.2)

## 2011-01-05 LAB — GLUCOSE, CAPILLARY
Glucose-Capillary: 139 mg/dL — ABNORMAL HIGH (ref 70–99)
Glucose-Capillary: 102 mg/dL — ABNORMAL HIGH (ref 70–99)

## 2011-01-06 LAB — CBC
HCT: 20.6 % — ABNORMAL LOW (ref 39.0–52.0)
MCH: 30.3 pg (ref 26.0–34.0)
MCV: 89.2 fL (ref 78.0–100.0)
Platelets: 200 10*3/uL (ref 150–400)
RBC: 2.31 MIL/uL — ABNORMAL LOW (ref 4.22–5.81)
RDW: 16.3 % — ABNORMAL HIGH (ref 11.5–15.5)

## 2011-01-06 LAB — BASIC METABOLIC PANEL
BUN: 14 mg/dL (ref 6–23)
CO2: 28 mEq/L (ref 19–32)
GFR calc non Af Amer: >90 mL/min (ref >90)
Glucose, Bld: 177 mg/dL — ABNORMAL HIGH (ref 70–99)
Potassium: 3.6 mEq/L (ref 3.5–5.1)

## 2011-01-06 LAB — GLUCOSE, CAPILLARY: Glucose-Capillary: 109 mg/dL — ABNORMAL HIGH (ref 70–99)

## 2011-01-06 LAB — ABO/RH: ABO/RH(D): O POS

## 2011-01-07 DIAGNOSIS — L98499 Non-pressure chronic ulcer of skin of other sites with unspecified severity: Secondary | ICD-10-CM

## 2011-01-07 DIAGNOSIS — I739 Peripheral vascular disease, unspecified: Secondary | ICD-10-CM

## 2011-01-07 DIAGNOSIS — S78119A Complete traumatic amputation at level between unspecified hip and knee, initial encounter: Secondary | ICD-10-CM

## 2011-01-07 LAB — DIFFERENTIAL
Basophils Absolute: 0.1 10*3/uL (ref 0.0–0.1)
Eosinophils Absolute: 0.5 10*3/uL (ref 0.0–0.7)
Lymphocytes Relative: 22 % (ref 12–46)
Monocytes Relative: 15 % — ABNORMAL HIGH (ref 3–12)
Neutrophils Relative %: 56 % (ref 43–77)

## 2011-01-07 LAB — CBC
MCV: 89.3 fL (ref 78.0–100.0)
Platelets: 191 10*3/uL (ref 150–400)
RBC: 2.52 MIL/uL — ABNORMAL LOW (ref 4.22–5.81)
RDW: 16.1 % — ABNORMAL HIGH (ref 11.5–15.5)
WBC: 8.5 10*3/uL (ref 4.0–10.5)

## 2011-01-07 LAB — CROSSMATCH
ABO/RH(D): O POS
Antibody Screen: NEGATIVE
Unit division: 0

## 2011-01-07 LAB — GLUCOSE, CAPILLARY
Glucose-Capillary: 134 mg/dL — ABNORMAL HIGH (ref 70–99)
Glucose-Capillary: 115 mg/dL — ABNORMAL HIGH (ref 70–99)

## 2011-01-07 LAB — PROTIME-INR
INR: 1.6 — ABNORMAL HIGH (ref 0.00–1.49)
Prothrombin Time: 19.3 seconds — ABNORMAL HIGH (ref 11.6–15.2)

## 2011-01-07 LAB — COMPREHENSIVE METABOLIC PANEL
ALT: 10 U/L (ref 0–53)
AST: 17 U/L (ref 0–37)
Albumin: 2.5 g/dL — ABNORMAL LOW (ref 3.5–5.2)
CO2: 30 mEq/L (ref 19–32)
Calcium: 8.8 mg/dL (ref 8.4–10.5)
Chloride: 101 mEq/L (ref 96–112)
Creatinine, Ser: 0.71 mg/dL (ref 0.50–1.35)
GFR calc non Af Amer: >90 mL/min (ref >90)
Sodium: 137 mEq/L (ref 135–145)
Total Bilirubin: 0.4 mg/dL (ref 0.3–1.2)

## 2011-01-07 NOTE — Op Note (Signed)
NAME:  Derek Frye, Derek Frye NO.:  [id number]  MEDICAL RECORD NO.:  [id number]  LOCATION:  1506                         FACILITY:  [facility]  PHYSICIAN:  Madlyn Frankel. Charlann Boxer, M.D.  DATE OF BIRTH:  1940-05-02  DATE OF PROCEDURE:  01/01/2011 DATE OF DISCHARGE:                              OPERATIVE REPORT   PREOPERATIVE DIAGNOSIS:  History of infected left total knee replacement, status post resection, complicated by diabetic foot ulcers and chronic venostasis changes with nonhealing wounds.  POSTOPERATIVE DIAGNOSES: 1. History of infected left total knee replacement, status post     resection, complicated by diabetic foot ulcers and chronic     venostasis changes with nonhealing wounds. 2. Diabetes. 3. Coronary artery disease. 4. Chronic Coumadin use.  PROCEDURE:  Left above-the-knee amputation.  SURGEON:  Madlyn Frankel. Charlann Boxer, M.D.  ASSISTANT:  Lanney Gins, PA  Please note that Lanney Gins was physician assistant present for the entirety of the case, utilized for all portions from preoperative position, perioperative traction management, general facilitation of the case as well as for the primary wound closure.  ANESTHESIA:  General.  SPECIMENS:  The leg was sent to Pathology per routine for amputation.  INDICATION FOR PROCEDURE:  Derek Frye is a 70 year old patient of mine who has a history of bilateral total knee replacements.  His left knee had been replaced greater than 4 years ago.  He developed venostasis in addition to his diabetes and had wounds that were nonhealing.  He subsequently developed infection to the left knee and underwent multiple surgical procedures involving resection, I and D, in an attempt to try to get his wounds to heal.  There was never really any intention ever to have to place a new knee based on his lower extremity Pathology. However, it did take a significant amount of time to get even the knee wounds to heal.  After a significant  amount of time with him dealing with it, he ultimately decided as has been previously discussed as an option all along was for an above-the-knee amputation.  We discussed the risks and benefits of this and consent was obtained for above.  PROCEDURE IN DETAIL:  The patient was brought to operative theater. Once adequate anesthesia, preoperative antibiotics administered including Ancef, he was positioned to supine.  A left thigh tourniquet was placed due to his coag values.  A time-out was performed identifying the patient, planned procedure, and the extremity.  The leg was exsanguinated, tourniquet elevated to 250 mmHg.  Utilizing a fishmouth-type incision, approximately 5 cm into the distal femur, sharp dissection initially was carried on the anterior third of this.  This was carried down to the bone and the periosteum was stripped off the femur and an oscillating saw used to create an osteotomy on the femur.  Following the osteotomy, then extended the dissection to the posterior aspect of the knee identifying the neurovascular bundle, amputating the sciatic nerve sharply with a knife, then utilizing Kelly clamps and 0 silk suture,  the vascular bundle was cauterized and suture ligated.  Once this was done and the final debridement and evaluation of wound was carried out, I let the tourniquet down to make sure there  is no significant bleeding.  None was identified, just oozing from soft tissues.  Following irrigating the wound with a pulse lavage normal saline solution, I reapproximated the quadriceps tendon to the posterior muscular tendon over top into the femur.  I then reapproximated subcutaneous tissues and used staples on the skin.  The skin was cleaned, dried, and dressed sterilely and dressed sterilely with Xeroform and a bulky sterile wrap.  Following this, he was awoken from anesthesia and brought to recovery room in stable condition.  Please note again that Derek Frye  was present and utilized for the entirety of the case as noted above.     Madlyn Frankel Charlann Boxer, M.D.     MDO/MEDQ  D:  01/01/2011  T:  01/02/2011  Job:  [id number]  Electronically Signed by Durene Romans M.D. on 01/07/2011 09:15:43 AM

## 2011-01-08 DIAGNOSIS — I739 Peripheral vascular disease, unspecified: Secondary | ICD-10-CM

## 2011-01-08 DIAGNOSIS — L98499 Non-pressure chronic ulcer of skin of other sites with unspecified severity: Secondary | ICD-10-CM

## 2011-01-08 DIAGNOSIS — S78119A Complete traumatic amputation at level between unspecified hip and knee, initial encounter: Secondary | ICD-10-CM

## 2011-01-08 LAB — GLUCOSE, CAPILLARY
Glucose-Capillary: 108 mg/dL — ABNORMAL HIGH (ref 70–99)
Glucose-Capillary: 112 mg/dL — ABNORMAL HIGH (ref 70–99)
Glucose-Capillary: 135 mg/dL — ABNORMAL HIGH (ref 70–99)

## 2011-01-08 LAB — PROTIME-INR
INR: 1.96 — ABNORMAL HIGH (ref 0.00–1.49)
Prothrombin Time: 22.7 seconds — ABNORMAL HIGH (ref 11.6–15.2)

## 2011-01-08 NOTE — H&P (Signed)
NAME:  Derek Frye, Derek Frye NO.:  [id number]  MEDICAL RECORD NO.:  [id number]  LOCATION:  4002                         FACILITY:  [facility]  PHYSICIAN:  Erick Colace, M.D.DATE OF BIRTH:  1940/04/06  DATE OF ADMISSION:  01/04/2011 DATE OF DISCHARGE:                             HISTORY & PHYSICAL   REASON FOR ADMISSION:  Left above-knee amputation.  HISTORY:  A 70 year old male with prior history of left TKA 2 years ago, subsequent infection and knee revision, noted to have cellulitis in June 2011 that spread to knee joint.  The patient had another revision antibiotic spacer placement presented on January 01, 2011, with increased ischemic changes left lower extremity foot ulcer.  Doppler of the left lower extremity showed chronic nonocclusive DVT, left popliteal vein.  The patient presented on chronic Coumadin for PAF.  It was discussed the need for AKA, underwent left AKA January 01, 2011, per Dr. Ihor Gully.  Postoperative anemia 6.8 transfused 1 unit January 03, 2011, with hemoglobin of 7.8.  Coumadin was resumed postoperatively. INR 1.63, bouts of bleeding from left AKA site.  Hemostatic dressing placed. Internal drainage reduces.  PT and OT were consulted because of poor progression.  PMNR was consulted, felt to be a good rehab candidate upon consultation on January 03, 2011.  PAST MEDICAL HISTORY:  Significant for cyanotic discoloration left lower extremity due to antibiotic use and not due to ischemia.  He has a history of diabetes, history of hyperlipidemia, CAD, CABG, BPH, prostate cancer, seed implant in 2007, PAF on chronic Coumadin and multiple staph infections left lower extremity.  He had multiple bouts of antibiotic treatment.  FAMILY HISTORY:  Positive CAD.  SOCIAL HISTORY:  Lives with his wife who can assist as need be.  One level home with ramp entry.  Prior to admission, he was smoking tobacco, negative EtOH history.  FUNCTIONAL  HISTORY:  Using the wheelchair for at least 18 months, sometimes using a walker or crutches when able.  HOME MEDICATIONS: 1. Coumadin. 2. Klonopin 1 mg p.o. b.i.d. p.r.n. 3. Glucophage 850 mg p.o. daily. 4. Flexeril p.r.n. 5. Minocycline 100 p.o. b.i.d. 6. Zocor 40 mg p.o. at bedtime. 7. Lasix 40 mg p.o. daily. 8. Aldactone 25 mg p.o. b.i.d. 9. OxyIR p.r.n. 10.Aspirin 81 mg p.o. daily. 11.Klor-Con 20 mEq p.o. daily.  ALLERGIES:  To MORPHINE SULFATE and AMOXICILLIN.  His last INR was 1.63, BUN 13, creatinine 0.5.  Sodium 136, potassium 3.3, hemoglobin 7.8.  White count 7.1, platelets 140,000.  PHYSICAL EXAMINATION:  VITAL SIGNS:  Blood pressure 106/62, pulse 74, respirations 17 and temp 98.1. GENERAL:  The patient is alert and oriented x3. EYES:  Anicteric, noninjected. EXTERNAL ENT:  Negative. NECK:  Supple without adenopathy.  Respiratory effort is good. LUNGS:  Clear. HEART:  Irregularly irregular.  No murmurs or extra sounds. ABDOMEN:  Positive bowel sounds.  Soft, nontender.  Peripheral pulses are normal in the left lower extremity.  He does have some edema in the right lower extremity.  There is diffuse ecchymosis right lower extremity, sparing great toe. SKIN:  Warm.  He does have evidence of stasis dermatitis in that area. ABDOMEN:  Positive bowel sounds soft  and nontender to palpation. NEUROLOGIC:  Cranial nerves II through XII are intact.  Sensory is decreased in the right foot.  Left AKA stump is in a compressive wrapping.  Not to be changed until January 05, 2011.  Judgment, orientation and mood and affect are appropriate.  Motor strength is 5/5 bilateral deltoid, biceps, triceps, finger flexors, 4 in the right hip flexor, right quad TA and gastroc.  Left lower extremity, he has 3- in the hip flexor and hip abductor.  POST ADMISSION PHYSICIAN EVALUATION: 1. Functional deficits secondary to left AKA postoperative day #3. 2. The patient received collaborative  interdisciplinary care between     the physiatrist, rehab nursing staff, and therapy team. 3. The patient's level of medical complexity and substantial therapy     needs in context without medical necessity cannot be provided at a     lesser intensity of care. 4. The patient has experienced substantial functional loss from his     baseline.  Upon functional assessment at the time of preadmission     screening, the patient was at a min assist level, bed mobility, min     assist transfers and ambulation was not tested.  Upon functional     assessment today, the patient was min guard, bed mobility, min     assist transfers, min assist standing pivot.  He has not been     ambulating.  He is set up with upper body and min assist lower body     dressing, min assist toilet transfers/EM January 04, 2011.  Judging     by the patient's diagnosis, physical exam, and functional history,     the patient has displayed ability to make functional progress,     which will result in measurable gains while in inpatient rehab.     These gains will be of substantial and practical use upon discharge     to home in facilitating mobility, self-care, and independence.     Interim changes in medical status since preadmission screening are     detailed in the history of present illness. 5. Physiatrist to provide 24-hour management of medical needs as well     as oversight of therapy plan/treatment and provide guidance     appropriate regarding the interaction of the two. 6. A 24-hour rehab nursing will assess in management of skin, bowel,     bladder, and help integrate therapy, concepts, techniques,     education. 7. PT will assess and treat for pre gait training, gait training,     endurance.  Safety equipment goals are for a modified independent     level for mobility. 8. OT will assess and treat for ADLs, safety, endurance, equipment.     Goals are for modified independent level with all ADLs. 9. Case  management and social work will assess and treat for     psychosocial issues and discharge planning. 10.Team conference to be held weekly to assess the patient     progress/goals and to determine barriers to discharge. 11.The patient has demonstrated sufficient medical stability and     exercise capacity, able to tolerate at least 3 hours therapy per     day at least 5 days per week. 12.Estimated length of stay is 7 days.  Prognosis for further     functional improvement is good.  MEDICAL PROBLEM LIST AND PLAN: 1. DVT prophylaxis on Coumadin.  INR 1.63.  No bridge with Lovenox. 2. Postop anemia transfuse 1 unit and 25.  Follow up CBC.  We will     start iron. 3. Peripheral vascular disease, chronic venous stasis changes as well     as chronic ecchymosis right lower extremity, but reported adequate     perfusion TEE conservative care. 4. Hypertension, Aldactone. 5. Diabetes, Glucophage 850 daily. 6. Anxiety on Klonopin p.r.n. 7. Motivation and mood appeared to be adequate for full participation     in inpatient rehabilitation program. 8. Rehab Medicine Services were discussed.     Erick Colace, M.D.     AEK/MEDQ  D:  01/04/2011  T:  01/05/2011  Job:  [id number]  cc:   Loraine Leriche C. Vernie Ammons, M.D. Ralene Ok, M.D. Luis Abed, MD, [facility]  Electronically Signed by Claudette Laws M.D. on 01/08/2011 12:32:44 PM

## 2011-01-09 LAB — GLUCOSE, CAPILLARY
Glucose-Capillary: 164 mg/dL — ABNORMAL HIGH (ref 70–99)
Glucose-Capillary: 117 mg/dL — ABNORMAL HIGH (ref 70–99)

## 2011-01-09 LAB — PROTIME-INR: Prothrombin Time: 23.7 seconds — ABNORMAL HIGH (ref 11.6–15.2)

## 2011-01-09 LAB — CBC
HCT: 28.3 % — ABNORMAL LOW (ref 39.0–52.0)
MCH: 29.4 pg (ref 26.0–34.0)
MCV: 89.6 fL (ref 78.0–100.0)
RBC: 3.16 MIL/uL — ABNORMAL LOW (ref 4.22–5.81)
WBC: 8.8 10*3/uL (ref 4.0–10.5)

## 2011-01-10 LAB — GLUCOSE, CAPILLARY: Glucose-Capillary: 129 mg/dL — ABNORMAL HIGH (ref 70–99)

## 2011-01-10 MED ORDER — MAGNESIUM GLUCONATE 500 MG PO TABS
500.0000 mg | ORAL_TABLET | Freq: Every day | ORAL | Status: DC
  Filled 2011-01-10 (×4): qty 1

## 2011-01-10 NOTE — Discharge Summary (Signed)
NAME:  Derek Frye, Derek Frye NO.:  [id number]  MEDICAL RECORD NO.:  [id number]  LOCATION:  1506                         FACILITY:  [facility]  PHYSICIAN:  Madlyn Frankel. Charlann Boxer, M.D.  DATE OF BIRTH:  02-17-1941  DATE OF ADMISSION:  01/01/2011 DATE OF DISCHARGE:  01/04/2011                        DISCHARGE SUMMARY - REFERRING   PROCEDURE:  Left above-the-knee amputation.  ATTENDING PHYSICIAN:  Madlyn Frankel. Charlann Boxer, M.D.  ADMITTING DIAGNOSIS:  Left lower leg chronic osteomyelitis.  DISCHARGE DIAGNOSES: 1. Status post left above-the-knee amputation. 2. Hypertension. 3. Heart disease/coronary disease. 4. Peripheral vascular disease. 5. Hypercholesteremia. 6. Atrial fibrillation. 7. Heart disease. 8. Gallbladder disease. 9. Prostate disease. 10.Non-insulin diabetes. 11.History of multiple Staph and Strep infections with cellulitis of     the left lower leg.  HISTORY OF PRESENT ILLNESS:  Patient is a 70 year old white male, in no acute distress.  Patient has had a total knee arthroplasty on the left knee many years ago.  Did well with this for quite sometime.  He eventually did have an infection with revision of the left knee.  Again, he did quite well for a number of years and in June 2011, he started having infection/cellulitis of the left lower extremity.  He was being treated for an unknown amount of time by his primary care physician with oral antibiotics.  Infection essentially spread to the knee.  Since that time, he has had multiple surgeries with revisions and antibiotic spacer placements.  Patient ended with an antibiotic spacer within the left knee.  Various options were discussed with the patient.  The patient eventually made the decision that he would like to proceed with above- the-knee amputation for definitive treatment of chronic osteomyelitis of the left knee and lower leg.  HOSPITAL COURSE:  Patient underwent the above-stated procedure on January 01, 2011.   The patient tolerated the procedure well, was brought to the recovery room in good condition, and  subsequently to the floor.  Postop day 1, January 02, 2011, the patient doing well, feels good, afebrile, vital signs stable.  Left lower extremity dressing was dry, hematocrit was 25.4.  CIR with consult was obtained for placement after discharge.  On January 03, 2011, the patient doing well, really does not feel bad though he was given 2 units of packed red blood cells postop acute blood loss anemia.  Patient was afebrile, vital signs stable,  H and H was 6.8/19.4.  Postop day 3, January 04, 2011, the patient's dressing did fall off because it saturated with blood.  Hemostatic dressings were put in place, which has controlled bleeding from the incision.  Patient is otherwise doing well, no events.  Pain is controlled.  Patient is very happy with the results of the surgery so far and wondering why he waited so long.  Patient is felt to be doing well enough to be discharged to rehab today.  DISCHARGE CONDITION:  Good.  DISCHARGE INSTRUCTIONS:  Patient will be discharged to CIR today. Patient should have hemostatic dressings for dressing changes. Patient's Levaquin will be discontinued, but he will continue on his minocycline until his staples are removed in 2-3 weeks at [facility].  Patient will follow up with  Dr. Charlann Boxer at [facility]. Patient is to call with any questions or concerns.  DISCHARGE MEDICATIONS: 1. Tylenol 325 mg 1-2 p.o. q.4 hours p.r.n. pain. 2. Benadryl 25 mg one p.o. q.4 hours p.r.n. 3. Iron sulfate 325 mg one p.o. t.i.d. x2-3 weeks. 4. MiraLAX 17 g p.o. q. day constipation. 5. Robaxin 500 mg one p.o. q.6 hours p.r.n. muscle spasms. 6. Minocycline 100 mg one p.o. b.i.d., take until staples are removed. 7. Oxycodone 10 mg one p.o. q.4 hours p.r.n. pain. 8. Aspirin 81 mg one p.o. q. day. 9. Clonazepam 1 mg half to one tablet by mouth twice  daily as needed     for anxiety. 10.Docusate 100 mg 1-2 p.o. q. day p.r.n. constipation. 11.Klor-Con potassium powder 20 mEq/packet two p.o. q. day. 12.Lasix 40 mg one p.o. q.a.m. 13.Magnesium gluconate 500 mg one p.o. q. day. 14.Metformin 850 mg one p.o. q.a.m. 15.Metolazone 2.5 mg one p.o. q.a.m. 16.Spironolactone 25 mg one p.o. b.i.d. 17.Simvastatin 40 mg one p.o. q.h.s. 18.Coumadin 4 mg p.o. q. day to be monitored and adjusted by facility     pharmacy to maintain INR between 2 and 2.5.    ______________________________ Lanney Gins, PA   ______________________________ Madlyn Frankel. Charlann Boxer, M.D.    MB/MEDQ  D:  01/04/2011  T:  01/04/2011  Job:  [id number]  Electronically Signed by Lanney Gins PA on 01/08/2011 04:54:09 PM Electronically Signed by Durene Romans M.D. on 01/10/2011 11:14:02 AM

## 2011-01-11 ENCOUNTER — Encounter (HOSPITAL_BASED_OUTPATIENT_CLINIC_OR_DEPARTMENT_OTHER): Payer: Medicare Other

## 2011-01-13 NOTE — Discharge Summary (Signed)
NAME:  Derek Frye, Derek Frye NO.:  [id number]  MEDICAL RECORD NO.:  [id number]  LOCATION:  4002                         FACILITY:  [facility]  PHYSICIAN:  Ranelle Oyster, M.D.DATE OF BIRTH:  March 11, 1941  DATE OF ADMISSION:  01/04/2011 DATE OF DISCHARGE:  01/10/2011                              DISCHARGE SUMMARY   DISCHARGE DIAGNOSES: 1. Left above-knee amputation, January 01, 2011. 2. Chronic Coumadin therapy. 3. Postoperative anemia. 4. Peripheral vascular disease with chronic stasis changes. 5. Hypertension. 6. Diabetes mellitus. 7. Anxiety.  HISTORY:  A 70 year old male with history of left total knee replacement many years ago, subsequent infection with knee revisions noted August 20, 2010, with cellulitis, left lower extremity that again seeded his knee with revision and antibiotic spacer was placed.  Presented January 01, 2011, with increased ischemic changes, left lower extremity and foot ulcer.  Doppler of left lower extremity with chronic nonocclusive deep vein thrombosis, left popiteal.  He was on chronic Coumadin for atrial fibrillation.  It was discussed the need for above-knee amputation, which he underwent on January 01, 2011, by Dr. Charlann Boxer.  Postoperative anemia is 6.8, transfused 1 unit of packed red blood cells.  Coumadin resumed and monitored.  Bouts of bleeding from surgical site with hemostatic dressing placed.  He was minimum to moderate assist for mobility.  He was admitted for comprehensive rehab program.  PAST MEDICAL HISTORY:  See discharge diagnoses.  He smokes 1/2-1 pack per day.  No alcohol.  ALLERGIES:  MORPHINE SULPHATE and AMOXICILLIN.  SOCIAL HISTORY:  He lives with his wife and assistance as needed.  One- level home with a ramp.  FUNCTIONAL HISTORY PRIOR TO ADMISSION:  He uses crutches, walker, wheelchair.  He does not drive.  FUNCTIONAL STATUS UPON ADMISSION TO REHAB SERVICE:  Minimum assist for bed mobility and transfers,  ambulation not tested, maximum assistance for lower body activities of daily living.  MEDICATIONS PRIOR TO ADMISSION: 1. Coumadin daily. 2. Klonopin 1 mg twice daily as needed. 3. Glucophage 850 mg daily. 4. Flexeril as needed. 5. Minocin 100 mg twice daily. 6. Zocor 40 mg at bedtime. 7. Lasix 40 mg daily. 8. Aldactone 25 mg twice daily. 9. Oxycodone as needed. 10.Aspirin 81 mg daily. 11.Potassium 20 mEq daily.  PHYSICAL EXAMINATION:  VITAL SIGNS:  Blood pressure 106/62, pulse 74, temperature 98.1, respirations 17. GENERAL:  This was an alert male, in no acute distress, oriented x3. Deep tendon reflexes hypoactive.  Sensation decreased to light touch distally.  Dressing in place to left above-knee amputation site.  Noted diffuse ecchymosis, right lower extremity.  Skin was warm to touch. LUNGS:  Clear to auscultation. CARDIAC:  Regular rate and rhythm. ABDOMEN:  Soft, nontender.  Good bowel sounds.  REHABILITATION HOSPITAL COURSE:  The patient was admitted to inpatient rehab services with therapies initiated on a 3-hour daily basis consisting of physical therapy, occupational therapy, and rehabilitation nursing.  The following issues were addressed during the patient's rehabilitation stay.  Pertaining to [name]' left above-knee amputation on October 23, surgical site healing nicely.  He initially had a hemostatic dressing in place that was later removed and placed with an Ace wrap.  He would follow  up with Dr. Charlann Boxer in 2 weeks for removal of staple as well as plans to be seen [facility].  He remained on chronic Coumadin therapy for atrial fibrillation followed by the [facility] with latest INR of 1.96.  Pain management ongoing with the use of oxycodone with good results.  Postoperative anemia remained stable with latest hemoglobin 8.2, hematocrit 22.5.  His blood pressures were well controlled with Aldactone and metolazone. Blood sugars with noted  history of diabetes mellitus on Glucophage, full diabetic teaching was completed.  Blood sugars are within acceptable range.  He remained on Minocin for wound coverage and he would continue with this until his staples were removed.  The patient received weekly collaborative interdisciplinary team conferences to discuss estimated length of stay, family teaching, and any barriers to discharge.  He was ambulating 30 feet minimal assistance.  His strength and endurance have greatly improved.  Wheelchair mobility with supervision.  Minimal assist for lower body dressing.  He received preprosthetic training by [facility].  The patient was discharged home with ongoing therapies as dictated for rehab services.  DISCHARGE MEDICATIONS: 1. Senokot tablets 2 at bedtime. 2. Glucophage 850 mg daily. 3. Zaroxolyn 2.5 mg daily. 4. Minocin 100 mg twice daily until staples removed. 5. Zocor 40 mg daily. 6. Aldactone 25 mg twice daily. 7. Magnesium gluconate 500 mg daily. 8. Potassium chloride 20 mEq daily. 9. Aspirin 81 mg daily. 10.Ferrous sulfate 325 mg daily. 11.Lasix 40 mg daily. 12.Oxycodone immediate release 5 mg 1-2 tablets every 4 hours as     needed pain, dispensed to 90 tablets.  DIET:  Diabetic diet.  SPECIAL INSTRUCTIONS:  Continue therapies as dictated per rehab services.  Follow up with Dr. Charlann Boxer in 2 weeks for removal of staples. Appointments made with the [facility] with Dr. Riley Kill. He will continue Coumadin at 2.5 mg daily for atrial fibrillation with a goal INR of 2.0-3.0.  Home health nurse had been arranged for prothrombin times with results to [facility] for ongoing monitoring of Coumadin therapy.     Mariam Dollar, P.A.   ______________________________ Ranelle Oyster, M.D.    DA/MEDQ  D:  01/09/2011  T:  01/09/2011  Job:  [id number]  cc:   Henri Medal, MD Luis Abed, MD, [facility] Madlyn Frankel Charlann Boxer, M.D.  Electronically  Signed by Mariam Dollar P.A. on 01/11/2011 03:19:01 PM Electronically Signed by Faith Rogue M.D. on 01/13/2011 07:58:45 PM

## 2011-01-14 ENCOUNTER — Ambulatory Visit (INDEPENDENT_AMBULATORY_CARE_PROVIDER_SITE_OTHER): Payer: Self-pay | Admitting: Cardiology

## 2011-01-14 DIAGNOSIS — Z7901 Long term (current) use of anticoagulants: Secondary | ICD-10-CM

## 2011-01-14 DIAGNOSIS — I4891 Unspecified atrial fibrillation: Secondary | ICD-10-CM

## 2011-01-21 ENCOUNTER — Ambulatory Visit: Payer: Self-pay | Admitting: Cardiology

## 2011-01-21 DIAGNOSIS — I4891 Unspecified atrial fibrillation: Secondary | ICD-10-CM

## 2011-01-21 DIAGNOSIS — Z7901 Long term (current) use of anticoagulants: Secondary | ICD-10-CM

## 2011-01-24 DIAGNOSIS — Z0271 Encounter for disability determination: Secondary | ICD-10-CM

## 2011-01-28 NOTE — H&P (Signed)
  NAME:  Derek Frye, Derek Frye NO.:  [id number]  MEDICAL RECORD NO.:  [id number]  LOCATION:  FOOT                         FACILITY:  [facility]  PHYSICIAN:  Ardath Sax, M.D.     DATE OF BIRTH:  23-Sep-1940  DATE OF ADMISSION:  10/25/2010 DATE OF DISCHARGE:                             HISTORY & PHYSICAL   HISTORY OF PRESENT ILLNESS:  The patient is a 70 year old diabetic who has had problems with his circulation and problems with an infected left total knee that finally all of the hardware had to be removed and he just has a spacer in there.  He has been on long-term antibiotics and he presently has a ulcer about 2 cm in diameter both in his left medial ankle and his left heel.  These have been treated with Unna boots and on several occasions he has had Oasis applied and he even had an attempted vascular surgery where they were able to catheterize him and they could not improve his circulation.  However, he does have good palpable pulses.  His legs are discolored from the venous stasis from the knee down to the feet.  They are really quite black.  He is a smoker and he has had open heart surgery in the past 2 times.  He also has had prostate cancer treated by radiation.  His diabetes is medically managed very well.  He does have atrial fibrillation.  He is able to walk and we are going to treat him with the silver alginate and compression with the Unna boots on the left leg.  He will come back here in a week.     Ardath Sax, M.D.     PP/MEDQ  D:  10/25/2010  T:  10/26/2010  Job:  [id number]  Electronically Signed by Ardath Sax  on 01/28/2011 02:43:25 PM

## 2011-02-01 ENCOUNTER — Ambulatory Visit (INDEPENDENT_AMBULATORY_CARE_PROVIDER_SITE_OTHER): Payer: Self-pay | Admitting: Cardiovascular Disease

## 2011-02-01 DIAGNOSIS — R0989 Other specified symptoms and signs involving the circulatory and respiratory systems: Secondary | ICD-10-CM

## 2011-02-01 DIAGNOSIS — I4891 Unspecified atrial fibrillation: Secondary | ICD-10-CM

## 2011-02-01 DIAGNOSIS — Z7901 Long term (current) use of anticoagulants: Secondary | ICD-10-CM

## 2011-02-04 ENCOUNTER — Encounter: Payer: Self-pay | Admitting: Surgery

## 2011-02-04 ENCOUNTER — Ambulatory Visit (INDEPENDENT_AMBULATORY_CARE_PROVIDER_SITE_OTHER): Payer: Medicare Other | Admitting: Surgery

## 2011-02-04 ENCOUNTER — Encounter (INDEPENDENT_AMBULATORY_CARE_PROVIDER_SITE_OTHER): Payer: Medicare Other | Admitting: *Deleted

## 2011-02-04 VITALS — BP 126/69 | HR 73

## 2011-02-04 DIAGNOSIS — I714 Abdominal aortic aneurysm, without rupture, unspecified: Secondary | ICD-10-CM

## 2011-02-04 NOTE — Progress Notes (Signed)
[facility]   Patient name: Derek Frye MRN: [id number] DOB: 1940-08-15 Sex: male     Chief Complaint  Patient presents with  . AAA    6 Month follow up with vascular labs today    HISTORY OF PRESENT ILLNESS: The patient comes back today for followup of his abdominal aortic aneurysm.  Have last seen him he has undergone left above-knee dictation. He has a history of infected hardware in his left knee as well as open wound on his left ankle. Over the course of the past 2 years he has lost approximately 100 pounds. His diabetes is better controlled and is now down to taking one oral agent. He denies any abdominal or back pain at this time.  Past Medical History  Diagnosis Date  . HTN (hypertension)   . Obesity   . History of cholecystectomy   . CAD (coronary artery disease)     Status post CABG followed by redo in 1994 / nuclear 2007, old infarct, no ischemia  . Ejection fraction < 50%     EF  45%...echo 2004 /  EF 45%, echo, August, 2012  . Hyperlipidemia   . Cigarette smoker     He is now smoking cigars and needs to quit.  . 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 no replacement until leg is stable  . Prostate cancer     History of prostate cancer with a seed implant and this is stable.  . Abdominal aortic aneurysm     is stable, being followed  . Atrial fibrillation     Rate control / going in and out of atrial fib, Coumadin  . Volume overload   . Right bundle branch block     noted November 09, 2008.  . Warfarin anticoagulation     Atrial fibrillation  . Carotid artery disease     Doppler September 2 009, 40-50% LICA / Doppler October, 2010, 0-39% R. ICA 40-59% LICA  . Diabetes mellitus   . Myocardial infarction 1984  . Ulcer   . Peripheral vascular disease   . Preop cardiovascular exam     Surgical clearance for left AKA October, 2012    Past Surgical History  Procedure Date  . Total knee arthroplasty    . Coronary artery bypass graft 1994    x2  . Anal fissure repair   . Pr vein bypass graft,aorto-fem-pop     History   Social History  . Marital Status: Married    Spouse Name: N/A    Number of Children: N/A  . Years of Education: N/A   Occupational History  . Not on file.   Social History Main Topics  . Smoking status: Current Some Day Smoker -- 0.5 packs/day for 30 years    Types: Cigars, Cigarettes  . Smokeless tobacco: Not on file  . Alcohol Use: Yes     occasionally  . Drug Use: Not on file  . Sexually Active: Not on file   Other Topics Concern  . Not on file   Social History Narrative  . No narrative on file    Family History  Problem Relation Age of Onset  . Stroke Father 40  . Diabetes Other     Allergies as of 02/04/2011 - Review Complete 02/04/2011  Allergen Reaction Noted  . Amoxicillin    . Morphine Nausea And Vomiting     Current Outpatient Prescriptions on File Prior to Visit  Medication Sig Dispense Refill  . acetaminophen (TYLENOL) 325 MG tablet Take 325-650 mg by mouth every 4 (four) hours as needed. For pain        . aspirin 81 MG tablet Take 81 mg by mouth daily.       . diphenhydrAMINE (BENADRYL) 25 mg capsule Take 25 mg by mouth every 4 (four) hours as needed. For itching        . docusate sodium (COLACE) 100 MG capsule Take 100-200 mg by mouth daily as needed. For constipation      . ENSURE (ENSURE) Take 237 mLs by mouth 2 (two) times daily.       . ferrous sulfate 325 (65 FE) MG tablet Take 325 mg by mouth 3 (three) times daily with meals.        . furosemide (LASIX) 40 MG tablet Take 40 mg by mouth every morning. Take one (1) tablet by mouth daily.      . magnesium gluconate (MAGONATE) 500 MG tablet Take 500 mg by mouth daily.       . metFORMIN (GLUCOPHAGE) 850 MG tablet Take 850 mg by mouth every morning.       . metolazone (ZAROXOLYN) 2.5 MG tablet Take 2.5 mg by mouth daily.       Marland Kitchen oxycodone (OXY-IR) 5 MG capsule Take 10 mg by  mouth every 4 (four) hours as needed. For pain      . polyethylene glycol (MIRALAX / GLYCOLAX) packet Take 17 g by mouth daily as needed. For constipation        . potassium chloride SA (K-DUR,KLOR-CON) 20 MEQ tablet Take 40 mEq by mouth daily.       . simvastatin (ZOCOR) 40 MG tablet TAKE 1 TABLET AT BEDTIME  90 tablet  2  . spironolactone (ALDACTONE) 25 MG tablet Take 25 mg by mouth daily. Take one (1) tablet by mouth two (2) times a day.      . warfarin (COUMADIN) 4 MG tablet Take 4 mg by mouth daily.        . clonazePAM (KLONOPIN) 1 MG tablet Take 0.5-1 mg by mouth 2 (two) times daily as needed. For anxiety      . methocarbamol (ROBAXIN) 500 MG tablet Take 500 mg by mouth every 6 (six) hours as needed. For spasms        . minocycline (MINOCIN,DYNACIN) 100 MG capsule Take 1 tablet by mouth Twice daily.         REVIEW OF SYSTEMS: Cardiovascular: No chest pain, chest pressure, palpitations, orthopnea, or dyspnea on exertion. No claudication or rest pain,  Pulmonary: No productive cough, asthma or wheezing. Neurologic: No weakness, paresthesias, aphasia, or amaurosis. No dizziness. Hematologic: No bleeding problems or clotting disorders. Musculoskeletal: Recently status post left above-knee amputation Gastrointestinal: No blood in stool or hematemesis Genitourinary: No dysuria or hematuria. Psychiatric:: No history of major depression. Integumentary: Darkening of pigmentation throughout right leg Constitutional: No fever or chills.  PHYSICAL EXAMINATION:   Vital signs are BP 126/69  Pulse 73 General: The patient appears their stated age. HEENT:  No gross abnormalities Pulmonary:  Non labored breathing Abdomen: Soft and non-tender Musculoskeletal: Left above-knee amputation stump is healing nicely .Neurologic: No focal weakness or paresthesias are detected, Skin: There are no ulcer or rashes noted. He does have hyperpigmentation of the right leg Psychiatric: The patient has normal  affect. Cardiovascular: Palpable right dorsalis pedis pulse   Diagnostic Studies Ultrasound was performed today which shows a 5.1 cm aneurysm. This  is slightly larger than study 6 months ago  Assessment: Abdominal aortic aneurysm Plan: With the patient's multiple comorbidities I would not recommend fixing his aneurysm until it became greater than 5 a half centimeters. We discussed the signs and symptoms of rupture and what to do should this occur. I'll keep him on a 6 month ultrasound surveillance protocol calcium back in one year.  Jorge Ny, M.D. [facility] Office: [phone] Pager:  [phone]

## 2011-02-19 ENCOUNTER — Encounter: Payer: Medicare Other | Attending: Physical Medicine & Rehabilitation | Admitting: Physical Medicine & Rehabilitation

## 2011-02-19 DIAGNOSIS — I739 Peripheral vascular disease, unspecified: Secondary | ICD-10-CM

## 2011-02-19 DIAGNOSIS — S78119A Complete traumatic amputation at level between unspecified hip and knee, initial encounter: Secondary | ICD-10-CM

## 2011-02-19 DIAGNOSIS — G547 Phantom limb syndrome without pain: Secondary | ICD-10-CM

## 2011-02-19 DIAGNOSIS — E119 Type 2 diabetes mellitus without complications: Secondary | ICD-10-CM | POA: Insufficient documentation

## 2011-02-19 DIAGNOSIS — L98499 Non-pressure chronic ulcer of skin of other sites with unspecified severity: Secondary | ICD-10-CM

## 2011-02-19 DIAGNOSIS — Z9849 Cataract extraction status, unspecified eye: Secondary | ICD-10-CM | POA: Insufficient documentation

## 2011-02-19 NOTE — Assessment & Plan Note (Signed)
HISTORY:  Derek Frye is back regarding his left above-knee amputation.  He was discharged from rehab on November 1.  He has been receiving home health therapies and progressing along.  Therapy is working on some standing and short distance walking with crutches.  Still quite a bit deconditioned from a pulmonary standpoint.  Left leg is healing nicely. He had an abrasion to the right leg which is dressed currently.  Using [employer] for stump shrinkers and recently placed in a short stump shrinker the other day.  He gets back for the new shrinker apparently this week.  He remains on Coumadin for anticoagulation.  Pain is generally under control.  He is complaining of phantom limb symptoms particularly at nighttime.  Rate is 6 to 7/10 when I do strike.  He is on Neurontin 100 mg 3 times a day per Dr. Charlann Boxer.  REVIEW OF SYSTEMS:  Notable for the above.  Full 12-point review is in the written health and history section of the chart.  SOCIAL HISTORY:  The patient is married, still smoking half-pack of cigarettes per day.  Wife is with him today.  PHYSICAL EXAMINATION:  VITAL SIGNS:  Blood pressure 102/55, pulse 55, respiratory rate 16, and saturation 100% on room air. GENERAL:  The patient is pleasant, alert.  He moves all 4 limbs.  He has unlimited range of motion in both upper extremities with 4-5/5 strength. Multiple bruises and ecchymoses are noted on the forearms and hands. Right leg is notable for dark discoloration of the limb below the knees. He has a bandage over the right lower leg which I did not remove today. Left above the knee residual limb is clean intact with only minimal scab noted.  He has almost normal range of motion of the hip in all fields. Strength is grossly 4/5.  Cognitively, the patient is alert and appropriate.  Good insight and awareness. HEART:  Regular. CHEST:  Clear. ABDOMEN:  Soft and nontender.  ASSESSMENT: 1. Left above-knee amputation. 2. Phantom limb  pain. 3. History of diabetes mellitus. 4. Chronic Coumadin therapy.  PLANS: 1. the patient is a K2 ambulator.  I wrote a basic above knee     prosthesis prescription for the patient which will be built by     [employer].  I will be happy to sign off on the componentry and then     eventually transition the patient to outpatient physical therapy     for gait training.  He will continue with his home therapy to build     up stamina and preprosthetic elements. 2. We will increase his Neurontin to 100 mg b.i.d. with 200-300 mg at     bedtime.  He uses oxycodone for breakthrough pain if needed,     although I think he should try to get away from that as possible,     and rely more on     the Neurontin for the phantom limb symptoms. 3. I will see the patient back as needed.  He will follow up with Dr.     Charlann Boxer, this week apparently as well.     Ranelle Oyster, M.D. Electronically Signed    ZTS/MedQ D:  02/19/2011 10:52:07  T:  02/19/2011 12:38:38  Job #:  [id number]  cc:   Madlyn Frankel Charlann Boxer, M.D. Fax: [phone]

## 2011-02-25 ENCOUNTER — Ambulatory Visit (INDEPENDENT_AMBULATORY_CARE_PROVIDER_SITE_OTHER): Payer: Medicare Other | Admitting: *Deleted

## 2011-02-25 DIAGNOSIS — I4891 Unspecified atrial fibrillation: Secondary | ICD-10-CM

## 2011-02-25 DIAGNOSIS — Z7901 Long term (current) use of anticoagulants: Secondary | ICD-10-CM

## 2011-02-25 DIAGNOSIS — Z8679 Personal history of other diseases of the circulatory system: Secondary | ICD-10-CM

## 2011-02-25 NOTE — Procedures (Unsigned)
DUPLEX ULTRASOUND OF ABDOMINAL AORTA  INDICATION:  Followup AAA  HISTORY: Diabetes:  Yes Cardiac:  Yes Hypertension:  Yes Smoking:  Yes Connective Tissue Disorder: Family History:  No Previous Surgery:  No  DUPLEX EXAM:         AP (cm)                   TRANSVERSE (cm) Proximal             1.9 cm                    cm Mid                  5.1 cm                    5.07 cm Distal               NV                        cm Right Iliac          2.36 cm                   2.24 cm Left Iliac           1.18 cm                   NV  PREVIOUS:  Date:  07/09/2010  AP:  4.7  TRANSVERSE:  4.7  IMPRESSION: 1. Abdominal aortic aneurysm measuring approximately 5.1 cm on today's     exam. 2. 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 3. Some areas were poorly visualized due to bowel gas.  ___________________________________________ V. Charlena Cross, MD  LT/MEDQ  D:  02/04/2011  T:  02/04/2011  Job:  [id number]

## 2011-03-15 ENCOUNTER — Encounter (HOSPITAL_BASED_OUTPATIENT_CLINIC_OR_DEPARTMENT_OTHER): Payer: Medicare Other | Attending: General Surgery

## 2011-03-15 DIAGNOSIS — L97209 Non-pressure chronic ulcer of unspecified calf with unspecified severity: Secondary | ICD-10-CM | POA: Insufficient documentation

## 2011-03-15 DIAGNOSIS — I872 Venous insufficiency (chronic) (peripheral): Secondary | ICD-10-CM | POA: Insufficient documentation

## 2011-03-16 ENCOUNTER — Other Ambulatory Visit: Payer: Self-pay | Admitting: Cardiology

## 2011-03-25 ENCOUNTER — Telehealth: Payer: Self-pay | Admitting: Cardiology

## 2011-03-25 ENCOUNTER — Ambulatory Visit (INDEPENDENT_AMBULATORY_CARE_PROVIDER_SITE_OTHER): Payer: Medicare Other | Admitting: *Deleted

## 2011-03-25 DIAGNOSIS — I4891 Unspecified atrial fibrillation: Secondary | ICD-10-CM

## 2011-03-25 DIAGNOSIS — Z8679 Personal history of other diseases of the circulatory system: Secondary | ICD-10-CM

## 2011-03-25 DIAGNOSIS — Z7901 Long term (current) use of anticoagulants: Secondary | ICD-10-CM

## 2011-03-25 LAB — POCT INR: INR: 1.5

## 2011-03-25 NOTE — Patient Instructions (Signed)
Instructed patient and wife on leafy green vegetable intake, provided sheet, consistency.

## 2011-03-25 NOTE — Telephone Encounter (Addendum)
Walk in pt Form " Pt Dropped off Dept of Transportation papers to be completed" sent to Message Nurse 03/25/11/KM  Dr.Katz Signed His Portion of the Dept Of Transportation paper" called Pt @ [phone] No answer 04/02/11/KM

## 2011-04-02 ENCOUNTER — Telehealth: Payer: Self-pay | Admitting: Cardiology

## 2011-04-02 NOTE — Telephone Encounter (Signed)
Follow-up:    Patient's wife called to see what the progress of her husbands DMV paper work that they had dropped off for Dr. Myrtis Ser to sign was. Please call back.

## 2011-04-02 NOTE — Telephone Encounter (Signed)
Derek Frye was notified that the paperwork has been filled out, signed and returned to medical records.

## 2011-04-08 ENCOUNTER — Ambulatory Visit (INDEPENDENT_AMBULATORY_CARE_PROVIDER_SITE_OTHER): Payer: Medicare Other | Admitting: *Deleted

## 2011-04-08 DIAGNOSIS — Z8679 Personal history of other diseases of the circulatory system: Secondary | ICD-10-CM

## 2011-04-08 DIAGNOSIS — I4891 Unspecified atrial fibrillation: Secondary | ICD-10-CM

## 2011-04-08 DIAGNOSIS — Z7901 Long term (current) use of anticoagulants: Secondary | ICD-10-CM

## 2011-04-08 LAB — POCT INR: INR: 2.6

## 2011-04-11 ENCOUNTER — Other Ambulatory Visit: Payer: Self-pay | Admitting: *Deleted

## 2011-04-12 ENCOUNTER — Encounter (HOSPITAL_BASED_OUTPATIENT_CLINIC_OR_DEPARTMENT_OTHER): Payer: Medicare Other | Attending: General Surgery

## 2011-04-12 DIAGNOSIS — I872 Venous insufficiency (chronic) (peripheral): Secondary | ICD-10-CM | POA: Insufficient documentation

## 2011-04-12 DIAGNOSIS — L97209 Non-pressure chronic ulcer of unspecified calf with unspecified severity: Secondary | ICD-10-CM | POA: Insufficient documentation

## 2011-04-25 ENCOUNTER — Ambulatory Visit: Payer: Medicare Other | Attending: Physical Medicine & Rehabilitation | Admitting: Physical Therapy

## 2011-04-25 DIAGNOSIS — M6281 Muscle weakness (generalized): Secondary | ICD-10-CM | POA: Insufficient documentation

## 2011-04-25 DIAGNOSIS — IMO0001 Reserved for inherently not codable concepts without codable children: Secondary | ICD-10-CM | POA: Insufficient documentation

## 2011-04-25 DIAGNOSIS — R269 Unspecified abnormalities of gait and mobility: Secondary | ICD-10-CM | POA: Insufficient documentation

## 2011-04-25 DIAGNOSIS — S78119A Complete traumatic amputation at level between unspecified hip and knee, initial encounter: Secondary | ICD-10-CM | POA: Insufficient documentation

## 2011-04-25 DIAGNOSIS — R5381 Other malaise: Secondary | ICD-10-CM | POA: Insufficient documentation

## 2011-04-30 ENCOUNTER — Ambulatory Visit: Payer: Medicare Other | Admitting: Physical Therapy

## 2011-05-03 ENCOUNTER — Encounter: Payer: Medicare Other | Admitting: Physical Therapy

## 2011-05-03 DIAGNOSIS — Z0271 Encounter for disability determination: Secondary | ICD-10-CM

## 2011-05-06 ENCOUNTER — Ambulatory Visit: Payer: Medicare Other | Admitting: Physical Therapy

## 2011-05-08 ENCOUNTER — Ambulatory Visit: Payer: Medicare Other | Admitting: Physical Therapy

## 2011-05-10 ENCOUNTER — Encounter (HOSPITAL_BASED_OUTPATIENT_CLINIC_OR_DEPARTMENT_OTHER): Payer: Medicare Other | Attending: General Surgery

## 2011-05-10 ENCOUNTER — Ambulatory Visit (INDEPENDENT_AMBULATORY_CARE_PROVIDER_SITE_OTHER): Payer: Medicare Other | Admitting: *Deleted

## 2011-05-10 DIAGNOSIS — E785 Hyperlipidemia, unspecified: Secondary | ICD-10-CM | POA: Insufficient documentation

## 2011-05-10 DIAGNOSIS — E1149 Type 2 diabetes mellitus with other diabetic neurological complication: Secondary | ICD-10-CM | POA: Insufficient documentation

## 2011-05-10 DIAGNOSIS — I252 Old myocardial infarction: Secondary | ICD-10-CM | POA: Insufficient documentation

## 2011-05-10 DIAGNOSIS — L97809 Non-pressure chronic ulcer of other part of unspecified lower leg with unspecified severity: Secondary | ICD-10-CM | POA: Insufficient documentation

## 2011-05-10 DIAGNOSIS — Z79899 Other long term (current) drug therapy: Secondary | ICD-10-CM | POA: Insufficient documentation

## 2011-05-10 DIAGNOSIS — I872 Venous insufficiency (chronic) (peripheral): Secondary | ICD-10-CM | POA: Insufficient documentation

## 2011-05-10 DIAGNOSIS — I4891 Unspecified atrial fibrillation: Secondary | ICD-10-CM

## 2011-05-10 DIAGNOSIS — I10 Essential (primary) hypertension: Secondary | ICD-10-CM | POA: Insufficient documentation

## 2011-05-10 DIAGNOSIS — Z8679 Personal history of other diseases of the circulatory system: Secondary | ICD-10-CM

## 2011-05-10 DIAGNOSIS — Z7901 Long term (current) use of anticoagulants: Secondary | ICD-10-CM

## 2011-05-10 DIAGNOSIS — E1142 Type 2 diabetes mellitus with diabetic polyneuropathy: Secondary | ICD-10-CM | POA: Insufficient documentation

## 2011-05-10 LAB — POCT INR: INR: 2.2

## 2011-05-13 ENCOUNTER — Ambulatory Visit: Payer: Medicare Other | Attending: Physical Medicine & Rehabilitation | Admitting: Physical Therapy

## 2011-05-13 DIAGNOSIS — M6281 Muscle weakness (generalized): Secondary | ICD-10-CM | POA: Insufficient documentation

## 2011-05-13 DIAGNOSIS — IMO0001 Reserved for inherently not codable concepts without codable children: Secondary | ICD-10-CM | POA: Insufficient documentation

## 2011-05-13 DIAGNOSIS — S78119A Complete traumatic amputation at level between unspecified hip and knee, initial encounter: Secondary | ICD-10-CM | POA: Insufficient documentation

## 2011-05-13 DIAGNOSIS — R5381 Other malaise: Secondary | ICD-10-CM | POA: Insufficient documentation

## 2011-05-13 DIAGNOSIS — R269 Unspecified abnormalities of gait and mobility: Secondary | ICD-10-CM | POA: Insufficient documentation

## 2011-05-16 ENCOUNTER — Ambulatory Visit: Payer: Medicare Other | Admitting: Physical Therapy

## 2011-05-20 ENCOUNTER — Ambulatory Visit: Payer: Medicare Other | Admitting: Physical Therapy

## 2011-05-21 ENCOUNTER — Ambulatory Visit: Payer: Medicare Other | Admitting: Physical Therapy

## 2011-05-27 ENCOUNTER — Ambulatory Visit: Payer: Medicare Other | Admitting: Physical Therapy

## 2011-05-27 ENCOUNTER — Encounter: Payer: Self-pay | Admitting: Physical Medicine & Rehabilitation

## 2011-05-29 ENCOUNTER — Ambulatory Visit: Payer: Medicare Other | Admitting: Physical Therapy

## 2011-05-30 ENCOUNTER — Encounter: Payer: Self-pay | Admitting: Physical Medicine & Rehabilitation

## 2011-06-03 ENCOUNTER — Ambulatory Visit: Payer: Medicare Other | Admitting: Physical Therapy

## 2011-06-04 ENCOUNTER — Ambulatory Visit (INDEPENDENT_AMBULATORY_CARE_PROVIDER_SITE_OTHER): Payer: Medicare Other | Admitting: *Deleted

## 2011-06-04 DIAGNOSIS — Z8679 Personal history of other diseases of the circulatory system: Secondary | ICD-10-CM

## 2011-06-04 DIAGNOSIS — I4891 Unspecified atrial fibrillation: Secondary | ICD-10-CM

## 2011-06-04 DIAGNOSIS — Z7901 Long term (current) use of anticoagulants: Secondary | ICD-10-CM

## 2011-06-05 ENCOUNTER — Ambulatory Visit: Payer: Medicare Other | Admitting: Physical Therapy

## 2011-06-11 ENCOUNTER — Ambulatory Visit: Payer: Medicare Other | Attending: Physical Medicine & Rehabilitation | Admitting: Physical Therapy

## 2011-06-11 DIAGNOSIS — R269 Unspecified abnormalities of gait and mobility: Secondary | ICD-10-CM | POA: Insufficient documentation

## 2011-06-11 DIAGNOSIS — M6281 Muscle weakness (generalized): Secondary | ICD-10-CM | POA: Insufficient documentation

## 2011-06-11 DIAGNOSIS — R5381 Other malaise: Secondary | ICD-10-CM | POA: Insufficient documentation

## 2011-06-11 DIAGNOSIS — S78119A Complete traumatic amputation at level between unspecified hip and knee, initial encounter: Secondary | ICD-10-CM | POA: Insufficient documentation

## 2011-06-11 DIAGNOSIS — IMO0001 Reserved for inherently not codable concepts without codable children: Secondary | ICD-10-CM | POA: Insufficient documentation

## 2011-06-13 ENCOUNTER — Encounter: Payer: Medicare Other | Admitting: Physical Therapy

## 2011-06-13 ENCOUNTER — Ambulatory Visit: Payer: Medicare Other | Admitting: Physical Therapy

## 2011-06-14 ENCOUNTER — Encounter (HOSPITAL_BASED_OUTPATIENT_CLINIC_OR_DEPARTMENT_OTHER): Payer: Medicare Other

## 2011-06-17 ENCOUNTER — Ambulatory Visit: Payer: Medicare Other | Admitting: Physical Therapy

## 2011-06-19 ENCOUNTER — Ambulatory Visit: Payer: Medicare Other | Admitting: Physical Therapy

## 2011-06-21 ENCOUNTER — Ambulatory Visit (INDEPENDENT_AMBULATORY_CARE_PROVIDER_SITE_OTHER): Payer: Medicare Other | Admitting: Pharmacist

## 2011-06-21 DIAGNOSIS — Z8679 Personal history of other diseases of the circulatory system: Secondary | ICD-10-CM

## 2011-06-21 DIAGNOSIS — Z7901 Long term (current) use of anticoagulants: Secondary | ICD-10-CM

## 2011-06-21 DIAGNOSIS — I4891 Unspecified atrial fibrillation: Secondary | ICD-10-CM

## 2011-06-24 ENCOUNTER — Ambulatory Visit: Payer: Medicare Other | Admitting: Physical Therapy

## 2011-06-26 ENCOUNTER — Other Ambulatory Visit: Payer: Self-pay | Admitting: Cardiology

## 2011-06-27 ENCOUNTER — Ambulatory Visit: Payer: Medicare Other | Admitting: Physical Therapy

## 2011-07-01 ENCOUNTER — Ambulatory Visit: Payer: Medicare Other | Admitting: Physical Therapy

## 2011-07-01 ENCOUNTER — Ambulatory Visit (INDEPENDENT_AMBULATORY_CARE_PROVIDER_SITE_OTHER): Payer: Medicare Other

## 2011-07-01 DIAGNOSIS — Z7901 Long term (current) use of anticoagulants: Secondary | ICD-10-CM

## 2011-07-01 DIAGNOSIS — Z8679 Personal history of other diseases of the circulatory system: Secondary | ICD-10-CM

## 2011-07-01 DIAGNOSIS — I4891 Unspecified atrial fibrillation: Secondary | ICD-10-CM

## 2011-07-01 LAB — POCT INR: INR: 2.9

## 2011-07-04 ENCOUNTER — Ambulatory Visit: Payer: Medicare Other | Admitting: Physical Therapy

## 2011-07-08 ENCOUNTER — Ambulatory Visit: Payer: Medicare Other | Admitting: Physical Therapy

## 2011-07-10 ENCOUNTER — Encounter: Payer: Self-pay | Admitting: Physical Medicine & Rehabilitation

## 2011-07-11 ENCOUNTER — Ambulatory Visit: Payer: Medicare Other | Attending: Physical Medicine & Rehabilitation | Admitting: Physical Therapy

## 2011-07-11 DIAGNOSIS — R269 Unspecified abnormalities of gait and mobility: Secondary | ICD-10-CM | POA: Insufficient documentation

## 2011-07-11 DIAGNOSIS — S78119A Complete traumatic amputation at level between unspecified hip and knee, initial encounter: Secondary | ICD-10-CM | POA: Insufficient documentation

## 2011-07-11 DIAGNOSIS — R5381 Other malaise: Secondary | ICD-10-CM | POA: Insufficient documentation

## 2011-07-11 DIAGNOSIS — IMO0001 Reserved for inherently not codable concepts without codable children: Secondary | ICD-10-CM | POA: Insufficient documentation

## 2011-07-11 DIAGNOSIS — M6281 Muscle weakness (generalized): Secondary | ICD-10-CM | POA: Insufficient documentation

## 2011-07-15 ENCOUNTER — Ambulatory Visit (INDEPENDENT_AMBULATORY_CARE_PROVIDER_SITE_OTHER): Payer: Medicare Other | Admitting: Pharmacist

## 2011-07-15 ENCOUNTER — Ambulatory Visit: Payer: Medicare Other | Admitting: Physical Therapy

## 2011-07-15 DIAGNOSIS — Z8679 Personal history of other diseases of the circulatory system: Secondary | ICD-10-CM

## 2011-07-15 DIAGNOSIS — Z7901 Long term (current) use of anticoagulants: Secondary | ICD-10-CM

## 2011-07-15 DIAGNOSIS — I4891 Unspecified atrial fibrillation: Secondary | ICD-10-CM

## 2011-07-15 LAB — POCT INR: INR: 4.4

## 2011-07-18 ENCOUNTER — Ambulatory Visit: Payer: Medicare Other | Admitting: Physical Therapy

## 2011-07-23 ENCOUNTER — Ambulatory Visit: Payer: Medicare Other | Admitting: Physical Therapy

## 2011-07-23 ENCOUNTER — Encounter: Payer: Medicare Other | Admitting: Physical Therapy

## 2011-07-25 ENCOUNTER — Ambulatory Visit: Payer: Medicare Other | Admitting: Physical Therapy

## 2011-07-29 ENCOUNTER — Other Ambulatory Visit: Payer: Medicare Other

## 2011-07-30 ENCOUNTER — Ambulatory Visit: Payer: Medicare Other | Admitting: Physical Therapy

## 2011-07-30 ENCOUNTER — Encounter: Payer: Self-pay | Admitting: Neurosurgery

## 2011-07-31 ENCOUNTER — Ambulatory Visit (INDEPENDENT_AMBULATORY_CARE_PROVIDER_SITE_OTHER): Payer: Medicare Other | Admitting: Neurosurgery

## 2011-07-31 ENCOUNTER — Encounter: Payer: Self-pay | Admitting: Neurosurgery

## 2011-07-31 ENCOUNTER — Encounter (INDEPENDENT_AMBULATORY_CARE_PROVIDER_SITE_OTHER): Payer: Medicare Other | Admitting: *Deleted

## 2011-07-31 VITALS — BP 111/63 | HR 49 | Resp 16 | Ht 71.0 in | Wt 195.0 lb

## 2011-07-31 DIAGNOSIS — I714 Abdominal aortic aneurysm, without rupture: Secondary | ICD-10-CM

## 2011-07-31 NOTE — Progress Notes (Signed)
Addended by: Sharee Pimple on: 07/31/2011 12:37 PM   Modules accepted: Orders

## 2011-07-31 NOTE — Progress Notes (Signed)
[facility]  HISTORY AND PHYSICAL   CC: Six-month AAA duplex for surveillance Referring Physician: Brabham  History of Present Illness: 71 year old male patient of Dr. Estanislado Spire multiple medical problems. He is followed for known AAA with serial surveillance duplex. Patient reports no abdominal pain or back pains out of the ordinary. Again he has multiple medical problems which were managed several different physicians. 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  Past Medical History  Diagnosis Date  . HTN (hypertension)   . Obesity   . History of cholecystectomy   . CAD (coronary artery disease)     Status post CABG followed by redo in 1994 / nuclear 2007, old infarct, no ischemia  . Ejection fraction < 50%     EF  45%...echo 2004 /  EF 45%, echo, August, 2012  . Hyperlipidemia   . Cigarette smoker     He is now smoking cigars and needs to quit.  . 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 no replacement until leg is stable  . Prostate cancer     History of prostate cancer with a seed implant and this is stable.  . Abdominal aortic aneurysm     is stable, being followed  . Atrial fibrillation     Rate control / going in and out of atrial fib, Coumadin  . Volume overload   . Right bundle branch block     noted November 09, 2008.  . Warfarin anticoagulation     Atrial fibrillation  . Carotid artery disease     Doppler September 2 009, 40-50% LICA / Doppler October, 2010, 0-39% R. ICA 40-59% LICA  . Diabetes mellitus   . Myocardial infarction 1984  . Ulcer   . Peripheral vascular disease   . Preop cardiovascular exam     Surgical clearance for left AKA October, 2012    ROS: [x]  Positive   [ ]  Denies    General: [ ]  Weight loss, [ ]  Fever, [ ]  chills Neurologic: [ ]  Dizziness, [ ]  Blackouts, [ ]  Seizure [ ]  Stroke, [ ]  "Mini stroke", [ ]  Slurred speech, [ ]  Temporary blindness; [ ]  weakness in arms or legs,  [ ]  Hoarseness Cardiac: [ ]  Chest pain/pressure, [ ]  Shortness of breath at rest [ ]  Shortness of breath with exertion, [ ]  Atrial fibrillation or irregular heartbeat Vascular: [ ]  Pain in legs with walking, [ ]  Pain in legs at rest, [ ]  Pain in legs at night,  [ ]  Non-healing ulcer, [ ]  Blood clot in vein/DVT,   Pulmonary: [ ]  Home oxygen, [ ]  Productive cough, [ ]  Coughing up blood, [ ]  Asthma,  [ ]  Wheezing Musculoskeletal:  [ ]  Arthritis, [ ]  Low back pain, [ ]  Joint pain Hematologic: [ ]  Easy Bruising, [ ]  Anemia; [ ]  Hepatitis Gastrointestinal: [ ]  Blood in stool, [ ]  Gastroesophageal Reflux/heartburn, [ ]  Trouble swallowing Urinary: [ ]  chronic Kidney disease, [ ]  on HD - [ ]  MWF or [ ]  TTHS, [ ]  Burning with urination, [ ]  Difficulty urinating Skin: [ ]  Rashes, [ ]  Wounds Psychological: [ ]  Anxiety, [ ]  Depression   Social History History  Substance Use Topics  . Smoking status: Current Some Day Smoker -- 0.5 packs/day for 30 years    Types: Cigars, Cigarettes  . Smokeless tobacco: Not on file  . Alcohol Use: Yes     occasionally  Family History Family History  Problem Relation Age of Onset  . Stroke Father 19  . Diabetes Other     Allergies  Allergen Reactions  . Amoxicillin     REACTION: swelling and a rash  . Morphine Nausea And Vomiting    Current Outpatient Prescriptions  Medication Sig Dispense Refill  . aspirin 81 MG tablet Take 81 mg by mouth daily.       Marland Kitchen docusate sodium (COLACE) 100 MG capsule Take 100-200 mg by mouth daily as needed. For constipation      . furosemide (LASIX) 40 MG tablet Take 40 mg by mouth every morning. Take one (1) tablet by mouth daily.      Marland Kitchen gabapentin (NEURONTIN) 100 MG capsule Take 100 mg by mouth 3 (three) times daily.        . magnesium gluconate (MAGONATE) 500 MG tablet Take 500 mg by mouth daily.       . metFORMIN (GLUCOPHAGE) 850 MG tablet Take 850 mg by mouth every morning.       . potassium chloride SA  (K-DUR,KLOR-CON) 20 MEQ tablet Take 40 mEq by mouth daily.       . simvastatin (ZOCOR) 40 MG tablet TAKE 1 TABLET AT BEDTIME  90 tablet  1  . spironolactone (ALDACTONE) 25 MG tablet Take 25 mg by mouth daily. Take one (1) tablet by mouth two (2) times a day.      . warfarin (COUMADIN) 2.5 MG tablet TAKE AS DIRECTED BY COUMADIN CLINIC  120 tablet  1  . acetaminophen (TYLENOL) 325 MG tablet Take 325-650 mg by mouth every 4 (four) hours as needed. For pain        . clonazePAM (KLONOPIN) 1 MG tablet Take 0.5-1 mg by mouth 2 (two) times daily as needed. For anxiety      . cyclobenzaprine (FLEXERIL) 10 MG tablet Take 10 mg by mouth 3 (three) times daily as needed.        . diphenhydrAMINE (BENADRYL) 25 mg capsule Take 25 mg by mouth every 4 (four) hours as needed. For itching        . ENSURE (ENSURE) Take 237 mLs by mouth 2 (two) times daily.       . ferrous sulfate 325 (65 FE) MG tablet Take 325 mg by mouth 3 (three) times daily with meals.        . methocarbamol (ROBAXIN) 500 MG tablet Take 500 mg by mouth every 6 (six) hours as needed. For spasms        . metolazone (ZAROXOLYN) 2.5 MG tablet Take 2.5 mg by mouth daily.       . minocycline (MINOCIN,DYNACIN) 100 MG capsule Take 1 tablet by mouth Twice daily.      Marland Kitchen oxycodone (OXY-IR) 5 MG capsule Take 10 mg by mouth every 4 (four) hours as needed. For pain      . polyethylene glycol (MIRALAX / GLYCOLAX) packet Take 17 g by mouth daily as needed. For constipation        . warfarin (COUMADIN) 4 MG tablet Take 4 mg by mouth daily.          Physical Examination  Filed Vitals:   07/31/11 1022  BP: 111/63  Pulse: 49  Resp: 16    Body mass index is 27.20 kg/(m^2).  General:  WDWN in NAD Gait: Normal HEENT: WNL Eyes: Pupils equal Pulmonary: normal non-labored breathing , without Rales, rhonchi,  wheezing Cardiac: RRR, without  Murmurs, rubs or gallops; No carotid  bruits Abdomen: soft, NT, no masses Skin: no rashes, ulcers noted Vascular  Exam/Pulses: Right lower show a pulses are very dampened PT heard with Doppler only, 2+ radial pulses bilaterally, pulsatile abdominal mass palpated without tenderness.  Extremities without ischemic changes, no Gangrene , no cellulitis; no open wounds;  Musculoskeletal: no muscle wasting or atrophy  Neurologic: A&O X 3; Appropriate Affect ; SENSATION: normal; MOTOR FUNCTION:  moving all extremities equally. Speech is fluent/normal  Non-Invasive Vascular Imaging: AAA duplex shows a measurement of 5.13 in the mid, transverse not seen due to bowel gas.  ASSESSMENT/PLAN: Asymptomatic AAA that stable, patient will followup in 6 months for repeat AAA duplex, his questions were encouraged and answered he is in agreement with this plan. Next  Lauree Chandler ANP  Clinic M.D.: Edilia Bo

## 2011-08-01 ENCOUNTER — Ambulatory Visit (INDEPENDENT_AMBULATORY_CARE_PROVIDER_SITE_OTHER): Payer: Medicare Other | Admitting: *Deleted

## 2011-08-01 ENCOUNTER — Ambulatory Visit: Payer: Medicare Other | Admitting: Physical Therapy

## 2011-08-01 DIAGNOSIS — Z7901 Long term (current) use of anticoagulants: Secondary | ICD-10-CM

## 2011-08-01 DIAGNOSIS — I4891 Unspecified atrial fibrillation: Secondary | ICD-10-CM

## 2011-08-01 DIAGNOSIS — Z8679 Personal history of other diseases of the circulatory system: Secondary | ICD-10-CM

## 2011-08-01 LAB — POCT INR: INR: 3

## 2011-08-06 ENCOUNTER — Ambulatory Visit: Payer: Medicare Other | Admitting: Physical Therapy

## 2011-08-07 ENCOUNTER — Ambulatory Visit: Payer: Medicare Other | Admitting: Physical Therapy

## 2011-08-08 ENCOUNTER — Encounter: Payer: Medicare Other | Admitting: Physical Therapy

## 2011-08-13 ENCOUNTER — Ambulatory Visit: Payer: Medicare Other | Attending: Physical Medicine & Rehabilitation | Admitting: Physical Therapy

## 2011-08-13 DIAGNOSIS — S78119A Complete traumatic amputation at level between unspecified hip and knee, initial encounter: Secondary | ICD-10-CM | POA: Insufficient documentation

## 2011-08-13 DIAGNOSIS — M6281 Muscle weakness (generalized): Secondary | ICD-10-CM | POA: Insufficient documentation

## 2011-08-13 DIAGNOSIS — R5381 Other malaise: Secondary | ICD-10-CM | POA: Insufficient documentation

## 2011-08-13 DIAGNOSIS — IMO0001 Reserved for inherently not codable concepts without codable children: Secondary | ICD-10-CM | POA: Insufficient documentation

## 2011-08-13 DIAGNOSIS — R269 Unspecified abnormalities of gait and mobility: Secondary | ICD-10-CM | POA: Insufficient documentation

## 2011-08-15 ENCOUNTER — Ambulatory Visit: Payer: Medicare Other | Admitting: Physical Therapy

## 2011-08-22 ENCOUNTER — Ambulatory Visit (INDEPENDENT_AMBULATORY_CARE_PROVIDER_SITE_OTHER): Payer: Medicare Other | Admitting: Pharmacist

## 2011-08-22 DIAGNOSIS — I4891 Unspecified atrial fibrillation: Secondary | ICD-10-CM

## 2011-08-22 DIAGNOSIS — Z7901 Long term (current) use of anticoagulants: Secondary | ICD-10-CM

## 2011-08-22 DIAGNOSIS — Z8679 Personal history of other diseases of the circulatory system: Secondary | ICD-10-CM

## 2011-08-26 NOTE — Procedures (Unsigned)
DUPLEX ULTRASOUND OF ABDOMINAL AORTA  INDICATION:  Followup AAA.  HISTORY: Diabetes:  Yes Cardiac:  Yes Hypertension:  Yes Smoking:  Yes Connective Tissue Disorder: Family History:  No Previous Surgery:  No  DUPLEX EXAM:         AP (cm)                   TRANSVERSE (cm) Proximal             2.31 cm Mid                  5.13 cm Distal               Not visualized Right Iliac          Not visualized Left Iliac           Not visualized  PREVIOUS:  Date: 02/04/2011  AP:  5.1  TRANSVERSE:  5.07  IMPRESSION: 1. AAA measuring 5.13 cm AP on today's exam. 2. Some areas are poorly visualized due to overlying bowel gas. 3. Appears stable compared to the previous exam.  ___________________________________________ V. Charlena Cross, MD  EM/MEDQ  D:  07/31/2011  T:  07/31/2011  Job:  [id number]

## 2011-09-16 ENCOUNTER — Ambulatory Visit (INDEPENDENT_AMBULATORY_CARE_PROVIDER_SITE_OTHER): Payer: Medicare Other

## 2011-09-16 DIAGNOSIS — Z7901 Long term (current) use of anticoagulants: Secondary | ICD-10-CM

## 2011-09-16 DIAGNOSIS — I4891 Unspecified atrial fibrillation: Secondary | ICD-10-CM

## 2011-09-16 DIAGNOSIS — Z8679 Personal history of other diseases of the circulatory system: Secondary | ICD-10-CM

## 2011-09-16 LAB — POCT INR: INR: 3.8

## 2011-10-07 ENCOUNTER — Ambulatory Visit (INDEPENDENT_AMBULATORY_CARE_PROVIDER_SITE_OTHER): Payer: Medicare Other | Admitting: *Deleted

## 2011-10-07 DIAGNOSIS — Z8679 Personal history of other diseases of the circulatory system: Secondary | ICD-10-CM

## 2011-10-07 DIAGNOSIS — I4891 Unspecified atrial fibrillation: Secondary | ICD-10-CM

## 2011-10-07 DIAGNOSIS — Z7901 Long term (current) use of anticoagulants: Secondary | ICD-10-CM

## 2011-10-30 ENCOUNTER — Encounter: Payer: Self-pay | Admitting: Cardiology

## 2011-11-04 ENCOUNTER — Ambulatory Visit (INDEPENDENT_AMBULATORY_CARE_PROVIDER_SITE_OTHER): Payer: Medicare Other | Admitting: Pharmacist

## 2011-11-04 DIAGNOSIS — Z7901 Long term (current) use of anticoagulants: Secondary | ICD-10-CM

## 2011-11-04 DIAGNOSIS — I4891 Unspecified atrial fibrillation: Secondary | ICD-10-CM

## 2011-11-04 DIAGNOSIS — Z8679 Personal history of other diseases of the circulatory system: Secondary | ICD-10-CM

## 2011-12-03 ENCOUNTER — Ambulatory Visit (INDEPENDENT_AMBULATORY_CARE_PROVIDER_SITE_OTHER): Payer: Medicare Other

## 2011-12-03 DIAGNOSIS — Z7901 Long term (current) use of anticoagulants: Secondary | ICD-10-CM

## 2011-12-03 DIAGNOSIS — Z8679 Personal history of other diseases of the circulatory system: Secondary | ICD-10-CM

## 2011-12-03 DIAGNOSIS — I4891 Unspecified atrial fibrillation: Secondary | ICD-10-CM

## 2012-01-07 ENCOUNTER — Ambulatory Visit (INDEPENDENT_AMBULATORY_CARE_PROVIDER_SITE_OTHER): Payer: Medicare Other

## 2012-01-07 DIAGNOSIS — I4891 Unspecified atrial fibrillation: Secondary | ICD-10-CM

## 2012-01-07 DIAGNOSIS — Z7901 Long term (current) use of anticoagulants: Secondary | ICD-10-CM

## 2012-01-07 DIAGNOSIS — Z8679 Personal history of other diseases of the circulatory system: Secondary | ICD-10-CM

## 2012-01-07 LAB — POCT INR: INR: 3.2

## 2012-01-29 ENCOUNTER — Other Ambulatory Visit: Payer: Self-pay

## 2012-01-29 ENCOUNTER — Ambulatory Visit: Payer: Medicare Other | Admitting: Neurosurgery

## 2012-01-29 MED ORDER — SIMVASTATIN 40 MG PO TABS: 40.0000 mg | ORAL_TABLET | Freq: Every day | ORAL | Status: DC

## 2012-02-05 ENCOUNTER — Ambulatory Visit: Payer: Medicare Other | Admitting: Neurosurgery

## 2012-02-07 ENCOUNTER — Ambulatory Visit (INDEPENDENT_AMBULATORY_CARE_PROVIDER_SITE_OTHER): Payer: Medicare Other | Admitting: *Deleted

## 2012-02-07 DIAGNOSIS — I4891 Unspecified atrial fibrillation: Secondary | ICD-10-CM

## 2012-02-07 DIAGNOSIS — Z8679 Personal history of other diseases of the circulatory system: Secondary | ICD-10-CM

## 2012-02-07 DIAGNOSIS — Z7901 Long term (current) use of anticoagulants: Secondary | ICD-10-CM

## 2012-02-14 ENCOUNTER — Encounter: Payer: Self-pay | Admitting: Neurosurgery

## 2012-02-17 ENCOUNTER — Ambulatory Visit: Payer: Medicare Other | Admitting: Neurosurgery

## 2012-02-18 ENCOUNTER — Encounter: Payer: Self-pay | Admitting: Neurosurgery

## 2012-02-18 ENCOUNTER — Encounter (INDEPENDENT_AMBULATORY_CARE_PROVIDER_SITE_OTHER): Payer: Medicare Other | Admitting: *Deleted

## 2012-02-18 ENCOUNTER — Ambulatory Visit (INDEPENDENT_AMBULATORY_CARE_PROVIDER_SITE_OTHER): Payer: Medicare Other | Admitting: Neurosurgery

## 2012-02-18 VITALS — BP 103/63 | HR 43 | Resp 16 | Ht 71.0 in | Wt 217.0 lb

## 2012-02-18 DIAGNOSIS — Z48812 Encounter for surgical aftercare following surgery on the circulatory system: Secondary | ICD-10-CM

## 2012-02-18 DIAGNOSIS — I714 Abdominal aortic aneurysm, without rupture: Secondary | ICD-10-CM

## 2012-02-18 NOTE — Progress Notes (Signed)
[facility]  AAA/Carotid Office Note  CC: AAA surveillance Referring Physician:  Dr. Myra Gianotti   History of Present Illness:  71 year old male patient of Dr. Myra Gianotti followed for known AAA with no history of intervention. The patient denies any unusual abdominal or back pain. The patient denies any new medical diagnoses or recent surgeries.   Past Medical History  Diagnosis Date  . HTN (hypertension)   . Obesity   . History of cholecystectomy   . CAD (coronary artery disease)     Status post CABG followed by redo in 1994 / nuclear 2007, old infarct, no ischemia  . Ejection fraction < 50%     EF  45%...echo 2004 /  EF 45%, echo, August, 2012  . Hyperlipidemia   . Cigarette smoker     He is now smoking cigars and needs to quit.  . 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 no replacement until leg is stable  . Prostate cancer     History of prostate cancer with a seed implant and this is stable.  . Abdominal aortic aneurysm     is stable, being followed  . Atrial fibrillation     Rate control / going in and out of atrial fib, Coumadin  . Volume overload   . Right bundle branch block     noted November 09, 2008.  . Warfarin anticoagulation     Atrial fibrillation  . Carotid artery disease     Doppler September 2 009, 40-50% LICA / Doppler October, 2010, 0-39% R. ICA 40-59% LICA  . Diabetes mellitus   . Myocardial infarction 1984  . Ulcer   . Peripheral vascular disease   . Preop cardiovascular exam     Surgical clearance for left AKA October, 2012    ROS: [x]  Positive   [ ]  Denies    General: [ ]  Weight loss, [ ]  Fever, [ ]  chills Neurologic: [ ]  Dizziness, [ ]  Blackouts, [ ]  Seizure [ ]  Stroke, [ ]  "Mini stroke", [ ]  Slurred speech, [ ]  Temporary blindness; [ ]  weakness in arms or legs, [ ]  Hoarseness Cardiac: [ ]  Chest pain/pressure, [ ]  Shortness of breath at rest [ ]  Shortness of breath with exertion, [ ]  Atrial  fibrillation or irregular heartbeat Vascular: [ ]  Pain in legs with walking, [ ]  Pain in legs at rest, [ ]  Pain in legs at night,  [ ]  Non-healing ulcer, [ ]  Blood clot in vein/DVT,   Pulmonary: [ ]  Home oxygen, [ ]  Productive cough, [ ]  Coughing up blood, [ ]  Asthma,  [ ]  Wheezing Musculoskeletal:  [ ]  Arthritis, [ ]  Low back pain, [ ]  Joint pain Hematologic: [ ]  Easy Bruising, [ ]  Anemia; [ ]  Hepatitis Gastrointestinal: [ ]  Blood in stool, [ ]  Gastroesophageal Reflux/heartburn, [ ]  Trouble swallowing Urinary: [ ]  chronic Kidney disease, [ ]  on HD - [ ]  MWF or [ ]  TTHS, [ ]  Burning with urination, [ ]  Difficulty urinating Skin: [ ]  Rashes, [ ]  Wounds Psychological: [ ]  Anxiety, [ ]  Depression   Social History History  Substance Use Topics  . Smoking status: Current Some Day Smoker -- 0.5 packs/day for 30 years    Types: Cigars, Cigarettes  . Smokeless tobacco: Never Used  . Alcohol Use: Yes     Comment: occasionally    Family History Family History  Problem Relation Age of Onset  . Stroke Father 64  . Diabetes  Other     Allergies  Allergen Reactions  . Amoxicillin     REACTION: swelling and a rash  . Morphine Nausea And Vomiting    Current Outpatient Prescriptions  Medication Sig Dispense Refill  . aspirin 81 MG tablet Take 81 mg by mouth daily.       Marland Kitchen docusate sodium (COLACE) 100 MG capsule Take 100-200 mg by mouth daily as needed. For constipation      . ENSURE (ENSURE) Take 237 mLs by mouth 2 (two) times daily.       . furosemide (LASIX) 40 MG tablet Take 40 mg by mouth every morning. Take one (1) tablet by mouth daily.      Marland Kitchen gabapentin (NEURONTIN) 100 MG capsule Take 100 mg by mouth 3 (three) times daily.        . magnesium gluconate (MAGONATE) 500 MG tablet Take 500 mg by mouth daily.       . metFORMIN (GLUCOPHAGE) 850 MG tablet Take 850 mg by mouth every morning.       . metolazone (ZAROXOLYN) 2.5 MG tablet Take 2.5 mg by mouth daily.       . potassium chloride  SA (K-DUR,KLOR-CON) 20 MEQ tablet Take 40 mEq by mouth daily.       . simvastatin (ZOCOR) 40 MG tablet Take 1 tablet (40 mg total) by mouth at bedtime.  90 tablet  0  . spironolactone (ALDACTONE) 25 MG tablet Take 25 mg by mouth daily. Take one (1) tablet by mouth two (2) times a day.      . warfarin (COUMADIN) 2.5 MG tablet TAKE AS DIRECTED BY COUMADIN CLINIC  120 tablet  1    Physical Examination  Filed Vitals:   02/18/12 1030  BP: 103/63  Pulse: 43  Resp: 16    Body mass index is 30.27 kg/(m^2).  General:  WDWN in NAD Gait: Normal HEENT: WNL Eyes: Pupils equal Pulmonary: normal non-labored breathing , without Rales, rhonchi,  wheezing Cardiac: RRR, without  Murmurs, rubs or gallops; Abdomen: soft, NT, no masses Skin: no rashes, ulcers noted  Vascular Exam Pulses: 3+ radial pulses bilaterally, palpable femoral pulses bilaterally, there is a palpable nontender abdominal pulsatile mass Carotid bruits: carotid pulses to auscultation no bruits are heard Extremities without ischemic changes, no Gangrene , no cellulitis; no open wounds;  Musculoskeletal: no muscle wasting or atrophy   Neurologic: A&O X 3; Appropriate Affect ; SENSATION: normal; MOTOR FUNCTION:  moving all extremities equally. Speech is fluent/normal  Non-Invasive Vascular Imaging Maximum AAA measurement today is 5.3 AP by 5.0 transverse, he was 5.13 in May 2013. I did discuss this with Dr. early who agrees the patient should have a CT to evaluate the AAA and return to see Dr. Myra Gianotti.  ASSESSMENT/PLAN: Asymptomatic AAA that has increased to maximum diameter of 5.3. The patient will have a CT of the abdomen pelvis and return to see Dr. Myra Gianotti after January 2014 per the patient's request. The patient's followup will be pending that appointment. The patient's questions were encouraged and answered, he is in agreement with this plan.  Lauree Chandler ANP   Clinic MD: Early

## 2012-02-19 NOTE — Addendum Note (Signed)
Addended by: Sharee Pimple on: 02/19/2012 02:52 PM   Modules accepted: Orders

## 2012-02-20 ENCOUNTER — Other Ambulatory Visit: Payer: Self-pay | Admitting: Physical Medicine & Rehabilitation

## 2012-02-21 ENCOUNTER — Telehealth: Payer: Self-pay | Admitting: Cardiology

## 2012-02-21 NOTE — Telephone Encounter (Signed)
Error

## 2012-02-22 ENCOUNTER — Encounter: Payer: Self-pay | Admitting: Cardiology

## 2012-02-24 ENCOUNTER — Encounter: Payer: Self-pay | Admitting: Cardiology

## 2012-02-24 ENCOUNTER — Ambulatory Visit (INDEPENDENT_AMBULATORY_CARE_PROVIDER_SITE_OTHER): Payer: Medicare Other | Admitting: Cardiology

## 2012-02-24 VITALS — BP 118/50 | HR 49 | Ht 71.0 in | Wt 217.0 lb

## 2012-02-24 DIAGNOSIS — I2510 Atherosclerotic heart disease of native coronary artery without angina pectoris: Secondary | ICD-10-CM

## 2012-02-24 DIAGNOSIS — I4891 Unspecified atrial fibrillation: Secondary | ICD-10-CM

## 2012-02-24 DIAGNOSIS — R0989 Other specified symptoms and signs involving the circulatory and respiratory systems: Secondary | ICD-10-CM

## 2012-02-24 DIAGNOSIS — I10 Essential (primary) hypertension: Secondary | ICD-10-CM

## 2012-02-24 DIAGNOSIS — I779 Disorder of arteries and arterioles, unspecified: Secondary | ICD-10-CM

## 2012-02-24 DIAGNOSIS — R9430 Abnormal result of cardiovascular function study, unspecified: Secondary | ICD-10-CM

## 2012-02-24 DIAGNOSIS — E785 Hyperlipidemia, unspecified: Secondary | ICD-10-CM

## 2012-02-24 DIAGNOSIS — I714 Abdominal aortic aneurysm, without rupture: Secondary | ICD-10-CM

## 2012-02-24 DIAGNOSIS — E8770 Fluid overload, unspecified: Secondary | ICD-10-CM

## 2012-02-24 DIAGNOSIS — E8779 Other fluid overload: Secondary | ICD-10-CM

## 2012-02-24 DIAGNOSIS — Z7901 Long term (current) use of anticoagulants: Secondary | ICD-10-CM

## 2012-02-24 NOTE — Assessment & Plan Note (Signed)
Blood pressures control. No change in therapy. 

## 2012-02-24 NOTE — Assessment & Plan Note (Signed)
The patient has known carotid disease with 40-59% bilateral disease. Last Doppler was October, 2012. We will look into the scheduling of this years.  As part of today's evaluation I have spent greater than 25 minutes reviewing all data with the patient area I spent more than half of this time speaking directly with him and his family about all of the issues that I have outlined above.

## 2012-02-24 NOTE — Progress Notes (Signed)
HPI  Patient is seen back for followup coronary disease, atrial fibrillation, left ventricular dysfunction, peripheral vascular disease. I saw him last October, 2012. He has atrial fib with an old right bundle branch block. I have cleared him to have his leg amputation. This was done and he is recovered over time. His abdominal aortic aneurysm is being is being followed carefully. It has been increasing in size. He may be close to the time when he will need a procedure. At that time we will have to decide about any further cardiac testing.His last exercise test was approximately 5 years ago. We will need to consider doing this with her pharmacologic study if he needs surgery.  Allergies  Allergen Reactions  . Amoxicillin     REACTION: swelling and a rash  . Morphine Nausea And Vomiting    Current Outpatient Prescriptions  Medication Sig Dispense Refill  . aspirin 81 MG tablet Take 81 mg by mouth daily.       Marland Kitchen docusate sodium (COLACE) 100 MG capsule Take 100-200 mg by mouth daily as needed. For constipation      . ENSURE (ENSURE) Take 237 mLs by mouth 2 (two) times daily.       . furosemide (LASIX) 40 MG tablet Take 40 mg by mouth every morning. Take one (1) tablet by mouth daily.      Marland Kitchen gabapentin (NEURONTIN) 100 MG capsule Take 100 mg by mouth 3 (three) times daily.        . magnesium gluconate (MAGONATE) 500 MG tablet Take 500 mg by mouth daily.       . metFORMIN (GLUCOPHAGE) 850 MG tablet Take 850 mg by mouth every morning.       . metolazone (ZAROXOLYN) 2.5 MG tablet Take 2.5 mg by mouth daily.       . potassium chloride SA (K-DUR,KLOR-CON) 20 MEQ tablet Take 40 mEq by mouth daily.       . simvastatin (ZOCOR) 40 MG tablet Take 1 tablet (40 mg total) by mouth at bedtime.  90 tablet  0  . spironolactone (ALDACTONE) 25 MG tablet Take 25 mg by mouth daily. Take one (1) tablet by mouth two (2) times a day.      . warfarin (COUMADIN) 2.5 MG tablet TAKE AS DIRECTED BY COUMADIN CLINIC  120  tablet  1    History   Social History  . Marital Status: Married    Spouse Name: N/A    Number of Children: N/A  . Years of Education: N/A   Occupational History  . Not on file.   Social History Main Topics  . Smoking status: Current Some Day Smoker -- 0.5 packs/day for 30 years    Types: Cigars, Cigarettes  . Smokeless tobacco: Never Used  . Alcohol Use: Yes     Comment: occasionally  . Drug Use: No  . Sexually Active: Not on file   Other Topics Concern  . Not on file   Social History Narrative  . No narrative on file    Family History  Problem Relation Age of Onset  . Stroke Father 42  . Diabetes Other     Past Medical History  Diagnosis Date  . HTN (hypertension)   . Obesity   . History of cholecystectomy   . CAD (coronary artery disease)     Status post CABG followed by redo in 1994 / nuclear 2007, old infarct, no ischemia  . Ejection fraction < 50%     EF  45%...echo 2004 /  EF 45%, echo, August, 2012  . Hyperlipidemia   . Cigarette smoker     He is now smoking cigars and needs to quit.  . 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 no replacement until leg is stable  . Prostate cancer     History of prostate cancer with a seed implant and this is stable.  . Abdominal aortic aneurysm     is stable, being followed  . Atrial fibrillation     Rate control / going in and out of atrial fib, Coumadin  . Volume overload   . Right bundle branch block     noted November 09, 2008.  . Warfarin anticoagulation     Atrial fibrillation  . Carotid artery disease     Doppler September 2 009, 40-50% LICA / Doppler October, 2010, 0-39% R. ICA 40-59% LICA  . Diabetes mellitus   . Myocardial infarction 1984  . Ulcer   . Peripheral vascular disease   . Preop cardiovascular exam     Surgical clearance for left AKA October, 2012    Past Surgical History  Procedure Date  . Total knee arthroplasty   . Coronary artery bypass graft 1994    x2    . Anal fissure repair   . Pr vein bypass graft,[id number]   . Leg amputation above knee Oct. 23, 2012    Left Leg AKA    Patient Active Problem List  Diagnosis  . DYSLIPIDEMIA  . FLUID OVERLOAD  . RBBB  . AAA  . PERIPHERAL VASCULAR DISEASE  . CELLULITIS AND ABSCESS OF LEG EXCEPT FOOT  . Pyogenic arthritis, lower leg  . TOBACCO ABUSE, HX OF  . STREPTOCOCCUS INFECTION [facility]  . Encounter for long-term (current) use of anticoagulants  . HTN (hypertension)  . Obesity  . CAD (coronary artery disease)  . Hyperlipidemia  . Cigarette smoker  . Atrial fibrillation  . Volume overload  . Right bundle branch block  . Warfarin anticoagulation  . Carotid artery disease  . 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 Gram-negative infection  . Ejection fraction < 50%  . Preop cardiovascular exam    ROS   Patient denies fever, chills, headache, sweats, rash, change in vision, change in hearing, chest pain, cough, nausea vomiting, urinary symptoms. All other systems are reviewed and are negative.  PHYSICAL EXAM  Patient is overweight. He stable. He is in a wheelchair. He is with his family member. He is oriented to person time and place. Affect is normal. There is no jugulovenous distention. There no carotid bruits. Lungs are clear. Respiratory effort is nonlabored. Cardiac exam reveals S1 and S2. There no clicks or significant murmurs. The abdomen is soft. He has 1+ peripheral edema in his right leg. His left leg is above the knee amputation. There no skin rashes.  Filed Vitals:   02/24/12 1444  BP: 118/50  Pulse: 49  Height: 5\' 11"  (1.803 m)  Weight: 217 lb (98.431 kg)   EKG is done today and reviewed by me. He has atrial fibrillation with a slow response. This is old. He has old right bundle branch block.  ASSESSMENT & PLAN

## 2012-02-24 NOTE — Patient Instructions (Addendum)
Your physician wants you to follow-up in: 6 months.   You will receive a reminder letter in the mail two months in advance. If you don't receive a letter, please call our office to schedule the follow-up appointment.  Your physician has requested that you have a carotid duplex. This test is an ultrasound of the carotid arteries in your neck. It looks at blood flow through these arteries that supply the brain with blood. Allow one hour for this exam. There are no restrictions or special instructions.  Your physician has recommended you make the following change in your medication: STOP your ASPIRIN

## 2012-02-24 NOTE — Assessment & Plan Note (Signed)
Ejection fraction was 45% in August, 2012. He does not need follow up echo at this time.

## 2012-02-24 NOTE — Assessment & Plan Note (Signed)
He has chronic edema in his right leg. This is baseline. No significant change.

## 2012-02-24 NOTE — Assessment & Plan Note (Signed)
The patient continues on Coumadin. I had a long and careful discussion with the patient and his family about Coumadin and the newer novel agents. I feel it is wise for him to remain on Coumadin at this time. In the future we can consider other approaches particularly if this is reassessed after he has a possible intervention to his abdominal aortic aneurysm.

## 2012-02-24 NOTE — Assessment & Plan Note (Signed)
I have recent labs from his primary team. His lipids are under very good control.

## 2012-02-24 NOTE — Assessment & Plan Note (Signed)
Patient has chronic atrial fibrillation. He is not on any meds to control rate. His rate is relatively slow. There is no indication for pacemaker.

## 2012-02-24 NOTE — Assessment & Plan Note (Signed)
His abdominal aortic aneurysm is being very carefully watched. He is to have a CT scan in the near future and followup with Dr. Myra Gianotti.

## 2012-02-24 NOTE — Assessment & Plan Note (Signed)
Coronary disease is stable. We may need to consider followup stress test in and near future.

## 2012-02-28 ENCOUNTER — Ambulatory Visit (INDEPENDENT_AMBULATORY_CARE_PROVIDER_SITE_OTHER): Payer: Medicare Other | Admitting: *Deleted

## 2012-02-28 DIAGNOSIS — Z7901 Long term (current) use of anticoagulants: Secondary | ICD-10-CM

## 2012-02-28 DIAGNOSIS — I4891 Unspecified atrial fibrillation: Secondary | ICD-10-CM

## 2012-02-28 DIAGNOSIS — Z8679 Personal history of other diseases of the circulatory system: Secondary | ICD-10-CM

## 2012-02-28 LAB — POCT INR: INR: 3.6

## 2012-03-06 ENCOUNTER — Encounter (INDEPENDENT_AMBULATORY_CARE_PROVIDER_SITE_OTHER): Payer: Medicare Other

## 2012-03-06 DIAGNOSIS — I6529 Occlusion and stenosis of unspecified carotid artery: Secondary | ICD-10-CM

## 2012-03-09 ENCOUNTER — Other Ambulatory Visit: Payer: Self-pay | Admitting: Surgery

## 2012-03-15 ENCOUNTER — Encounter: Payer: Self-pay | Admitting: Cardiology

## 2012-03-18 ENCOUNTER — Ambulatory Visit (INDEPENDENT_AMBULATORY_CARE_PROVIDER_SITE_OTHER): Payer: Medicare Other | Admitting: *Deleted

## 2012-03-18 DIAGNOSIS — Z7901 Long term (current) use of anticoagulants: Secondary | ICD-10-CM

## 2012-03-18 DIAGNOSIS — Z8679 Personal history of other diseases of the circulatory system: Secondary | ICD-10-CM

## 2012-03-18 DIAGNOSIS — I4891 Unspecified atrial fibrillation: Secondary | ICD-10-CM

## 2012-03-20 ENCOUNTER — Encounter: Payer: Self-pay | Admitting: Surgery

## 2012-03-20 ENCOUNTER — Other Ambulatory Visit: Payer: Self-pay | Admitting: Surgery

## 2012-03-23 ENCOUNTER — Ambulatory Visit (INDEPENDENT_AMBULATORY_CARE_PROVIDER_SITE_OTHER): Payer: Medicare Other | Admitting: Surgery

## 2012-03-23 ENCOUNTER — Encounter: Payer: Self-pay | Admitting: Surgery

## 2012-03-23 ENCOUNTER — Ambulatory Visit
Admission: RE | Admit: 2012-03-23 | Discharge: 2012-03-23 | Disposition: A | Discharge status: Home / Self Care | Payer: Medicare Other | Source: Ambulatory Visit | Attending: Neurosurgery | Admitting: Neurosurgery

## 2012-03-23 VITALS — BP 109/66 | HR 59 | Resp 16 | Ht 71.0 in | Wt 217.0 lb

## 2012-03-23 DIAGNOSIS — Z48812 Encounter for surgical aftercare following surgery on the circulatory system: Secondary | ICD-10-CM

## 2012-03-23 DIAGNOSIS — I714 Abdominal aortic aneurysm, without rupture: Secondary | ICD-10-CM

## 2012-03-23 MED ORDER — IOHEXOL 350 MG/ML SOLN
80.0000 mL | Freq: Once | INTRAVENOUS | Status: AC | PRN
  Administered 2012-03-23: 80 mL via INTRAVENOUS

## 2012-03-23 NOTE — Progress Notes (Signed)
Vascular and Vein Specialist of [facility]   Patient name: Derek Frye MRN: [id number] DOB: January 06, 1941 Sex: male     Chief Complaint  Patient presents with  . AAA    F/up with CT scan today from [facility]    HISTORY OF PRESENT ILLNESS: This is a patient that I have followed for lower extremity arterial insufficiency as well as an abdominal aortic aneurysm. I inherited him from Dr. Madilyn Fireman. As recently as May 2013, his aneurysm measured 5.1 mm. In December and ultrasound suggested that it had increased in size, and therefore a CT scan was ordered. He is back today to discuss those results. He reports no bowel pain.  The patient was recently seen by Dr. Myrtis Ser, his cardiologist. Carotid Dopplers revealed 40-59% stenosis bilaterally. The patient continues to be on Coumadin for atrial fibrillation.  The patient has undergone left leg a dictation in 2012 secondary to a nonhealing wound. He is medically managed for his hypertension and hyperlipidemia. He is on a statin. He also suffers from diabetes.  Past Medical History  Diagnosis Date  . HTN (hypertension)   . Obesity   . History of cholecystectomy   . CAD (coronary artery disease)     Status post CABG followed by redo in 1994 / nuclear 2007, old infarct, no ischemia  . Ejection fraction < 50%     EF  45%...echo 2004 /  EF 45%, echo, August, 2012  . Hyperlipidemia   . Cigarette smoker     He is now smoking cigars and needs to quit.  . 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 no replacement until leg is stable  . Prostate cancer     History of prostate cancer with a seed implant and this is stable.  . Abdominal aortic aneurysm     is stable, being followed  . Atrial fibrillation     Rate control / going in and out of atrial fib, Coumadin  . Volume overload   . Right bundle branch block     noted November 09, 2008.  . Warfarin anticoagulation     Atrial fibrillation  . Carotid artery disease     Doppler  September 2 009, 40-50% LICA / Doppler October, 2010, 0-39% R. ICA 40-59% LICA  . Diabetes mellitus   . Myocardial infarction 1984  . Ulcer   . Peripheral vascular disease   . Preop cardiovascular exam     Surgical clearance for left AKA October, 2012    Past Surgical History  Procedure Date  . Total knee arthroplasty   . Coronary artery bypass graft 1994    x2  . Anal fissure repair   . Pr vein bypass graft,aorto-fem-pop   . 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 Marital Status: Married    Spouse Name: N/A    Number of Children: N/A  . Years of Education: N/A   Occupational History  . Not on file.   Social History Main Topics  . Smoking status: Current Some Day Smoker -- 0.5 packs/day for 30 years    Types: Cigars, Cigarettes  . Smokeless tobacco: Never Used  . Alcohol Use: Yes     Comment: occasionally  . Drug Use: No  . Sexually Active: Not on file   Other Topics Concern  . Not on file   Social History Narrative  . No narrative on  file    Family History  Problem Relation Age of Onset  . Stroke Father 11  . Diabetes Other     Allergies as of 03/23/2012 - Review Complete 03/23/2012  Allergen Reaction Noted  . Amoxicillin    . Morphine Nausea And Vomiting     Current Outpatient Prescriptions on File Prior to Visit  Medication Sig Dispense Refill  . docusate sodium (COLACE) 100 MG capsule Take 100-200 mg by mouth daily as needed. For constipation      . furosemide (LASIX) 40 MG tablet Take 40 mg by mouth every morning. Take one (1) tablet by mouth daily.      Marland Kitchen gabapentin (NEURONTIN) 100 MG capsule Take 100 mg by mouth 3 (three) times daily.        . magnesium gluconate (MAGONATE) 500 MG tablet Take 500 mg by mouth daily.       . metFORMIN (GLUCOPHAGE) 850 MG tablet Take 850 mg by mouth every morning.       . metolazone (ZAROXOLYN) 2.5 MG tablet Take 2.5 mg by mouth daily.       . potassium chloride SA  (K-DUR,KLOR-CON) 20 MEQ tablet Take 40 mEq by mouth daily.       . simvastatin (ZOCOR) 40 MG tablet Take 1 tablet (40 mg total) by mouth at bedtime.  90 tablet  0  . spironolactone (ALDACTONE) 25 MG tablet Take 25 mg by mouth daily. Take one (1) tablet by mouth two (2) times a day.      . warfarin (COUMADIN) 2.5 MG tablet TAKE AS DIRECTED BY COUMADIN CLINIC  120 tablet  1  . ENSURE (ENSURE) Take 237 mLs by mouth 2 (two) times daily.          REVIEW OF SYSTEMS: No changes from prior visit  PHYSICAL EXAMINATION:   Vital signs are BP 109/66  Pulse 59  Resp 16  Ht 5\' 11"  (1.803 m)  Wt 217 lb (98.431 kg)  BMI 30.27 kg/m2  SpO2 96% General: The patient appears their stated age. HEENT:  No gross abnormalities Pulmonary:  Non labored breathing Abdomen: Soft and non-tender Musculoskeletal: Left leg is surgically absent Neurologic: No focal weakness or paresthesias are detected, Skin: There are no ulcer or rashes noted. Psychiatric: The patient has normal affect. Cardiovascular: There is a regular rate and rhythm without significant murmur appreciated. No carotid bruits. Palpable femoral pulses.   Diagnostic Studies I have reviewed his CT angiogram. By my measurements the maximum diameter of the abdominal aortic aneurysm is 5.6 cm.  Assessment: Abdominal aortic aneurysm Plan: The patient's aneurysm has increased in size, it now measures 5.6 cm. After reviewing the images, I feel the patient is a good candidate for an endovascular repair, which I believe can be done percutaneously. I have discussed the risks and benefits of the procedure, including the risk of death, cardia point complications, intestinal ischemia, lower extremity ischemia. We also discussed that he would be at high risk of rupture should we not repair his aneurysm. He wishes to proceed. I will get a formal cardiology clearance. He'll also need to be off of his Coumadin 5 days prior to his operation. His operation is been  scheduled for February 20  V. Charlena Cross, M.D. [facility] [place] Office: [phone] Pager:  [phone]

## 2012-03-30 ENCOUNTER — Other Ambulatory Visit: Payer: Self-pay

## 2012-03-30 ENCOUNTER — Encounter: Payer: Self-pay | Admitting: Cardiology

## 2012-03-30 DIAGNOSIS — I2510 Atherosclerotic heart disease of native coronary artery without angina pectoris: Secondary | ICD-10-CM

## 2012-03-30 NOTE — Progress Notes (Signed)
    The patient has been seen by Dr. Myra Gianotti of vascular surgery. He does need to have his abdominal aortic aneurysm repaired. It appears that can be done with an endovascular procedure. It is appropriate however for [place] to screen him with a nuclear study at this time. We will arrange for a Lexiscan myoview.

## 2012-04-03 ENCOUNTER — Other Ambulatory Visit: Payer: Self-pay

## 2012-04-08 ENCOUNTER — Encounter (HOSPITAL_COMMUNITY): Payer: Medicare Other

## 2012-04-10 ENCOUNTER — Encounter: Payer: Self-pay | Admitting: Cardiology

## 2012-04-10 NOTE — Progress Notes (Signed)
   I responded to [facility] today that he does not need Lovenox bridge. His Nuclear scan is 04/13/2012.

## 2012-04-13 ENCOUNTER — Ambulatory Visit (INDEPENDENT_AMBULATORY_CARE_PROVIDER_SITE_OTHER): Payer: Medicare Other

## 2012-04-13 ENCOUNTER — Ambulatory Visit (HOSPITAL_COMMUNITY): Payer: Medicare Other | Attending: Cardiovascular Disease | Admitting: Radiology

## 2012-04-13 VITALS — BP 124/53 | Ht 71.0 in | Wt 220.0 lb

## 2012-04-13 DIAGNOSIS — Z8679 Personal history of other diseases of the circulatory system: Secondary | ICD-10-CM

## 2012-04-13 DIAGNOSIS — I447 Left bundle-branch block, unspecified: Secondary | ICD-10-CM

## 2012-04-13 DIAGNOSIS — I4891 Unspecified atrial fibrillation: Secondary | ICD-10-CM

## 2012-04-13 DIAGNOSIS — Z7901 Long term (current) use of anticoagulants: Secondary | ICD-10-CM

## 2012-04-13 DIAGNOSIS — F17200 Nicotine dependence, unspecified, uncomplicated: Secondary | ICD-10-CM | POA: Insufficient documentation

## 2012-04-13 DIAGNOSIS — R0989 Other specified symptoms and signs involving the circulatory and respiratory systems: Secondary | ICD-10-CM | POA: Insufficient documentation

## 2012-04-13 DIAGNOSIS — I4510 Unspecified right bundle-branch block: Secondary | ICD-10-CM | POA: Insufficient documentation

## 2012-04-13 DIAGNOSIS — I2510 Atherosclerotic heart disease of native coronary artery without angina pectoris: Secondary | ICD-10-CM

## 2012-04-13 DIAGNOSIS — I739 Peripheral vascular disease, unspecified: Secondary | ICD-10-CM | POA: Insufficient documentation

## 2012-04-13 DIAGNOSIS — R0609 Other forms of dyspnea: Secondary | ICD-10-CM | POA: Insufficient documentation

## 2012-04-13 LAB — POCT INR: INR: 2

## 2012-04-13 MED ORDER — REGADENOSON 0.4 MG/5ML IV SOLN
0.4000 mg | Freq: Once | INTRAVENOUS | Status: AC
  Administered 2012-04-13: 0.4 mg via INTRAVENOUS

## 2012-04-13 MED ORDER — TECHNETIUM TC 99M SESTAMIBI GENERIC - CARDIOLITE
10.0000 | Freq: Once | INTRAVENOUS | Status: AC | PRN
  Administered 2012-04-13: 10 via INTRAVENOUS

## 2012-04-13 MED ORDER — TECHNETIUM TC 99M SESTAMIBI GENERIC - CARDIOLITE
30.0000 | Freq: Once | INTRAVENOUS | Status: AC | PRN
  Administered 2012-04-13: 30 via INTRAVENOUS

## 2012-04-13 NOTE — Progress Notes (Signed)
[facility] SITE 3 NUCLEAR MED [address] [place], [place] [address] [phone]    Cardiology Nuclear Med Study  Derek Frye is a 72 y.o. male     MRN : [id number]     DOB: 1940-07-27  Procedure Date: 04/13/2012  Nuclear Med Background Indication for Stress Test:  Evaluation for Ischemia, Surgical Clearance:Pre op for AAA on 04/30/12 with Dr. Myra Gianotti and Graft Patency History:  '94 Redo CABG, old MI, '07 MPS: EF: 50% (-) ischemia, '11 EF: 45% H/O Prostate CA '12 (L) leg AKA  Cardiac Risk Factors: Carotid Disease, Lipids, NIDDM, PVD, RBBB and Smoker  Symptoms:  DOE   Nuclear Pre-Procedure Caffeine/Decaff Intake:  8:00pm NPO After: 8:00am   Lungs:  clear O2 Sat: 95% on room air. IV 0.9% NS with Angio Cath:  20g  IV Site: R Wrist  IV Started by:  Cathlyn Parsons, RN  Chest Size (in):  50 Cup Size: n/a  Height: 5\' 11"  (1.803 m)  Weight:  220 lb (99.791 kg)  BMI:  Body mass index is 30.68 kg/(m^2). Tech Comments: n/a    Nuclear Med Study 1 or 2 day study: 1 day  Stress Test Type:  Lexiscan  Reading MD: Kristeen Miss, MD  Order Authorizing Provider:  Effie Shy  Resting Radionuclide: Technetium 48m Sestamibi  Resting Radionuclide Dose: 11.0 mCi   Stress Radionuclide:  Technetium 12m Sestamibi  Stress Radionuclide Dose: 33.0 mCi           Stress Protocol Rest HR: 56 Stress HR: 74  Rest BP: 124/53 Stress BP: 123/51  Exercise Time (min): n/a METS: n/a   Predicted Max HR: 147 bpm % Max HR: 50.34 bpm Rate Pressure Product: 9176    Dose of Adenosine (mg):  n/a Dose of Lexiscan: 0.4 mg  Dose of Atropine (mg): n/a Dose of Dobutamine: n/a mcg/kg/min (at max HR)  Stress Test Technologist: Milana Na, EMT-P  Nuclear Technologist:  Domenic Polite, CNMT     Rest Procedure:  Myocardial perfusion imaging was performed at rest 45 minutes following the intravenous administration of Technetium 41m Sestamibi. Rest ECG: Atrial fib with RBBB.  Stress  Procedure:  The patient received IV Lexiscan 0.4 mg over 15-seconds. This patient felt funny in his throat and chest with occ pvcs with Lexiscan. Technetium 73m Sestamibi injected at 30-seconds.  Quantitative spect images were obtained after a 45 minute delay. Stress ECG: No significant change from baseline ECG  QPS Raw Data Images:  Normal; no motion artifact; normal heart/lung ratio. Stress Images:  The LV is markedly enlarged.  there is a large severe defect in the mid/basal lateral wall.  The uptake in the remaining segments is fairly normal.     Rest Images:  The LV is markedly enlarged.  there is a large severe defect in the mid/basal lateral wall.  The uptake in the remaining segments is fairly normal. Subtraction (SDS):  No evidence of ischemia.  There is a large lateral scar extending from the mid wall to the base.  Transient Ischemic Dilatation (Normal <1.22):  1.04 Lung/Heart Ratio (Normal <0.45):  0.44  Quantitative Gated Spect Images QGS EDV:  NA QGS ESV:   NA  Impression Exercise Capacity:  Lexiscan with no exercise. BP Response:  Normal blood pressure response. Clinical Symptoms:  No significant symptoms noted. ECG Impression:  No significant ST segment change suggestive of ischemia. Comparison with Prior Nuclear Study: No images to compare  Overall Impression:  High risk stress nuclear study.  There is a large lateral scar and the LV is dilated.  This MI may be old as indicated by his history.  LV Ejection Fraction:NA LV Wall Motion:  NA.   Alvia Grove., MD, [facility] 04/13/2012, 4:58 PM Office - [phone] Pager [phone]

## 2012-04-14 ENCOUNTER — Encounter: Payer: Self-pay | Admitting: Cardiology

## 2012-04-14 NOTE — Progress Notes (Signed)
   Nuclear stress study has been completed. There is an old large scar in the lateral wall. There is no significant ischemia. The patient is now cleared for his vascular surgical procedure.  Jerral Bonito, MD

## 2012-04-16 ENCOUNTER — Encounter (HOSPITAL_COMMUNITY): Payer: Self-pay | Admitting: Pharmacy Technician

## 2012-04-17 ENCOUNTER — Encounter (HOSPITAL_COMMUNITY)
Admission: RE | Admit: 2012-04-17 | Discharge: 2012-04-17 | Disposition: A | Discharge status: Home / Self Care | Payer: Medicare Other | Source: Ambulatory Visit | Attending: Surgery | Admitting: Surgery

## 2012-04-17 ENCOUNTER — Telehealth: Payer: Self-pay | Admitting: Cardiology

## 2012-04-17 ENCOUNTER — Other Ambulatory Visit (HOSPITAL_COMMUNITY): Payer: Medicare Other

## 2012-04-17 ENCOUNTER — Encounter (HOSPITAL_COMMUNITY): Payer: Self-pay

## 2012-04-17 HISTORY — DX: Unspecified osteoarthritis, unspecified site: M19.90

## 2012-04-17 HISTORY — DX: Cardiac murmur, unspecified: R01.1

## 2012-04-17 LAB — TYPE AND SCREEN

## 2012-04-17 LAB — URINALYSIS, ROUTINE W REFLEX MICROSCOPIC
Ketones, ur: NEGATIVE mg/dL
Leukocytes, UA: NEGATIVE
Nitrite: NEGATIVE
Protein, ur: NEGATIVE mg/dL
Urobilinogen, UA: 1 mg/dL (ref 0.0–1.0)

## 2012-04-17 LAB — BLOOD GAS, ARTERIAL
Bicarbonate: 25 mEq/L — ABNORMAL HIGH (ref 20.0–24.0)
O2 Saturation: 97.5 %
Patient temperature: 98.6
TCO2: 26.2 mmol/L (ref 0–100)
pH, Arterial: 7.418 (ref 7.350–7.450)

## 2012-04-17 LAB — COMPREHENSIVE METABOLIC PANEL
Alkaline Phosphatase: 93 U/L (ref 39–117)
BUN: 26 mg/dL — ABNORMAL HIGH (ref 6–23)
Chloride: 98 mEq/L (ref 96–112)
GFR calc Af Amer: >90 mL/min (ref >90)
GFR calc non Af Amer: 85 mL/min — ABNORMAL LOW (ref >90)
Glucose, Bld: 164 mg/dL — ABNORMAL HIGH (ref 70–99)
Potassium: 3.6 mEq/L (ref 3.5–5.1)
Total Bilirubin: 0.6 mg/dL (ref 0.3–1.2)
Total Protein: 7.7 g/dL (ref 6.0–8.3)

## 2012-04-17 LAB — CBC
HCT: 35.7 % — ABNORMAL LOW (ref 39.0–52.0)
Hemoglobin: 12.9 g/dL — ABNORMAL LOW (ref 13.0–17.0)
MCHC: 36.1 g/dL — ABNORMAL HIGH (ref 30.0–36.0)

## 2012-04-17 LAB — PROTIME-INR: Prothrombin Time: 18.7 seconds — ABNORMAL HIGH (ref 11.6–15.2)

## 2012-04-17 LAB — SURGICAL PCR SCREEN: MRSA, PCR: NEGATIVE

## 2012-04-17 NOTE — Progress Notes (Signed)
04/17/12 1127  OBSTRUCTIVE SLEEP APNEA  Score 4 or greater  Results sent to PCP

## 2012-04-17 NOTE — Telephone Encounter (Signed)
Nuclear results were given to Mr Lease.

## 2012-04-17 NOTE — Pre-Procedure Instructions (Addendum)
CIPRIANO MILLIKAN  04/17/2012   Your procedure is scheduled on: 04/30/12  Report to [facility] at 530 AM.  Call this number if you have problems the morning of surgery: [phone]   Remember:   Do not eat food or drink liquids after midnight.   Take these medicines the morning of surgery with A SIP OF WATER: gabapentin  STOP coumadin per dr   Drucilla Schmidt not wear jewelry, make-up or nail polish.  Do not wear lotions, powders, or perfumes. You may not wear deodorant.  Do not shave 48 hours prior to surgery. Men may shave face and neck.  Do not bring valuables to the hospital.  Contacts, dentures or bridgework may not be worn into surgery.  Leave suitcase in the car. After surgery it may be brought to your room.  For patients admitted to the hospital, checkout time is 11:00 AM the day of  discharge.   Patients discharged the day of surgery will not be allowed to drive  home.  Name and phone number of your driver  Special Instructions: Shower using CHG 2 nights before surgery and the night before surgery.  If you shower the day of surgery use CHG.  Use special wash - you have one bottle of CHG for all showers.  You should use approximately 1/3 of the bottle for each shower.   Please read over the following fact sheets that you were given: Pain Booklet, Coughing and Deep Breathing, Blood Transfusion Information, MRSA Information and Surgical Site Infection Prevention

## 2012-04-17 NOTE — Telephone Encounter (Signed)
Per pt spouse call they where told they would get a call regarding his nuclear results on Wednesday and it is Friday and they still have not heard anything and they would like the results

## 2012-04-20 NOTE — Consult Note (Signed)
Anesthesia Chart Review:  Patient is a 72 year old male scheduled for EVAR of AAA on 04/30/12 by Dr. Myra Gianotti.  History includes AAA, smoking, obesity, HTN, HLD, CAD s/p redo CABG '94, chronic afib, prostate cancer s/p seed implant, DM2, PVD s/p LLE amputation '12, chronic RLE edema, 40-59% bilateral ICA stenosis by duplex 02/2012.  Cardiologist is Dr. Myrtis Ser, who cleared patient following a stress test that showed no significant ischemia (see below).  EKG on 02/24/12 showed afib @ 49 bpm, right BBB, inferior and anterior infarct (age undetermined).    Nuclear stress test on 04/13/12 showed: Hgh risk stress nuclear study. There is a large lateral scar and the LV is dilated. This MI may be old as indicated by his history.  This was reviewed by Dr. Myrtis Ser who noted, "There is an old large scar in the lateral wall. There is no significant ischemia. The patient is now cleared for his vascular surgical procedure."  Echo on 09/22/09 showed: - Left ventricle: Difficult to assess RWMA;;s due to poor image quality. Septum and apex are severely hypokinetic. Possible inferior wall hypokinesis The cavity size was mildly dilated. Wall thickness was increased in a pattern of mild LVH. The estimated ejection fraction was 45%. - Mitral valve: Mild regurgitation. - Left atrium: The atrium was mildly dilated. - Atrial septum: No defect or patent foramen ovale was identified. - Tricuspid valve: Mild regurgitation. - Pulmonary arteries: PA peak pressure: 42mm Hg (S).  CXR on 04/17/12 showed stable cardiomegaly, no acute cardiopulmonary abnormality.  Pre-operative labs noted.  Patient to stop his Coumadin on 04/21/12 per PAT RN notes.  PT/PTT on arrival.  If follow-up labs are reasonable and no new symptoms then anticipate patient can proceed as planned.  Shonna Chock, PA-C 04/20/12 [phone]

## 2012-04-21 ENCOUNTER — Other Ambulatory Visit (HOSPITAL_COMMUNITY): Payer: Medicare Other

## 2012-04-29 MED ORDER — VANCOMYCIN HCL IN DEXTROSE 1-5 GM/200ML-% IV SOLN: 1000.0000 mg | INTRAVENOUS | Status: DC

## 2012-04-29 MED ORDER — SODIUM CHLORIDE 0.9 % IV SOLN: INTRAVENOUS | Status: DC

## 2012-04-29 MED ORDER — VANCOMYCIN HCL 10 G IV SOLR
1500.0000 mg | INTRAVENOUS | Status: AC
  Administered 2012-04-30: 1500 mg via INTRAVENOUS
  Filled 2012-04-29: qty 1500

## 2012-04-30 ENCOUNTER — Other Ambulatory Visit: Payer: Self-pay | Admitting: *Deleted

## 2012-04-30 ENCOUNTER — Inpatient Hospital Stay (HOSPITAL_COMMUNITY): Payer: Medicare Other | Admitting: Vascular Surgery

## 2012-04-30 ENCOUNTER — Inpatient Hospital Stay (HOSPITAL_COMMUNITY)
Admission: RE | Admit: 2012-04-30 | Discharge: 2012-05-01 | DRG: 237 | Disposition: A | Discharge status: Home / Self Care | Payer: Medicare Other | Source: Ambulatory Visit | Attending: Surgery | Admitting: Surgery

## 2012-04-30 ENCOUNTER — Inpatient Hospital Stay (HOSPITAL_COMMUNITY): Payer: Medicare Other

## 2012-04-30 ENCOUNTER — Encounter (HOSPITAL_COMMUNITY): Payer: Self-pay | Admitting: Vascular Surgery

## 2012-04-30 ENCOUNTER — Encounter (HOSPITAL_COMMUNITY): Payer: Self-pay | Admitting: *Deleted

## 2012-04-30 ENCOUNTER — Encounter (HOSPITAL_COMMUNITY)
Admission: RE | Disposition: A | Discharge status: Home / Self Care | Payer: Self-pay | Source: Ambulatory Visit | Attending: Surgery

## 2012-04-30 DIAGNOSIS — Z7901 Long term (current) use of anticoagulants: Secondary | ICD-10-CM

## 2012-04-30 DIAGNOSIS — Z48812 Encounter for surgical aftercare following surgery on the circulatory system: Secondary | ICD-10-CM

## 2012-04-30 DIAGNOSIS — I714 Abdominal aortic aneurysm, without rupture, unspecified: Principal | ICD-10-CM | POA: Diagnosis present

## 2012-04-30 DIAGNOSIS — I10 Essential (primary) hypertension: Secondary | ICD-10-CM | POA: Diagnosis present

## 2012-04-30 DIAGNOSIS — E785 Hyperlipidemia, unspecified: Secondary | ICD-10-CM | POA: Diagnosis present

## 2012-04-30 DIAGNOSIS — L89609 Pressure ulcer of unspecified heel, unspecified stage: Secondary | ICD-10-CM | POA: Diagnosis present

## 2012-04-30 DIAGNOSIS — Z79899 Other long term (current) drug therapy: Secondary | ICD-10-CM

## 2012-04-30 DIAGNOSIS — I4891 Unspecified atrial fibrillation: Secondary | ICD-10-CM | POA: Diagnosis present

## 2012-04-30 DIAGNOSIS — E669 Obesity, unspecified: Secondary | ICD-10-CM | POA: Diagnosis present

## 2012-04-30 DIAGNOSIS — F17200 Nicotine dependence, unspecified, uncomplicated: Secondary | ICD-10-CM | POA: Diagnosis present

## 2012-04-30 DIAGNOSIS — Z6832 Body mass index (BMI) 32.0-32.9, adult: Secondary | ICD-10-CM

## 2012-04-30 DIAGNOSIS — L8994 Pressure ulcer of unspecified site, stage 4: Secondary | ICD-10-CM | POA: Diagnosis present

## 2012-04-30 DIAGNOSIS — Z951 Presence of aortocoronary bypass graft: Secondary | ICD-10-CM

## 2012-04-30 DIAGNOSIS — I6529 Occlusion and stenosis of unspecified carotid artery: Secondary | ICD-10-CM | POA: Diagnosis present

## 2012-04-30 DIAGNOSIS — I2510 Atherosclerotic heart disease of native coronary artery without angina pectoris: Secondary | ICD-10-CM | POA: Diagnosis present

## 2012-04-30 DIAGNOSIS — Z01812 Encounter for preprocedural laboratory examination: Secondary | ICD-10-CM

## 2012-04-30 DIAGNOSIS — I4510 Unspecified right bundle-branch block: Secondary | ICD-10-CM | POA: Diagnosis present

## 2012-04-30 DIAGNOSIS — Z823 Family history of stroke: Secondary | ICD-10-CM

## 2012-04-30 DIAGNOSIS — Z833 Family history of diabetes mellitus: Secondary | ICD-10-CM

## 2012-04-30 DIAGNOSIS — I252 Old myocardial infarction: Secondary | ICD-10-CM

## 2012-04-30 DIAGNOSIS — Z96659 Presence of unspecified artificial knee joint: Secondary | ICD-10-CM

## 2012-04-30 DIAGNOSIS — I739 Peripheral vascular disease, unspecified: Secondary | ICD-10-CM | POA: Diagnosis present

## 2012-04-30 DIAGNOSIS — S78119A Complete traumatic amputation at level between unspecified hip and knee, initial encounter: Secondary | ICD-10-CM

## 2012-04-30 DIAGNOSIS — Z8546 Personal history of malignant neoplasm of prostate: Secondary | ICD-10-CM

## 2012-04-30 HISTORY — PX: ABDOMINAL AORTIC ENDOVASCULAR STENT GRAFT: SHX5707

## 2012-04-30 LAB — BASIC METABOLIC PANEL
BUN: 27 mg/dL — ABNORMAL HIGH (ref 6–23)
CO2: 28 mEq/L (ref 19–32)
Chloride: 101 mEq/L (ref 96–112)
Glucose, Bld: 154 mg/dL — ABNORMAL HIGH (ref 70–99)
Potassium: 3.7 mEq/L (ref 3.5–5.1)

## 2012-04-30 LAB — GLUCOSE, CAPILLARY
Glucose-Capillary: 151 mg/dL — ABNORMAL HIGH (ref 70–99)
Glucose-Capillary: 156 mg/dL — ABNORMAL HIGH (ref 70–99)

## 2012-04-30 LAB — CBC
HCT: 34.1 % — ABNORMAL LOW (ref 39.0–52.0)
Hemoglobin: 11.7 g/dL — ABNORMAL LOW (ref 13.0–17.0)
WBC: 6.9 10*3/uL (ref 4.0–10.5)

## 2012-04-30 LAB — APTT: aPTT: 39 seconds — ABNORMAL HIGH (ref 24–37)

## 2012-04-30 LAB — MAGNESIUM: Magnesium: 1.5 mg/dL (ref 1.5–2.5)

## 2012-04-30 LAB — PROTIME-INR: Prothrombin Time: 15.4 seconds — ABNORMAL HIGH (ref 11.6–15.2)

## 2012-04-30 SURGERY — INSERTION, ENDOVASCULAR STENT GRAFT, AORTA, ABDOMINAL
Anesthesia: General | Wound class: Clean

## 2012-04-30 MED ORDER — OXYCODONE HCL 5 MG PO TABS: 5.0000 mg | ORAL_TABLET | Freq: Four times a day (QID) | ORAL | Status: DC | PRN

## 2012-04-30 MED ORDER — PANTOPRAZOLE SODIUM 40 MG PO TBEC
40.0000 mg | DELAYED_RELEASE_TABLET | Freq: Every day | ORAL | Status: DC
  Administered 2012-05-01: 40 mg via ORAL
  Filled 2012-04-30 (×2): qty 1

## 2012-04-30 MED ORDER — ONDANSETRON HCL 4 MG/2ML IJ SOLN: 4.0000 mg | Freq: Four times a day (QID) | INTRAMUSCULAR | Status: DC | PRN

## 2012-04-30 MED ORDER — HEPARIN SODIUM (PORCINE) 1000 UNIT/ML IJ SOLN
INTRAMUSCULAR | Status: DC | PRN
  Administered 2012-04-30: 8000 [IU] via INTRAVENOUS

## 2012-04-30 MED ORDER — FUROSEMIDE 40 MG PO TABS
40.0000 mg | ORAL_TABLET | ORAL | Status: DC
  Administered 2012-05-01: 40 mg via ORAL
  Filled 2012-04-30 (×2): qty 1

## 2012-04-30 MED ORDER — PHENYLEPHRINE HCL 10 MG/ML IJ SOLN
10.0000 mg | INTRAVENOUS | Status: DC | PRN
  Administered 2012-04-30: 20 ug/min via INTRAVENOUS

## 2012-04-30 MED ORDER — VANCOMYCIN HCL IN DEXTROSE 1-5 GM/200ML-% IV SOLN
1000.0000 mg | Freq: Two times a day (BID) | INTRAVENOUS | Status: AC
  Administered 2012-04-30 – 2012-05-01 (×2): 1000 mg via INTRAVENOUS
  Filled 2012-04-30 (×2): qty 200

## 2012-04-30 MED ORDER — WARFARIN 1.25 MG HALF TABLET
1.2500 mg | ORAL_TABLET | ORAL | Status: DC
  Administered 2012-04-30: 1.25 mg via ORAL
  Filled 2012-04-30 (×2): qty 1

## 2012-04-30 MED ORDER — OXYCODONE HCL 5 MG PO TABS: 5.0000 mg | ORAL_TABLET | ORAL | Status: DC | PRN

## 2012-04-30 MED ORDER — FENTANYL CITRATE 0.05 MG/ML IJ SOLN: 25.0000 ug | INTRAMUSCULAR | Status: DC | PRN

## 2012-04-30 MED ORDER — ACETAMINOPHEN 325 MG PO TABS: 325.0000 mg | ORAL_TABLET | ORAL | Status: DC | PRN

## 2012-04-30 MED ORDER — IODIXANOL 320 MG/ML IV SOLN
INTRAVENOUS | Status: DC | PRN
  Administered 2012-04-30: 150 mL via INTRA_ARTERIAL

## 2012-04-30 MED ORDER — DOPAMINE-DEXTROSE 3.2-5 MG/ML-% IV SOLN: 3.0000 ug/kg/min | INTRAVENOUS | Status: DC

## 2012-04-30 MED ORDER — LABETALOL HCL 5 MG/ML IV SOLN: 10.0000 mg | INTRAVENOUS | Status: DC | PRN

## 2012-04-30 MED ORDER — SODIUM CHLORIDE 0.9 % IV SOLN
INTRAVENOUS | Status: DC | PRN
  Administered 2012-04-30: 11:00:00 via INTRAVENOUS

## 2012-04-30 MED ORDER — SPIRONOLACTONE 25 MG PO TABS
25.0000 mg | ORAL_TABLET | Freq: Every day | ORAL | Status: DC
  Administered 2012-05-01: 25 mg via ORAL
  Filled 2012-04-30 (×2): qty 1

## 2012-04-30 MED ORDER — ZOLPIDEM TARTRATE 5 MG PO TABS: 5.0000 mg | ORAL_TABLET | Freq: Every evening | ORAL | Status: DC | PRN

## 2012-04-30 MED ORDER — ALUM & MAG HYDROXIDE-SIMETH 200-200-20 MG/5ML PO SUSP: 15.0000 mL | ORAL | Status: DC | PRN

## 2012-04-30 MED ORDER — ROCURONIUM BROMIDE 100 MG/10ML IV SOLN
INTRAVENOUS | Status: DC | PRN
  Administered 2012-04-30: 50 mg via INTRAVENOUS

## 2012-04-30 MED ORDER — MAGNESIUM SULFATE 40 MG/ML IJ SOLN
2.0000 g | Freq: Once | INTRAMUSCULAR | Status: AC | PRN
  Filled 2012-04-30: qty 50

## 2012-04-30 MED ORDER — WARFARIN SODIUM 2.5 MG PO TABS: 2.5000 mg | ORAL_TABLET | ORAL | Status: DC

## 2012-04-30 MED ORDER — WARFARIN - PHYSICIAN DOSING INPATIENT: Freq: Every day | Status: DC

## 2012-04-30 MED ORDER — FENTANYL CITRATE 0.05 MG/ML IJ SOLN
INTRAMUSCULAR | Status: DC | PRN
  Administered 2012-04-30: 100 ug via INTRAVENOUS

## 2012-04-30 MED ORDER — WARFARIN 1.25 MG HALF TABLET: 1.2500 mg | ORAL_TABLET | Freq: Every day | ORAL | Status: DC

## 2012-04-30 MED ORDER — MIDAZOLAM HCL 2 MG/2ML IJ SOLN
INTRAMUSCULAR | Status: AC
  Filled 2012-04-30: qty 2

## 2012-04-30 MED ORDER — SIMVASTATIN 40 MG PO TABS
40.0000 mg | ORAL_TABLET | Freq: Every day | ORAL | Status: DC
  Administered 2012-04-30: 40 mg via ORAL
  Filled 2012-04-30 (×2): qty 1

## 2012-04-30 MED ORDER — ETOMIDATE 2 MG/ML IV SOLN
INTRAVENOUS | Status: DC | PRN
  Administered 2012-04-30: 20 mg via INTRAVENOUS

## 2012-04-30 MED ORDER — HYDRALAZINE HCL 20 MG/ML IJ SOLN: 10.0000 mg | INTRAMUSCULAR | Status: DC | PRN

## 2012-04-30 MED ORDER — DOCUSATE SODIUM 100 MG PO CAPS
100.0000 mg | ORAL_CAPSULE | Freq: Every day | ORAL | Status: DC
  Administered 2012-05-01: 100 mg via ORAL

## 2012-04-30 MED ORDER — PROTAMINE SULFATE 10 MG/ML IV SOLN
INTRAVENOUS | Status: DC | PRN
  Administered 2012-04-30: 10 mg via INTRAVENOUS
  Administered 2012-04-30 (×2): 20 mg via INTRAVENOUS

## 2012-04-30 MED ORDER — METOLAZONE 2.5 MG PO TABS
2.5000 mg | ORAL_TABLET | Freq: Every day | ORAL | Status: DC
  Administered 2012-04-30 – 2012-05-01 (×2): 2.5 mg via ORAL
  Filled 2012-04-30 (×2): qty 1

## 2012-04-30 MED ORDER — FENTANYL CITRATE 0.05 MG/ML IJ SOLN
INTRAMUSCULAR | Status: AC
  Filled 2012-04-30: qty 2

## 2012-04-30 MED ORDER — POTASSIUM CHLORIDE CRYS ER 20 MEQ PO TBCR
40.0000 meq | EXTENDED_RELEASE_TABLET | Freq: Every day | ORAL | Status: DC
  Administered 2012-04-30 – 2012-05-01 (×2): 40 meq via ORAL
  Filled 2012-04-30 (×2): qty 2

## 2012-04-30 MED ORDER — METOPROLOL TARTRATE 1 MG/ML IV SOLN: 2.0000 mg | INTRAVENOUS | Status: DC | PRN

## 2012-04-30 MED ORDER — DOCUSATE SODIUM 100 MG PO CAPS
100.0000 mg | ORAL_CAPSULE | Freq: Every day | ORAL | Status: DC | PRN
  Filled 2012-04-30: qty 1

## 2012-04-30 MED ORDER — SODIUM CHLORIDE 0.9 % IR SOLN
Status: DC | PRN
  Administered 2012-04-30: 13:00:00

## 2012-04-30 MED ORDER — CEFUROXIME SODIUM 1.5 G IJ SOLR: 1.5000 g | Freq: Two times a day (BID) | INTRAMUSCULAR | Status: DC

## 2012-04-30 MED ORDER — NEOSTIGMINE METHYLSULFATE 1 MG/ML IJ SOLN
INTRAMUSCULAR | Status: DC | PRN
  Administered 2012-04-30: 5 mg via INTRAVENOUS

## 2012-04-30 MED ORDER — GABAPENTIN 100 MG PO CAPS
100.0000 mg | ORAL_CAPSULE | Freq: Three times a day (TID) | ORAL | Status: DC | PRN
  Administered 2012-05-01: 100 mg via ORAL
  Filled 2012-04-30: qty 1

## 2012-04-30 MED ORDER — SODIUM CHLORIDE 0.9 % IR SOLN
Status: DC | PRN
  Administered 2012-04-30: 12:00:00

## 2012-04-30 MED ORDER — METFORMIN HCL 850 MG PO TABS
850.0000 mg | ORAL_TABLET | ORAL | Status: DC
  Filled 2012-04-30 (×2): qty 1

## 2012-04-30 MED ORDER — SODIUM CHLORIDE 0.9 % IV SOLN: 500.0000 mL | Freq: Once | INTRAVENOUS | Status: AC | PRN

## 2012-04-30 MED ORDER — HYDROMORPHONE HCL PF 1 MG/ML IJ SOLN: 0.5000 mg | INTRAMUSCULAR | Status: DC | PRN

## 2012-04-30 MED ORDER — ACETAMINOPHEN 650 MG RE SUPP: 325.0000 mg | RECTAL | Status: DC | PRN

## 2012-04-30 MED ORDER — LIDOCAINE HCL (CARDIAC) 20 MG/ML IV SOLN
INTRAVENOUS | Status: DC | PRN
  Administered 2012-04-30: 80 mg via INTRAVENOUS

## 2012-04-30 MED ORDER — POTASSIUM CHLORIDE CRYS ER 20 MEQ PO TBCR: 20.0000 meq | EXTENDED_RELEASE_TABLET | Freq: Once | ORAL | Status: AC | PRN

## 2012-04-30 MED ORDER — LACTATED RINGERS IV SOLN
INTRAVENOUS | Status: DC | PRN
  Administered 2012-04-30: 10:00:00 via INTRAVENOUS

## 2012-04-30 MED ORDER — VECURONIUM BROMIDE 10 MG IV SOLR
INTRAVENOUS | Status: DC | PRN
  Administered 2012-04-30: 1 mg via INTRAVENOUS
  Administered 2012-04-30: 3 mg via INTRAVENOUS
  Administered 2012-04-30: 1 mg via INTRAVENOUS

## 2012-04-30 MED ORDER — ONDANSETRON HCL 4 MG/2ML IJ SOLN
INTRAMUSCULAR | Status: DC | PRN
  Administered 2012-04-30: 4 mg via INTRAVENOUS

## 2012-04-30 MED ORDER — GUAIFENESIN-DM 100-10 MG/5ML PO SYRP: 15.0000 mL | ORAL_SOLUTION | ORAL | Status: DC | PRN

## 2012-04-30 MED ORDER — ARTIFICIAL TEARS OP OINT
TOPICAL_OINTMENT | OPHTHALMIC | Status: DC | PRN
  Administered 2012-04-30: 1 via OPHTHALMIC

## 2012-04-30 MED ORDER — PHENYLEPHRINE HCL 10 MG/ML IJ SOLN: 20.0000 mg | INTRAVENOUS | Status: DC | PRN

## 2012-04-30 MED ORDER — INSULIN ASPART 100 UNIT/ML ~~LOC~~ SOLN
0.0000 [IU] | Freq: Three times a day (TID) | SUBCUTANEOUS | Status: DC
  Administered 2012-05-01: 3 [IU] via SUBCUTANEOUS
  Administered 2012-05-01: 5 [IU] via SUBCUTANEOUS

## 2012-04-30 MED ORDER — MAGNESIUM GLUCONATE 500 MG PO TABS
500.0000 mg | ORAL_TABLET | Freq: Every day | ORAL | Status: DC
  Filled 2012-04-30 (×3): qty 1

## 2012-04-30 MED ORDER — LACTATED RINGERS IV SOLN
INTRAVENOUS | Status: DC | PRN
  Administered 2012-04-30: 11:00:00 via INTRAVENOUS

## 2012-04-30 MED ORDER — PHENOL 1.4 % MT LIQD: 1.0000 | OROMUCOSAL | Status: DC | PRN

## 2012-04-30 MED ORDER — 0.9 % SODIUM CHLORIDE (POUR BTL) OPTIME
TOPICAL | Status: DC | PRN
  Administered 2012-04-30: 1000 mL

## 2012-04-30 MED ORDER — SODIUM CHLORIDE 0.9 % IV SOLN
INTRAVENOUS | Status: DC
  Administered 2012-04-30: 15:00:00 via INTRAVENOUS

## 2012-04-30 MED ORDER — GLYCOPYRROLATE 0.2 MG/ML IJ SOLN
INTRAMUSCULAR | Status: DC | PRN
  Administered 2012-04-30: 0.6 mg via INTRAVENOUS

## 2012-04-30 SURGICAL SUPPLY — 88 items
ADH SKN CLS APL DERMABOND .7 (GAUZE/BANDAGES/DRESSINGS) ×1
ADH SKN CLS LQ APL DERMABOND (GAUZE/BANDAGES/DRESSINGS) ×1
BAG BANDED W/RUBBER/TAPE 36X54 (MISCELLANEOUS) ×3 IMPLANT
BAG DECANTER FOR FLEXI CONT (MISCELLANEOUS) IMPLANT
BAG EQP BAND 135X91 W/RBR TAPE (MISCELLANEOUS) ×1
BAG SNAP BAND KOVER 36X36 (MISCELLANEOUS) ×4 IMPLANT
BALLN CODA OCL 2-9.0-35-120-3 (BALLOONS)
BALLOON COD OCL 2-9.0-35-120-3 (BALLOONS) IMPLANT
CANISTER SUCTION 2500CC (MISCELLANEOUS) ×2 IMPLANT
CATH BEACON 5.038 65CM KMP-01 (CATHETERS) ×1 IMPLANT
CATH OMNI FLUSH .035X70CM (CATHETERS) ×1 IMPLANT
CLIP TI MEDIUM 24 (CLIP) IMPLANT
CLIP TI WIDE RED SMALL 24 (CLIP) IMPLANT
CLOTH BEACON ORANGE TIMEOUT ST (SAFETY) ×2 IMPLANT
COVER DOME SNAP 22 D (MISCELLANEOUS) ×2 IMPLANT
COVER MAYO STAND STRL (DRAPES) ×2 IMPLANT
COVER PROBE W GEL 5X96 (DRAPES) ×2 IMPLANT
COVER SURGICAL LIGHT HANDLE (MISCELLANEOUS) ×2 IMPLANT
DERMABOND ADHESIVE PROPEN (GAUZE/BANDAGES/DRESSINGS) ×1
DERMABOND ADVANCED (GAUZE/BANDAGES/DRESSINGS) ×1
DERMABOND ADVANCED .7 DNX12 (GAUZE/BANDAGES/DRESSINGS) ×1 IMPLANT
DERMABOND ADVANCED .7 DNX6 (GAUZE/BANDAGES/DRESSINGS) IMPLANT
DEVICE CLOSURE PERCLS PRGLD 6F (VASCULAR PRODUCTS) IMPLANT
DRAIN CHANNEL 10F 3/8 F FF (DRAIN) IMPLANT
DRAIN CHANNEL 10M FLAT 3/4 FLT (DRAIN) IMPLANT
DRAPE TABLE COVER HEAVY DUTY (DRAPES) ×2 IMPLANT
DRESSING OPSITE X SMALL 2X3 (GAUZE/BANDAGES/DRESSINGS) ×2 IMPLANT
DRYSEAL FLEXSHEATH 14FR 33CM (SHEATH) ×1
DRYSEAL FLEXSHEATH 18FR 33CM (SHEATH) ×1
ELECT REM PT RETURN 9FT ADLT (ELECTROSURGICAL) ×4
ELECTRODE REM PT RTRN 9FT ADLT (ELECTROSURGICAL) ×2 IMPLANT
EVACUATOR 3/16  PVC DRAIN (DRAIN)
EVACUATOR 3/16 PVC DRAIN (DRAIN) IMPLANT
EVACUATOR SILICONE 100CC (DRAIN) IMPLANT
EXCLUDER TNK LEG 31MX14X13 (Endovascular Graft) IMPLANT
EXCLUDER TRUNK LEG 31MX14X13 (Endovascular Graft) ×2 IMPLANT
GAUZE SPONGE 2X2 8PLY STRL LF (GAUZE/BANDAGES/DRESSINGS) ×2 IMPLANT
GLOVE BIO SURGEON STRL SZ 6.5 (GLOVE) IMPLANT
GLOVE BIOGEL PI IND STRL 7.0 (GLOVE) IMPLANT
GLOVE BIOGEL PI IND STRL 7.5 (GLOVE) ×1 IMPLANT
GLOVE BIOGEL PI INDICATOR 7.0 (GLOVE) ×2
GLOVE BIOGEL PI INDICATOR 7.5 (GLOVE) ×1
GLOVE SURG SS PI 7.5 STRL IVOR (GLOVE) ×2 IMPLANT
GOWN PREVENTION PLUS XXLARGE (GOWN DISPOSABLE) ×2 IMPLANT
GOWN STRL NON-REIN LRG LVL3 (GOWN DISPOSABLE) ×8 IMPLANT
GRAFT BALLN CATH 65CM (STENTS) IMPLANT
HEMOSTAT SNOW SURGICEL 2X4 (HEMOSTASIS) IMPLANT
HEMOSTAT SURGICEL 2X14 (HEMOSTASIS) IMPLANT
KIT BASIN OR (CUSTOM PROCEDURE TRAY) ×2 IMPLANT
KIT ROOM TURNOVER OR (KITS) ×2 IMPLANT
LEG CONTRALATERAL 23X10 (Vascular Products) ×1 IMPLANT
LEG CONTRALATERAL 23X12 (Endovascular Graft) ×1 IMPLANT
NDL PERC 18GX7CM (NEEDLE) ×1 IMPLANT
NEEDLE PERC 18GX7CM (NEEDLE) ×2 IMPLANT
NS IRRIG 1000ML POUR BTL (IV SOLUTION) ×2 IMPLANT
PACK AORTA (CUSTOM PROCEDURE TRAY) ×2 IMPLANT
PAD ARMBOARD 7.5X6 YLW CONV (MISCELLANEOUS) ×4 IMPLANT
PENCIL BUTTON HOLSTER BLD 10FT (ELECTRODE) IMPLANT
PERCLOSE PROGLIDE 6F (VASCULAR PRODUCTS) ×8
PROTECTION STATION PRESSURIZED (MISCELLANEOUS) ×2
SHEATH AVANTI 11CM 8FR (MISCELLANEOUS) ×1 IMPLANT
SHEATH BRITE TIP 8FR 23CM (MISCELLANEOUS) ×1 IMPLANT
SHEATH DRYSEAL FLEX 14FR 33CM (SHEATH) IMPLANT
SHEATH DRYSEAL FLEX 18FR 33CM (SHEATH) IMPLANT
SPONGE GAUZE 2X2 STER 10/PKG (GAUZE/BANDAGES/DRESSINGS) ×1
STAPLER VISISTAT 35W (STAPLE) IMPLANT
STATION PROTECTION PRESSURIZED (MISCELLANEOUS) IMPLANT
STENT GRAFT BALLN CATH 65CM (STENTS) ×1
STOPCOCK MORSE 400PSI 3WAY (MISCELLANEOUS) ×2 IMPLANT
SUT ETHILON 3 0 PS 1 (SUTURE) IMPLANT
SUT PROLENE 5 0 C 1 24 (SUTURE) IMPLANT
SUT VIC AB 2-0 CT1 27 (SUTURE)
SUT VIC AB 2-0 CT1 TAPERPNT 27 (SUTURE) IMPLANT
SUT VIC AB 3-0 SH 27 (SUTURE)
SUT VIC AB 3-0 SH 27X BRD (SUTURE) IMPLANT
SUT VICRYL 4-0 PS2 18IN ABS (SUTURE) ×4 IMPLANT
SYR 20CC LL (SYRINGE) ×7 IMPLANT
SYR 30ML LL (SYRINGE) IMPLANT
SYR 5ML LL (SYRINGE) IMPLANT
SYR MEDRAD MARK V 150ML (SYRINGE) ×2 IMPLANT
SYRINGE 10CC LL (SYRINGE) ×9 IMPLANT
TOWEL OR 17X24 6PK STRL BLUE (TOWEL DISPOSABLE) ×4 IMPLANT
TOWEL OR 17X26 10 PK STRL BLUE (TOWEL DISPOSABLE) ×4 IMPLANT
TRAY FOLEY CATH 14FRSI W/METER (CATHETERS) ×2 IMPLANT
TUBING HIGH PRESSURE 120CM (CONNECTOR) ×2 IMPLANT
WATER STERILE IRR 1000ML POUR (IV SOLUTION) ×2 IMPLANT
WIRE AMPLATZ SS-J .035X180CM (WIRE) ×2 IMPLANT
WIRE BENTSON .035X145CM (WIRE) ×3 IMPLANT

## 2012-04-30 NOTE — Plan of Care (Signed)
Problem: Phase I Progression Outcomes Goal: Vascular site scale level 0 - I Vascular Site Scale Level 0: No bruising/bleeding/hematoma Level I (Mild): Bruising/Ecchymosis, minimal bleeding/ooozing, palpable hematoma < 3 cm Level II (Moderate): Bleeding not affecting hemodynamic parameters, pseudoaneurysm, palpable hematoma > 3 cm Outcome: Completed/Met Date Met:  04/30/12 Level 0

## 2012-04-30 NOTE — Preoperative (Signed)
Beta Blockers   Reason not to administer Beta Blockers:Not Applicable 

## 2012-04-30 NOTE — Anesthesia Procedure Notes (Signed)
Procedure Name: Intubation Date/Time: 04/30/2012 11:10 AM Performed by: Lovie Chol Pre-anesthesia Checklist: Patient identified, Emergency Drugs available, Suction available, Patient being monitored and Timeout performed Patient Re-evaluated:Patient Re-evaluated prior to inductionOxygen Delivery Method: Circle system utilized Preoxygenation: Pre-oxygenation with 100% oxygen Intubation Type: IV induction Ventilation: Mask ventilation without difficulty and Oral airway inserted - appropriate to patient size Laryngoscope Size: Miller and 2 Grade View: Grade I Tube type: Oral Tube size: 8.0 mm Number of attempts: 1 Airway Equipment and Method: Stylet Placement Confirmation: ETT inserted through vocal cords under direct vision,  positive ETCO2,  CO2 detector and breath sounds checked- equal and bilateral Secured at: 22 cm Tube secured with: Tape Dental Injury: Teeth and Oropharynx as per pre-operative assessment

## 2012-04-30 NOTE — H&P (Signed)
Vascular and Vein Specialist of [facility]  Patient name: Derek Frye MRN: [id number] DOB: 1941/02/22 Sex: male  Chief Complaint   Patient presents with   .  AAA     F/up with CT scan today from [facility]   HISTORY OF PRESENT ILLNESS:  This is a patient that I have followed for lower extremity arterial insufficiency as well as an abdominal aortic aneurysm. I inherited him from Dr. Madilyn Fireman. As recently as May 2013, his aneurysm measured 5.1 mm. In December and ultrasound suggested that it had increased in size, and therefore a CT scan was ordered. He is back today to discuss those results. He reports no bowel pain.  The patient was recently seen by Dr. Myrtis Ser, his cardiologist. Carotid Dopplers revealed 40-59% stenosis bilaterally. The patient continues to be on Coumadin for atrial fibrillation.  The patient has undergone left leg a dictation in 2012 secondary to a nonhealing wound. He is medically managed for his hypertension and hyperlipidemia. He is on a statin. He also suffers from diabetes.  Past Medical History   Diagnosis  Date   .  HTN (hypertension)    .  Obesity    .  History of cholecystectomy    .  CAD (coronary artery disease)      Status post CABG followed by redo in 1994 / nuclear 2007, old infarct, no ischemia   .  Ejection fraction < 50%      EF 45%...echo 2004 / EF 45%, echo, August, 2012   .  Hyperlipidemia    .  Cigarette smoker      He is now smoking cigars and needs to quit.   .  Total knee replacement status      eft... infected and removed... no replacement until leg is stable   .  Prostate cancer      History of prostate cancer with a seed implant and this is stable.   .  Abdominal aortic aneurysm      is stable, being followed   .  Atrial fibrillation      Rate control / going in and out of atrial fib, Coumadin   .  Volume overload    .  Right bundle branch block      noted November 09, 2008.   .  Warfarin anticoagulation      Atrial fibrillation   .   Carotid artery disease      Doppler September 2 009, 40-50% LICA / Doppler October, 2010, 0-39% R. ICA 40-59% LICA   .  Diabetes mellitus    .  Myocardial infarction  1984   .  Ulcer    .  Peripheral vascular disease    .  Preop cardiovascular exam      Surgical clearance for left AKA October, 2012    Past Surgical History   Procedure  Date   .  Total knee arthroplasty    .  Coronary artery bypass graft  1994     x2   .  Anal fissure repair    .  Pr vein bypass graft,aorto-fem-pop    .  Leg amputation above knee  Oct. 23, 2012     Left Leg AKA    History    Social History   .  Marital Status:  Married     Spouse Name:  N/A     Number of Children:  N/A   .  Years of Education:  N/A    Occupational History   .  Not on file.    Social History Main Topics   .  Smoking status:  Current Some Day Smoker -- 0.5 packs/day for 30 years     Types:  Cigars, Cigarettes   .  Smokeless tobacco:  Never Used   .  Alcohol Use:  Yes      Comment: occasionally   .  Drug Use:  No   .  Sexually Active:  Not on file    Other Topics  Concern   .  Not on file    Social History Narrative   .  No narrative on file    Family History   Problem  Relation  Age of Onset   .  Stroke  Father  12   .  Diabetes  Other     Allergies as of 03/23/2012 - Review Complete 03/23/2012   Allergen  Reaction  Noted   .  Amoxicillin     .  Morphine  Nausea And Vomiting     Current Outpatient Prescriptions on File Prior to Visit   Medication  Sig  Dispense  Refill   .  docusate sodium (COLACE) 100 MG capsule  Take 100-200 mg by mouth daily as needed. For constipation     .  furosemide (LASIX) 40 MG tablet  Take 40 mg by mouth every morning. Take one (1) tablet by mouth daily.     Marland Kitchen  gabapentin (NEURONTIN) 100 MG capsule  Take 100 mg by mouth 3 (three) times daily.     .  magnesium gluconate (MAGONATE) 500 MG tablet  Take 500 mg by mouth daily.     .  metFORMIN (GLUCOPHAGE) 850 MG tablet  Take 850 mg by  mouth every morning.     .  metolazone (ZAROXOLYN) 2.5 MG tablet  Take 2.5 mg by mouth daily.     .  potassium chloride SA (K-DUR,KLOR-CON) 20 MEQ tablet  Take 40 mEq by mouth daily.     .  simvastatin (ZOCOR) 40 MG tablet  Take 1 tablet (40 mg total) by mouth at bedtime.  90 tablet  0   .  spironolactone (ALDACTONE) 25 MG tablet  Take 25 mg by mouth daily. Take one (1) tablet by mouth two (2) times a day.     .  warfarin (COUMADIN) 2.5 MG tablet  TAKE AS DIRECTED BY COUMADIN CLINIC  120 tablet  1   .  ENSURE (ENSURE)  Take 237 mLs by mouth 2 (two) times daily.     REVIEW OF SYSTEMS:  No changes from prior visit  PHYSICAL EXAMINATION:  Vital signs are BP 109/66  Pulse 59  Resp 16  Ht 5\' 11"  (1.803 m)  Wt 217 lb (98.431 kg)  BMI 30.27 kg/m2  SpO2 96%  General: The patient appears their stated age.  HEENT: No gross abnormalities  Pulmonary: Non labored breathing  Abdomen: Soft and non-tender  Musculoskeletal: Left leg is surgically absent  Neurologic: No focal weakness or paresthesias are detected,  Skin: There are no ulcer or rashes noted.  Psychiatric: The patient has normal affect.  Cardiovascular: There is a regular rate and rhythm without significant murmur appreciated. No carotid bruits. Palpable femoral pulses.  Diagnostic Studies  I have reviewed his CT angiogram. By my measurements the maximum diameter of the abdominal aortic aneurysm is 5.6 cm.  Assessment:  Abdominal aortic aneurysm  Plan:  The patient's aneurysm has increased in size, it now measures 5.6 cm. After reviewing the images, I  feel the patient is a good candidate for an endovascular repair, which I believe can be done percutaneously. I have discussed the risks and benefits of the procedure, including the risk of death, cardio-pulmonary  complications, intestinal ischemia, lower extremity ischemia. We also discussed that he would be at high risk of rupture should we not repair his aneurysm. He wishes to proceed. I  will get a formal cardiology clearance. He'll also need to be off of his Coumadin 5 days prior to his operation. His operation is been scheduled for February 20  V. Charlena Cross, M.D.  [facility] [place]  Office: [phone]  Pager: [phone]

## 2012-04-30 NOTE — OR Nursing (Signed)
Preoperative DP audible with doppler in right foot. Post operative Postitive DP audible with doppler.

## 2012-04-30 NOTE — Transfer of Care (Signed)
Immediate Anesthesia Transfer of Care Note  Patient: Derek Frye  Procedure(s) Performed: Procedure(s) with comments:  Ultrasound guided,   inserstion of  ABDOMINAL AORTIC ENDOVASCULAR STENT GRAFT (N/A) - EVAR- [place]  Patient Location: PACU  Anesthesia Type:General  Level of Consciousness: awake, alert , oriented and patient cooperative  Airway & Oxygen Therapy: Patient Spontanous Breathing and Patient connected to face mask oxygen  Post-op Assessment: Report given to PACU RN and Post -op Vital signs reviewed and stable  Post vital signs: Reviewed and stable  Complications: No apparent anesthesia complications

## 2012-04-30 NOTE — Anesthesia Preprocedure Evaluation (Addendum)
Anesthesia Evaluation  Patient identified by MRN, date of birth, ID band Patient awake    Reviewed: Allergy & Precautions, H&P , NPO status , Patient's Chart, lab work & pertinent test results  History of Anesthesia Complications Negative for: history of anesthetic complications  Airway Mallampati: II TM Distance: >3 FB Neck ROM: full    Dental  (+) Edentulous Upper and Dental Advisory Given   Pulmonary Current Smoker,    + decreased breath sounds      Cardiovascular hypertension, Pt. on medications + CAD, + Past MI, + CABG and + Peripheral Vascular Disease + dysrhythmias Atrial Fibrillation     Neuro/Psych negative neurological ROS     GI/Hepatic negative GI ROS, Neg liver ROS,   Endo/Other  diabetes, Type 2, Oral Hypoglycemic Agentsobese  Renal/GU negative Renal ROS     Musculoskeletal  (+) Arthritis -,   Abdominal   Peds  Hematology negative hematology ROS (+)   Anesthesia Other Findings   Reproductive/Obstetrics negative OB ROS                          Anesthesia Physical Anesthesia Plan  ASA: III  Anesthesia Plan: General   Post-op Pain Management:    Induction: Intravenous  Airway Management Planned: Oral ETT  Additional Equipment: Arterial line and CVP  Intra-op Plan:   Post-operative Plan: Extubation in OR  Informed Consent: I have reviewed the patients History and Physical, chart, labs and discussed the procedure including the risks, benefits and alternatives for the proposed anesthesia with the patient or authorized representative who has indicated his/her understanding and acceptance.     Plan Discussed with: CRNA and Surgeon  Anesthesia Plan Comments:         Anesthesia Quick Evaluation

## 2012-04-30 NOTE — Op Note (Signed)
[facility]  Patient name: Derek Frye MRN: [id number] DOB: 08-Oct-1940 Sex: male  04/30/2012 Pre-operative Diagnosis: Abdominal aortic aneurysm Post-operative diagnosis:  Same Surgeon:  Jorge Ny Assistants:  Doreatha Massed Procedure:   #1: Endovascular repair of abdominal aortic aneurysm   #2: Bilateral ultrasound guided common femoral artery access   #3: Catheter and aorta x2   #4: Abdominal aortogram   #5: Distal extension x1 Anesthesia:  Gen. Blood Loss:  See anesthesia record Specimens:  None  Findings:  Complete exclusion Devices used: Main body was primary left Gore Excluder 31 x 14 x 13. Distal ipsilateral extension was a Gore Excluder 23 x 10. Contralateral right was a Biomedical scientist 23 x 12  Indications:  The patient was found to have an asymptomatic abdominal aortic aneurysm which meets size criteria for repair. Imaging revealed that he is a good candidate for endovascular repair. The risks and benefits were discussed with the patient and his preoperative visit. He comes in today for his repair.  Procedure:  The patient was identified in the holding area and taken to [facility] OR ROOM 16  The patient was then placed supine on the table. general anesthesia was administered.  The patient was prepped and draped in the usual sterile fashion.  A time out was called and antibiotics were administered.  Ultrasound was used to evaluate bilateral common femoral arteries. They were widely patent with mild calcification. Under ultrasound guidance, bilateral common femoral arteries were accessed with an 18-gauge needle. An 035 wire was advanced into the aorta under fluoroscopic visualization. The dilator from an 8 French sheath was used to dilate the arteriotomy. Pro-glide devices were placed at the 11:00 and 1:00 position bilaterally for pre-closure. Next over the Amplatz superstiff wires an 18 French sheath was advanced up the left side and a 14 French  sheath advanced up the right side. The patient was fully heparinized. The main body was prepared on the back table. This was a Biomedical scientist 31 x 14 x 13. It was advanced through the sheath on the left side. An omni-flush catheter was advanced to the level of L1. An abdominal aortogram was performed, locating the renal arteries. The main body device was then deployed down to the contralateral gate landing just below the lowest renal artery. Next, using a Omni flush catheter and a Benson wire, the contralateral gate was cannulated. The Omni flush catheter was able to be freely rotated within the main body, confirming successful cannulation. The Bentson wire was removed and a Amplatz superstiff wire was placed. The image detector was rotated to a left anterior oblique position and a retrograde injection was performed through the sheath which located the right hypogastric artery. The contralateral right extension was prepared on the back table. This was a Biomedical scientist 23 x 12. It was advanced through the sheath and then successfully deployed landing at the level of the right hypogastric artery. Next, the remaining portion of the ipsilateral limb was deployed. A retrograde sheath injection was performed with the image detector in the right anterior oblique position. This located the left hypogastric artery. A distal left extension was placed. This was a 23 x 10 device. It landed just short of the left hypogastric artery. Next using the Q-50 balloon, the proximal and distal landing zones 4 molded as well as the device overlap. Through a Omni flush catheter, a completion arteriogram was performed. This showed successful exclusion of the aneurysm to preserve patency of both  renal arteries as well as both hypogastric arteries. At this point a stiff wires were replaced with Bentson wires. The sheaths were then removed and the pro-glide devices were used to close the arteriotomies. Both groins were hemostatic. 50 mg of  protamine was administered. Manual pressure was held for approximately 5 minutes. The subcutaneous tissue and skin were closed with 4-0 Vicryl. Dermabond placed on the skin. There were no complications.    Disposition:  To PACU in stable condition.   Juleen China, M.D. [facility] [place] Office: [phone] Pager:  [phone]

## 2012-04-30 NOTE — Anesthesia Postprocedure Evaluation (Signed)
Anesthesia Post Note  Patient: Derek Frye  Procedure(s) Performed: Procedure(s) (LRB):  Ultrasound guided,   inserstion of  ABDOMINAL AORTIC ENDOVASCULAR STENT GRAFT (N/A)  Anesthesia type: General  Patient location: PACU  Post pain: Pain level controlled and Adequate analgesia  Post assessment: Post-op Vital signs reviewed, Patient's Cardiovascular Status Stable, Respiratory Function Stable, Patent Airway and Pain level controlled  Last Vitals:  Filed Vitals:   04/30/12 1415  BP:   Pulse: 49  Temp:   Resp: 14    Post vital signs: Reviewed and stable  Level of consciousness: awake, alert  and oriented  Complications: No apparent anesthesia complications

## 2012-05-01 ENCOUNTER — Encounter (HOSPITAL_COMMUNITY): Payer: Self-pay | Admitting: Surgery

## 2012-05-01 LAB — CBC
MCV: 87.6 fL (ref 78.0–100.0)
Platelets: 127 10*3/uL — ABNORMAL LOW (ref 150–400)
RDW: 15.7 % — ABNORMAL HIGH (ref 11.5–15.5)
WBC: 8 10*3/uL (ref 4.0–10.5)

## 2012-05-01 LAB — HEMOGLOBIN A1C
Hgb A1c MFr Bld: 7.1 % — ABNORMAL HIGH (ref <5.7)
Mean Plasma Glucose: 157 mg/dL — ABNORMAL HIGH (ref <117)

## 2012-05-01 LAB — GLUCOSE, CAPILLARY
Glucose-Capillary: 177 mg/dL — ABNORMAL HIGH (ref 70–99)
Glucose-Capillary: 230 mg/dL — ABNORMAL HIGH (ref 70–99)

## 2012-05-01 LAB — BASIC METABOLIC PANEL
Chloride: 100 mEq/L (ref 96–112)
Creatinine, Ser: 0.9 mg/dL (ref 0.50–1.35)
GFR calc Af Amer: >90 mL/min (ref >90)
Sodium: 136 mEq/L (ref 135–145)

## 2012-05-01 LAB — PROTIME-INR: INR: 1.24 (ref 0.00–1.49)

## 2012-05-01 MED ORDER — DIPHENHYDRAMINE HCL 50 MG/ML IJ SOLN
INTRAMUSCULAR | Status: AC
  Administered 2012-05-01: 25 mg via INTRAVENOUS
  Filled 2012-05-01: qty 1

## 2012-05-01 MED ORDER — SODIUM CHLORIDE 0.9 % IJ SOLN
INTRAMUSCULAR | Status: AC
  Administered 2012-05-01: 3 mL
  Filled 2012-05-01: qty 20

## 2012-05-01 MED ORDER — DIPHENHYDRAMINE HCL 50 MG/ML IJ SOLN: 25.0000 mg | Freq: Once | INTRAMUSCULAR | Status: AC

## 2012-05-01 MED ORDER — ENOXAPARIN SODIUM 40 MG/0.4ML ~~LOC~~ SOLN
40.0000 mg | SUBCUTANEOUS | Status: DC
  Administered 2012-05-01: 40 mg via SUBCUTANEOUS
  Filled 2012-05-01: qty 0.4

## 2012-05-01 NOTE — Progress Notes (Signed)
Pt d/c home per MD order, d/c instructions and prescriptions given, family at [facility], pt and family verbalized understanding of d/c, pt VSS, pt tol well, all questions answered

## 2012-05-01 NOTE — Progress Notes (Signed)
Pt called RN to room, pt c/o face feeling flushed and burring at IV site, RN observered Surrounding skin of IV site red, Vancomycin stopped immediately, IV flushed with 50cc NS, pt A&Ox4, VS stable, MD called, Pharmacy consulted for possible allergic reaction, New orders received from MD, pt tol well

## 2012-05-01 NOTE — Discharge Summary (Signed)
[facility] Discharge Summary  Derek Frye 30-Oct-1940 72 y.o. male  [id number]  Admission Date: 04/30/2012  Discharge Date: 05/01/12  Physician: Nada Libman, MD  Admission Diagnosis: Abdominal Aortic Aneurysm   HPI:   This is a 72 y.o. male who is a patient that I have followed for lower extremity arterial insufficiency as well as an abdominal aortic aneurysm. I inherited him from Dr. Madilyn Fireman. As recently as May 2013, his aneurysm measured 5.1 mm. In December and ultrasound suggested that it had increased in size, and therefore a CT scan was ordered. He is back today to discuss those results. He reports no bowel pain.  The patient was recently seen by Dr. Myrtis Ser, his cardiologist. Carotid Dopplers revealed 40-59% stenosis bilaterally. The patient continues to be on Coumadin for atrial fibrillation.  The patient has undergone left leg a dictation in 2012 secondary to a nonhealing wound. He is medically managed for his hypertension and hyperlipidemia. He is on a statin. He also suffers from diabetes.   Hospital Course:  The patient was admitted to the hospital and taken to the operating room on 04/30/2012 and underwent  #1: Endovascular repair of abdominal aortic aneurysm  #2: Bilateral ultrasound guided common femoral artery access  #3: Catheter and aorta x2  #4: Abdominal aortogram  #5: Distal extension x1    The pt tolerated the procedure well and was transported to the PACU in good condition. By POD 1, he was doing well.  It was discussed with the pt that he is to resume his regular coumadin regimen and have his INR checked on Monday or Tuesday.  He is also given a dose of lovenox before discharge.  The remainder of the hospital course consisted of increasing mobilization and increasing intake of solids without difficulty.  CBC    Component Value Date/Time   WBC 8.0 05/01/2012 0440   RBC 3.71* 05/01/2012 0440   HGB 11.3* 05/01/2012 0440   HCT 32.5*  05/01/2012 0440   PLT 127* 05/01/2012 0440   MCV 87.6 05/01/2012 0440   MCH 30.5 05/01/2012 0440   MCHC 34.8 05/01/2012 0440   RDW 15.7* 05/01/2012 0440   LYMPHSABS 1.9 01/07/2011 0635   MONOABS 1.3* 01/07/2011 0635   EOSABS 0.5 01/07/2011 0635   BASOSABS 0.1 01/07/2011 0635    BMET    Component Value Date/Time   NA 136 05/01/2012 0440   K 4.0 05/01/2012 0440   CL 100 05/01/2012 0440   CO2 28 05/01/2012 0440   GLUCOSE 152* 05/01/2012 0440   BUN 23 05/01/2012 0440   CREATININE 0.90 05/01/2012 0440   CREATININE 1.00 03/20/2012 1432   CALCIUM 9.0 05/01/2012 0440   GFRNONAA 82* 05/01/2012 0440   GFRAA >90 05/01/2012 0440     Discharge Instructions:   The patient is discharged to home with extensive instructions on wound care and progressive ambulation.  They are instructed not to drive or perform any heavy lifting until returning to see the physician in his office.  Discharge Orders   Future Appointments Provider Department Dept Phone   06/01/2012 11:45 AM Nada Libman, MD [facility] [phone]   Future Orders Complete By Expires     ABDOMINAL PROCEDURE/ANEURYSM REPAIR/AORTO-BIFEMORAL BYPASS:  Call MD for increased abdominal pain; cramping diarrhea; nausea/vomiting  As directed     Call MD for:  redness, tenderness, or signs of infection (pain, swelling, bleeding, redness, odor or green/yellow discharge around incision site)  As directed  Call MD for:  severe or increased pain, loss or decreased feeling  in affected limb(s)  As directed     Call MD for:  temperature >100.5  As directed     Discharge wound care:  As directed     Comments:      Shower daily with soap and water starting 05/02/12    Driving Restrictions  As directed     Comments:      No driving for 2 weeks    Lifting restrictions  As directed     Comments:      No lifting for 6 weeks    Resume previous diet  As directed        Discharge Diagnosis:  Abdominal Aortic  Aneurysm  Secondary Diagnosis: Patient Active Problem List  Diagnosis  . DYSLIPIDEMIA  . Fluid overload  . RBBB  . AAA  . PERIPHERAL VASCULAR DISEASE  . CELLULITIS AND ABSCESS OF LEG EXCEPT FOOT  . Pyogenic arthritis, lower leg  . TOBACCO ABUSE, HX OF  . STREPTOCOCCUS INFECTION CCE & UNS SITE GROUP B  . Encounter for long-term (current) use of anticoagulants  . HTN (hypertension)  . Obesity  . CAD (coronary artery disease)  . Hyperlipidemia  . Cigarette smoker  . Atrial fibrillation  . Volume overload  . Right bundle branch block  . Warfarin anticoagulation  . Carotid artery disease  . 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 Gram-negative infection  . Ejection fraction < 50%  . Preop cardiovascular exam   Past Medical History  Diagnosis Date  . HTN (hypertension)   . Obesity   . History of cholecystectomy   . CAD (coronary artery disease)     Status post CABG followed by redo in 1994 / nuclear 2007, old infarct, no ischemia  . Ejection fraction < 50%     EF  45%...echo 2004 /  EF 45%, echo, August, 2012  . Hyperlipidemia   . Cigarette smoker     He is now smoking cigars and needs to quit.  . 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 no replacement until leg is stable  . Prostate cancer     History of prostate cancer with a seed implant and this is stable.  . Abdominal aortic aneurysm     is stable, being followed  . Atrial fibrillation     Rate control / going in and out of atrial fib, Coumadin  . Volume overload   . Right bundle branch block     noted November 09, 2008.  . Warfarin anticoagulation     Atrial fibrillation  . Carotid artery disease     Doppler September 2 009, 40-50% LICA / Doppler October, 2010, 0-39% R. ICA 40-59% LICA  . Diabetes mellitus   . Myocardial infarction 1984  . Ulcer   . Peripheral vascular disease   . 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 Heart murmur   .  Arthritis        Medication List    TAKE these medications       docusate sodium 100 MG capsule  Commonly known as:  COLACE  Take 100-200 mg by mouth daily as needed. For constipation     furosemide 40 MG tablet  Commonly known as:  LASIX  Take 40 mg by mouth every morning. Take one (1) tablet by mouth daily.     gabapentin  100 MG capsule  Commonly known as:  NEURONTIN  Take 100 mg by mouth 3 (three) times daily as needed. For phantom pain     magnesium gluconate 500 MG tablet  Commonly known as:  MAGONATE  Take 500 mg by mouth daily.     metFORMIN 850 MG tablet  Commonly known as:  GLUCOPHAGE  Take 850 mg by mouth every morning.     metolazone 2.5 MG tablet  Commonly known as:  ZAROXOLYN  Take 2.5 mg by mouth daily.     oxyCODONE 5 MG immediate release tablet  Commonly known as:  ROXICODONE  Take 1 tablet (5 mg total) by mouth every 6 (six) hours as needed for pain.     potassium chloride SA 20 MEQ tablet  Commonly known as:  K-DUR,KLOR-CON  Take 40 mEq by mouth daily.     simvastatin 40 MG tablet  Commonly known as:  ZOCOR  Take 1 tablet (40 mg total) by mouth at bedtime.     spironolactone 25 MG tablet  Commonly known as:  ALDACTONE  Take 25 mg by mouth daily. Take one (1) tablet by mouth two (2) times a day.     warfarin 2.5 MG tablet  Commonly known as:  COUMADIN  Take 1.25-2.5 mg by mouth daily. Takes 1.25 mg on sun, tue, thu, fri and 2.5 mg all other days       Roxicodone #30 NR given Disposition: home  Patient's condition: is Good  Follow up: 1. Dr. Myra Gianotti in 4 weeks with CTA   Doreatha Massed, PA-C [facility] [phone] 05/01/2012  7:47 AM

## 2012-05-01 NOTE — Progress Notes (Signed)
Inpatient Diabetes Program Recommendations  AACE/ADA: New Consensus Statement on Inpatient Glycemic Control (2013)  Target Ranges:  Prepandial:   less than 140 mg/dL      Peak postprandial:   less than 180 mg/dL (1-2 hours)      Critically ill patients:  140 - 180 mg/dL   Results for CLEVER, GERALDO (MRN [id number]) as of 05/01/2012 08:35  Ref. Range 04/30/2012 09:25 04/30/2012 14:18 04/30/2012 18:32 04/30/2012 21:34 05/01/2012 07:50  Glucose-Capillary Latest Range: 70-99 mg/dL 578 (H) 469 (H) 629 (H) 241 (H) 177 (H)    Inpatient Diabetes Program Recommendations Diet: Please consider changing the diet to Carbohydrate Modified Diabetic diet.  Note: Patient has a history of diabetes and takes Metformin 850 mg daily at home for diabetes management.  Currently patient is ordered Novolog moderate correction AC (which was ordered to start this morning) and Metformin 850 mg daily for inpatient glycemic control.  Please consider changing the diet from Regular to Carbohydrate Modified Diabetic diet.  Will continue to follow.  Thanks, Orlando Penner, RN, BSN, CCRN Diabetes Coordinator Inpatient Diabetes Program [phone]

## 2012-05-01 NOTE — Progress Notes (Signed)
Utilization review completed.  

## 2012-05-01 NOTE — Progress Notes (Signed)
[facility] Progress Note  05/01/2012 7:34 AM POD 1  Subjective:  Ready to go home  Tm 99.4 HR  40's-70's irregular 94% RA  Filed Vitals:   05/01/12 0425  BP: 121/54  Pulse: 57  Temp: 99.4 F (37.4 C)  Resp: 18     Physical Exam: Cardiac:  irreg Lungs:  CTAB Abdomen:  Soft/NT/ND Incisions:  Bilateral groins are soft without hematoma Extremities:  +doppler pulse right foot  CBC    Component Value Date/Time   WBC 8.0 05/01/2012 0440   RBC 3.71* 05/01/2012 0440   HGB 11.3* 05/01/2012 0440   HCT 32.5* 05/01/2012 0440   PLT 127* 05/01/2012 0440   MCV 87.6 05/01/2012 0440   MCH 30.5 05/01/2012 0440   MCHC 34.8 05/01/2012 0440   RDW 15.7* 05/01/2012 0440   LYMPHSABS 1.9 01/07/2011 0635   MONOABS 1.3* 01/07/2011 0635   EOSABS 0.5 01/07/2011 0635   BASOSABS 0.1 01/07/2011 0635    BMET    Component Value Date/Time   NA 136 05/01/2012 0440   K 4.0 05/01/2012 0440   CL 100 05/01/2012 0440   CO2 28 05/01/2012 0440   GLUCOSE 152* 05/01/2012 0440   BUN 23 05/01/2012 0440   CREATININE 0.90 05/01/2012 0440   CREATININE 1.00 03/20/2012 1432   CALCIUM 9.0 05/01/2012 0440   GFRNONAA 82* 05/01/2012 0440   GFRAA >90 05/01/2012 0440    INR    Component Value Date/Time   INR 1.24 05/01/2012 0440   INR 2.0 04/13/2012 1241   INR 2.19 05/16/2010 1609     Intake/Output Summary (Last 24 hours) at 05/01/12 0734 Last data filed at 05/01/12 0500  Gross per 24 hour  Intake 3653.75 ml  Output    910 ml  Net 2743.75 ml     Assessment/Plan:  72 y.o. male is s/p  #1: Endovascular repair of abdominal aortic aneurysm  #2: Bilateral ultrasound guided common femoral artery access  #3: Catheter and aorta x2  #4: Abdominal aortogram  #5: Distal extension x1    POD 1  -BUN/Cr WNL -d/w pt to get INR checked this coming Monday or Tuesday -he will resume his regular coumadin regimen today. -will give a dose of lovenox this am. -discharge home -f/u with Dr. Myra Gianotti in 4 weeks  with CTA   Doreatha Massed, PA-C [facility] [phone] 05/01/2012 7:34 AM

## 2012-05-03 NOTE — Discharge Summary (Signed)
I agree with the above. The patient tolerated his aneurysm repair without difficulty. He is stable for discharge.  Durene Cal

## 2012-05-04 ENCOUNTER — Ambulatory Visit (INDEPENDENT_AMBULATORY_CARE_PROVIDER_SITE_OTHER): Payer: Medicare Other | Admitting: Pharmacist

## 2012-05-04 DIAGNOSIS — I4891 Unspecified atrial fibrillation: Secondary | ICD-10-CM

## 2012-05-04 DIAGNOSIS — Z8679 Personal history of other diseases of the circulatory system: Secondary | ICD-10-CM

## 2012-05-04 DIAGNOSIS — Z7901 Long term (current) use of anticoagulants: Secondary | ICD-10-CM

## 2012-05-18 ENCOUNTER — Ambulatory Visit (INDEPENDENT_AMBULATORY_CARE_PROVIDER_SITE_OTHER): Payer: Medicare Other | Admitting: Pharmacist

## 2012-05-18 DIAGNOSIS — Z7901 Long term (current) use of anticoagulants: Secondary | ICD-10-CM

## 2012-05-29 ENCOUNTER — Encounter: Payer: Self-pay | Admitting: Surgery

## 2012-05-29 ENCOUNTER — Other Ambulatory Visit: Payer: Self-pay

## 2012-06-01 ENCOUNTER — Ambulatory Visit (INDEPENDENT_AMBULATORY_CARE_PROVIDER_SITE_OTHER): Payer: Medicare Other | Admitting: Surgery

## 2012-06-01 ENCOUNTER — Encounter: Payer: Self-pay | Admitting: Surgery

## 2012-06-01 ENCOUNTER — Ambulatory Visit
Admission: RE | Admit: 2012-06-01 | Discharge: 2012-06-01 | Disposition: A | Discharge status: Home / Self Care | Payer: Medicare Other | Source: Ambulatory Visit | Attending: Surgery | Admitting: Surgery

## 2012-06-01 VITALS — BP 118/54 | HR 49 | Resp 16 | Ht 71.0 in | Wt 230.0 lb

## 2012-06-01 DIAGNOSIS — I714 Abdominal aortic aneurysm, without rupture: Secondary | ICD-10-CM

## 2012-06-01 DIAGNOSIS — Z48812 Encounter for surgical aftercare following surgery on the circulatory system: Secondary | ICD-10-CM

## 2012-06-01 MED ORDER — IOHEXOL 350 MG/ML SOLN
100.0000 mL | Freq: Once | INTRAVENOUS | Status: AC | PRN
  Administered 2012-06-01: 100 mL via INTRAVENOUS

## 2012-06-01 NOTE — Progress Notes (Signed)
The patient is back for his first postoperative followup. He is status post percutaneous endovascular repair of an abdominal aortic aneurysm on 04/30/2012. A Gore Excluder device was deployed. His postoperative course was uncomplicated. He is back today for followup without complaints.  Both groin incisions are well-healed.  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 There has been no significant interval change in the aneurysm size. I have discussed these findings with the patient and his wife. Plans at this time out for a repeat CT scan and evaluation in 6 months.

## 2012-06-02 NOTE — Addendum Note (Signed)
Addended by: Dannielle Karvonen on: 06/02/2012 04:11 PM   Modules accepted: Orders

## 2012-06-08 ENCOUNTER — Other Ambulatory Visit: Payer: Self-pay

## 2012-06-08 ENCOUNTER — Telehealth: Payer: Self-pay | Admitting: Cardiology

## 2012-06-08 MED ORDER — SIMVASTATIN 40 MG PO TABS: 40.0000 mg | ORAL_TABLET | Freq: Every day | ORAL | Status: DC

## 2012-06-08 NOTE — Telephone Encounter (Signed)
Derek Frye states his Simvastatin was refused.  I sent a new rx to [facility] per his request.

## 2012-06-08 NOTE — Telephone Encounter (Signed)
NEW PROBLEM   Pt received a letter from [facility] stating they can't renew his medication for Simvastatin 40mg . Pt need to talk to nurse concerning this matter

## 2012-06-15 ENCOUNTER — Ambulatory Visit (INDEPENDENT_AMBULATORY_CARE_PROVIDER_SITE_OTHER): Payer: Medicare Other

## 2012-06-15 DIAGNOSIS — Z7901 Long term (current) use of anticoagulants: Secondary | ICD-10-CM

## 2012-07-07 ENCOUNTER — Encounter (HOSPITAL_COMMUNITY): Payer: Self-pay | Admitting: Emergency Medicine

## 2012-07-07 ENCOUNTER — Inpatient Hospital Stay (HOSPITAL_COMMUNITY)
Admission: EM | Admit: 2012-07-07 | Discharge: 2012-07-17 | DRG: 486 | Disposition: A | Discharge status: Home Health | Payer: Medicare Other | Attending: Internal Medicine | Admitting: Internal Medicine

## 2012-07-07 DIAGNOSIS — Z794 Long term (current) use of insulin: Secondary | ICD-10-CM

## 2012-07-07 DIAGNOSIS — F17200 Nicotine dependence, unspecified, uncomplicated: Secondary | ICD-10-CM | POA: Diagnosis present

## 2012-07-07 DIAGNOSIS — I2510 Atherosclerotic heart disease of native coronary artery without angina pectoris: Secondary | ICD-10-CM

## 2012-07-07 DIAGNOSIS — I739 Peripheral vascular disease, unspecified: Secondary | ICD-10-CM

## 2012-07-07 DIAGNOSIS — Z96659 Presence of unspecified artificial knee joint: Secondary | ICD-10-CM

## 2012-07-07 DIAGNOSIS — R197 Diarrhea, unspecified: Secondary | ICD-10-CM | POA: Diagnosis not present

## 2012-07-07 DIAGNOSIS — I872 Venous insufficiency (chronic) (peripheral): Secondary | ICD-10-CM | POA: Diagnosis present

## 2012-07-07 DIAGNOSIS — T8450XA Infection and inflammatory reaction due to unspecified internal joint prosthesis, initial encounter: Principal | ICD-10-CM | POA: Diagnosis present

## 2012-07-07 DIAGNOSIS — Z7901 Long term (current) use of anticoagulants: Secondary | ICD-10-CM

## 2012-07-07 DIAGNOSIS — L03119 Cellulitis of unspecified part of limb: Secondary | ICD-10-CM | POA: Diagnosis present

## 2012-07-07 DIAGNOSIS — T8452XD Infection and inflammatory reaction due to internal left hip prosthesis, subsequent encounter: Secondary | ICD-10-CM

## 2012-07-07 DIAGNOSIS — I4891 Unspecified atrial fibrillation: Secondary | ICD-10-CM

## 2012-07-07 DIAGNOSIS — R011 Cardiac murmur, unspecified: Secondary | ICD-10-CM | POA: Diagnosis present

## 2012-07-07 DIAGNOSIS — N179 Acute kidney failure, unspecified: Secondary | ICD-10-CM | POA: Diagnosis present

## 2012-07-07 DIAGNOSIS — E785 Hyperlipidemia, unspecified: Secondary | ICD-10-CM | POA: Diagnosis present

## 2012-07-07 DIAGNOSIS — Z8546 Personal history of malignant neoplasm of prostate: Secondary | ICD-10-CM

## 2012-07-07 DIAGNOSIS — I252 Old myocardial infarction: Secondary | ICD-10-CM

## 2012-07-07 DIAGNOSIS — E669 Obesity, unspecified: Secondary | ICD-10-CM

## 2012-07-07 DIAGNOSIS — D638 Anemia in other chronic diseases classified elsewhere: Secondary | ICD-10-CM | POA: Diagnosis present

## 2012-07-07 DIAGNOSIS — I714 Abdominal aortic aneurysm, without rupture, unspecified: Secondary | ICD-10-CM

## 2012-07-07 DIAGNOSIS — M009 Pyogenic arthritis, unspecified: Secondary | ICD-10-CM | POA: Diagnosis present

## 2012-07-07 DIAGNOSIS — L03115 Cellulitis of right lower limb: Secondary | ICD-10-CM

## 2012-07-07 DIAGNOSIS — Z6830 Body mass index (BMI) 30.0-30.9, adult: Secondary | ICD-10-CM

## 2012-07-07 DIAGNOSIS — S78119A Complete traumatic amputation at level between unspecified hip and knee, initial encounter: Secondary | ICD-10-CM

## 2012-07-07 DIAGNOSIS — Z79899 Other long term (current) drug therapy: Secondary | ICD-10-CM

## 2012-07-07 DIAGNOSIS — I798 Other disorders of arteries, arterioles and capillaries in diseases classified elsewhere: Secondary | ICD-10-CM | POA: Diagnosis present

## 2012-07-07 DIAGNOSIS — I10 Essential (primary) hypertension: Secondary | ICD-10-CM | POA: Diagnosis present

## 2012-07-07 DIAGNOSIS — Y831 Surgical operation with implant of artificial internal device as the cause of abnormal reaction of the patient, or of later complication, without mention of misadventure at the time of the procedure: Secondary | ICD-10-CM | POA: Diagnosis present

## 2012-07-07 DIAGNOSIS — Z951 Presence of aortocoronary bypass graft: Secondary | ICD-10-CM

## 2012-07-07 DIAGNOSIS — L02419 Cutaneous abscess of limb, unspecified: Secondary | ICD-10-CM

## 2012-07-07 DIAGNOSIS — E1159 Type 2 diabetes mellitus with other circulatory complications: Secondary | ICD-10-CM | POA: Diagnosis present

## 2012-07-07 LAB — COMPREHENSIVE METABOLIC PANEL
AST: 19 U/L (ref 0–37)
BUN: 27 mg/dL — ABNORMAL HIGH (ref 6–23)
CO2: 29 mEq/L (ref 19–32)
Calcium: 9.4 mg/dL (ref 8.4–10.5)
Creatinine, Ser: 1.07 mg/dL (ref 0.50–1.35)
GFR calc Af Amer: 77 mL/min — ABNORMAL LOW (ref >90)
GFR calc non Af Amer: 67 mL/min — ABNORMAL LOW (ref >90)
Glucose, Bld: 151 mg/dL — ABNORMAL HIGH (ref 70–99)
Total Bilirubin: 1.3 mg/dL — ABNORMAL HIGH (ref 0.3–1.2)

## 2012-07-07 LAB — CBC WITH DIFFERENTIAL/PLATELET
Basophils Absolute: 0 10*3/uL (ref 0.0–0.1)
Eosinophils Relative: 1 % (ref 0–5)
HCT: 34.1 % — ABNORMAL LOW (ref 39.0–52.0)
Hemoglobin: 11.4 g/dL — ABNORMAL LOW (ref 13.0–17.0)
Lymphocytes Relative: 7 % — ABNORMAL LOW (ref 12–46)
MCV: 87.9 fL (ref 78.0–100.0)
Monocytes Absolute: 1.7 10*3/uL — ABNORMAL HIGH (ref 0.1–1.0)
Monocytes Relative: 14 % — ABNORMAL HIGH (ref 3–12)
RDW: 16 % — ABNORMAL HIGH (ref 11.5–15.5)
WBC: 11.9 10*3/uL — ABNORMAL HIGH (ref 4.0–10.5)

## 2012-07-07 LAB — URINALYSIS, ROUTINE W REFLEX MICROSCOPIC
Ketones, ur: NEGATIVE mg/dL
Leukocytes, UA: NEGATIVE
Nitrite: NEGATIVE
Protein, ur: 30 mg/dL — AB
Urobilinogen, UA: 1 mg/dL (ref 0.0–1.0)

## 2012-07-07 LAB — SEDIMENTATION RATE: Sed Rate: 10 mm/hr (ref 0–16)

## 2012-07-07 LAB — GLUCOSE, CAPILLARY: Glucose-Capillary: 205 mg/dL — ABNORMAL HIGH (ref 70–99)

## 2012-07-07 MED ORDER — ACETAMINOPHEN 650 MG RE SUPP: 650.0000 mg | Freq: Four times a day (QID) | RECTAL | Status: DC | PRN

## 2012-07-07 MED ORDER — PIPERACILLIN-TAZOBACTAM 3.375 G IVPB
3.3750 g | Freq: Three times a day (TID) | INTRAVENOUS | Status: DC
  Administered 2012-07-07 – 2012-07-13 (×17): 3.375 g via INTRAVENOUS
  Filled 2012-07-07 (×18): qty 50

## 2012-07-07 MED ORDER — SODIUM CHLORIDE 0.9 % IJ SOLN
3.0000 mL | Freq: Two times a day (BID) | INTRAMUSCULAR | Status: DC
  Administered 2012-07-11 – 2012-07-14 (×5): 3 mL via INTRAVENOUS

## 2012-07-07 MED ORDER — INSULIN ASPART 100 UNIT/ML ~~LOC~~ SOLN
4.0000 [IU] | Freq: Three times a day (TID) | SUBCUTANEOUS | Status: DC
  Administered 2012-07-08 (×2): 4 [IU] via SUBCUTANEOUS

## 2012-07-07 MED ORDER — HYDROMORPHONE HCL PF 1 MG/ML IJ SOLN
1.0000 mg | INTRAMUSCULAR | Status: DC | PRN
  Administered 2012-07-07 – 2012-07-08 (×3): 1 mg via INTRAVENOUS
  Filled 2012-07-07 (×3): qty 1

## 2012-07-07 MED ORDER — SIMVASTATIN 40 MG PO TABS
40.0000 mg | ORAL_TABLET | Freq: Every day | ORAL | Status: DC
  Administered 2012-07-07 – 2012-07-16 (×10): 40 mg via ORAL
  Filled 2012-07-07 (×12): qty 1

## 2012-07-07 MED ORDER — SPIRONOLACTONE 25 MG PO TABS
25.0000 mg | ORAL_TABLET | Freq: Two times a day (BID) | ORAL | Status: DC
  Administered 2012-07-07 – 2012-07-17 (×18): 25 mg via ORAL
  Filled 2012-07-07 (×21): qty 1

## 2012-07-07 MED ORDER — GABAPENTIN 100 MG PO CAPS
100.0000 mg | ORAL_CAPSULE | Freq: Three times a day (TID) | ORAL | Status: DC
  Administered 2012-07-07 – 2012-07-17 (×29): 100 mg via ORAL
  Filled 2012-07-07 (×31): qty 1

## 2012-07-07 MED ORDER — ACETAMINOPHEN 325 MG PO TABS
650.0000 mg | ORAL_TABLET | Freq: Four times a day (QID) | ORAL | Status: DC | PRN
  Administered 2012-07-08 – 2012-07-11 (×3): 650 mg via ORAL
  Filled 2012-07-07 (×3): qty 2

## 2012-07-07 MED ORDER — POTASSIUM CHLORIDE CRYS ER 20 MEQ PO TBCR
40.0000 meq | EXTENDED_RELEASE_TABLET | Freq: Every day | ORAL | Status: DC
  Administered 2012-07-07 – 2012-07-16 (×9): 40 meq via ORAL
  Filled 2012-07-07 (×11): qty 2

## 2012-07-07 MED ORDER — SODIUM CHLORIDE 0.9 % IV SOLN: 250.0000 mL | INTRAVENOUS | Status: DC | PRN

## 2012-07-07 MED ORDER — VANCOMYCIN HCL IN DEXTROSE 1-5 GM/200ML-% IV SOLN
1000.0000 mg | Freq: Two times a day (BID) | INTRAVENOUS | Status: DC
  Administered 2012-07-07 – 2012-07-08 (×3): 1000 mg via INTRAVENOUS
  Filled 2012-07-07 (×4): qty 200

## 2012-07-07 MED ORDER — OXYCODONE HCL 5 MG PO TABS
5.0000 mg | ORAL_TABLET | ORAL | Status: DC | PRN
  Administered 2012-07-09 – 2012-07-13 (×4): 5 mg via ORAL
  Filled 2012-07-07 (×4): qty 1

## 2012-07-07 MED ORDER — CLINDAMYCIN PHOSPHATE 600 MG/50ML IV SOLN
600.0000 mg | Freq: Once | INTRAVENOUS | Status: AC
  Administered 2012-07-07: 600 mg via INTRAVENOUS
  Filled 2012-07-07: qty 50

## 2012-07-07 MED ORDER — SODIUM CHLORIDE 0.9 % IJ SOLN
3.0000 mL | INTRAMUSCULAR | Status: DC | PRN
  Administered 2012-07-14: 3 mL via INTRAVENOUS

## 2012-07-07 MED ORDER — METOLAZONE 2.5 MG PO TABS
2.5000 mg | ORAL_TABLET | Freq: Every day | ORAL | Status: DC
  Administered 2012-07-08 – 2012-07-17 (×10): 2.5 mg via ORAL
  Filled 2012-07-07 (×10): qty 1

## 2012-07-07 MED ORDER — FUROSEMIDE 40 MG PO TABS
40.0000 mg | ORAL_TABLET | Freq: Every day | ORAL | Status: DC
  Administered 2012-07-08: 40 mg via ORAL
  Filled 2012-07-07 (×2): qty 1

## 2012-07-07 MED ORDER — MAGNESIUM GLUCONATE 500 MG PO TABS
500.0000 mg | ORAL_TABLET | Freq: Every day | ORAL | Status: DC
  Administered 2012-07-07 – 2012-07-16 (×10): 500 mg via ORAL
  Administered 2012-07-17: 10:00:00 via ORAL
  Filled 2012-07-07 (×11): qty 1

## 2012-07-07 MED ORDER — HYDROMORPHONE HCL PF 1 MG/ML IJ SOLN
1.0000 mg | Freq: Once | INTRAMUSCULAR | Status: AC
  Administered 2012-07-07: 1 mg via INTRAVENOUS
  Filled 2012-07-07: qty 1

## 2012-07-07 MED ORDER — INSULIN ASPART 100 UNIT/ML ~~LOC~~ SOLN
0.0000 [IU] | Freq: Three times a day (TID) | SUBCUTANEOUS | Status: DC
  Administered 2012-07-08 (×2): 3 [IU] via SUBCUTANEOUS
  Administered 2012-07-08 – 2012-07-09 (×2): 5 [IU] via SUBCUTANEOUS
  Administered 2012-07-09: 3 [IU] via SUBCUTANEOUS
  Administered 2012-07-09: 5 [IU] via SUBCUTANEOUS

## 2012-07-07 NOTE — H&P (Signed)
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 He presented to the hospital today directly from Dr. Nilsa Nutting office. He went there for evaluation of right lower extremity edema and redness that he first noticed about 48 hours ago. It has gotten progressively worse and is now involving the right knee. Per report of EDP who spoke with Dr. Charlann Boxer it appears that he is planning tomorrow on taking him to the OR to wash out his knee and has advised a medical admission for antibiotics. We have been asked to admit him for further evaluation and management. Please note the patient denies fever, chills or any other symptoms.  Allergies:   Allergies  Allergen Reactions  . Amoxicillin     REACTION: swelling and a rash  . Morphine Nausea And Vomiting  . Vancomycin     Pt c/o of feeling flushed and red, arm red around IV site      Past Medical History  Diagnosis Date  . HTN (hypertension)   . Obesity   . History of cholecystectomy   . CAD (coronary artery disease)     Status post CABG followed by redo in 1994 / nuclear 2007, old infarct, no ischemia  . Ejection fraction < 50%     EF  45%...echo 2004 /  EF 45%, echo, August, 2012  . Hyperlipidemia   . Cigarette smoker     He is now smoking cigars and needs to quit.  . 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 no replacement until leg is stable  . Prostate cancer     History of prostate cancer with a seed implant and this is stable.  . Abdominal aortic aneurysm     is stable, being followed  .  Atrial fibrillation     Rate control / going in and out of atrial fib, Coumadin  . Volume overload   . Right bundle branch block     noted November 09, 2008.  . Warfarin anticoagulation     Atrial fibrillation  . Carotid artery disease     Doppler September 2 009, 40-50% LICA / Doppler October, 2010, 0-39% R. ICA 40-59% LICA  . Diabetes mellitus   . Myocardial infarction 1984  . Ulcer   . Peripheral vascular disease   . 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 Heart murmur   . Arthritis     Past Surgical History  Procedure Laterality Date  . Total knee arthroplasty    . Coronary artery bypass graft  1994    x2  . Anal fissure repair    . Pr vein bypass graft,aorto-fem-pop    . Leg amputation above knee  Oct. 23, 2012    Left Leg AKA  . Joint replacement      bil knee  . Abdominal aortic endovascular stent graft N/A 04/30/2012    Procedure:  Ultrasound guided,   inserstion of  ABDOMINAL AORTIC ENDOVASCULAR STENT GRAFT;  Surgeon: Nada Libman, MD;  Location: [facility] OR;  Service: Vascular;  Laterality: N/A;  EVAR- Gore    Prior to Admission medications   Medication Sig Start Date End Date Taking? Authorizing Provider  docusate sodium (COLACE) 100 MG capsule Take 100-200 mg by mouth daily as needed. For constipation   Yes Historical Provider, MD  furosemide (LASIX) 40 MG tablet Take 40 mg by mouth every evening. Take one (1) tablet by mouth daily.   Yes Historical Provider, MD  gabapentin (NEURONTIN) 100 MG capsule Take 100 mg by mouth 3 (three) times daily as needed. For phantom pain   Yes Historical Provider, MD  magnesium gluconate (MAGONATE) 500 MG tablet Take 500 mg by mouth daily.    Yes Historical Provider, MD  metFORMIN (GLUCOPHAGE) 850 MG tablet Take 850 mg by mouth 2 (two) times daily with a meal.    Yes Historical Provider, MD  metolazone (ZAROXOLYN) 2.5 MG tablet Take 2.5 mg by mouth daily.    Yes Historical Provider, MD  potassium  chloride SA (K-DUR,KLOR-CON) 20 MEQ tablet Take 40 mEq by mouth every evening.    Yes Historical Provider, MD  simvastatin (ZOCOR) 40 MG tablet Take 1 tablet (40 mg total) by mouth at bedtime. 06/08/12  Yes Luis Abed, MD  spironolactone (ALDACTONE) 25 MG tablet Take 25 mg by mouth 2 (two) times daily. Take one (1) tablet by mouth two (2) times a day.   Yes Historical Provider, MD  warfarin (COUMADIN) 2.5 MG tablet Take 1.25-2.5 mg by mouth every evening. Takes 1.25 mg on sun, tue, thu, fri and 2.5 mg all other days   Yes Historical Provider, MD    Social History:  reports that he has been smoking Cigars and Cigarettes.  He has a 15 pack-year smoking history. He has never used smokeless tobacco. He reports that he does not drink alcohol or use illicit drugs.  Family History  Problem Relation Age of Onset  . Stroke Father 108  . Diabetes Other     Review of Systems:  Constitutional: Denies fever, chills, diaphoresis, appetite change and fatigue.  HEENT: Denies photophobia, eye pain, redness, hearing loss, ear pain, congestion, sore throat, rhinorrhea, sneezing, mouth sores, trouble swallowing, neck pain, neck stiffness and tinnitus.   Respiratory: Denies SOB, DOE, cough, chest tightness,  and wheezing.   Cardiovascular: Denies chest pain, palpitations and leg swelling.  Gastrointestinal: Denies nausea, vomiting, abdominal pain, diarrhea, constipation, blood in stool and abdominal distention.  Genitourinary: Denies dysuria, urgency, frequency, hematuria, flank pain and difficulty urinating.  Endocrine: Denies: hot or cold intolerance, sweats, changes in hair or nails, polyuria, polydipsia. Musculoskeletal: Denies myalgias, back pain, joint swelling, arthralgias and gait problem.  Skin: Denies pallor, rash and wound.  Neurological: Denies dizziness, seizures, syncope, weakness, light-headedness, numbness and headaches.  Hematological: Denies adenopathy. Easy bruising, personal or family  bleeding history  Psychiatric/Behavioral: Denies suicidal ideation, mood changes, confusion, nervousness, sleep disturbance and agitation   Physical Exam: Blood pressure 133/56, pulse 86, temperature 98.6 F (37 C), temperature source Oral, resp. rate 18, height 5\' 11"  (1.803 m), weight 99.791 kg (220 lb), SpO2 96.00%. General: Alert, awake, oriented x3, mild distress secondary to leg pain. HEENT: Normocephalic, atraumatic, pupils equal round and reactive to light, extraocular movements intact, was because membranes. Neck: Supple, no JVD, no lymphadenopathy, no bruits, no goiter. Cardiovascular:  Irregular, systolic ejection murmur present. Lungs: Clear to auscultation bilaterally. Abdomen: Obese, soft, nontender, nondistended, positive bowel sounds, no masses or organomegaly noted. Extremities: Above knee amputation on the left, the right has scar from a  knee replacement he has massive edema with purplish skin color change to the lower extremity and some streaky redness that extends to the knee area. Positive pulses on the right.  Labs on Admission:  Results for orders placed during the hospital encounter of 07/07/12 (from the past 48 hour(s))  CBC WITH DIFFERENTIAL     Status: Abnormal   Collection Time    07/07/12  3:00 PM      Result Value Range   WBC 11.9 (*) 4.0 - 10.5 K/uL   RBC 3.88 (*) 4.22 - 5.81 MIL/uL   Hemoglobin 11.4 (*) 13.0 - 17.0 g/dL   HCT 14.7 (*) 82.9 - 56.2 %   MCV 87.9  78.0 - 100.0 fL   MCH 29.4  26.0 - 34.0 pg   MCHC 33.4  30.0 - 36.0 g/dL   Comment: PRE-WARMING TECHNIQUE USED   RDW 16.0 (*) 11.5 - 15.5 %   Platelets 155  150 - 400 K/uL   Neutrophils Relative 78 (*) 43 - 77 %   Neutro Abs 9.3 (*) 1.7 - 7.7 K/uL   Lymphocytes Relative 7 (*) 12 - 46 %   Lymphs Abs 0.8  0.7 - 4.0 K/uL   Monocytes Relative 14 (*) 3 - 12 %   Monocytes Absolute 1.7 (*) 0.1 - 1.0 K/uL   Eosinophils Relative 1  0 - 5 %   Eosinophils Absolute 0.1  0.0 - 0.7 K/uL   Basophils  Relative 0  0 - 1 %   Basophils Absolute 0.0  0.0 - 0.1 K/uL  COMPREHENSIVE METABOLIC PANEL     Status: Abnormal   Collection Time    07/07/12  3:00 PM      Result Value Range   Sodium 134 (*) 135 - 145 mEq/L   Potassium 3.8  3.5 - 5.1 mEq/L   Chloride 94 (*) 96 - 112 mEq/L   CO2 29  19 - 32 mEq/L   Glucose, Bld 151 (*) 70 - 99 mg/dL   BUN 27 (*) 6 - 23 mg/dL   Creatinine, Ser 1.30  0.50 - 1.35 mg/dL   Calcium 9.4  8.4 - 86.5 mg/dL   Total Protein 7.9  6.0 - 8.3 g/dL   Albumin 3.4 (*) 3.5 - 5.2 g/dL   AST 19  0 - 37 U/L   ALT 13  0 - 53 U/L   Alkaline Phosphatase 114  39 - 117 U/L   Total Bilirubin 1.3 (*) 0.3 - 1.2 mg/dL   GFR calc non Af Amer 67 (*) >90 mL/min   GFR calc Af Amer 77 (*) >90 mL/min   Comment:            The eGFR has been calculated     using the CKD EPI equation.     This calculation has not been     validated in all clinical     situations.     eGFR's persistently     <90 mL/min signify     possible Chronic Kidney Disease.  SEDIMENTATION RATE     Status: None   Collection Time    07/07/12  3:00 PM      Result Value Range   Sed Rate 10  0 - 16 mm/hr  PROTIME-INR     Status: Abnormal   Collection Time    07/07/12  3:03 PM      Result Value Range   Prothrombin Time 24.1 (*) 11.6 - 15.2 seconds   INR 2.28 (*) 0.00 -  1.49  URINALYSIS, ROUTINE W REFLEX MICROSCOPIC     Status: Abnormal   Collection Time    07/07/12  6:18 PM      Result Value Range   Color, Urine AMBER (*) YELLOW   Comment: BIOCHEMICALS MAY BE AFFECTED BY COLOR   APPearance CLEAR  CLEAR   Specific Gravity, Urine 1.026  1.005 - 1.030   pH 5.0  5.0 - 8.0   Glucose, UA NEGATIVE  NEGATIVE mg/dL   Hgb urine dipstick NEGATIVE  NEGATIVE   Bilirubin Urine SMALL (*) NEGATIVE   Ketones, ur NEGATIVE  NEGATIVE mg/dL   Protein, ur 30 (*) NEGATIVE mg/dL   Urobilinogen, UA 1.0  0.0 - 1.0 mg/dL   Nitrite NEGATIVE  NEGATIVE   Leukocytes, UA NEGATIVE  NEGATIVE  URINE MICROSCOPIC-ADD ON     Status:  None   Collection Time    07/07/12  6:18 PM      Result Value Range   Squamous Epithelial / LPF RARE  RARE    Radiological Exams on Admission: No results found.  Assessment/Plan Principal Problem:   Cellulitis and abscess of lower leg Active Problems:   DYSLIPIDEMIA   AAA   PERIPHERAL VASCULAR DISEASE   HTN (hypertension)   CAD (coronary artery disease)   Atrial fibrillation   Warfarin anticoagulation    Right lower extremity cellulitis/question septic arthritis -it appears ortho is planning to take him to the OR tomorrow for washing of his right prosthesis. -I believe it is prudent to start antibiotics. 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 He does have stated allergies to vancomycin and amoxicillin. Upon discussion with both patient and his wife it appears that the vancomycin reaction was a small area of redness surrounding the infusion site(question red man syndrome) and they do not know what the reaction to amoxicillin was but the patient does not recall ever having anaphylactic-type symptoms. I will start him on vancomycin and Zosyn to be dosed by pharmacy. We will infuse these slowly and monitor for any allergic reactions.  Atrial fibrillation -rate controlled. -Chronically anticoagulated on Coumadin. -His INR is therapeutic. -Given his going to the OR tomorrow, I will elect to hold his Coumadin for now. We can start a heparin drip once his INR falls below therapeutic range and can restart Coumadin as soon as it is safe as per orthopedics.  AAA/coronary artery disease -stable. -Continue home medications.  Time Spent on Admission: 80 minutes  HERNANDEZ ACOSTA,ESTELA [facility] Pager: [phone] 07/07/2012, 8:44 PM

## 2012-07-07 NOTE — ED Notes (Signed)
Pt was sent here for rt knee swelling and hot to touch states that it is possible cellulitits.

## 2012-07-07 NOTE — Progress Notes (Signed)
ANTIBIOTIC CONSULT NOTE - INITIAL  Pharmacy Consult for Vancomycin & Zosyn Indication: Cellulitis  Allergies  Allergen Reactions  . Amoxicillin     REACTION: swelling and a rash  . Morphine Nausea And Vomiting  . Vancomycin     Pt c/o of feeling flushed and red, arm red around IV site    Patient Measurements: Height: 5\' 11"  (180.3 cm) Weight: 220 lb (99.791 kg) IBW/kg (Calculated) : 75.3  Vital Signs: Temp: 98.6 F (37 C) (04/29 2043) Temp src: Oral (04/29 2043) BP: 133/56 mmHg (04/29 2043) Pulse Rate: 86 (04/29 2043) Intake/Output from previous day:   Intake/Output from this shift:    Labs:  Recent Labs  07/07/12 1500  WBC 11.9*  HGB 11.4*  PLT 155  CREATININE 1.07   Estimated Creatinine Clearance: 74 ml/min (by C-G formula based on Cr of 1.07). No results found for this basename: VANCOTROUGH, VANCOPEAK, VANCORANDOM, GENTTROUGH, GENTPEAK, GENTRANDOM, TOBRATROUGH, TOBRAPEAK, TOBRARND, AMIKACINPEAK, AMIKACINTROU, AMIKACIN,  in the last 72 hours   Microbiology: No results found for this or any previous visit (from the past 720 hour(s)).  Medical History: Past Medical History  Diagnosis Date  . HTN (hypertension)   . Obesity   . History of cholecystectomy   . CAD (coronary artery disease)     Status post CABG followed by redo in 1994 / nuclear 2007, old infarct, no ischemia  . Ejection fraction < 50%     EF  45%...echo 2004 /  EF 45%, echo, August, 2012  . Hyperlipidemia   . Cigarette smoker     He is now smoking cigars and needs to quit.  . 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 no replacement until leg is stable  . Prostate cancer     History of prostate cancer with a seed implant and this is stable.  . Abdominal aortic aneurysm     is stable, being followed  . Atrial fibrillation     Rate control / going in and out of atrial fib, Coumadin  . Volume overload   . Right bundle branch block     noted November 09, 2008.  .  Warfarin anticoagulation     Atrial fibrillation  . Carotid artery disease     Doppler September 2 009, 40-50% LICA / Doppler October, 2010, 0-39% R. ICA 40-59% LICA  . Diabetes mellitus   . Myocardial infarction 1984  . Ulcer   . Peripheral vascular disease   . 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 Heart murmur   . Arthritis     Medications:  Scheduled:  . [COMPLETED] clindamycin (CLEOCIN) IV  600 mg Intravenous Once  . [START ON 07/08/2012] furosemide  40 mg Oral QAC supper  . gabapentin  100 mg Oral TID  . [COMPLETED]  HYDROmorphone (DILAUDID) injection  1 mg Intravenous Once  . [START ON 07/08/2012] insulin aspart  0-15 Units Subcutaneous TID WC  . [START ON 07/08/2012] insulin aspart  4 Units Subcutaneous TID WC  . magnesium gluconate  500 mg Oral Daily  . [START ON 07/08/2012] metolazone  2.5 mg Oral Daily  . potassium chloride SA  40 mEq Oral QHS  . simvastatin  40 mg Oral QHS  . sodium chloride  3 mL Intravenous Q12H  . spironolactone  25 mg Oral BID   Infusions:   Assessment:  72 year old male with c/o right lower extremity redness and swelling.  H/O bilateral knee replacements and subsequent left ATK amputation. MD notes that pt has documented allergy to vancomycin but "Upon discussion with both patient and his wife it appears that the vancomycin reaction was a small area of redness surrounding the infusion site(question red man syndrome)" Continue with plan to give vancomycin and zosyn (unknown allergic reaction to amoxicillin) and infuse slowly with monitoring   CrCl (n) = 62 ml/min  Pt received Clindamycin 600mg  IV @ 19:15  Goal of Therapy:  Vancomycin trough level 10-15 mcg/ml  Plan:  Measure antibiotic drug levels at steady state Follow up culture results Zosyn 3.375gm IV q8h (each dose infused over 4 hrs) Vancomycin 1000mg  IV q12h  Auren Valdes, Joselyn Glassman, PharmD 07/07/2012,8:57 PM

## 2012-07-07 NOTE — ED Provider Notes (Signed)
History     CSN: [id number]  Arrival date & time 07/07/12  1452   First MD Initiated Contact with Patient 07/07/12 1814      Chief Complaint  Patient presents with  . Knee Pain    (Consider location/radiation/quality/duration/timing/severity/associated sxs/prior treatment) The history is provided by the patient and medical records. No language interpreter was used.    Derek Frye is a 72 y.o. male  with a hx of HTN, NIDDM, PVD, L AKA, arthritis, total knee arthroplasty right leg (2007 revision by Charlann Boxer)  presents to the Emergency Department complaining of gradual, persistent, progressively worsening right knee swelling onset 48 hours ago with associated erythema, pain, worsening edema and skin changes to the lower leg.  Pt was seen by Viviann Spare, PA at Dr Nilsa Nutting office today and referred to the ED for further evaluation.  Pt c/o 7/10 pain in the leg. Wife reports chronic leg swelling and weeping with worsening in the last 2 days. Nothing makes it better or worse.  Pt denies fever, chills, headache, neck pain, chest pain, SOB, abdominal pain, nausea, vomiting, diarrhea, weakness, dizziness, syncope.  Dr. Charlann Boxer, plans on seeing him tomorrow, to tap the right knee, to rule out an acute knee infection.    Past Medical History  Diagnosis Date  . HTN (hypertension)   . Obesity   . History of cholecystectomy   . CAD (coronary artery disease)     Status post CABG followed by redo in 1994 / nuclear 2007, old infarct, no ischemia  . Ejection fraction < 50%     EF  45%...echo 2004 /  EF 45%, echo, August, 2012  . Hyperlipidemia   . Cigarette smoker     He is now smoking cigars and needs to quit.  . 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 no replacement until leg is stable  . Prostate cancer     History of prostate cancer with a seed implant and this is stable.  . Abdominal aortic aneurysm     is stable, being followed  . Atrial fibrillation     Rate control /  going in and out of atrial fib, Coumadin  . Volume overload   . Right bundle branch block     noted November 09, 2008.  . Warfarin anticoagulation     Atrial fibrillation  . Carotid artery disease     Doppler September 2 009, 40-50% LICA / Doppler October, 2010, 0-39% R. ICA 40-59% LICA  . Diabetes mellitus   . Myocardial infarction 1984  . Ulcer   . Peripheral vascular disease   . 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 Heart murmur   . Arthritis     Past Surgical History  Procedure Laterality Date  . Total knee arthroplasty    . Coronary artery bypass graft  1994    x2  . Anal fissure repair    . Pr vein bypass graft,aorto-fem-pop    . Leg amputation above knee  Oct. 23, 2012    Left Leg AKA  . Joint replacement      bil knee  . Abdominal aortic endovascular stent graft N/A 04/30/2012    Procedure:  Ultrasound guided,   inserstion of  ABDOMINAL AORTIC ENDOVASCULAR STENT GRAFT;  Surgeon: Nada Libman, MD;  Location: [facility] OR;  Service: Vascular;  Laterality: N/A;  EVAR- Gore    Family History  Problem Relation Age of  Onset  . Stroke Father 25  . Diabetes Other     History  Substance Use Topics  . Smoking status: Current Some Day Smoker -- 0.50 packs/day for 30 years    Types: Cigars, Cigarettes  . Smokeless tobacco: Never Used  . Alcohol Use: No      Review of Systems  Constitutional: Negative for fever, diaphoresis, appetite change, fatigue and unexpected weight change.  HENT: Negative for mouth sores and neck stiffness.   Eyes: Negative for visual disturbance.  Respiratory: Negative for cough, chest tightness, shortness of breath and wheezing.   Cardiovascular: Negative for chest pain.  Gastrointestinal: Negative for nausea, vomiting, abdominal pain, diarrhea and constipation.  Endocrine: Negative for polydipsia, polyphagia and polyuria.  Genitourinary: Negative for dysuria, urgency, frequency and hematuria.   Musculoskeletal: Positive for arthralgias. Negative for back pain.  Skin: Positive for color change and wound. Negative for rash.  Allergic/Immunologic: Negative for immunocompromised state.  Neurological: Negative for syncope, light-headedness and headaches.  Hematological: Does not bruise/bleed easily.  Psychiatric/Behavioral: Negative for sleep disturbance. The patient is not nervous/anxious.     Allergies  Amoxicillin; Morphine; and Vancomycin  Home Medications   Current Outpatient Rx  Name  Route  Sig  Dispense  Refill  . docusate sodium (COLACE) 100 MG capsule   Oral   Take 100-200 mg by mouth daily as needed. For constipation         . furosemide (LASIX) 40 MG tablet   Oral   Take 40 mg by mouth every evening. Take one (1) tablet by mouth daily.         Marland Kitchen gabapentin (NEURONTIN) 100 MG capsule   Oral   Take 100 mg by mouth 3 (three) times daily as needed. For phantom pain         . magnesium gluconate (MAGONATE) 500 MG tablet   Oral   Take 500 mg by mouth daily.          . metFORMIN (GLUCOPHAGE) 850 MG tablet   Oral   Take 850 mg by mouth 2 (two) times daily with a meal.          . metolazone (ZAROXOLYN) 2.5 MG tablet   Oral   Take 2.5 mg by mouth daily.          . potassium chloride SA (K-DUR,KLOR-CON) 20 MEQ tablet   Oral   Take 40 mEq by mouth every evening.          . simvastatin (ZOCOR) 40 MG tablet   Oral   Take 1 tablet (40 mg total) by mouth at bedtime.   90 tablet   3   . spironolactone (ALDACTONE) 25 MG tablet   Oral   Take 25 mg by mouth 2 (two) times daily. Take one (1) tablet by mouth two (2) times a day.         . warfarin (COUMADIN) 2.5 MG tablet   Oral   Take 1.25-2.5 mg by mouth every evening. Takes 1.25 mg on sun, tue, thu, fri and 2.5 mg all other days           BP 103/57  Pulse 79  Temp(Src) 98.7 F (37.1 C) (Oral)  SpO2 95%  Physical Exam  Nursing note and vitals reviewed. Constitutional: He is oriented  to person, place, and time. He appears well-developed and well-nourished. No distress.  HENT:  Head: Normocephalic and atraumatic.  Mouth/Throat: Oropharynx is clear and moist. No oropharyngeal exudate.  Eyes: Conjunctivae are normal. Pupils  are equal, round, and reactive to light. No scleral icterus.  Neck: Normal range of motion. Neck supple.  Cardiovascular: Normal rate, regular rhythm, normal heart sounds and intact distal pulses.   Pulmonary/Chest: Effort normal and breath sounds normal. No respiratory distress. He has no wheezes.  Abdominal: Soft. Bowel sounds are normal. He exhibits no mass. There is no tenderness. There is no rebound and no guarding.  Musculoskeletal: He exhibits edema (significant of the RLE) and tenderness.       Right knee: He exhibits decreased range of motion, swelling, effusion and erythema. He exhibits no ecchymosis, no deformity and no laceration.  Decreased ROM of the right knee, ankle 2/2 swelling and pain L AKA   Lymphadenopathy:    He has no cervical adenopathy.       Right: No inguinal, no supraclavicular and no epitrochlear adenopathy present.  No inguinal adenopathy  Neurological: He is alert and oriented to person, place, and time. He exhibits normal muscle tone. Coordination normal.  Speech is clear and goal oriented Moves extremities without ataxia  Skin: Skin is warm and dry. No rash noted. He is not diaphoretic. There is erythema.  Cellulitic and darkened skin of the RLE with weeping and areas of excoriation, no discrete abscess or area of fluctuance  Psychiatric: He has a normal mood and affect. His behavior is normal.    ED Course  Procedures (including critical care time)  Labs Reviewed  CBC WITH DIFFERENTIAL - Abnormal; Notable for the following:    WBC 11.9 (*)    RBC 3.88 (*)    Hemoglobin 11.4 (*)    HCT 34.1 (*)    RDW 16.0 (*)    Neutrophils Relative 78 (*)    Neutro Abs 9.3 (*)    Lymphocytes Relative 7 (*)    Monocytes  Relative 14 (*)    Monocytes Absolute 1.7 (*)    All other components within normal limits  COMPREHENSIVE METABOLIC PANEL - Abnormal; Notable for the following:    Sodium 134 (*)    Chloride 94 (*)    Glucose, Bld 151 (*)    BUN 27 (*)    Albumin 3.4 (*)    Total Bilirubin 1.3 (*)    GFR calc non Af Amer 67 (*)    GFR calc Af Amer 77 (*)    All other components within normal limits  URINALYSIS, ROUTINE W REFLEX MICROSCOPIC - Abnormal; Notable for the following:    Color, Urine AMBER (*)    Bilirubin Urine SMALL (*)    Protein, ur 30 (*)    All other components within normal limits  PROTIME-INR - Abnormal; Notable for the following:    Prothrombin Time 24.1 (*)    INR 2.28 (*)    All other components within normal limits  CULTURE, BLOOD (ROUTINE X 2)  CULTURE, BLOOD (ROUTINE X 2)  SEDIMENTATION RATE  URINE MICROSCOPIC-ADD ON  C-REACTIVE PROTEIN   No results found.   1. Cellulitis of right leg   2. Obesity   3. Warfarin anticoagulation   4. PERIPHERAL VASCULAR DISEASE       MDM  Benancio Deeds is from Dr. Nilsa Nutting office for workup and admission of right lower extremity cellulitis.  Patient elevated white blood cell count 11.9.  Patient with right lower leg cellulitis on exam without evidence of discrete abscess.  Plan is for patient to be admitted to [facility] hospitalist service and plan for Dr. Charlann Boxer to tap the knee to identify possible  infection of the arthroplasty.  Alert, oriented, nontoxic, nonseptic appearing, afebrile and not tachycardic. Patient with allergies to amoxicillin and vancomycin therefore we will begin clindamycin and consult ID for further recommendation.  Dr. Effie Shy was consulted, evaluated this patient with me and agrees with the plan.             Dahlia Client Caedmon Louque, PA-C 07/07/12 2036

## 2012-07-07 NOTE — ED Provider Notes (Signed)
Derek Frye is a 72 y.o. male with worsening swelling and redness in his right leg for several days. He saw his orthopedist today and was sent here to be admitted for cellulitis. Dr. Charlann Boxer, plans on seeing him tomorrow, to tap the right knee, to rule out an acute knee infection. The patient saw his vascular surgeon 2 months ago. He does not have arterial disease in the right leg. He denies fever, chills, nausea, vomiting, weakness, or dizziness. 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  Exam- obese, alert, cooperative. Right leg marked edema below the knee with redness and several areas of excoriation and liquid. His diffuse tenderness in the right lower leg. There are no areas of fluctuance to indicate a local abscess is a small right knee effusion, but no significant tenderness to palpation of the knee. There is no popliteal swelling or tenderness. There is no right groin adenopathy.  Assessment: Right lower leg cellulitis. Patient is to be treated with broad-spectrum antibiotics. He'll need admission for management, with close followup by orthopedics   Medical screening examination/treatment/procedure(s) were conducted as a shared visit with non-physician practitioner(s) and myself.  I personally evaluated the patient during the encounter  Flint Melter, MD 07/08/12 [phone]

## 2012-07-08 DIAGNOSIS — I714 Abdominal aortic aneurysm, without rupture: Secondary | ICD-10-CM

## 2012-07-08 DIAGNOSIS — I2510 Atherosclerotic heart disease of native coronary artery without angina pectoris: Secondary | ICD-10-CM

## 2012-07-08 LAB — GLUCOSE, CAPILLARY
Glucose-Capillary: 155 mg/dL — ABNORMAL HIGH (ref 70–99)
Glucose-Capillary: 183 mg/dL — ABNORMAL HIGH (ref 70–99)
Glucose-Capillary: 165 mg/dL — ABNORMAL HIGH (ref 70–99)

## 2012-07-08 LAB — CBC
Hemoglobin: 10.6 g/dL — ABNORMAL LOW (ref 13.0–17.0)
MCH: 28.2 pg (ref 26.0–34.0)
Platelets: 133 10*3/uL — ABNORMAL LOW (ref 150–400)
RBC: 3.76 MIL/uL — ABNORMAL LOW (ref 4.22–5.81)
WBC: 8.6 10*3/uL (ref 4.0–10.5)

## 2012-07-08 LAB — BASIC METABOLIC PANEL
CO2: 28 mEq/L (ref 19–32)
Calcium: 8.5 mg/dL (ref 8.4–10.5)
GFR calc non Af Amer: 63 mL/min — ABNORMAL LOW (ref >90)
Glucose, Bld: 170 mg/dL — ABNORMAL HIGH (ref 70–99)
Potassium: 3.9 mEq/L (ref 3.5–5.1)
Sodium: 134 mEq/L — ABNORMAL LOW (ref 135–145)

## 2012-07-08 LAB — HEMOGLOBIN A1C: Hgb A1c MFr Bld: 6.2 % — ABNORMAL HIGH (ref <5.7)

## 2012-07-08 LAB — C-REACTIVE PROTEIN: CRP: 16.8 mg/dL — ABNORMAL HIGH (ref <0.60)

## 2012-07-08 MED ORDER — WARFARIN - PHARMACIST DOSING INPATIENT: Freq: Every day | Status: DC

## 2012-07-08 MED ORDER — HYDROMORPHONE HCL PF 1 MG/ML IJ SOLN: 1.0000 mg | INTRAMUSCULAR | Status: DC | PRN

## 2012-07-08 MED ORDER — INSULIN ASPART 100 UNIT/ML ~~LOC~~ SOLN
5.0000 [IU] | Freq: Three times a day (TID) | SUBCUTANEOUS | Status: DC
  Administered 2012-07-08 – 2012-07-10 (×6): 5 [IU] via SUBCUTANEOUS

## 2012-07-08 MED ORDER — WARFARIN SODIUM 2 MG PO TABS
2.0000 mg | ORAL_TABLET | Freq: Once | ORAL | Status: AC
  Administered 2012-07-08: 2 mg via ORAL
  Filled 2012-07-08: qty 1

## 2012-07-08 NOTE — Progress Notes (Signed)
Patient ID: Derek Frye, male   DOB: 05/16/1940, 72 y.o.   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 He presented to the hospital today directly from Dr. Nilsa Nutting office. He went there for evaluation of right lower extremity edema and redness that he first noticed about 48 hours prior to this admission. It has gotten progressively worse and is now involving the right knee. We have been asked to admit him for further evaluation and management. Please note the patient denies fever, chills, other systemic concerns.   Principal Problem:   Right lower extremity cellulitis, questionable septic arthritis - Patient clinically improving, he was started on vancomycin and Zosyn, patient remains afebrile, white blood cell count trending down - Will continue current antibiotic regimen, no plan for OR today as we'll first try antibiotics and see how patient is responding - Please note the patient may need prolonged IV antibiotics and may require a PICC line prior to discharge - Continue supportive care with analgesia as needed, keep extremity elevated - Focus on sugar control Active Problems:   Diabetes mellitus type 2 - A1c 6.2 (07/07/2012), continue sliding scale insulin while inpatient  - Patient will be able to continue metformin upon discharge as his renal function is stable   Anemia of chronic disease - Hemoglobin slightly down from admission but overall stable, no current indication for transfusion - CBC in AM   DYSLIPIDEMIA - Stable medical issue, continue simvastatin   AAA -  Stable clinical issue   HTN (hypertension) - reasonable inpatient control - continue lasix and spironolactone    CAD (coronary artery disease) - stable medical issue    Atrial fibrillation - rate controlled, continue coumadin per pharmacy   Consultants:  Ortho  Procedures/Studies:  None  Antibiotics:  Vancomycin 04/29 -->  Zosyn 04/29 -->   Code Status: Full Family Communication: Pt at bedside Disposition Plan: Home when medically stable  HPI/Subjective: No events overnight.   Objective: Filed Vitals:   07/07/12 2000 07/07/12 2043 07/08/12 0449 07/08/12 0949  BP: 103/57 133/56 118/57 134/63  Pulse: 79 86 62 76  Temp: 98.7 F (37.1 C) 98.6 F (37 C) 98.2 F (36.8 C) 98.7 F (37.1 C)  TempSrc: Oral Oral Oral Oral  Resp:  18 16 17   Height:  5\' 11"  (1.803 m)    Weight:  99.791 kg (220 lb)    SpO2: 95% 96% 95% 93%    Intake/Output Summary (Last 24 hours) at 07/08/12 1243 Last data filed at 07/08/12 5784  Gross per 24 hour  Intake    480 ml  Output    251 ml  Net    229 ml    Exam:   General:  Pt is alert, follows commands appropriately, not in acute distress  Cardiovascular: Irregular rate and rhythm, no murmurs, no rubs, no gallops  Respiratory: Clear to auscultation bilaterally, no wheezing, no crackles, no rhonchi  Abdomen: Soft, non tender, non distended, bowel sounds present, no guarding  Extremities: Right lower extremity edema, warmth to touch, erythema, tenderness to palpation, consistent with cellulitis extending to the right knee, left AKA  Neuro: Grossly nonfocal  Data Reviewed: Basic Metabolic Panel:  Recent Labs Lab 07/07/12 1500 07/08/12 0345  NA 134* 134*  K 3.8 3.9  CL 94* 97  CO2 29 28  GLUCOSE 151* 170*  BUN 27* 33*  CREATININE 1.07 1.12  CALCIUM 9.4 8.5   Liver Function Tests:  Recent Labs Lab 07/07/12 1500  AST 19  ALT 13  ALKPHOS 114  BILITOT 1.3*  PROT 7.9  ALBUMIN 3.4*   CBC:  Recent Labs Lab  07/07/12 1500 07/08/12 0345  WBC 11.9* 8.6  NEUTROABS 9.3*  --   HGB 11.4* 10.6*  HCT 34.1* 32.1*  MCV 87.9 85.4  PLT 155 133*   CBG:  Recent Labs Lab 07/07/12 2142 07/08/12 0749 07/08/12 1130  GLUCAP 205* 155* 227*   Scheduled Meds: . furosemide  40 mg Oral QAC supper  . gabapentin  100 mg Oral TID  . insulin aspart  0-15 Units Subcutaneous TID WC  . insulin aspart  4 Units Subcutaneous TID WC  . magnesium gluconate  500 mg Oral Daily  . metolazone  2.5 mg Oral Daily  . ZOSYN  IV  3.375 g Intravenous Q8H  . potassium chloride SA  40 mEq Oral QHS  . simvastatin  40 mg Oral QHS  . spironolactone  25 mg Oral BID  . vancomycin  1,000 mg Intravenous Q12H   Continuous Infusions:   Debbora Presto, MD  [facility] Pager [phone]  If 7PM-7AM, please contact night-coverage [email or web address] Password [email or web address] 07/08/2012, 12:43 PM   LOS: 1 day

## 2012-07-08 NOTE — Care Management Note (Signed)
CARE MANAGEMENT NOTE 07/08/2012  Patient:  VIVAN, AGOSTINO   Account Number:  [id number]  Date Initiated:  07/08/2012  Documentation initiated by:  Deshannon Hinchliffe  Subjective/Objective Assessment:   72 yo male admitted with cellulitis. Per chart OR 07/08/12 for washing of his right prosthesis.     Action/Plan:   Home when stable.Pt may require PT eval upon surgical intervention.   Anticipated DC Date:     Anticipated DC Plan:        DC Planning Services  CM consult      Choice offered to / List presented to:             Status of service:  In process, will continue to follow Medicare Important Message given?   (If response is "NO", the following Medicare IM given date fields will be blank) Date Medicare IM given:   Date Additional Medicare IM given:    Discharge Disposition:    Per UR Regulation:    If discussed at Long Length of Stay Meetings, dates discussed:    Comments:  07/08/12 1140 Faylene Allerton,RN,BSN [phone] Scheduled surgical intervention 07/08/12. Pt from home PTA. Will continue to follow. Pt may require PT eval upon intervention.

## 2012-07-08 NOTE — Progress Notes (Signed)
  Pharmacy Note (Brief) - Warfarin Dosing  For full note, please see previous Pharmacist's note from this afternoon. INR = 2.48, remains in goal range and rose slightly despite missing 4/29 dose. Will use lower than normal home dose tonight because of this.  Warfarin 2mg  PO x1  Darrol Angel, PharmD Pager: [phone] 07/08/2012 5:43 PM

## 2012-07-08 NOTE — Progress Notes (Signed)
ANTICOAGULATION CONSULT NOTE - Initial Consult  Pharmacy Consult for Warfarin Indication: atrial fibrillation  Allergies  Allergen Reactions  . Amoxicillin     REACTION: swelling and a rash  . Morphine Nausea And Vomiting  . Vancomycin     Pt c/o of feeling flushed and red, arm red around IV site    Patient Measurements: Height: 5\' 11"  (180.3 cm) Weight: 220 lb (99.791 kg) IBW/kg (Calculated) : 75.3   Vital Signs: Temp: 98.7 F (37.1 C) (04/30 0949) Temp src: Oral (04/30 0949) BP: 134/63 mmHg (04/30 0949) Pulse Rate: 76 (04/30 0949)  Labs:  Recent Labs  07/07/12 1500 07/07/12 1503 07/08/12 0345  HGB 11.4*  --  10.6*  HCT 34.1*  --  32.1*  PLT 155  --  133*  LABPROT  --  24.1*  --   INR  --  2.28*  --   CREATININE 1.07  --  1.12    Estimated Creatinine Clearance: 70.7 ml/min (by C-G formula based on Cr of 1.12).   Medical History: Past Medical History  Diagnosis Date  . HTN (hypertension)   . Obesity   . History of cholecystectomy   . CAD (coronary artery disease)     Status post CABG followed by redo in 1994 / nuclear 2007, old infarct, no ischemia  . Ejection fraction < 50%     EF  45%...echo 2004 /  EF 45%, echo, August, 2012  . Hyperlipidemia   . Cigarette smoker     He is now smoking cigars and needs to quit.  . 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 no replacement until leg is stable  . Prostate cancer     History of prostate cancer with a seed implant and this is stable.  . Abdominal aortic aneurysm     is stable, being followed  . Atrial fibrillation     Rate control / going in and out of atrial fib, Coumadin  . Volume overload   . Right bundle branch block     noted November 09, 2008.  . Warfarin anticoagulation     Atrial fibrillation  . Carotid artery disease     Doppler September 2 009, 40-50% LICA / Doppler October, 2010, 0-39% R. ICA 40-59% LICA  . Diabetes mellitus   . Myocardial infarction 1984  .  Ulcer   . Peripheral vascular disease   . 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 Heart murmur   . Arthritis     Medications:  Prescriptions prior to admission  Medication Sig Dispense Refill  . docusate sodium (COLACE) 100 MG capsule Take 100-200 mg by mouth daily as needed. For constipation      . furosemide (LASIX) 40 MG tablet Take 40 mg by mouth every evening. Take one (1) tablet by mouth daily.      Marland Kitchen gabapentin (NEURONTIN) 100 MG capsule Take 100 mg by mouth 3 (three) times daily as needed. For phantom pain      . magnesium gluconate (MAGONATE) 500 MG tablet Take 500 mg by mouth daily.       . metFORMIN (GLUCOPHAGE) 850 MG tablet Take 850 mg by mouth 2 (two) times daily with a meal.       . metolazone (ZAROXOLYN) 2.5 MG tablet Take 2.5 mg by mouth daily.       . potassium chloride SA (K-DUR,KLOR-CON) 20 MEQ tablet Take 40 mEq by mouth every  evening.       . simvastatin (ZOCOR) 40 MG tablet Take 1 tablet (40 mg total) by mouth at bedtime.  90 tablet  3  . spironolactone (ALDACTONE) 25 MG tablet Take 25 mg by mouth 2 (two) times daily. Take one (1) tablet by mouth two (2) times a day.      . warfarin (COUMADIN) 2.5 MG tablet Take 1.25-2.5 mg by mouth every evening. Takes 1.25 mg on sun, tue, thu, fri and 2.5 mg all other days       Scheduled:  . [COMPLETED] clindamycin (CLEOCIN) IV  600 mg Intravenous Once  . furosemide  40 mg Oral QAC supper  . gabapentin  100 mg Oral TID  . [COMPLETED]  HYDROmorphone (DILAUDID) injection  1 mg Intravenous Once  . insulin aspart  0-15 Units Subcutaneous TID WC  . insulin aspart  5 Units Subcutaneous TID WC  . magnesium gluconate  500 mg Oral Daily  . metolazone  2.5 mg Oral Daily  . piperacillin-tazobactam (ZOSYN)  IV  3.375 g Intravenous Q8H  . potassium chloride SA  40 mEq Oral QHS  . simvastatin  40 mg Oral QHS  . sodium chloride  3 mL Intravenous Q12H  . spironolactone  25 mg Oral BID  . vancomycin   1,000 mg Intravenous Q12H  . [DISCONTINUED] insulin aspart  4 Units Subcutaneous TID WC   Infusions:   PRN: sodium chloride, acetaminophen, acetaminophen, HYDROmorphone (DILAUDID) injection, oxyCODONE, sodium chloride, [DISCONTINUED]  HYDROmorphone (DILAUDID) injection Anti-infectives   Start     Dose/Rate Route Frequency Ordered Stop   07/07/12 2200  vancomycin (VANCOCIN) IVPB 1000 mg/200 mL premix     1,000 mg 200 mL/hr over 60 Minutes Intravenous Every 12 hours 07/07/12 2110     07/07/12 2200  piperacillin-tazobactam (ZOSYN) IVPB 3.375 g     3.375 g 12.5 mL/hr over 240 Minutes Intravenous Every 8 hours 07/07/12 2110     07/07/12 1900  clindamycin (CLEOCIN) IVPB 600 mg     600 mg 100 mL/hr over 30 Minutes Intravenous  Once 07/07/12 1851 07/07/12 1945      Assessment:  72 yo M on chronic anticoagulation with warfarin for A.fib. Warfarin ws held on admission for OR on 4/30 for washing of R prosthesis.  OR plan cancelled for now, plan to try abx first.  Resume warfarin per pharmacy   Warfarin dose prior to admission = 1.25 mg on S/Tues/Thur/Friday and 2.5 mg on all other days; Last dose 4/28  INR on admission therapeutic 2.28, Will order INR for today and dose accordingly   Hgb 10.6, Plt 133.  No bleeding/complications reported  Goal of Therapy:  INR 2-3 Monitor platelets by anticoagulation protocol: Yes   Plan:  1.) STAT PT/INR still pending.  F/u with warfarin dose tonight based on INR today.  2.) Daily PT/INR   Clydene Fake PharmD Pager #: [phone] 1:07 PM 07/08/2012

## 2012-07-09 LAB — BASIC METABOLIC PANEL
CO2: 27 mEq/L (ref 19–32)
Calcium: 8.4 mg/dL (ref 8.4–10.5)
Potassium: 3.5 mEq/L (ref 3.5–5.1)
Sodium: 135 mEq/L (ref 135–145)

## 2012-07-09 LAB — CBC
MCH: 29.2 pg (ref 26.0–34.0)
Platelets: 124 10*3/uL — ABNORMAL LOW (ref 150–400)
RBC: 3.39 MIL/uL — ABNORMAL LOW (ref 4.22–5.81)
WBC: 7.1 10*3/uL (ref 4.0–10.5)

## 2012-07-09 LAB — GLUCOSE, CAPILLARY: Glucose-Capillary: 156 mg/dL — ABNORMAL HIGH (ref 70–99)

## 2012-07-09 MED ORDER — WARFARIN 1.25 MG HALF TABLET
1.2500 mg | ORAL_TABLET | Freq: Once | ORAL | Status: AC
  Administered 2012-07-09: 1.25 mg via ORAL
  Filled 2012-07-09: qty 1

## 2012-07-09 MED ORDER — LIDOCAINE HCL (PF) 1 % IJ SOLN: 30.0000 mL | Freq: Once | INTRAMUSCULAR | Status: DC

## 2012-07-09 MED ORDER — HYDROMORPHONE HCL PF 1 MG/ML IJ SOLN
0.5000 mg | INTRAMUSCULAR | Status: DC | PRN
  Administered 2012-07-09: 0.5 mg via INTRAVENOUS
  Filled 2012-07-09: qty 1

## 2012-07-09 MED ORDER — SODIUM CHLORIDE 0.9 % IV SOLN
INTRAVENOUS | Status: AC
  Administered 2012-07-09: 15:00:00 via INTRAVENOUS
  Administered 2012-07-09: 500 mL via INTRAVENOUS

## 2012-07-09 MED ORDER — LIDOCAINE HCL (PF) 1 % IJ SOLN
30.0000 mL | Freq: Once | INTRAMUSCULAR | Status: AC
  Administered 2012-07-10: 30 mL via INTRADERMAL
  Filled 2012-07-09: qty 30

## 2012-07-09 MED ORDER — INSULIN ASPART 100 UNIT/ML ~~LOC~~ SOLN
0.0000 [IU] | Freq: Every day | SUBCUTANEOUS | Status: DC
  Administered 2012-07-09 – 2012-07-12 (×2): 2 [IU] via SUBCUTANEOUS

## 2012-07-09 MED ORDER — VANCOMYCIN HCL IN DEXTROSE 750-5 MG/150ML-% IV SOLN
750.0000 mg | Freq: Two times a day (BID) | INTRAVENOUS | Status: DC
  Administered 2012-07-09 – 2012-07-10 (×3): 750 mg via INTRAVENOUS
  Filled 2012-07-09 (×3): qty 150

## 2012-07-09 MED ORDER — INSULIN ASPART 100 UNIT/ML ~~LOC~~ SOLN
0.0000 [IU] | Freq: Three times a day (TID) | SUBCUTANEOUS | Status: DC
  Administered 2012-07-10: 2 [IU] via SUBCUTANEOUS
  Administered 2012-07-10: 5 [IU] via SUBCUTANEOUS
  Administered 2012-07-10 – 2012-07-11 (×3): 3 [IU] via SUBCUTANEOUS
  Administered 2012-07-11: 2 [IU] via SUBCUTANEOUS
  Administered 2012-07-12 (×2): 3 [IU] via SUBCUTANEOUS
  Administered 2012-07-13: 2 [IU] via SUBCUTANEOUS
  Administered 2012-07-14 (×2): 3 [IU] via SUBCUTANEOUS
  Administered 2012-07-14: 2 [IU] via SUBCUTANEOUS
  Administered 2012-07-15 (×2): 3 [IU] via SUBCUTANEOUS
  Administered 2012-07-15: 2 [IU] via SUBCUTANEOUS
  Administered 2012-07-16: 3 [IU] via SUBCUTANEOUS
  Administered 2012-07-16: 2 [IU] via SUBCUTANEOUS
  Administered 2012-07-17: 3 [IU] via SUBCUTANEOUS
  Administered 2012-07-17: 2 [IU] via SUBCUTANEOUS

## 2012-07-09 NOTE — Progress Notes (Signed)
ANTIBIOTIC CONSULT NOTE - INITIAL  Pharmacy Consult for Vancomycin & Zosyn Indication: Cellulitis  Allergies  Allergen Reactions  . Amoxicillin     REACTION: swelling and a rash  . Morphine Nausea And Vomiting  . Vancomycin     Pt c/o of feeling flushed and red, arm red around IV site    Patient Measurements: Height: 5\' 11"  (180.3 cm) Weight: 220 lb (99.791 kg) IBW/kg (Calculated) : 75.3  Vital Signs: Temp: 98.1 F (36.7 C) (05/01 0544) Temp src: Oral (05/01 0544) BP: 107/55 mmHg (05/01 0544) Pulse Rate: 76 (05/01 0544) Intake/Output from previous day: 04/30 0701 - 05/01 0700 In: 2094 [P.O.:1240; IV Piggyback:250] Out: 876 [Urine:875; Stool:1] Intake/Output from this shift: Total I/O In: 360 [P.O.:360] Out: 151 [Urine:150; Stool:1]  Labs:  Recent Labs  07/07/12 1500 07/08/12 0345 07/09/12 0400  WBC 11.9* 8.6 7.1  HGB 11.4* 10.6* 9.9*  PLT 155 133* 124*  CREATININE 1.07 1.12 1.40*   Estimated Creatinine Clearance: 56.6 ml/min (by C-G formula based on Cr of 1.4).  Recent Labs  07/09/12 1015  VANCOTROUGH 15.2     Microbiology: Recent Results (from the past 720 hour(s))  CULTURE, BLOOD (ROUTINE X 2)     Status: None   Collection Time    07/07/12  6:50 PM      Result Value Range Status   Specimen Description BLOOD RIGHT ARM   Final   Special Requests BOTTLES DRAWN AEROBIC AND ANAEROBIC 10CC   Final   Culture  Setup Time 07/08/2012 03:19   Final   Culture     Final   Value:        BLOOD CULTURE RECEIVED NO GROWTH TO DATE CULTURE WILL BE HELD FOR 5 DAYS BEFORE ISSUING A FINAL NEGATIVE REPORT   Report Status PENDING   Incomplete    Medical History: Past Medical History  Diagnosis Date  . HTN (hypertension)   . Obesity   . History of cholecystectomy   . CAD (coronary artery disease)     Status post CABG followed by redo in 1994 / nuclear 2007, old infarct, no ischemia  . Ejection fraction < 50%     EF  45%...echo 2004 /  EF 45%, echo, August, 2012   . Hyperlipidemia   . Cigarette smoker     He is now smoking cigars and needs to quit.  . 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 no replacement until leg is stable  . Prostate cancer     History of prostate cancer with a seed implant and this is stable.  . Abdominal aortic aneurysm     is stable, being followed  . Atrial fibrillation     Rate control / going in and out of atrial fib, Coumadin  . Volume overload   . Right bundle branch block     noted November 09, 2008.  . Warfarin anticoagulation     Atrial fibrillation  . Carotid artery disease     Doppler September 2 009, 40-50% LICA / Doppler October, 2010, 0-39% R. ICA 40-59% LICA  . Diabetes mellitus   . Myocardial infarction 1984  . Ulcer   . Peripheral vascular disease   . 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 Heart murmur   . Arthritis     Medications:  Scheduled:  . furosemide  40 mg Oral QAC supper  . gabapentin  100 mg Oral TID  .  insulin aspart  0-15 Units Subcutaneous TID WC  . insulin aspart  5 Units Subcutaneous TID WC  . magnesium gluconate  500 mg Oral Daily  . metolazone  2.5 mg Oral Daily  . piperacillin-tazobactam (ZOSYN)  IV  3.375 g Intravenous Q8H  . potassium chloride SA  40 mEq Oral QHS  . simvastatin  40 mg Oral QHS  . sodium chloride  3 mL Intravenous Q12H  . spironolactone  25 mg Oral BID  . vancomycin  1,000 mg Intravenous Q12H  . warfarin  1.25 mg Oral ONCE-1800  . [COMPLETED] warfarin  2 mg Oral Once  . Warfarin - Pharmacist Dosing Inpatient   Does not apply q1800  . [DISCONTINUED] insulin aspart  4 Units Subcutaneous TID WC   Infusions:   Assessment:  72 yo M on D2 Vancomycin 1 gram IV q12h/Zosyn EI for RLE cellulitis.  Hx of Bilateral Knee replacements and subsequent left ATK amputation.  SCr has increased today to 1.4 with CrCl 56 ml/min.  Obtained vancomycin trough (15.2) this morning and is at the upper end of  therapeutic goal.  Will empirically decrease vancomycin dose.     MD notes that pt has documented allergy to vancomycin but "Upon discussion with both patient and his wife it appears that the vancomycin reaction was a small area of redness surrounding the infusion site(question red man syndrome)" Continue with plan to give vancomycin and zosyn (unknown allergic reaction to amoxicillin) and infuse slowly with monitoring    Goal of Therapy:  Vancomycin trough level 10-15 mcg/ml  Plan:  1.) Decrease Vancomycin to 750 mg IV q12h, repeat VT at Css or as needed based on renal function  2.)  Zosyn 3.375gm IV q8h (each dose infused over 4 hrs) 3.) Repeat BMET in AM   Marquel Spoto, Loma Messing PharmD Pager #: [phone] 11:12 AM 07/09/2012

## 2012-07-09 NOTE — Progress Notes (Signed)
   Subjective: Right leg cellulitis and right knee pain.  Patient reports pain as mild, controlled. No events throughout the night. Does state that the knee feels some better, but still having some pain.  Expressed concern to Dr. Charlann Boxer about the right knee and the potential for loosing the replacement and/or amputation as happened with the other leg.  Objective:   VITALS:   Filed Vitals:   07/09/12 1322  BP: 118/47  Pulse: 87  Temp: 98.4 F (36.9 C)  Resp: 18   Cellulitis of the right lower leg which is retracting away from the knee.    LABS  Recent Labs  07/07/12 1500 07/08/12 0345 07/09/12 0400  HGB 11.4* 10.6* 9.9*  HCT 34.1* 32.1* 29.7*  WBC 11.9* 8.6 7.1  PLT 155 133* 124*     Recent Labs  07/07/12 1500 07/08/12 0345 07/09/12 0400  NA 134* 134* 135  K 3.8 3.9 3.5  BUN 27* 33* 38*  CREATININE 1.07 1.12 1.40*  GLUCOSE 151* 170* 167*     Assessment/Plan: Right leg cellulitis and right knee pain.  Plan is to aspirate the knee later today to evaluate for any infection within the knee. If positive for infection we may have to discuss further on treatments modalities. If infection is present it has been discussed that he may need procedure to washout of remove the prosthesis.     Anastasio Auerbach Jeri Jeanbaptiste   PAC  07/09/2012, 2:04 PM

## 2012-07-09 NOTE — Progress Notes (Signed)
Patient ID: Derek Frye, male   DOB: [birth year], 72 y.o.   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 He presented to the hospital today directly from Dr. Nilsa Frye office. He went there for evaluation of right lower extremity edema and redness that he first noticed about 48 hours prior to this admission. It has gotten progressively worse and is now involving the right knee. We have been asked to admit him for further evaluation and management. Please note the patient denies fever, chills, other systemic concerns.   Principal Problem:  Right lower extremity cellulitis, questionable septic arthritis  - Patient clinically improving, he was started on vancomycin and Zosyn, patient remains afebrile, white blood cell count trending down and within normal limits this am - Will continue current antibiotic regimen, will follow up on orrho recommendation regarding the ? Knee joint aspiration  - Continue supportive care with analgesia as needed, keep extremity elevated  Active Problems:  Acute renal failure - will continue IVF, stop lasix, repeat BMP in AM Diabetes mellitus type 2  - A1c 6.2 (07/07/2012), continue sliding scale insulin while inpatient, good control while inpatient   - Patient will be able to continue metformin upon discharge as his renal function is stable  Anemia of chronic disease  - Hemoglobin slightly down from admission but overall stable, no current indication for transfusion  - CBC in AM  DYSLIPIDEMIA  - Stable medical issue, continue simvastatin  AAA  - Stable  clinical issue  HTN (hypertension)  - reasonable inpatient control  - continue spironolactone  CAD (coronary artery disease)  - stable medical issue  Atrial fibrillation  - rate controlled, continue coumadin per pharmacy   Consultants:  Ortho Procedures/Studies:  None Antibiotics:  Vancomycin 04/29 -->  Zosyn 04/29 -->   Code Status: Full  Family Communication: Pt at bedside  Disposition Plan: Not ready for discharge   HPI/Subjective: No events overnight.   Objective: Filed Vitals:   07/08/12 1449 07/08/12 1955 07/08/12 2138 07/09/12 0544  BP: 110/60  124/49 107/55  Pulse: 64 60 61 76  Temp: 98 F (36.7 C)  100.6 F (38.1 C) 98.1 F (36.7 C)  TempSrc: Oral  Oral Oral  Resp: 16  18 16   Height:      Weight:      SpO2: 95%  98% 94%    Intake/Output Summary (Last 24 hours) at 07/09/12 0748 Last data filed at 07/09/12 5784  Gross per 24 hour  Intake   2094 ml  Output    876 ml  Net   1218 ml    Exam:   General:  Pt is alert, follows commands appropriately, not in acute distress  Cardiovascular: Regular rate and rhythm, S1/S2, no murmurs, no rubs, no gallops  Respiratory: Clear to auscultation bilaterally, no wheezing, no crackles, no rhonchi  Abdomen: Soft, non tender, non distended, bowel sounds present, no guarding  Extremities: Right lower extremity edema and erythema, tenderness to palpation, warmth to touch, extending to the knee area  Neuro: Grossly nonfocal  Data Reviewed: Basic Metabolic Panel:  Recent Labs Lab 07/07/12 1500 07/08/12 0345 07/09/12 0400  NA 134* 134* 135  K 3.8  3.9 3.5  CL 94* 97 98  CO2 29 28 27   GLUCOSE 151* 170* 167*  BUN 27* 33* 38*  CREATININE 1.07 1.12 1.40*  CALCIUM 9.4 8.5 8.4   Liver Function Tests:  Recent Labs Lab 07/07/12 1500  AST 19  ALT 13  ALKPHOS 114  BILITOT 1.3*  PROT 7.9  ALBUMIN 3.4*   CBC:  Recent Labs Lab 07/07/12 1500 07/08/12 0345 07/09/12 0400  WBC 11.9* 8.6 7.1  NEUTROABS  9.3*  --   --   HGB 11.4* 10.6* 9.9*  HCT 34.1* 32.1* 29.7*  MCV 87.9 85.4 87.6  PLT 155 133* 124*   CBG:  Recent Labs Lab 07/08/12 0749 07/08/12 1130 07/08/12 1647 07/08/12 2135 07/09/12 0727  GLUCAP 155* 227* 183* 165* 156*     Scheduled Meds: . furosemide  40 mg Oral QAC supper  . gabapentin  100 mg Oral TID  . insulin aspart  0-15 Units Subcutaneous TID WC  . insulin aspart  5 Units Subcutaneous TID WC  . magnesium gluconate  500 mg Oral Daily  . metolazone  2.5 mg Oral Daily  . piperacillin-tazobactam (ZOSYN)  IV  3.375 g Intravenous Q8H  . potassium chloride SA  40 mEq Oral QHS  . simvastatin  40 mg Oral QHS  . sodium chloride  3 mL Intravenous Q12H  . spironolactone  25 mg Oral BID  . vancomycin  1,000 mg Intravenous Q12H  . Warfarin - Pharmacist Dosing Inpatient   Does not apply q1800   Continuous Infusions:   Debbora Presto, MD  [facility] Pager [phone]  If 7PM-7AM, please contact night-coverage [email or web address] Password [email or web address] 07/09/2012, 7:48 AM   LOS: 2 days

## 2012-07-09 NOTE — Consult Note (Signed)
WOC consult Note Reason for Consult:partial thickness open wound between right great toe and 1st digit on right foot. MASD (moisture associated skin damage) Wound type:MASD complicated by venous insufficiency Pressure Ulcer POA: No Measurement: 2.5cm x 1.5cm x 0.2cm linear break in skin Wound ZOX:WRUEA, pink Drainage (amount, consistency, odor) noine at this time; patient reports serous exudate over the past week or so. Periwound:intact, red, warm Dressing procedure/placement/frequency:Patient known to me from previous admissions concerning his LLE, which has since been amputated.  Wife has been performing topical wound care to the affected area twice daily with "witch hazel", an astringent.  I will recommend conservative topical care (NS wet to dry dressings twice daily) as orthopedics is following for the right knee and there is resolving cellulitis.   I will not follow, but will remain available to this patient as well as his medical/surgical and nursing teams.  Please re-consult if needed. Thanks, Ladona Mow, MSN, RN, [facility], CWOCN [phone])

## 2012-07-09 NOTE — Progress Notes (Signed)
ANTICOAGULATION CONSULT NOTE - Initial Consult  Pharmacy Consult for Warfarin Indication: atrial fibrillation  Allergies  Allergen Reactions  . Amoxicillin     REACTION: swelling and a rash  . Morphine Nausea And Vomiting  . Vancomycin     Pt c/o of feeling flushed and red, arm red around IV site    Patient Measurements: Height: 5\' 11"  (180.3 cm) Weight: 220 lb (99.791 kg) IBW/kg (Calculated) : 75.3   Vital Signs: Temp: 98.1 F (36.7 C) (05/01 0544) Temp src: Oral (05/01 0544) BP: 107/55 mmHg (05/01 0544) Pulse Rate: 76 (05/01 0544)  Labs:  Recent Labs  07/07/12 1500 07/07/12 1503 07/08/12 0345 07/08/12 1426 07/09/12 0400  HGB 11.4*  --  10.6*  --  9.9*  HCT 34.1*  --  32.1*  --  29.7*  PLT 155  --  133*  --  124*  LABPROT  --  24.1*  --  25.7* 26.7*  INR  --  2.28*  --  2.48* 2.62*  CREATININE 1.07  --  1.12  --  1.40*    Estimated Creatinine Clearance: 56.6 ml/min (by C-G formula based on Cr of 1.4).   Medical History: Past Medical History  Diagnosis Date  . HTN (hypertension)   . Obesity   . History of cholecystectomy   . CAD (coronary artery disease)     Status post CABG followed by redo in 1994 / nuclear 2007, old infarct, no ischemia  . Ejection fraction < 50%     EF  45%...echo 2004 /  EF 45%, echo, August, 2012  . Hyperlipidemia   . Cigarette smoker     He is now smoking cigars and needs to quit.  . 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 no replacement until leg is stable  . Prostate cancer     History of prostate cancer with a seed implant and this is stable.  . Abdominal aortic aneurysm     is stable, being followed  . Atrial fibrillation     Rate control / going in and out of atrial fib, Coumadin  . Volume overload   . Right bundle branch block     noted November 09, 2008.  . Warfarin anticoagulation     Atrial fibrillation  . Carotid artery disease     Doppler September 2 009, 40-50% LICA / Doppler  October, 2010, 0-39% R. ICA 40-59% LICA  . Diabetes mellitus   . Myocardial infarction 1984  . Ulcer   . Peripheral vascular disease   . 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 Heart murmur   . Arthritis     Medications:  Prescriptions prior to admission  Medication Sig Dispense Refill  . docusate sodium (COLACE) 100 MG capsule Take 100-200 mg by mouth daily as needed. For constipation      . furosemide (LASIX) 40 MG tablet Take 40 mg by mouth every evening. Take one (1) tablet by mouth daily.      Marland Kitchen gabapentin (NEURONTIN) 100 MG capsule Take 100 mg by mouth 3 (three) times daily as needed. For phantom pain      . magnesium gluconate (MAGONATE) 500 MG tablet Take 500 mg by mouth daily.       . metFORMIN (GLUCOPHAGE) 850 MG tablet Take 850 mg by mouth 2 (two) times daily with a meal.       . metolazone (ZAROXOLYN) 2.5 MG tablet Take 2.5  mg by mouth daily.       . potassium chloride SA (K-DUR,KLOR-CON) 20 MEQ tablet Take 40 mEq by mouth every evening.       . simvastatin (ZOCOR) 40 MG tablet Take 1 tablet (40 mg total) by mouth at bedtime.  90 tablet  3  . spironolactone (ALDACTONE) 25 MG tablet Take 25 mg by mouth 2 (two) times daily. Take one (1) tablet by mouth two (2) times a day.      . warfarin (COUMADIN) 2.5 MG tablet Take 1.25-2.5 mg by mouth every evening. Takes 1.25 mg on sun, tue, thu, fri and 2.5 mg all other days       Scheduled:  . furosemide  40 mg Oral QAC supper  . gabapentin  100 mg Oral TID  . insulin aspart  0-15 Units Subcutaneous TID WC  . insulin aspart  5 Units Subcutaneous TID WC  . magnesium gluconate  500 mg Oral Daily  . metolazone  2.5 mg Oral Daily  . piperacillin-tazobactam (ZOSYN)  IV  3.375 g Intravenous Q8H  . potassium chloride SA  40 mEq Oral QHS  . simvastatin  40 mg Oral QHS  . sodium chloride  3 mL Intravenous Q12H  . spironolactone  25 mg Oral BID  . vancomycin  1,000 mg Intravenous Q12H  . [COMPLETED]  warfarin  2 mg Oral Once  . Warfarin - Pharmacist Dosing Inpatient   Does not apply q1800  . [DISCONTINUED] insulin aspart  4 Units Subcutaneous TID WC   Infusions:   PRN: sodium chloride, acetaminophen, acetaminophen, HYDROmorphone (DILAUDID) injection, oxyCODONE, sodium chloride, [DISCONTINUED]  HYDROmorphone (DILAUDID) injection, [DISCONTINUED]  HYDROmorphone (DILAUDID) injection Anti-infectives   Start     Dose/Rate Route Frequency Ordered Stop   07/07/12 2200  vancomycin (VANCOCIN) IVPB 1000 mg/200 mL premix     1,000 mg 200 mL/hr over 60 Minutes Intravenous Every 12 hours 07/07/12 2110     07/07/12 2200  piperacillin-tazobactam (ZOSYN) IVPB 3.375 g     3.375 g 12.5 mL/hr over 240 Minutes Intravenous Every 8 hours 07/07/12 2110     07/07/12 1900  clindamycin (CLEOCIN) IVPB 600 mg     600 mg 100 mL/hr over 30 Minutes Intravenous  Once 07/07/12 1851 07/07/12 1945      Assessment:  72 yo M on chronic anticoagulation with warfarin for A.fib. Warfarin ws held on admission for OR on 4/30 for washing of R prosthesis.  OR plan cancelled for now, plan to try abx first.  Resume warfarin per pharmacy   Warfarin dose prior to admission = 1.25 mg on S/Tues/Thur/Friday and 2.5 mg on all other days; Last dose 4/28  INR therapeutic and increasing today.  Dose held 4/29 in anticipation of procedure, INR currently does not reflect missed dose.    CBC okay,  No bleeding/complications reported  Goal of Therapy:  INR 2-3 Monitor platelets by anticoagulation protocol: Yes   Plan:  1.) Warfarin 1.25 mg po x 1 tonight at 1800 2.) Daily PT/INR   Alorah Mcree, Loma Messing PharmD Pager #: [phone] 10:21 AM 07/09/2012

## 2012-07-10 LAB — CBC
HCT: 28.1 % — ABNORMAL LOW (ref 39.0–52.0)
MCV: 83.4 fL (ref 78.0–100.0)
RBC: 3.37 MIL/uL — ABNORMAL LOW (ref 4.22–5.81)
WBC: 8.3 10*3/uL (ref 4.0–10.5)

## 2012-07-10 LAB — SYNOVIAL CELL COUNT + DIFF, W/ CRYSTALS
Crystals, Fluid: NONE SEEN
Lymphocytes-Synovial Fld: 9 % (ref 0–20)
Neutrophil, Synovial: 71 % — ABNORMAL HIGH (ref 0–25)
Other Cells-SYN: 0

## 2012-07-10 LAB — BASIC METABOLIC PANEL
CO2: 25 mEq/L (ref 19–32)
Chloride: 98 mEq/L (ref 96–112)
Creatinine, Ser: 1.11 mg/dL (ref 0.50–1.35)
Sodium: 132 mEq/L — ABNORMAL LOW (ref 135–145)

## 2012-07-10 LAB — PATHOLOGIST SMEAR REVIEW

## 2012-07-10 LAB — GLUCOSE, CAPILLARY: Glucose-Capillary: 165 mg/dL — ABNORMAL HIGH (ref 70–99)

## 2012-07-10 LAB — PROTIME-INR: INR: 2.2 — ABNORMAL HIGH (ref 0.00–1.49)

## 2012-07-10 MED ORDER — INSULIN ASPART 100 UNIT/ML ~~LOC~~ SOLN
7.0000 [IU] | Freq: Three times a day (TID) | SUBCUTANEOUS | Status: DC
  Administered 2012-07-11 – 2012-07-17 (×16): 7 [IU] via SUBCUTANEOUS

## 2012-07-10 MED ORDER — FUROSEMIDE 40 MG PO TABS
40.0000 mg | ORAL_TABLET | Freq: Every day | ORAL | Status: DC
  Administered 2012-07-11 – 2012-07-15 (×4): 40 mg via ORAL
  Filled 2012-07-10 (×9): qty 1

## 2012-07-10 MED ORDER — ONDANSETRON HCL 4 MG/2ML IJ SOLN
4.0000 mg | Freq: Four times a day (QID) | INTRAMUSCULAR | Status: DC | PRN
  Administered 2012-07-13 – 2012-07-16 (×2): 4 mg via INTRAVENOUS
  Filled 2012-07-10 (×2): qty 2

## 2012-07-10 MED ORDER — VANCOMYCIN HCL 10 G IV SOLR
1250.0000 mg | Freq: Two times a day (BID) | INTRAVENOUS | Status: DC
  Administered 2012-07-10 – 2012-07-13 (×6): 1250 mg via INTRAVENOUS
  Filled 2012-07-10 (×6): qty 1250

## 2012-07-10 MED ORDER — WARFARIN 1.25 MG HALF TABLET
1.2500 mg | ORAL_TABLET | Freq: Once | ORAL | Status: DC
  Filled 2012-07-10: qty 1

## 2012-07-10 MED ORDER — VITAMIN K1 10 MG/ML IJ SOLN
5.0000 mg | Freq: Once | INTRAVENOUS | Status: AC
  Administered 2012-07-10: 5 mg via INTRAVENOUS
  Filled 2012-07-10: qty 0.5

## 2012-07-10 NOTE — Progress Notes (Signed)
Pharmacy: Warfarin  Noted orders to HOLD warfarin for planned OR procedure for infected prosthetic knee.  Pharmacy will follow peripherally, Please re-consult pharmacy when warfarin is to resume.   Thanks,   Chung Chagoya, Loma Messing PharmD Pager #: [phone] 12:55 PM 07/10/2012

## 2012-07-10 NOTE — Progress Notes (Signed)
cbg 206 at hs, paged np. Order obtained to start hs SSI coverage.

## 2012-07-10 NOTE — Progress Notes (Signed)
ANTICOAGULATION CONSULT NOTE - Initial Consult  Pharmacy Consult for Warfarin Indication: atrial fibrillation  Allergies  Allergen Reactions  . Amoxicillin     REACTION: swelling and a rash  . Morphine Nausea And Vomiting  . Vancomycin     Pt c/o of feeling flushed and red, arm red around IV site    Patient Measurements: Height: 5\' 11"  (180.3 cm) Weight: 220 lb (99.791 kg) IBW/kg (Calculated) : 75.3   Vital Signs: Temp: 98.3 F (36.8 C) (05/02 0649) Temp src: Oral (05/02 0649) BP: 107/51 mmHg (05/02 0649) Pulse Rate: 62 (05/02 0649)  Labs:  Recent Labs  07/08/12 0345 07/08/12 1426 07/09/12 0400 07/10/12 0355  HGB 10.6*  --  9.9* 9.5*  HCT 32.1*  --  29.7* 28.1*  PLT 133*  --  124* 137*  LABPROT  --  25.7* 26.7* 23.5*  INR  --  2.48* 2.62* 2.20*  CREATININE 1.12  --  1.40* 1.11    Estimated Creatinine Clearance: 71.3 ml/min (by C-G formula based on Cr of 1.11).   Medical History: Past Medical History  Diagnosis Date  . HTN (hypertension)   . Obesity   . History of cholecystectomy   . CAD (coronary artery disease)     Status post CABG followed by redo in 1994 / nuclear 2007, old infarct, no ischemia  . Ejection fraction < 50%     EF  45%...echo 2004 /  EF 45%, echo, August, 2012  . Hyperlipidemia   . Cigarette smoker     He is now smoking cigars and needs to quit.  . 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 no replacement until leg is stable  . Prostate cancer     History of prostate cancer with a seed implant and this is stable.  . Abdominal aortic aneurysm     is stable, being followed  . Atrial fibrillation     Rate control / going in and out of atrial fib, Coumadin  . Volume overload   . Right bundle branch block     noted November 09, 2008.  . Warfarin anticoagulation     Atrial fibrillation  . Carotid artery disease     Doppler September 2 009, 40-50% LICA / Doppler October, 2010, 0-39% R. ICA 40-59% LICA  .  Diabetes mellitus   . Myocardial infarction 1984  . Ulcer   . Peripheral vascular disease   . 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 Heart murmur   . Arthritis     Medications:  Prescriptions prior to admission  Medication Sig Dispense Refill  . docusate sodium (COLACE) 100 MG capsule Take 100-200 mg by mouth daily as needed. For constipation      . furosemide (LASIX) 40 MG tablet Take 40 mg by mouth every evening. Take one (1) tablet by mouth daily.      Marland Kitchen gabapentin (NEURONTIN) 100 MG capsule Take 100 mg by mouth 3 (three) times daily as needed. For phantom pain      . magnesium gluconate (MAGONATE) 500 MG tablet Take 500 mg by mouth daily.       . metFORMIN (GLUCOPHAGE) 850 MG tablet Take 850 mg by mouth 2 (two) times daily with a meal.       . metolazone (ZAROXOLYN) 2.5 MG tablet Take 2.5 mg by mouth daily.       . potassium chloride SA (K-DUR,KLOR-CON) 20 MEQ tablet Take 40  mEq by mouth every evening.       . simvastatin (ZOCOR) 40 MG tablet Take 1 tablet (40 mg total) by mouth at bedtime.  90 tablet  3  . spironolactone (ALDACTONE) 25 MG tablet Take 25 mg by mouth 2 (two) times daily. Take one (1) tablet by mouth two (2) times a day.      . warfarin (COUMADIN) 2.5 MG tablet Take 1.25-2.5 mg by mouth every evening. Takes 1.25 mg on sun, tue, thu, fri and 2.5 mg all other days       Scheduled:  . gabapentin  100 mg Oral TID  . insulin aspart  0-15 Units Subcutaneous TID WC  . insulin aspart  0-5 Units Subcutaneous QHS  . insulin aspart  5 Units Subcutaneous TID WC  . lidocaine (PF)  30 mL Intradermal Once  . magnesium gluconate  500 mg Oral Daily  . metolazone  2.5 mg Oral Daily  . piperacillin-tazobactam (ZOSYN)  IV  3.375 g Intravenous Q8H  . potassium chloride SA  40 mEq Oral QHS  . simvastatin  40 mg Oral QHS  . sodium chloride  3 mL Intravenous Q12H  . spironolactone  25 mg Oral BID  . vancomycin  750 mg Intravenous Q12H  .  [COMPLETED] warfarin  1.25 mg Oral ONCE-1800  . Warfarin - Pharmacist Dosing Inpatient   Does not apply q1800  . [DISCONTINUED] furosemide  40 mg Oral QAC supper  . [DISCONTINUED] insulin aspart  0-15 Units Subcutaneous TID WC   Infusions:  . [EXPIRED] sodium chloride 10 mL/hr at 07/10/12 0225   PRN: sodium chloride, acetaminophen, acetaminophen, HYDROmorphone (DILAUDID) injection, oxyCODONE, sodium chloride Anti-infectives   Start     Dose/Rate Route Frequency Ordered Stop   07/09/12 1300  vancomycin (VANCOCIN) IVPB 750 mg/150 ml premix     750 mg 150 mL/hr over 60 Minutes Intravenous Every 12 hours 07/09/12 1115     07/07/12 2200  vancomycin (VANCOCIN) IVPB 1000 mg/200 mL premix  Status:  Discontinued     1,000 mg 200 mL/hr over 60 Minutes Intravenous Every 12 hours 07/07/12 2110 07/09/12 1115   07/07/12 2200  piperacillin-tazobactam (ZOSYN) IVPB 3.375 g     3.375 g 12.5 mL/hr over 240 Minutes Intravenous Every 8 hours 07/07/12 2110     07/07/12 1900  clindamycin (CLEOCIN) IVPB 600 mg     600 mg 100 mL/hr over 30 Minutes Intravenous  Once 07/07/12 1851 07/07/12 1945      Assessment:  72 yo M on chronic anticoagulation with warfarin for A.fib. Warfarin ws held on 4/29 with plans for OR on 4/30 for washing of R prosthesis.  OR plan cancelled for now, plan to try abx first.  Resume warfarin per pharmacy   Warfarin dose prior to admission = 1.25 mg on S/Tues/Thur/Friday and 2.5 mg on all other days; Last dose 4/28  INR therapeutic today.    CBC okay,  No bleeding/complications reported  Goal of Therapy:  INR 2-3 Monitor platelets by anticoagulation protocol: Yes   Plan:  1.) Warfarin 1.25 mg po x 1 tonight at 1800 2.) Daily PT/INR   Annabel Gibeau, Loma Messing PharmD Pager #: [phone] 11:23 AM 07/10/2012

## 2012-07-10 NOTE — Plan of Care (Signed)
Problem: Consults Goal: Skin Care Protocol Initiated - if Braden Score 18 or less If consults are not indicated, leave blank or document N/A  Outcome: Completed/Met Date Met:  07/10/12 WOC  Consult completed

## 2012-07-10 NOTE — Progress Notes (Signed)
ANTIBIOTIC CONSULT NOTE - INITIAL  Pharmacy Consult for Vancomycin & Zosyn Indication: Cellulitis  Allergies  Allergen Reactions  . Amoxicillin     REACTION: swelling and a rash  . Morphine Nausea And Vomiting  . Vancomycin     Pt c/o of feeling flushed and red, arm red around IV site    Patient Measurements: Height: 5\' 11"  (180.3 cm) Weight: 220 lb (99.791 kg) IBW/kg (Calculated) : 75.3  Vital Signs: Temp: 98.3 F (36.8 C) (05/02 0649) Temp src: Oral (05/02 0649) BP: 107/51 mmHg (05/02 0649) Pulse Rate: 62 (05/02 0649) Intake/Output from previous day: 05/01 0701 - 05/02 0700 In: 3459 [P.O.:2280; I.V.:785; IV Piggyback:394] Out: 1251 [Urine:1250; Stool:1] Intake/Output from this shift: Total I/O In: 240 [P.O.:240] Out: -   Labs:  Recent Labs  07/08/12 0345 07/09/12 0400 07/10/12 0355  WBC 8.6 7.1 8.3  HGB 10.6* 9.9* 9.5*  PLT 133* 124* 137*  CREATININE 1.12 1.40* 1.11   Estimated Creatinine Clearance: 71.3 ml/min (by C-G formula based on Cr of 1.11).  Recent Labs  07/09/12 1015  VANCOTROUGH 15.2     Microbiology: Recent Results (from the past 720 hour(s))  CULTURE, BLOOD (ROUTINE X 2)     Status: None   Collection Time    07/07/12  6:50 PM      Result Value Range Status   Specimen Description BLOOD RIGHT ARM   Final   Special Requests BOTTLES DRAWN AEROBIC AND ANAEROBIC 10CC   Final   Culture  Setup Time 07/08/2012 03:19   Final   Culture     Final   Value:        BLOOD CULTURE RECEIVED NO GROWTH TO DATE CULTURE WILL BE HELD FOR 5 DAYS BEFORE ISSUING A FINAL NEGATIVE REPORT   Report Status PENDING   Incomplete  CULTURE, BLOOD (ROUTINE X 2)     Status: None   Collection Time    07/07/12  7:00 PM      Result Value Range Status   Specimen Description BLOOD RIGHT ARM   Final   Special Requests BOTTLES DRAWN AEROBIC AND ANAEROBIC 4CC   Final   Culture  Setup Time 07/07/2012 23:59   Final   Culture     Final   Value:        BLOOD CULTURE RECEIVED NO  GROWTH TO DATE CULTURE WILL BE HELD FOR 5 DAYS BEFORE ISSUING A FINAL NEGATIVE REPORT   Report Status PENDING   Incomplete  GRAM STAIN     Status: None   Collection Time    07/10/12 10:50 AM      Result Value Range Status   Specimen Description SYNOVIAL KNEE   Final   Special Requests NONE   Final   Gram Stain     Final   Value: CYTOSPIN     WBC PRESENT,BOTH PMN AND MONONUCLEAR     NO ORGANISMS SEEN     Gram Stain Report Called to,Read Back By and Verified With: Liliana Cline RN AT 1155 ON 05.02.14 BY SHUEA   Report Status 07/10/2012 FINAL   Final    Medical History: Past Medical History  Diagnosis Date  . HTN (hypertension)   . Obesity   . History of cholecystectomy   . CAD (coronary artery disease)     Status post CABG followed by redo in 1994 / nuclear 2007, old infarct, no ischemia  . Ejection fraction < 50%     EF  45%...echo 2004 /  EF 45%, echo,  August, 2012  . Hyperlipidemia   . Cigarette smoker     He is now smoking cigars and needs to quit.  . 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 no replacement until leg is stable  . Prostate cancer     History of prostate cancer with a seed implant and this is stable.  . Abdominal aortic aneurysm     is stable, being followed  . Atrial fibrillation     Rate control / going in and out of atrial fib, Coumadin  . Volume overload   . Right bundle branch block     noted November 09, 2008.  . Warfarin anticoagulation     Atrial fibrillation  . Carotid artery disease     Doppler September 2 009, 40-50% LICA / Doppler October, 2010, 0-39% R. ICA 40-59% LICA  . Diabetes mellitus   . Myocardial infarction 1984  . Ulcer   . Peripheral vascular disease   . 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 Heart murmur   . Arthritis     Medications:  Scheduled:  . gabapentin  100 mg Oral TID  . insulin aspart  0-15 Units Subcutaneous TID WC  . insulin aspart  0-5 Units  Subcutaneous QHS  . insulin aspart  5 Units Subcutaneous TID WC  . lidocaine (PF)  30 mL Intradermal Once  . magnesium gluconate  500 mg Oral Daily  . metolazone  2.5 mg Oral Daily  . phytonadione (VITAMIN K) IV  5 mg Intravenous Once  . piperacillin-tazobactam (ZOSYN)  IV  3.375 g Intravenous Q8H  . potassium chloride SA  40 mEq Oral QHS  . simvastatin  40 mg Oral QHS  . sodium chloride  3 mL Intravenous Q12H  . spironolactone  25 mg Oral BID  . vancomycin  1,250 mg Intravenous Q12H  . [COMPLETED] warfarin  1.25 mg Oral ONCE-1800  . [DISCONTINUED] furosemide  40 mg Oral QAC supper  . [DISCONTINUED] insulin aspart  0-15 Units Subcutaneous TID WC  . [DISCONTINUED] vancomycin  750 mg Intravenous Q12H  . [DISCONTINUED] warfarin  1.25 mg Oral ONCE-1800  . [DISCONTINUED] Warfarin - Pharmacist Dosing Inpatient   Does not apply q1800   Infusions:  . [EXPIRED] sodium chloride 10 mL/hr at 07/10/12 0225   Assessment:  72 yo M on D3 Vancomycin/Zosyn EI for RLE cellulitis and infected knee prothesis.   Hx of Bilateral Knee replacements and subsequent left ATK amputation.  Vancomycin initially dose for cellulitis, now increasing vancomycin trough goal for more aggressive dosing for prosthetic knee infection.   Scr is back to WNL today, CrCl 71 ml/min   WBC WNL  Blood cultures NGTD  Knee aspirate obtained 5/2 and sent for cultures   Goal of Therapy:  Vancomycin trough 15-20   Plan:  1.) Increase Vancomycin 1250 mg IV q12h  2.)  Continue Zosyn 3.375gm IV q8h (each dose infused over 4 hrs) 3.) Repeat BMET in AM 4.) Check VT at Css 5.) F/u Cultures   Jaide Hillenburg, Loma Messing PharmD Pager #: [phone] 12:41 PM 07/10/2012

## 2012-07-10 NOTE — Progress Notes (Signed)
Patient ID: Derek Frye, male   DOB: [birth year], 72 y.o.   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 He presented to the hospital today directly from Dr. Nilsa Nutting office. He went there for evaluation of right lower extremity edema and redness that he first noticed about 48 hours prior to this admission. It has gotten progressively worse and is now involving the right knee. We have been asked to admit him for further evaluation and management. Please note the patient denies fever, chills, other systemic concerns.   Principal Problem:  Right lower extremity cellulitis, questionable septic arthritis  - Patient clinically improving, he was started on vancomycin and Zosyn, patient remains afebrile, white blood cell count within normal limits this am  - Will continue current antibiotic regimen, will follow up on orrho recommendation regarding the ? Knee joint aspiration  - Continue supportive care with analgesia as needed, keep extremity elevated  Active Problems:  Diarrhea - will check C. Diff by PCR as pt is on broad spectrum ABX Acute renal failure  - now creatinine within normal limits, will d/c IVF and will be able to resume Lasix in AM - BMP in AM Diabetes mellitus type 2  - A1c 6.2 (07/07/2012), continue sliding scale insulin while inpatient, good control while inpatient  - Patient will be able to continue metformin upon discharge as his renal function is stable  Anemia of chronic disease  - Hemoglobin slightly down from admission but overall stable, no current indication for  transfusion  - CBC in AM  DYSLIPIDEMIA  - Stable medical issue, continue simvastatin  AAA  - Stable clinical issue  HTN (hypertension)  - reasonable inpatient control  - continue spironolactone  CAD (coronary artery disease)  - stable medical issue  Atrial fibrillation  - rate controlled, continue coumadin per pharmacy after joint aspiration    Consultants:  Ortho Procedures/Studies:  None Antibiotics:  Vancomycin 04/29 -->  Zosyn 04/29 -->   Code Status: Full  Family Communication: Pt at bedside  Disposition Plan: Not ready for discharge   HPI/Subjective: No events overnight.   Objective: Filed Vitals:   07/09/12 1322 07/09/12 2010 07/09/12 2150 07/10/12 0649  BP: 118/47  118/47 107/51  Pulse: 87 62 63 62  Temp: 98.4 F (36.9 C)  99.8 F (37.7 C) 98.3 F (36.8 C)  TempSrc: Oral  Oral Oral  Resp: 18  20 18   Height:      Weight:      SpO2: 99%  97% 97%    Intake/Output Summary (Last 24 hours) at 07/10/12 1346 Last data filed at 07/10/12 0855  Gross per 24 hour  Intake 3098.97 ml  Output    950 ml  Net 2148.97 ml    Exam:   General:  Pt is alert, follows commands appropriately, not in acute distress  Cardiovascular: Regular rate and rhythm, S1/S2, no murmurs, no rubs, no gallops  Respiratory: Clear to auscultation bilaterally, no wheezing, no crackles, no rhonchi  Abdomen: Soft, non tender, non distended, bowel sounds present, no guarding  Extremities: right LE edema and erythema improving, less tender to palpation but still relatively warm to touch   Neuro: Grossly  nonfocal  Data Reviewed: Basic Metabolic Panel:  Recent Labs Lab 07/07/12 1500 07/08/12 0345 07/09/12 0400 07/10/12 0355  NA 134* 134* 135 132*  K 3.8 3.9 3.5 3.7  CL 94* 97 98 98  CO2 29 28 27 25   GLUCOSE 151* 170* 167* 174*  BUN 27* 33* 38* 29*  CREATININE 1.07 1.12 1.40* 1.11  CALCIUM 9.4 8.5 8.4 8.1*   Liver Function Tests:  Recent Labs Lab 07/07/12 1500  AST 19   ALT 13  ALKPHOS 114  BILITOT 1.3*  PROT 7.9  ALBUMIN 3.4*   CBC:  Recent Labs Lab 07/07/12 1500 07/08/12 0345 07/09/12 0400 07/10/12 0355  WBC 11.9* 8.6 7.1 8.3  NEUTROABS 9.3*  --   --   --   HGB 11.4* 10.6* 9.9* 9.5*  HCT 34.1* 32.1* 29.7* 28.1*  MCV 87.9 85.4 87.6 83.4  PLT 155 133* 124* 137*   CBG:  Recent Labs Lab 07/09/12 1148 07/09/12 1644 07/09/12 2150 07/10/12 0722 07/10/12 1135  GLUCAP 204* 211* 206* 142* 212*    Recent Results (from the past 240 hour(s))  CULTURE, BLOOD (ROUTINE X 2)     Status: None   Collection Time    07/07/12  6:50 PM      Result Value Range Status   Specimen Description BLOOD RIGHT ARM   Final   Special Requests BOTTLES DRAWN AEROBIC AND ANAEROBIC 10CC   Final   Culture  Setup Time 07/08/2012 03:19   Final   Culture     Final   Value:        BLOOD CULTURE RECEIVED NO GROWTH TO DATE CULTURE WILL BE HELD FOR 5 DAYS BEFORE ISSUING A FINAL NEGATIVE REPORT   Report Status PENDING   Incomplete  CULTURE, BLOOD (ROUTINE X 2)     Status: None   Collection Time    07/07/12  7:00 PM      Result Value Range Status   Specimen Description BLOOD RIGHT ARM   Final   Special Requests BOTTLES DRAWN AEROBIC AND ANAEROBIC 4CC   Final   Culture  Setup Time 07/07/2012 23:59   Final   Culture     Final   Value:        BLOOD CULTURE RECEIVED NO GROWTH TO DATE CULTURE WILL BE HELD FOR 5 DAYS BEFORE ISSUING A FINAL NEGATIVE REPORT   Report Status PENDING   Incomplete  GRAM STAIN     Status: None   Collection Time    07/10/12 10:50 AM      Result Value Range Status   Specimen Description SYNOVIAL KNEE   Final   Special Requests NONE   Final   Gram Stain     Final   Value: CYTOSPIN     WBC PRESENT,BOTH PMN AND MONONUCLEAR     NO ORGANISMS SEEN     Gram Stain Report Called to,Read Back By and Verified With: Liliana Cline RN AT 1155 ON 05.02.14 BY SHUEA   Report Status 07/10/2012 FINAL   Final     Scheduled Meds: . gabapentin  100 mg Oral TID  .  insulin aspart  0-15 Units Subcutaneous TID WC  . insulin aspart  0-5 Units Subcutaneous QHS  . insulin aspart  5 Units Subcutaneous TID WC  . lidocaine (PF)  30 mL Intradermal Once  . magnesium gluconate  500 mg Oral Daily  . metolazone  2.5 mg Oral Daily  . phytonadione (VITAMIN K) IV  5 mg Intravenous Once  .  piperacillin-tazobactam (ZOSYN)  IV  3.375 g Intravenous Q8H  . potassium chloride SA  40 mEq Oral QHS  . simvastatin  40 mg Oral QHS  . sodium chloride  3 mL Intravenous Q12H  . spironolactone  25 mg Oral BID  . vancomycin  1,250 mg Intravenous Q12H   Continuous Infusions:   Debbora Presto, MD  [facility] Pager [phone]  If 7PM-7AM, please contact night-coverage [email or web address] Password [email or web address] 07/10/2012, 1:46 PM   LOS: 3 days

## 2012-07-10 NOTE — Progress Notes (Signed)
Inpatient Diabetes Program Recommendations  AACE/ADA: New Consensus Statement on Inpatient Glycemic Control (2013)  Target Ranges:  Prepandial:   less than 140 mg/dL      Peak postprandial:   less than 180 mg/dL (1-2 hours)      Critically ill patients:  140 - 180 mg/dL   Reason for Visit: Hyperglycemia  Results for Derek Frye, Derek Frye (MRN [id number]) as of 07/10/2012 12:18  Ref. Range 07/09/2012 11:48 07/09/2012 16:44 07/09/2012 21:50 07/10/2012 07:22 07/10/2012 11:35  Glucose-Capillary Latest Range: 70-99 mg/dL 409 (H) 811 (H) 914 (H) 142 (H) 212 (H)    Inpatient Diabetes Program Recommendations Insulin - Meal Coverage: Consider increasing meal coverage insulin to Novolog 7 units tidwc  Note: Will follow.  Thank you. Ailene Ards, RD, LDN, CDE Inpatient Diabetes Coordinator [phone]

## 2012-07-10 NOTE — Plan of Care (Signed)
Problem: Phase I Progression Outcomes Goal: Hemodynamically stable Outcome: Progressing Low grade fever at times

## 2012-07-10 NOTE — Progress Notes (Signed)
   Subjective: Right leg cellulitis, with right knee prosthetic joint pain.  Patient reports pain as moderate, pain controlled. Feels as if the redness in the right leg is getting better, especially around the knee. Still having some pain within the knee however.  Objective:   VITALS:   Filed Vitals:   07/10/12 0649  BP: 107/51  Pulse: 62  Temp: 98.3 F (36.8 C)  Resp: 18   Cellulitis of the right lower leg Swelling of the right knee with pain.   LABS  Recent Labs  07/08/12 0345 07/09/12 0400 07/10/12 0355  HGB 10.6* 9.9* 9.5*  HCT 32.1* 29.7* 28.1*  WBC 8.6 7.1 8.3  PLT 133* 124* 137*     Recent Labs  07/08/12 0345 07/09/12 0400 07/10/12 0355  NA 134* 135 132*  K 3.9 3.5 3.7  BUN 33* 38* 29*  CREATININE 1.12 1.40* 1.11  GLUCOSE 170* 167* 174*     Assessment/Plan: Right leg cellulitis, with right knee prosthetic joint pain.  Had a discussion regarding the need to aspirate the knee to evaluate for infection.  He understand and wishes to proceed.  He does understand that there may be a need to I&D the knee and possibly poly exchange to clean out the infection.  After cleaning the area with 3 betadine swabs the area was injected with 5cc of 1% Lidocaine with epi using a 22 gauge needle. After this had time to numb the area, it  was again cleaned with 3 betadine swabs and under sterile technique an 18 gauge need was used to aspirate 50 cc of cloudy serous sanguinous fluid from the knee which was tinged with blood. This was sent to the lab for cell count with differential, cultures and gram stain.  We will follow the labs and make a determination of what, if anything, needs to be done.     Anastasio Auerbach Dayja Loveridge   PAC  07/10/2012, 12:35 PM

## 2012-07-11 LAB — GLUCOSE, CAPILLARY
Glucose-Capillary: 147 mg/dL — ABNORMAL HIGH (ref 70–99)
Glucose-Capillary: 198 mg/dL — ABNORMAL HIGH (ref 70–99)
Glucose-Capillary: 188 mg/dL — ABNORMAL HIGH (ref 70–99)

## 2012-07-11 LAB — PROTIME-INR: Prothrombin Time: 18.5 seconds — ABNORMAL HIGH (ref 11.6–15.2)

## 2012-07-11 MED ORDER — TRAMADOL HCL 50 MG PO TABS
50.0000 mg | ORAL_TABLET | Freq: Four times a day (QID) | ORAL | Status: DC | PRN
  Administered 2012-07-11 – 2012-07-17 (×9): 50 mg via ORAL
  Filled 2012-07-11 (×9): qty 1

## 2012-07-11 NOTE — Plan of Care (Signed)
Problem: Phase I Progression Outcomes Goal: OOB as tolerated unless otherwise ordered Outcome: Completed/Met Date Met:  07/11/12 oob to chair for 2 hours on 5/2

## 2012-07-11 NOTE — Progress Notes (Signed)
Patient ID: Derek Frye, male   DOB: [birth year], 72 y.o.   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 He presented to the hospital today directly from Dr. Nilsa Nutting office. He went there for evaluation of right lower extremity edema and redness that he first noticed about 48 hours prior to this admission. It has gotten progressively worse and is now involving the right knee. We have been asked to admit him for further evaluation and management. Please note the patient denies fever, chills, other systemic concerns.   Principal Problem:  Right lower extremity cellulitis, questionable septic arthritis  - Patient clinically improving, he was started on vancomycin and Zosyn, patient remains afebrile, white blood cell count within normal limits this am  - Will continue current antibiotic regimen, pt is status post right knee joint aspiration, will need to follow upon fluid analysis  - Continue supportive care with analgesia as needed, keep extremity elevated  Active Problems:  Diarrhea  - will check C. Diff by PCR as pt is on broad spectrum ABX  Acute renal failure  - now creatinine within normal limits, resume Lasix this AM - BMP in AM  Diabetes mellitus type 2  - A1c 6.2 (07/07/2012), continue sliding scale insulin while inpatient, good control while inpatient  - Patient will be able to continue metformin upon discharge as his renal function is stable  Anemia of chronic disease  - Hemoglobin overall stable, no current indication for transfusion  - CBC in AM  DYSLIPIDEMIA  -  Stable medical issue, continue simvastatin  AAA  - Stable clinical issue  HTN (hypertension)  - reasonable inpatient control  - continue spironolactone  CAD (coronary artery disease)  - stable medical issue  Atrial fibrillation  - rate controlled, continue coumadin per pharmacy after joint aspiration   Consultants:  Ortho Procedures/Studies:  Right knee aspiration 05/02 00 --> fluid analysis pending  Antibiotics:  Vancomycin 04/29 -->  Zosyn 04/29 -->   Code Status: Full  Family Communication: Pt at bedside  Disposition Plan: Not ready for discharge   HPI/Subjective: No events overnight.   Objective: Filed Vitals:   07/10/12 1310 07/10/12 2145 07/10/12 2154 07/11/12 0610  BP: 151/64  133/57 118/66  Pulse: 65 62 55 62  Temp: 99 F (37.2 C)  99.3 F (37.4 C) 98.6 F (37 C)  TempSrc: Oral  Oral Oral  Resp: 16  18 18   Height:      Weight:      SpO2: 95%  96% 96%    Intake/Output Summary (Last 24 hours) at 07/11/12 1149 Last data filed at 07/11/12 0900  Gross per 24 hour  Intake 2179.88 ml  Output    901 ml  Net 1278.88 ml    Exam:   General:  Pt is alert, follows commands appropriately, not in acute distress  Cardiovascular: Regular rate and rhythm, S1/S2, no murmurs, no rubs, no gallops  Respiratory: Clear to auscultation bilaterally, no wheezing, no crackles, no rhonchi  Abdomen: Soft, non tender, non distended, bowel sounds present, no guarding  Extremities: right lower extremity erythema improving, not as tender to palpation, warm to touch   Neuro: Grossly nonfocal  Data Reviewed: Basic Metabolic Panel:  Recent Labs Lab 07/07/12 1500 07/08/12 0345 07/09/12 0400 07/10/12 0355  NA 134* 134* 135 132*  K 3.8 3.9 3.5 3.7  CL 94* 97 98 98  CO2 29 28 27 25   GLUCOSE 151* 170* 167* 174*  BUN 27* 33* 38* 29*  CREATININE 1.07 1.12 1.40* 1.11  CALCIUM 9.4 8.5 8.4 8.1*   Liver Function Tests:  Recent Labs Lab 07/07/12 1500  AST 19  ALT 13   ALKPHOS 114  BILITOT 1.3*  PROT 7.9  ALBUMIN 3.4*   No results found for this basename: LIPASE, AMYLASE,  in the last 168 hours No results found for this basename: AMMONIA,  in the last 168 hours CBC:  Recent Labs Lab 07/07/12 1500 07/08/12 0345 07/09/12 0400 07/10/12 0355  WBC 11.9* 8.6 7.1 8.3  NEUTROABS 9.3*  --   --   --   HGB 11.4* 10.6* 9.9* 9.5*  HCT 34.1* 32.1* 29.7* 28.1*  MCV 87.9 85.4 87.6 83.4  PLT 155 133* 124* 137*   CBG:  Recent Labs Lab 07/10/12 0722 07/10/12 1135 07/10/12 1649 07/10/12 2053 07/11/12 0727  GLUCAP 142* 212* 165* 164* 147*    Recent Results (from the past 240 hour(s))  CULTURE, BLOOD (ROUTINE X 2)     Status: None   Collection Time    07/07/12  6:50 PM      Result Value Range Status   Specimen Description BLOOD RIGHT ARM   Final   Special Requests BOTTLES DRAWN AEROBIC AND ANAEROBIC 10CC   Final   Culture  Setup Time 07/08/2012 03:19   Final   Culture     Final   Value:        BLOOD CULTURE RECEIVED NO GROWTH TO DATE CULTURE WILL BE HELD FOR 5 DAYS BEFORE ISSUING A FINAL NEGATIVE REPORT   Report Status PENDING   Incomplete  CULTURE, BLOOD (ROUTINE X 2)     Status: None   Collection Time    07/07/12  7:00 PM      Result Value Range Status   Specimen Description BLOOD RIGHT ARM   Final   Special Requests BOTTLES DRAWN AEROBIC AND ANAEROBIC 4CC   Final   Culture  Setup Time 07/07/2012 23:59   Final   Culture     Final   Value:        BLOOD CULTURE RECEIVED NO GROWTH TO DATE CULTURE WILL BE HELD FOR 5 DAYS BEFORE ISSUING A FINAL NEGATIVE REPORT   Report Status PENDING   Incomplete  GRAM STAIN     Status: None   Collection Time    07/10/12 10:50 AM      Result Value Range Status   Specimen Description SYNOVIAL KNEE   Final   Special Requests NONE   Final   Gram Stain     Final   Value: CYTOSPIN     WBC PRESENT,BOTH PMN AND MONONUCLEAR     NO ORGANISMS SEEN     Gram Stain Report Called to,Read Back By and Verified With: Liliana Cline RN AT 1155 ON 05.02.14 BY SHUEA   Report Status 07/10/2012 FINAL   Final     Scheduled Meds: . furosemide  40 mg Oral QAC supper  . gabapentin  100 mg Oral TID  . insulin aspart  0-15 Units Subcutaneous TID WC  . insulin aspart  0-5 Units Subcutaneous QHS  . insulin aspart  7 Units Subcutaneous TID WC  . magnesium gluconate  500 mg Oral Daily  . metolazone  2.5 mg Oral Daily  . piperacillin-tazobactam (ZOSYN)  IV  3.375 g Intravenous Q8H  . potassium chloride SA  40 mEq Oral QHS  . simvastatin  40 mg Oral QHS  . sodium chloride  3 mL Intravenous Q12H  . spironolactone  25 mg Oral BID  . vancomycin  1,250 mg Intravenous Q12H   Continuous Infusions:   Debbora Presto, MD  [facility] Pager [phone]  If 7PM-7AM, please contact night-coverage [email or web address] Password [email or web address] 07/11/2012, 11:49 AM   LOS: 4 days

## 2012-07-12 ENCOUNTER — Encounter (HOSPITAL_COMMUNITY): Payer: Self-pay | Admitting: Certified Registered"

## 2012-07-12 ENCOUNTER — Encounter (HOSPITAL_COMMUNITY)
Admission: EM | Disposition: A | Discharge status: Home Health | Payer: Self-pay | Source: Home / Self Care | Attending: Internal Medicine

## 2012-07-12 LAB — GLUCOSE, CAPILLARY
Glucose-Capillary: 124 mg/dL — ABNORMAL HIGH (ref 70–99)
Glucose-Capillary: 170 mg/dL — ABNORMAL HIGH (ref 70–99)
Glucose-Capillary: 199 mg/dL — ABNORMAL HIGH (ref 70–99)
Glucose-Capillary: 218 mg/dL — ABNORMAL HIGH (ref 70–99)

## 2012-07-12 LAB — SURGICAL PCR SCREEN: MRSA, PCR: NEGATIVE

## 2012-07-12 LAB — CBC
HCT: 30.2 % — ABNORMAL LOW (ref 39.0–52.0)
Hemoglobin: 9.6 g/dL — ABNORMAL LOW (ref 13.0–17.0)
MCH: 27.3 pg (ref 26.0–34.0)
MCHC: 31.8 g/dL (ref 30.0–36.0)
RBC: 3.52 MIL/uL — ABNORMAL LOW (ref 4.22–5.81)

## 2012-07-12 LAB — BASIC METABOLIC PANEL
BUN: 24 mg/dL — ABNORMAL HIGH (ref 6–23)
CO2: 27 mEq/L (ref 19–32)
GFR calc non Af Amer: 71 mL/min — ABNORMAL LOW (ref >90)
Glucose, Bld: 153 mg/dL — ABNORMAL HIGH (ref 70–99)
Potassium: 3.8 mEq/L (ref 3.5–5.1)

## 2012-07-12 LAB — CLOSTRIDIUM DIFFICILE BY PCR: Toxigenic C. Difficile by PCR: NEGATIVE

## 2012-07-12 LAB — PROTIME-INR: INR: 1.35 (ref 0.00–1.49)

## 2012-07-12 SURGERY — IRRIGATION AND DEBRIDEMENT KNEE WITH POLY EXCHANGE
Anesthesia: Choice | Laterality: Right

## 2012-07-12 NOTE — Progress Notes (Signed)
CRITICAL VALUE ALERT  Critical value received:  Rt Knee Synovial Fluid drawn 07/10/12 = rare gram negative rods day #2 of growth.  Date of notification:  07/12/12  Time of notification:  1255  Critical value read back:yes  Nurse who received alert:  Orlene Och RN  MD notified (1st page):  Charlann Boxer  Time of first page:  1215  MD notified (2nd page):  Time of second page:  Responding MD:  Charlann Boxer  Time MD responded:  1220 - Value verbally relayed to MD by OR RN.

## 2012-07-12 NOTE — Progress Notes (Signed)
Patient ID: Derek Frye, male   DOB: [birth year], 72 y.o.   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 He presented to the hospital today directly from Dr. Nilsa Nutting office. He went there for evaluation of right lower extremity edema and redness that he first noticed about 48 hours prior to this admission. It has gotten progressively worse and is now involving the right knee. We have been asked to admit him for further evaluation and management. Please note the patient denies fever, chills, other systemic concerns.   Principal Problem:  Right lower extremity cellulitis, questionable septic arthritis  - Patient clinically improving, he was started on vancomycin and Zosyn, patient remains afebrile, white blood cell count within normal limits this am  - Will continue current antibiotic regimen with plan on narrowing spectrum based on the fluid analysis, pt is status post right knee joint aspiration, follow upon fluid analysis  - Continue supportive care with analgesia as needed, keep extremity elevated  Active Problems:  Diarrhea  - pending C. Diff by PCR as pt is on broad spectrum ABX  Acute renal failure  - now creatinine within normal limits, continue Lasix  - BMP in AM  Diabetes mellitus type 2  - A1c 6.2 (07/07/2012), continue sliding scale insulin while inpatient, good control while inpatient  - Patient will be able to continue metformin upon discharge as his renal function is stable  Anemia of chronic disease  - Hemoglobin overall stable, no current indication for transfusion   - CBC in AM  DYSLIPIDEMIA  - Stable medical issue, continue simvastatin  AAA  - Stable clinical issue  HTN (hypertension)  - reasonable inpatient control  - continue spironolactone  CAD (coronary artery disease)  - stable medical issue  Atrial fibrillation  - rate controlled, continue coumadin per pharmacy after I&D  Consultants:  Ortho Procedures/Studies:  Right knee aspiration 05/02 00 --> fluid analysis pending  Antibiotics:  Vancomycin 04/29 -->  Zosyn 04/29 -->   Code Status: Full  Family Communication: Pt at bedside  Disposition Plan: Not ready for discharge    HPI/Subjective: No events overnight.   Objective: Filed Vitals:   07/11/12 0610 07/11/12 1450 07/11/12 2241 07/12/12 0651  BP: 118/66 133/66 130/60 104/46  Pulse: 62 88 65 53  Temp: 98.6 F (37 C) 98.5 F (36.9 C) 98.9 F (37.2 C) 98.3 F (36.8 C)  TempSrc: Oral Oral Oral Oral  Resp: 18 18 18 18   Height:      Weight:      SpO2: 96% 98% 97% 95%    Intake/Output Summary (Last 24 hours) at 07/12/12 9562 Last data filed at 07/11/12 2358  Gross per 24 hour  Intake   1140 ml  Output   2050 ml  Net   -910 ml    Exam:  General: Pt is alert, follows commands appropriately, not in acute distress  Cardiovascular: Regular rate and rhythm, S1/S2, no murmurs, no rubs, no gallops  Respiratory: Clear to auscultation bilaterally, no wheezing, no crackles, no rhonchi  Abdomen: Soft, non tender, non distended, bowel sounds present, no guarding  Extremities: right lower extremity erythema improving, not as tender to palpation,  warm to touch  Neuro: Grossly nonfocal  Data Reviewed: Basic Metabolic Panel:  Recent Labs Lab 07/07/12 1500 07/08/12 0345 07/09/12 0400 07/10/12 0355 07/12/12 0410  NA 134* 134* 135 132* 135  K 3.8 3.9 3.5 3.7 3.8  CL 94* 97 98 98 99  CO2 29 28 27 25 27   GLUCOSE 151* 170* 167* 174* 153*  BUN 27* 33* 38* 29* 24*  CREATININE 1.07 1.12 1.40* 1.11 1.02  CALCIUM 9.4 8.5 8.4  8.1* 8.7   Liver Function Tests:  Recent Labs Lab 07/07/12 1500  AST 19  ALT 13  ALKPHOS 114  BILITOT 1.3*  PROT 7.9  ALBUMIN 3.4*   CBC:  Recent Labs Lab 07/07/12 1500 07/08/12 0345 07/09/12 0400 07/10/12 0355 07/12/12 0410  WBC 11.9* 8.6 7.1 8.3 9.8  NEUTROABS 9.3*  --   --   --   --   HGB 11.4* 10.6* 9.9* 9.5* 9.6*  HCT 34.1* 32.1* 29.7* 28.1* 30.2*  MCV 87.9 85.4 87.6 83.4 85.8  PLT 155 133* 124* 137* 170   CBG:  Recent Labs Lab 07/11/12 0727 07/11/12 1151 07/11/12 1657 07/11/12 2239 07/12/12 0738  GLUCAP 147* 198* 188* 156* 124*    Recent Results (from the past 240 hour(s))  CULTURE, BLOOD (ROUTINE X 2)     Status: None   Collection Time    07/07/12  6:50 PM      Result Value Range Status   Specimen Description BLOOD RIGHT ARM   Final   Special Requests BOTTLES DRAWN AEROBIC AND ANAEROBIC 10CC   Final   Culture  Setup Time 07/08/2012 03:19   Final   Culture     Final   Value:        BLOOD CULTURE RECEIVED NO GROWTH TO DATE CULTURE WILL BE HELD FOR 5 DAYS BEFORE ISSUING A FINAL NEGATIVE REPORT   Report Status PENDING   Incomplete  CULTURE, BLOOD (ROUTINE X 2)     Status: None   Collection Time    07/07/12  7:00 PM      Result Value Range Status   Specimen Description BLOOD RIGHT ARM   Final   Special Requests BOTTLES DRAWN AEROBIC AND ANAEROBIC 4CC   Final   Culture  Setup Time 07/07/2012 23:59   Final   Culture     Final   Value:        BLOOD CULTURE RECEIVED NO GROWTH TO DATE CULTURE WILL BE HELD FOR 5 DAYS BEFORE ISSUING A FINAL NEGATIVE REPORT   Report Status PENDING   Incomplete  BODY FLUID CULTURE     Status: None   Collection Time    07/10/12 10:50 AM      Result Value Range Status   Specimen Description SYNOVIAL KNEE   Final   Special Requests NONE   Final   Gram Stain PENDING   Incomplete   Culture NO GROWTH 1 DAY   Final   Report Status PENDING   Incomplete  GRAM STAIN     Status: None   Collection Time    07/10/12 10:50 AM       Result Value Range Status   Specimen Description SYNOVIAL KNEE   Final   Special Requests NONE   Final   Gram Stain     Final   Value: CYTOSPIN     WBC PRESENT,BOTH PMN AND MONONUCLEAR     NO ORGANISMS SEEN     Gram Stain Report Called to,Read Back By and Verified With: K.  Ladona Ridgel RN AT 1155 ON 05.02.14 BY SHUEA   Report Status 07/10/2012 FINAL   Final     Scheduled Meds: . furosemide  40 mg Oral QAC supper  . gabapentin  100 mg Oral TID  . insulin aspart  0-15 Units Subcutaneous TID WC  . insulin aspart  0-5 Units Subcutaneous QHS  . insulin aspart  7 Units Subcutaneous TID WC  . magnesium gluconate  500 mg Oral Daily  . metolazone  2.5 mg Oral Daily  . piperacillin-tazobactam (ZOSYN)  IV  3.375 g Intravenous Q8H  . potassium chloride SA  40 mEq Oral QHS  . simvastatin  40 mg Oral QHS  . sodium chloride  3 mL Intravenous Q12H  . spironolactone  25 mg Oral BID  . vancomycin  1,250 mg Intravenous Q12H   Continuous Infusions:   Debbora Presto, MD  [facility] Pager [phone]  If 7PM-7AM, please contact night-coverage [email or web address] Password [email or web address] 07/12/2012, 9:17 AM   LOS: 5 days

## 2012-07-12 NOTE — Progress Notes (Signed)
NPO & Consent for I&D R Knee orders noted. Pt had received breakfast tray prior to time NPO order entered. Pt instructed to be NPO from 8 AM on. Voiced understanding. Consent not signed at this time. Pt wishes to talk w/MD before signing consent.Hartley Barefoot

## 2012-07-13 ENCOUNTER — Inpatient Hospital Stay (HOSPITAL_COMMUNITY): Payer: Medicare Other | Admitting: Anesthesiology

## 2012-07-13 ENCOUNTER — Encounter (HOSPITAL_COMMUNITY): Payer: Self-pay | Admitting: Anesthesiology

## 2012-07-13 ENCOUNTER — Encounter (HOSPITAL_COMMUNITY)
Admission: EM | Disposition: A | Discharge status: Home Health | Payer: Self-pay | Source: Home / Self Care | Attending: Internal Medicine

## 2012-07-13 HISTORY — PX: I & D KNEE WITH POLY EXCHANGE: SHX5024

## 2012-07-13 LAB — CBC
Hemoglobin: 9.4 g/dL — ABNORMAL LOW (ref 13.0–17.0)
MCH: 28 pg (ref 26.0–34.0)
MCHC: 32.3 g/dL (ref 30.0–36.0)
Platelets: 192 10*3/uL (ref 150–400)
RDW: 15.9 % — ABNORMAL HIGH (ref 11.5–15.5)

## 2012-07-13 LAB — BASIC METABOLIC PANEL
Calcium: 8.7 mg/dL (ref 8.4–10.5)
GFR calc Af Amer: 74 mL/min — ABNORMAL LOW (ref >90)
GFR calc non Af Amer: 64 mL/min — ABNORMAL LOW (ref >90)
Glucose, Bld: 117 mg/dL — ABNORMAL HIGH (ref 70–99)
Potassium: 3.6 mEq/L (ref 3.5–5.1)
Sodium: 136 mEq/L (ref 135–145)

## 2012-07-13 LAB — BODY FLUID CULTURE

## 2012-07-13 LAB — GLUCOSE, CAPILLARY
Glucose-Capillary: 109 mg/dL — ABNORMAL HIGH (ref 70–99)
Glucose-Capillary: 102 mg/dL — ABNORMAL HIGH (ref 70–99)

## 2012-07-13 LAB — PROTIME-INR
INR: 1.41 (ref 0.00–1.49)
Prothrombin Time: 16.9 seconds — ABNORMAL HIGH (ref 11.6–15.2)

## 2012-07-13 SURGERY — IRRIGATION AND DEBRIDEMENT KNEE WITH POLY EXCHANGE
Anesthesia: General | Site: Knee | Laterality: Right | Wound class: Dirty or Infected

## 2012-07-13 MED ORDER — EPHEDRINE SULFATE 50 MG/ML IJ SOLN
INTRAMUSCULAR | Status: DC | PRN
  Administered 2012-07-13: 10 mg via INTRAVENOUS

## 2012-07-13 MED ORDER — FENTANYL CITRATE 0.05 MG/ML IJ SOLN
25.0000 ug | INTRAMUSCULAR | Status: DC | PRN
  Administered 2012-07-13: 50 ug via INTRAVENOUS

## 2012-07-13 MED ORDER — PROMETHAZINE HCL 25 MG/ML IJ SOLN: 6.2500 mg | INTRAMUSCULAR | Status: DC | PRN

## 2012-07-13 MED ORDER — SODIUM CHLORIDE 0.9 % IR SOLN
Status: DC | PRN
  Administered 2012-07-13: 9000 mL

## 2012-07-13 MED ORDER — METRONIDAZOLE IN NACL 5-0.79 MG/ML-% IV SOLN
500.0000 mg | Freq: Three times a day (TID) | INTRAVENOUS | Status: DC
  Administered 2012-07-13 – 2012-07-16 (×9): 500 mg via INTRAVENOUS
  Filled 2012-07-13 (×10): qty 100

## 2012-07-13 MED ORDER — SODIUM CHLORIDE 0.9 % IV SOLN: 250.0000 mL | INTRAVENOUS | Status: DC | PRN

## 2012-07-13 MED ORDER — WARFARIN - PHARMACIST DOSING INPATIENT: Freq: Every day | Status: DC

## 2012-07-13 MED ORDER — 0.9 % SODIUM CHLORIDE (POUR BTL) OPTIME
TOPICAL | Status: DC | PRN
  Administered 2012-07-13: 2000 mL

## 2012-07-13 MED ORDER — DEXTROSE 5 % IV SOLN
1.0000 g | INTRAVENOUS | Status: DC
  Administered 2012-07-13 – 2012-07-14 (×2): 1 g via INTRAVENOUS
  Filled 2012-07-13 (×3): qty 10

## 2012-07-13 MED ORDER — WARFARIN SODIUM 3 MG PO TABS
3.0000 mg | ORAL_TABLET | ORAL | Status: AC
  Administered 2012-07-13: 3 mg via ORAL
  Filled 2012-07-13: qty 1

## 2012-07-13 MED ORDER — LACTATED RINGERS IV SOLN
INTRAVENOUS | Status: DC | PRN
  Administered 2012-07-13: 19:00:00 via INTRAVENOUS

## 2012-07-13 MED ORDER — MEPERIDINE HCL 25 MG/ML IJ SOLN: 6.2500 mg | INTRAMUSCULAR | Status: DC | PRN

## 2012-07-13 MED ORDER — FENTANYL CITRATE 0.05 MG/ML IJ SOLN
INTRAMUSCULAR | Status: DC | PRN
  Administered 2012-07-13 (×5): 50 ug via INTRAVENOUS

## 2012-07-13 MED ORDER — ONDANSETRON HCL 4 MG/2ML IJ SOLN
INTRAMUSCULAR | Status: DC | PRN
  Administered 2012-07-13: 4 mg via INTRAVENOUS

## 2012-07-13 MED ORDER — PROPOFOL 10 MG/ML IV BOLUS
INTRAVENOUS | Status: DC | PRN
  Administered 2012-07-13: 120 mg via INTRAVENOUS
  Administered 2012-07-13: 30 mg via INTRAVENOUS

## 2012-07-13 MED ORDER — OXYCODONE HCL 5 MG PO TABS
5.0000 mg | ORAL_TABLET | ORAL | Status: DC | PRN
  Administered 2012-07-14: 10 mg via ORAL
  Administered 2012-07-14: 15 mg via ORAL
  Administered 2012-07-14: 10 mg via ORAL
  Administered 2012-07-15: 15 mg via ORAL
  Administered 2012-07-15 – 2012-07-16 (×2): 10 mg via ORAL
  Filled 2012-07-13 (×4): qty 2
  Filled 2012-07-13 (×2): qty 3

## 2012-07-13 SURGICAL SUPPLY — 75 items
BAG SPEC THK2 15X12 ZIP CLS (MISCELLANEOUS) ×1
BAG ZIPLOCK 12X15 (MISCELLANEOUS) ×2 IMPLANT
BANDAGE ELASTIC 4 VELCRO ST LF (GAUZE/BANDAGES/DRESSINGS) ×1 IMPLANT
BANDAGE ELASTIC 6 VELCRO ST LF (GAUZE/BANDAGES/DRESSINGS) ×2 IMPLANT
BANDAGE ESMARK 6X9 LF (GAUZE/BANDAGES/DRESSINGS) ×1 IMPLANT
BANDAGE GAUZE ELAST BULKY 4 IN (GAUZE/BANDAGES/DRESSINGS) ×1 IMPLANT
BNDG CMPR 9X6 STRL LF SNTH (GAUZE/BANDAGES/DRESSINGS) ×1
BNDG CMPR MED 15X6 ELC VLCR LF (GAUZE/BANDAGES/DRESSINGS) ×1
BNDG ELASTIC 6X15 VLCR STRL LF (GAUZE/BANDAGES/DRESSINGS) ×1 IMPLANT
BNDG ESMARK 6X9 LF (GAUZE/BANDAGES/DRESSINGS) ×2
CLOTH BEACON ORANGE TIMEOUT ST (SAFETY) ×2 IMPLANT
CONT SPECI 4OZ STER CLIK (MISCELLANEOUS) ×1 IMPLANT
CUFF TOURN SGL QUICK 34 (TOURNIQUET CUFF) ×2
CUFF TRNQT CYL 34X4X40X1 (TOURNIQUET CUFF) ×1 IMPLANT
DECANTER SPIKE VIAL GLASS SM (MISCELLANEOUS) ×1 IMPLANT
DRAPE EXTREMITY T 121X128X90 (DRAPE) ×2 IMPLANT
DRAPE POUCH INSTRU U-SHP 10X18 (DRAPES) ×2 IMPLANT
DRAPE U-SHAPE 47X51 STRL (DRAPES) ×2 IMPLANT
DRSG ADAPTIC 3X8 NADH LF (GAUZE/BANDAGES/DRESSINGS) ×1 IMPLANT
DRSG PAD ABDOMINAL 8X10 ST (GAUZE/BANDAGES/DRESSINGS) ×3 IMPLANT
DRSG TEGADERM 4X4.75 (GAUZE/BANDAGES/DRESSINGS) ×1 IMPLANT
DURAPREP 26ML APPLICATOR (WOUND CARE) ×2 IMPLANT
ELECT REM PT RETURN 9FT ADLT (ELECTROSURGICAL) ×2
ELECTRODE REM PT RTRN 9FT ADLT (ELECTROSURGICAL) ×1 IMPLANT
EVACUATOR 1/8 PVC DRAIN (DRAIN) ×2 IMPLANT
FACESHIELD LNG OPTICON STERILE (SAFETY) ×7 IMPLANT
FLOSEAL (HEMOSTASIS) ×1 IMPLANT
GAUZE SPONGE 2X2 8PLY STRL LF (GAUZE/BANDAGES/DRESSINGS) IMPLANT
GAUZE XEROFORM 5X9 LF (GAUZE/BANDAGES/DRESSINGS) ×1 IMPLANT
GLOVE BIOGEL PI IND STRL 7.5 (GLOVE) ×1 IMPLANT
GLOVE BIOGEL PI IND STRL 8 (GLOVE) ×1 IMPLANT
GLOVE BIOGEL PI INDICATOR 7.5 (GLOVE) ×1
GLOVE BIOGEL PI INDICATOR 8 (GLOVE) ×1
GLOVE ECLIPSE 8.0 STRL XLNG CF (GLOVE) ×2 IMPLANT
GLOVE ORTHO TXT STRL SZ7.5 (GLOVE) ×5 IMPLANT
GLOVE SURG ORTHO 8.0 STRL STRW (GLOVE) ×1 IMPLANT
GLOVE SURG SS PI 8.5 STRL IVOR (GLOVE) ×3
GLOVE SURG SS PI 8.5 STRL STRW (GLOVE) IMPLANT
GOWN BRE IMP PREV XXLGXLNG (GOWN DISPOSABLE) ×4 IMPLANT
GOWN STRL NON-REIN LRG LVL3 (GOWN DISPOSABLE) ×2 IMPLANT
GOWN STRL REIN 2XL XLG LVL4 (GOWN DISPOSABLE) ×1 IMPLANT
HANDPIECE INTERPULSE COAX TIP (DISPOSABLE) ×2
IMMOBILIZER KNEE 20 (SOFTGOODS) ×2
IMMOBILIZER KNEE 20 THIGH 36 (SOFTGOODS) IMPLANT
INSERT TIBIA SCORPIO FLX 11X12 (Orthopedic Implant) ×1 IMPLANT
IV NS IRRIG 3000ML ARTHROMATIC (IV SOLUTION) ×3 IMPLANT
KIT BASIN OR (CUSTOM PROCEDURE TRAY) ×2 IMPLANT
MANIFOLD NEPTUNE II (INSTRUMENTS) ×2 IMPLANT
NDL SAFETY ECLIPSE 18X1.5 (NEEDLE) ×1 IMPLANT
NEEDLE HYPO 18GX1.5 SHARP (NEEDLE)
NS IRRIG 1000ML POUR BTL (IV SOLUTION) ×4 IMPLANT
PACK TOTAL JOINT (CUSTOM PROCEDURE TRAY) ×2 IMPLANT
PADDING CAST COTTON 6X4 STRL (CAST SUPPLIES) ×2 IMPLANT
POSITIONER SURGICAL ARM (MISCELLANEOUS) ×2 IMPLANT
SET HNDPC FAN SPRY TIP SCT (DISPOSABLE) ×1 IMPLANT
SET PAD KNEE POSITIONER (MISCELLANEOUS) ×1 IMPLANT
SPONGE GAUZE 2X2 STER 10/PKG (GAUZE/BANDAGES/DRESSINGS) ×1
SPONGE GAUZE 4X4 12PLY (GAUZE/BANDAGES/DRESSINGS) ×3 IMPLANT
SPONGE LAP 18X18 X RAY DECT (DISPOSABLE) ×5 IMPLANT
STAPLER VISISTAT 35W (STAPLE) ×1 IMPLANT
STRIP CLOSURE SKIN 1/2X4 (GAUZE/BANDAGES/DRESSINGS) ×2 IMPLANT
SUCTION FRAZIER 12FR DISP (SUCTIONS) ×1 IMPLANT
SUT ETHILON 2 0 PS N (SUTURE) ×2 IMPLANT
SUT MNCRL AB 4-0 PS2 18 (SUTURE) ×1 IMPLANT
SUT VIC AB 1 CT1 36 (SUTURE) ×6 IMPLANT
SUT VIC AB 2-0 CT1 27 (SUTURE) ×8
SUT VIC AB 2-0 CT1 TAPERPNT 27 (SUTURE) ×3 IMPLANT
SWAB COLLECTION DEVICE MRSA (MISCELLANEOUS) ×1 IMPLANT
SYR 50ML LL SCALE MARK (SYRINGE) ×1 IMPLANT
SYR CONTROL 10ML LL (SYRINGE) ×1 IMPLANT
TOWEL OR 17X26 10 PK STRL BLUE (TOWEL DISPOSABLE) ×4 IMPLANT
TRAY FOLEY CATH 14FRSI W/METER (CATHETERS) ×2 IMPLANT
TUBE ANAEROBIC SPECIMEN COL (MISCELLANEOUS) ×1 IMPLANT
WATER STERILE IRR 1500ML POUR (IV SOLUTION) ×1 IMPLANT
WRAP KNEE MAXI GEL POST OP (GAUZE/BANDAGES/DRESSINGS) ×3 IMPLANT

## 2012-07-13 NOTE — Brief Op Note (Signed)
07/07/2012 - 07/13/2012  9:25 PM  PATIENT:  Derek Frye  72 y.o. 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  PROCEDURE:  Procedure(s): IRRIGATION AND DEBRIDEMENT KNEE WITH POLY EXCHANGE (Right)  SURGEON:  Surgeon(s) and Role:    * Shelda Pal, MD - Primary  PHYSICIAN ASSISTANT: Lanney Gins, PA-C  ANESTHESIA:   general  EBL:  Total I/O In: -  Out: 350 [Urine:125; Blood:225]  BLOOD ADMINISTERED:none  DRAINS: (1 medium ) Hemovact drain(s) in the right knee with  Suction Open   LOCAL MEDICATIONS USED:  NONE  SPECIMEN:  No Specimen  DISPOSITION OF SPECIMEN:  N/A  COUNTS:  YES  TOURNIQUET:   Total Tourniquet Time Documented: Thigh (laterality) - 6 minutes Total: Thigh (laterality) - 6 minutes   DICTATION: .Other Dictation: Dictation Number [id number]  PLAN OF CARE: Admit to inpatient   PATIENT DISPOSITION:  PACU - hemodynamically stable.   Delay start of Pharmacological VTE agent (>24hrs) due to surgical blood loss or risk of bleeding: no

## 2012-07-13 NOTE — Anesthesia Preprocedure Evaluation (Addendum)
Anesthesia Evaluation  Patient identified by MRN, date of birth, ID band Patient awake    Reviewed: Allergy & Precautions, H&P , NPO status , Patient's Chart, lab work & pertinent test results  History of Anesthesia Complications Negative for: history of anesthetic complications  Airway Mallampati: II TM Distance: >3 FB Neck ROM: full    Dental  (+) Edentulous Upper and Dental Advisory Given   Pulmonary Current Smoker,    + decreased breath sounds      Cardiovascular hypertension, Pt. on medications + CAD, + Past MI, + CABG and + Peripheral Vascular Disease + dysrhythmias Atrial Fibrillation     Neuro/Psych negative neurological ROS     GI/Hepatic negative GI ROS, Neg liver ROS,   Endo/Other  diabetes, Type 2, Oral Hypoglycemic Agents and Insulin Dependentobese  Renal/GU negative Renal ROS     Musculoskeletal  (+) Arthritis -,   Abdominal   Peds  Hematology negative hematology ROS (+)   Anesthesia Other Findings   Reproductive/Obstetrics negative OB ROS                           Anesthesia Physical  Anesthesia Plan  ASA: III  Anesthesia Plan: General   Post-op Pain Management:    Induction: Intravenous  Airway Management Planned: LMA  Additional Equipment:   Intra-op Plan:   Post-operative Plan: Extubation in OR  Informed Consent: I have reviewed the patients History and Physical, chart, labs and discussed the procedure including the risks, benefits and alternatives for the proposed anesthesia with the patient or authorized representative who has indicated his/her understanding and acceptance.     Plan Discussed with: CRNA and Surgeon  Anesthesia Plan Comments: (INR too high for SAB)      Anesthesia Quick Evaluation

## 2012-07-13 NOTE — Progress Notes (Signed)
Patient ID: Derek Frye, male   DOB: 12/04/1940, 72 y.o.   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 He presented to the hospital today directly from Dr. Nilsa Frye office. He went there for evaluation of right lower extremity edema and redness that he first noticed about 48 hours prior to this admission. It has gotten progressively worse and is now involving the right knee. We have been asked to admit him for further evaluation and management. Please note the patient denies fever, chills, other systemic concerns.   Principal Problem:  Right lower extremity cellulitis, septic knee arthritis  - Patient clinically improving, he was started on vancomycin and Zosyn, patient remains afebrile, white blood cell count within normal limits this am  - spoke with ID and based on preliminary report with g- rods, recommendation was to change ABX to Rocephin and Flagyl for total three weeks therapy   - Continue supportive care with analgesia as needed, keep extremity elevated  - plan for I&D today  Active Problems:  Diarrhea  - negative C. Diff by PCR Acute renal failure  - now creatinine within normal limits, continue Lasix  - BMP in AM  Diabetes mellitus type 2  - A1c 6.2 (07/07/2012), continue sliding scale insulin while inpatient, good control while inpatient  - Patient will be able to continue metformin upon discharge as his renal function is stable  Anemia of chronic disease  - Hemoglobin overall stable, no current indication for transfusion  - CBC in AM  DYSLIPIDEMIA  - Stable  medical issue, continue simvastatin  AAA  - Stable clinical issue  HTN (hypertension)  - reasonable inpatient control  - continue spironolactone  CAD (coronary artery disease)  - stable medical issue  Atrial fibrillation  - rate controlled, continue coumadin per pharmacy after I&D   Consultants:  Ortho Procedures/Studies:  Right knee aspiration 05/02 00 --> fluid analysis pending  Antibiotics:  Vancomycin 04/29 --> 5/5 Zosyn 04/29 --> 5/5 Rocephin 5/5 --> Flagyl 5/5 -->  Code Status: Full  Family Communication: Pt at bedside  Disposition Plan: Not ready for discharge   HPI/Subjective: No events overnight.   Objective: Filed Vitals:   07/12/12 1506 07/12/12 2143 07/13/12 0552 07/13/12 1436  BP: 151/73 136/58 124/51 121/48  Pulse: 57 99 51 48  Temp: 98.4 F (36.9 C) 98.1 F (36.7 C) 98 F (36.7 C) 98.7 F (37.1 C)  TempSrc: Oral Oral Oral Oral  Resp: 16 16 16 18   Height:      Weight:      SpO2: 97% 94% 96% 96%    Intake/Output Summary (Last 24 hours) at 07/13/12 1440 Last data filed at 07/13/12 1223  Gross per 24 hour  Intake   1180 ml  Output   1550 ml  Net   -370 ml    Exam:   General:  Pt is alert, follows commands appropriately, not in acute distress  Cardiovascular: Regular rate and rhythm, S1/S2, no murmurs, no rubs, no gallops  Respiratory: Clear to auscultation bilaterally, no wheezing, no crackles, no rhonchi  Abdomen: Soft, non tender, non distended, bowel sounds present, no guarding  Extremities: No edema, pulses DP and PT  palpable bilaterally  Neuro: Grossly nonfocal  Data Reviewed: Basic Metabolic Panel:  Recent Labs Lab 07/08/12 0345 07/09/12 0400 07/10/12 0355 07/12/12 0410 07/13/12 0405  NA 134* 135 132* 135 136  K 3.9 3.5 3.7 3.8 3.6  CL 97 98 98 99 98  CO2 28 27 25 27 30   GLUCOSE 170* 167* 174* 153* 117*  BUN 33* 38* 29* 24* 22  CREATININE 1.12 1.40* 1.11 1.02 1.11  CALCIUM 8.5 8.4 8.1* 8.7 8.7   Liver Function  Tests:  Recent Labs Lab 07/07/12 1500  AST 19  ALT 13  ALKPHOS 114  BILITOT 1.3*  PROT 7.9  ALBUMIN 3.4*   CBC:  Recent Labs Lab 07/07/12 1500 07/08/12 0345 07/09/12 0400 07/10/12 0355 07/12/12 0410 07/13/12 0405  WBC 11.9* 8.6 7.1 8.3 9.8 8.5  NEUTROABS 9.3*  --   --   --   --   --   HGB 11.4* 10.6* 9.9* 9.5* 9.6* 9.4*  HCT 34.1* 32.1* 29.7* 28.1* 30.2* 29.1*  MCV 87.9 85.4 87.6 83.4 85.8 86.6  PLT 155 133* 124* 137* 170 192   CBG:  Recent Labs Lab 07/12/12 1130 07/12/12 1705 07/12/12 2142 07/13/12 0731 07/13/12 1138  GLUCAP 170* 199* 218* 109* 131*    Recent Results (from the past 240 hour(s))  CULTURE, BLOOD (ROUTINE X 2)     Status: None   Collection Time    07/07/12  6:50 PM      Result Value Range Status   Specimen Description BLOOD RIGHT ARM   Final   Special Requests BOTTLES DRAWN AEROBIC AND ANAEROBIC 10CC   Final   Culture  Setup Time 07/08/2012 03:19   Final   Culture     Final   Value:        BLOOD CULTURE RECEIVED NO GROWTH TO DATE CULTURE WILL BE HELD FOR 5 DAYS BEFORE ISSUING A FINAL NEGATIVE REPORT   Report Status PENDING   Incomplete  CULTURE, BLOOD (ROUTINE X 2)     Status: None   Collection Time    07/07/12  7:00 PM      Result Value Range Status   Specimen Description BLOOD RIGHT ARM   Final   Special Requests BOTTLES DRAWN AEROBIC AND ANAEROBIC 4CC   Final   Culture  Setup Time 07/07/2012 23:59   Final   Culture     Final   Value:        BLOOD CULTURE RECEIVED NO GROWTH TO DATE CULTURE WILL BE HELD FOR 5 DAYS BEFORE ISSUING A FINAL NEGATIVE REPORT   Report Status PENDING   Incomplete  BODY FLUID CULTURE     Status: None   Collection Time    07/10/12 10:50 AM      Result Value Range Status   Specimen Description SYNOVIAL KNEE   Final   Special Requests NONE   Final   Gram Stain     Final   Value: CYTOSPIN WBC PRESENT,BOTH PMN AND MONONUCLEAR     NO ORGANISMS SEEN     Gram Stain Report Called to,Read Back By and Verified With:  Gram Stain Report Called to,Read Back By and Verified With: Derek Cline RN AT 1155 ON 05.02.14 BY SHUEA Performed by [facility]   Culture     Final   Value: RARE PSEUDOMONAS AERUGINOSA     Note: CRITICAL RESULT CALLED TO, READ BACK BY AND VERIFIED WITH: Derek Frye 07/12/12 @ 12:53PM BY RUSCA.   Report Status 07/13/2012 FINAL  Final   Organism ID, Bacteria PSEUDOMONAS AERUGINOSA   Final  GRAM STAIN     Status: None   Collection Time    07/10/12 10:50 AM      Result Value Range Status   Specimen Description SYNOVIAL KNEE   Final   Special Requests NONE   Final   Gram Stain     Final   Value: CYTOSPIN     WBC PRESENT,BOTH PMN AND MONONUCLEAR     NO ORGANISMS SEEN     Gram Stain Report Called to,Read Back By and Verified With: Derek Cline RN AT 1155 ON 05.02.14 BY SHUEA   Report Status 07/10/2012 FINAL   Final  CLOSTRIDIUM DIFFICILE BY PCR     Status: None   Collection Time    07/11/12  6:28 PM      Result Value Range Status   C difficile by pcr NEGATIVE  NEGATIVE Final  SURGICAL PCR SCREEN     Status: None   Collection Time    07/12/12  7:59 AM      Result Value Range Status   MRSA, PCR NEGATIVE  NEGATIVE Final   Staphylococcus aureus NEGATIVE  NEGATIVE Final   Comment:            The Xpert SA Assay (FDA     approved for NASAL specimens     in patients over 73 years of age),     is one component of     a comprehensive surveillance     program.  Test performance has     been validated by [employer] for patients greater     than or equal to 28 year old.     It is not intended     to diagnose infection nor to     guide or monitor treatment.    Scheduled Meds: . cefTRIAXone (ROCEPHIN)  IV  1 g Intravenous Q24H  . furosemide  40 mg Oral QAC supper  . gabapentin  100 mg Oral TID  . insulin aspart  0-15 Units Subcutaneous TID WC  . insulin aspart  0-5 Units Subcutaneous QHS  . insulin aspart  7 Units Subcutaneous TID WC  . magnesium gluconate  500 mg Oral Daily   . metolazone  2.5 mg Oral Daily  . metronidazole  500 mg Intravenous Q8H  . potassium chloride SA  40 mEq Oral QHS  . simvastatin  40 mg Oral QHS  . sodium chloride  3 mL Intravenous Q12H  . spironolactone  25 mg Oral BID   Continuous Infusions:   Debbora Presto, MD  [facility] Pager [phone]  If 7PM-7AM, please contact night-coverage [email or web address] Password [email or web address] 07/13/2012, 2:40 PM   LOS: 6 days

## 2012-07-13 NOTE — Anesthesia Postprocedure Evaluation (Signed)
  Anesthesia Post-op Note  Patient: Derek Frye  Procedure(s) Performed: Procedure(s) (LRB): IRRIGATION AND DEBRIDEMENT KNEE WITH POLY EXCHANGE (Right)  Patient Location: PACU  Anesthesia Type: General  Level of Consciousness: awake and alert   Airway and Oxygen Therapy: Patient Spontanous Breathing  Post-op Pain: mild  Post-op Assessment: Post-op Vital signs reviewed, Patient's Cardiovascular Status Stable, Respiratory Function Stable, Patent Airway and No signs of Nausea or vomiting  Last Vitals:  Filed Vitals:   07/13/12 2215  BP: 128/59  Pulse: 61  Temp: 36.7 C  Resp: 21    Post-op Vital Signs: stable   Complications: No apparent anesthesia complications

## 2012-07-13 NOTE — Transfer of Care (Signed)
Immediate Anesthesia Transfer of Care Note  Patient: Derek Frye  Procedure(s) Performed: Procedure(s): IRRIGATION AND DEBRIDEMENT KNEE WITH POLY EXCHANGE (Right)  Patient Location: PACU  Anesthesia Type:General  Level of Consciousness: awake, alert , oriented and patient cooperative  Airway & Oxygen Therapy: Patient Spontanous Breathing and Patient connected to face mask oxygen  Post-op Assessment: Report given to PACU RN and Post -op Vital signs reviewed and stable  Post vital signs: Reviewed and stable  Complications: No apparent anesthesia complications

## 2012-07-13 NOTE — Progress Notes (Signed)
Patient ID: Derek Frye, male   DOB: 22-Apr-1940, 72 y.o.   MRN: [id number]  Septic right total knee replacement in setting of venous insufficiency and cellulitis  NPO now for OR today  Plan is to try and salvage his joint as he already has had a left AKA  Understands risks and intent of surgery, he is hungry and ready to move forward Consent order on chart

## 2012-07-14 ENCOUNTER — Encounter (HOSPITAL_COMMUNITY): Payer: Self-pay | Admitting: Orthopedic Surgery

## 2012-07-14 ENCOUNTER — Inpatient Hospital Stay (HOSPITAL_COMMUNITY): Payer: Medicare Other

## 2012-07-14 LAB — CULTURE, BLOOD (ROUTINE X 2)
Culture: NO GROWTH
Culture: NO GROWTH

## 2012-07-14 LAB — BASIC METABOLIC PANEL
Calcium: 8.6 mg/dL (ref 8.4–10.5)
Creatinine, Ser: 1.05 mg/dL (ref 0.50–1.35)
GFR calc non Af Amer: 68 mL/min — ABNORMAL LOW (ref >90)
Glucose, Bld: 202 mg/dL — ABNORMAL HIGH (ref 70–99)
Sodium: 136 mEq/L (ref 135–145)

## 2012-07-14 LAB — CBC
MCH: 28.3 pg (ref 26.0–34.0)
Platelets: 214 10*3/uL (ref 150–400)
RBC: 2.97 MIL/uL — ABNORMAL LOW (ref 4.22–5.81)
RDW: 15.9 % — ABNORMAL HIGH (ref 11.5–15.5)

## 2012-07-14 LAB — GLUCOSE, CAPILLARY: Glucose-Capillary: 171 mg/dL — ABNORMAL HIGH (ref 70–99)

## 2012-07-14 LAB — PROTIME-INR: INR: 1.42 (ref 0.00–1.49)

## 2012-07-14 MED ORDER — SODIUM CHLORIDE 0.9 % IJ SOLN
10.0000 mL | Freq: Two times a day (BID) | INTRAMUSCULAR | Status: DC
  Administered 2012-07-14 – 2012-07-17 (×4): 10 mL

## 2012-07-14 MED ORDER — WARFARIN SODIUM 3 MG PO TABS
3.0000 mg | ORAL_TABLET | Freq: Once | ORAL | Status: AC
  Administered 2012-07-14: 3 mg via ORAL
  Filled 2012-07-14: qty 1

## 2012-07-14 MED ORDER — SODIUM CHLORIDE 0.9 % IJ SOLN
10.0000 mL | INTRAMUSCULAR | Status: DC | PRN
  Administered 2012-07-14 – 2012-07-15 (×2): 10 mL

## 2012-07-14 NOTE — Progress Notes (Signed)
ANTICOAGULATION CONSULT NOTE - Initial Consult  Pharmacy Consult for warfarin Indication: atrial fibrillation  Allergies  Allergen Reactions  . Amoxicillin     REACTION: swelling and a rash  . Morphine Nausea And Vomiting  . Vancomycin     Pt c/o of feeling flushed and red, arm red around IV site    Patient Measurements: Height: 5\' 11"  (180.3 cm) Weight: 220 lb (99.791 kg) IBW/kg (Calculated) : 75.3 Heparin Dosing Weight:   Vital Signs: Temp: 97.6 F (36.4 C) (05/05 2229) Temp src: Oral (05/05 1436) BP: 123/69 mmHg (05/05 2229) Pulse Rate: 65 (05/05 2229)  Labs:  Recent Labs  07/11/12 0427 07/12/12 0410 07/13/12 0405  HGB  --  9.6* 9.4*  HCT  --  30.2* 29.1*  PLT  --  170 192  LABPROT 18.5* 16.4* 16.9*  INR 1.59* 1.35 1.41  CREATININE  --  1.02 1.11    Estimated Creatinine Clearance: 71.3 ml/min (by C-G formula based on Cr of 1.11).   Medical History: Past Medical History  Diagnosis Date  . HTN (hypertension)   . Obesity   . History of cholecystectomy   . CAD (coronary artery disease)     Status post CABG followed by redo in 1994 / nuclear 2007, old infarct, no ischemia  . Ejection fraction < 50%     EF  45%...echo 2004 /  EF 45%, echo, August, 2012  . Hyperlipidemia   . Cigarette smoker     He is now smoking cigars and needs to quit.  . 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 no replacement until leg is stable  . Prostate cancer     History of prostate cancer with a seed implant and this is stable.  . Abdominal aortic aneurysm     is stable, being followed  . Atrial fibrillation     Rate control / going in and out of atrial fib, Coumadin  . Volume overload   . Right bundle branch block     noted November 09, 2008.  . Warfarin anticoagulation     Atrial fibrillation  . Carotid artery disease     Doppler September 2 009, 40-50% LICA / Doppler October, 2010, 0-39% R. ICA 40-59% LICA  . Diabetes mellitus   . Myocardial  infarction 1984  . Ulcer   . Peripheral vascular disease   . 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 Heart murmur   . Arthritis     Medications:  Prescriptions prior to admission  Medication Sig Dispense Refill  . docusate sodium (COLACE) 100 MG capsule Take 100-200 mg by mouth daily as needed. For constipation      . furosemide (LASIX) 40 MG tablet Take 40 mg by mouth every evening. Take one (1) tablet by mouth daily.      Marland Kitchen gabapentin (NEURONTIN) 100 MG capsule Take 100 mg by mouth 3 (three) times daily as needed. For phantom pain      . magnesium gluconate (MAGONATE) 500 MG tablet Take 500 mg by mouth daily.       . metFORMIN (GLUCOPHAGE) 850 MG tablet Take 850 mg by mouth 2 (two) times daily with a meal.       . metolazone (ZAROXOLYN) 2.5 MG tablet Take 2.5 mg by mouth daily.       . potassium chloride SA (K-DUR,KLOR-CON) 20 MEQ tablet Take 40 mEq by mouth every evening.       Marland Kitchen  simvastatin (ZOCOR) 40 MG tablet Take 1 tablet (40 mg total) by mouth at bedtime.  90 tablet  3  . spironolactone (ALDACTONE) 25 MG tablet Take 25 mg by mouth 2 (two) times daily. Take one (1) tablet by mouth two (2) times a day.      . warfarin (COUMADIN) 2.5 MG tablet Take 1.25-2.5 mg by mouth every evening. Takes 1.25 mg on sun, tue, thu, fri and 2.5 mg all other days        Assessment: Patient with chronic warfarin for Afib.  Warfarin to be restarted after procedure. Goal of Therapy:  INR 2-3    Plan:  Warfarin 3mg  po x1 now Daily INR  Aleene Davidson Crowford 07/14/2012,2:07 AM

## 2012-07-14 NOTE — Op Note (Signed)
NAME:  Derek Frye, Derek Frye NO.:  [id number]  MEDICAL RECORD NO.:  [id number]  LOCATION:  1316                         FACILITY:  [facility]  PHYSICIAN:  Madlyn Frankel. Charlann Boxer, M.D.  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  PROCEDURE: 1. Sharp excisional debridement of right knee including skin and     subcutaneous tissue, bone and synovium . 2. Non excisional debridement of right knee wound with 9 L of normal     saline solution in 1 L of Betadine diluted solution with normal     saline.  Please note, at the excision segment, there was an     incision that was approximately 12 inches long. 3. Polyethylene exchange utilizing an [employer] polyethylene     insert for Scorpio knee, 11 x 12 mm.  A size 11 for 12 mm insert.  SURGEON:  Madlyn Frankel. Charlann Boxer, M.D.  ASSISTANT:  Lanney Gins, PA-C.  Note that [name] was present for the entirety of the case from preoperative position, perioperative management, operative extremity, general facilitation of the case and wound closure assistance.  ANESTHESIA:  General.  SPECIMENS:  None taken, as the patient had previous aspiration prior to the significant antibiotics.  DRAINS:  One medium Hemovac.  ESTIMATED BLOOD LOSS:  Probably about 300 mL.  TOURNIQUET TIME:  Only 6 minutes at 250 mmHg.  Based on the overriding tourniquet and his underlying vascular disease, I did not want to complicate features further.  FINDINGS:  The patient was noted to have significant infection of the right knee,  most likely was creeping up to the proximal and medial aspect of the knee at the medial retinacular tissue was basically dysfunctional and nonviable.  INDICATIONS FOR PROCEDURE:  [name] is a 72 year old patient well into me from previous left knee infection, related to a similar condition of his lower extremity with chronic ulcerations and chronic wounds.  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  Unfortunately, [name] presented to the office for evaluation of increasing pain, swelling, and erythema in his right knee.  He was admitted to the hospital and placed on IV antibiotics for cellulitis. We ultimately aspirated his knee and found that he had greater than 30,000 white blood cells in his knee aspirate.  This herein lies a significant dilemma for [name].  After my review him my plan at this point, based on our experience with his left knee further stressed salvage of this joint, thus the plan for an excision and debridement with polyethylene exchange.  Given the discussion of the potential known risks of further surgery including amputation, he and his wife wished to proceed in this fashion. Consent was obtained.  PROCEDURE IN DETAIL:  The patient was brought into operative theater. Once adequate anesthesia, preoperative antibiotics already being measured administered to a therapeutic level including Rocephin as well as Flagyl, he was positioned supine.  The right thigh tourniquet was placed.  The right lower extremity was pre-scrubbed, prepped and draped in the sterile fashion.  A time-out was performed identifying the patient, planned procedure, and extremity.  Leg was exsanguinated, tourniquet elevated to 250 mmHg.  At this point, the skin incision was excised just at the borders of the scar, nothing more significant, soft tissue planes created.  Following an arthrotomy encounter and  significant purulent fluid as well as this proximal medial retinacular tissue that appeared to be nonviable, we spent a significant portion the case sharply excising the synovium and any nonviable tissue on the medial aspect and the lateral aspect of the suprapatellar pouch.  With this, included the proximal medial exposure.  After initial debridement sharply with a scalpel, I did irrigate the knee with 3 L of normal saline solution.  I had removed the polyethylene insert prior to this.  Following this first 3 L of pulse lavage normal saline, we irrigated the knee with a diluted Betadine solution maintaining this in the knee joint for approximately 5 minutes and then removing.  We then irrigated the knee again with 3 L normal saline solution.  During and prior to this time, further debridement was carried out as the nonviable tissue present itself.  Based on the implanted polyethylene that was removed, we selected the same insert.  It was opened on the back table.  We all changed our surgical gloves and then implanted the new insert.  This was snapped in without difficulty.  Following this, we irrigated the knee with a third bag of 3 L normal saline solution.  Following this, non excisional debridement on top of the excision debridement already performed.  We placed a medium Hemovac drain deep.  The extensor mechanism was then reapproximated with #1 PDS. The extensor mechanism was probed, reapproximated proximally down to the distal area.  In the proximal medial tibia, there was no real tissue to reapproximate just below the joint line.  This again will represent a major problem for [name]' knee.  Based on the cellulitic and venous stasis changes of his proximal leg, I did choose to use 2-0 nylon on this area to try to reapproximate the skin edges.  Despite with only minimal skin incision, I found that the tension on the skin down here was not optimal, but I was able to  reapproximate the skin by approaching it from both proximal and distal directions.  The proximal portion of the incision was a vast majority incision, was reapproximated with 2-0 Vicryl and staples on the skin.  Once the skin was closed to the best of our ability, we cleaned the knee up and dressed it with a Xeroform and a bulky sterile wrap.  The patient was then brought to recovery room in stable condition with the ice wrap and a knee immobilizer in place.  Findings will be reviewed with his wife pending final cultures from the infectious disease consultation regarding chronic long-term use.  He will need to be on suppressive antibiotics for the rest of his life to prevent complications, this may not be his last operation including further I and D's, attempts at better skin closure, and the possibility of amputation down the road.     Madlyn Frankel Charlann Boxer, M.D.     MDO/MEDQ  D:  07/13/2012  T:  07/14/2012  Job:  [id number]

## 2012-07-14 NOTE — Progress Notes (Signed)
Patient ID: Derek Frye, male   DOB: 22-Oct-1940, 72 y.o.   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 He presented to the hospital today directly from Dr. Nilsa Nutting office. He went there for evaluation of right lower extremity edema and redness that he first noticed about 48 hours prior to this admission. It has gotten progressively worse and is now involving the right knee. We have been asked to admit him for further evaluation and management. Please note the patient denies fever, chills, other systemic concerns.   Principal Problem:  Right lower extremity cellulitis, septic knee arthritis  - Patient is now status post I&D post op day #1 and doing well clinically - continue ABX Rocephin and Flagyl - Continue supportive care with analgesia as needed, keep extremity elevated  Active Problems:  Diarrhea  - negative C. Diff by PCR, diarrhea now resolved   Acute renal failure  - now creatinine within normal limits, continue Lasix  - BMP in AM  Diabetes mellitus type 2  - A1c 6.2 (07/07/2012), continue sliding scale insulin while inpatient, good control while inpatient  - Patient will be able to continue metformin upon discharge as his renal function is stable  Anemia of chronic disease  - Hemoglobin slightly down from yesterday but overall stable, no current indication for transfusion  - CBC in AM  DYSLIPIDEMIA  - Stable medical issue, continue simvastatin  AAA  - Stable clinical issue  HTN (hypertension)  - reasonable inpatient control  - continue spironolactone  CAD  (coronary artery disease)  - stable medical issue  Atrial fibrillation  - rate controlled, resume coumadin per pharmacy  Consultants:  Ortho Procedures/Studies:  Right knee aspiration 05/02 00 --> fluid analysis pending  07/13/2012 --> Sharp excisional debridement of right knee including skin and subcutaneous tissue, bone and synovium .  Antibiotics:  Vancomycin 04/29 --> 5/5  Zosyn 04/29 --> 5/5  Rocephin 5/5 -->  Flagyl 5/5 -->  Code Status: Full  Family Communication: Pt at bedside  Disposition Plan: Not ready for discharge   HPI/Subjective: No events overnight.   Objective: Filed Vitals:   07/13/12 2200 07/13/12 2215 07/13/12 2229 07/14/12 0541  BP: 134/63 128/59 123/69 118/57  Pulse: 65 61 65 68  Temp: 98 F (36.7 C) 98 F (36.7 C) 97.6 F (36.4 C) 98.3 F (36.8 C)  TempSrc:    Oral  Resp: 13 21 20 18   Height:      Weight:      SpO2: 98% 99% 94% 93%    Intake/Output Summary (Last 24 hours) at 07/14/12 2952 Last data filed at 07/14/12 0559  Gross per 24 hour  Intake   1810 ml  Output   1185 ml  Net    625 ml    Exam:   General:  Pt is alert, follows commands appropriately, not in acute distress  Cardiovascular: Irregular rate and rhythm, no murmurs, no rubs, no gallops  Respiratory: Clear to auscultation bilaterally, no wheezing, no crackles, no rhonchi  Abdomen: Soft, non tender, non distended, bowel sounds present, no guarding  Extremities: Right lower extremity edema and erythema improved significantly, less warmth to touch, right knee  still tender to palpation and with erythema  Neuro: Grossly nonfocal  Data Reviewed: Basic Metabolic Panel:  Recent Labs Lab 07/09/12 0400 07/10/12 0355 07/12/12 0410 07/13/12 0405 07/14/12 0500  NA 135 132* 135 136 136  K 3.5 3.7 3.8 3.6 3.9  CL 98 98 99 98 98  CO2 27 25 27 30 31   GLUCOSE 167* 174* 153* 117* 202*  BUN 38* 29* 24* 22 23  CREATININE 1.40* 1.11 1.02 1.11 1.05  CALCIUM 8.4 8.1* 8.7 8.7  8.6   Liver Function Tests:  Recent Labs Lab 07/07/12 1500  AST 19  ALT 13  ALKPHOS 114  BILITOT 1.3*  PROT 7.9  ALBUMIN 3.4*   CBC:  Recent Labs Lab 07/07/12 1500  07/09/12 0400 07/10/12 0355 07/12/12 0410 07/13/12 0405 07/14/12 0500  WBC 11.9*  < > 7.1 8.3 9.8 8.5 10.0  NEUTROABS 9.3*  --   --   --   --   --   --   HGB 11.4*  < > 9.9* 9.5* 9.6* 9.4* 8.4*  HCT 34.1*  < > 29.7* 28.1* 30.2* 29.1* 26.1*  MCV 87.9  < > 87.6 83.4 85.8 86.6 87.9  PLT 155  < > 124* 137* 170 192 214  < > = values in this interval not displayed.  CBG:  Recent Labs Lab 07/13/12 0731 07/13/12 1138 07/13/12 1616 07/13/12 2145 07/14/12 0737  GLUCAP 109* 131* 113* 102* 171*    Recent Results (from the past 240 hour(s))  CULTURE, BLOOD (ROUTINE X 2)     Status: None   Collection Time    07/07/12  6:50 PM      Result Value Range Status   Specimen Description BLOOD RIGHT ARM   Final   Special Requests BOTTLES DRAWN AEROBIC AND ANAEROBIC 10CC   Final   Culture  Setup Time 07/08/2012 03:19   Final   Culture NO GROWTH 5 DAYS   Final   Report Status 07/14/2012 FINAL   Final  CULTURE, BLOOD (ROUTINE X 2)     Status: None   Collection Time    07/07/12  7:00 PM      Result Value Range Status   Specimen Description BLOOD RIGHT ARM   Final   Special Requests BOTTLES DRAWN AEROBIC AND ANAEROBIC 4CC   Final   Culture  Setup Time 07/07/2012 23:59   Final   Culture NO GROWTH 5 DAYS   Final   Report Status 07/14/2012 FINAL   Final  BODY FLUID CULTURE     Status: None   Collection Time    07/10/12 10:50 AM      Result Value Range Status   Specimen Description SYNOVIAL KNEE   Final   Special Requests NONE   Final   Gram Stain     Final   Value: CYTOSPIN WBC PRESENT,BOTH PMN AND MONONUCLEAR     NO ORGANISMS SEEN     Gram Stain Report Called to,Read Back By and Verified With: Gram Stain Report Called to,Read Back By and Verified With: Liliana Cline RN AT 1155 ON 05.02.14 BY SHUEA Performed by [facility]   Culture     Final   Value: RARE PSEUDOMONAS AERUGINOSA     Note: CRITICAL RESULT CALLED TO, READ BACK BY AND VERIFIED WITH: CHRISTY Birdsall 07/12/12 @ 12:53PM BY RUSCA.   Report Status 07/13/2012 FINAL   Final   Organism ID, Bacteria PSEUDOMONAS AERUGINOSA   Final  GRAM STAIN     Status: None  Collection Time    07/10/12 10:50 AM      Result Value Range Status   Specimen Description SYNOVIAL KNEE   Final   Special Requests NONE   Final   Gram Stain     Final   Value: CYTOSPIN     WBC PRESENT,BOTH PMN AND MONONUCLEAR     NO ORGANISMS SEEN     Gram Stain Report Called to,Read Back By and Verified With: Liliana Cline RN AT 1155 ON 05.02.14 BY SHUEA   Report Status 07/10/2012 FINAL   Final  CLOSTRIDIUM DIFFICILE BY PCR     Status: None   Collection Time    07/11/12  6:28 PM      Result Value Range Status   C difficile by pcr NEGATIVE  NEGATIVE Final  SURGICAL PCR SCREEN     Status: None   Collection Time    07/12/12  7:59 AM      Result Value Range Status   MRSA, PCR NEGATIVE  NEGATIVE Final   Staphylococcus aureus NEGATIVE  NEGATIVE Final   Comment:            The Xpert SA Assay (FDA     approved for NASAL specimens     in patients over 61 years of age),     is one component of     a comprehensive surveillance     program.  Test performance has     been validated by [employer] for patients greater     than or equal to 56 year old.     It is not intended     to diagnose infection nor to     guide or monitor treatment.     Scheduled Meds: . cefTRIAXone (ROCEPHIN)  IV  1 g Intravenous Q24H  . furosemide  40 mg Oral QAC supper  . gabapentin  100 mg Oral TID  . insulin aspart  0-15 Units Subcutaneous TID WC  . insulin aspart  0-5 Units Subcutaneous QHS  . insulin aspart  7 Units Subcutaneous TID WC  . magnesium gluconate  500 mg Oral Daily  . metolazone  2.5 mg Oral Daily  . metronidazole  500 mg Intravenous Q8H  . potassium chloride SA  40 mEq Oral QHS   . simvastatin  40 mg Oral QHS  . sodium chloride  3 mL Intravenous Q12H  . spironolactone  25 mg Oral BID  . warfarin  3 mg Oral ONCE-1800  . Warfarin - Pharmacist Dosing Inpatient   Does not apply q1800   Continuous Infusions:   Debbora Presto, MD  [facility] Pager [phone]  If 7PM-7AM, please contact night-coverage [email or web address] Password [email or web address] 07/14/2012, 9:27 AM   LOS: 7 days

## 2012-07-14 NOTE — Progress Notes (Signed)
ANTICOAGULATION CONSULT NOTE - Follow Up  Pharmacy Consult for warfarin Indication: atrial fibrillation  Allergies  Allergen Reactions  . Amoxicillin     REACTION: swelling and a rash  . Morphine Nausea And Vomiting  . Vancomycin     Pt c/o of feeling flushed and red, arm red around IV site    Patient Measurements: Height: 5\' 11"  (180.3 cm) Weight: 220 lb (99.791 kg) IBW/kg (Calculated) : 75.3 Heparin Dosing Weight:   Vital Signs: Temp: 98.3 F (36.8 C) (05/06 0541) Temp src: Oral (05/06 0541) BP: 118/57 mmHg (05/06 0541) Pulse Rate: 68 (05/06 0541)  Labs:  Recent Labs  07/12/12 0410 07/13/12 0405 07/14/12 0412 07/14/12 0500  HGB 9.6* 9.4*  --  8.4*  HCT 30.2* 29.1*  --  26.1*  PLT 170 192  --  214  LABPROT 16.4* 16.9* 17.0*  --   INR 1.35 1.41 1.42  --   CREATININE 1.02 1.11  --  1.05    Estimated Creatinine Clearance: 75.4 ml/min (by C-G formula based on Cr of 1.05).   Medical History: Past Medical History  Diagnosis Date  . HTN (hypertension)   . Obesity   . History of cholecystectomy   . CAD (coronary artery disease)     Status post CABG followed by redo in 1994 / nuclear 2007, old infarct, no ischemia  . Ejection fraction < 50%     EF  45%...echo 2004 /  EF 45%, echo, August, 2012  . Hyperlipidemia   . Cigarette smoker     He is now smoking cigars and needs to quit.  . 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 no replacement until leg is stable  . Prostate cancer     History of prostate cancer with a seed implant and this is stable.  . Abdominal aortic aneurysm     is stable, being followed  . Atrial fibrillation     Rate control / going in and out of atrial fib, Coumadin  . Volume overload   . Right bundle branch block     noted November 09, 2008.  . Warfarin anticoagulation     Atrial fibrillation  . Carotid artery disease     Doppler September 2 009, 40-50% LICA / Doppler October, 2010, 0-39% R. ICA 40-59% LICA   . Diabetes mellitus   . Myocardial infarction 1984  . Ulcer   . Peripheral vascular disease   . 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 Heart murmur   . Arthritis     Medications:  Prescriptions prior to admission  Medication Sig Dispense Refill  . docusate sodium (COLACE) 100 MG capsule Take 100-200 mg by mouth daily as needed. For constipation      . furosemide (LASIX) 40 MG tablet Take 40 mg by mouth every evening. Take one (1) tablet by mouth daily.      Marland Kitchen gabapentin (NEURONTIN) 100 MG capsule Take 100 mg by mouth 3 (three) times daily as needed. For phantom pain      . magnesium gluconate (MAGONATE) 500 MG tablet Take 500 mg by mouth daily.       . metFORMIN (GLUCOPHAGE) 850 MG tablet Take 850 mg by mouth 2 (two) times daily with a meal.       . metolazone (ZAROXOLYN) 2.5 MG tablet Take 2.5 mg by mouth daily.       . potassium chloride SA (K-DUR,KLOR-CON) 20 MEQ  tablet Take 40 mEq by mouth every evening.       . simvastatin (ZOCOR) 40 MG tablet Take 1 tablet (40 mg total) by mouth at bedtime.  90 tablet  3  . spironolactone (ALDACTONE) 25 MG tablet Take 25 mg by mouth 2 (two) times daily. Take one (1) tablet by mouth two (2) times a day.      . warfarin (COUMADIN) 2.5 MG tablet Take 1.25-2.5 mg by mouth every evening. Takes 1.25 mg on sun, tue, thu, fri and 2.5 mg all other days        Assessment: 71 YOM on chronic coumadin for hx Afib. Has been on hold for planned I and D of infected RTKR. Pt is now s/p surgery, coumadin resumed 5/5 PM. Dose PTA 1.25mg  on Sun, Tues, Thurs, and Friday; 2.5mg  other days  INR is subtx, 3mg  last night  No bleeding repoted  Goal of Therapy:  INR 2-3   Plan:  Coumadin 3mg  tonight Daily INR  Gwen Her PharmD  [phone] 07/14/2012 8:42 AM

## 2012-07-14 NOTE — Evaluation (Signed)
Physical Therapy Evaluation Patient Details Name: Derek Frye MRN: [id number] DOB: May 16, 1940 Today's Date: 07/14/2012 Time:  -     PT Assessment / Plan / Recommendation Clinical Impression  Pt is 72 y.o. with h/o L AKA 2* infected TKA, now admitted with infected R TKA and is s/p I&D of R knee and polyethylene liner exchange of TKA. Attempted sit to stand with RW, pt unable to tolerate standing 2* pain. Will need SNF upon acute DC. Pt would benefit from acute PT to maximize safety and independence with mobility.     PT Assessment  Patient needs continued PT services    Follow Up Recommendations  SNF;Supervision/Assistance - 24 hour    Does the patient have the potential to tolerate intense rehabilitation      Barriers to Discharge Decreased caregiver support wife works, will need SNF    Equipment Recommendations  None recommended by PT    Recommendations for Other Services OT consult   Frequency Min 3X/week    Precautions / Restrictions Precautions Precautions: Fall Required Braces or Orthoses: Knee Immobilizer - Right Restrictions Weight Bearing Restrictions: No   Pertinent Vitals/Pain *10/10 R knee, RN notified. RLE elevated**      Mobility  Bed Mobility Bed Mobility: Rolling Right;Rolling Left;Supine to Sit;Scooting to [facility] Rolling Right: With rail;2: Max assist Rolling Left: 2: Max assist Supine to Sit: 1: +2 Total assist;HOB elevated Supine to Sit: Patient Percentage: 40% Scooting to HOB: With trapeze;1: +2 Total assist Scooting to [facility]: Patient Percentage: 40% Details for Bed Mobility Assistance: assist and VCs for hand placement for bed mobility, assist to elevate trunk and support RLE with supine to sit Transfers Transfers: Sit to Stand;Stand to Sit Sit to Stand: 1: +2 Total assist;From bed;With upper extremity assist;From elevated surface Sit to Stand: Patient Percentage: 40% Stand to Sit: 1: +2 Total assist;To bed;To elevated surface Stand to Sit:  Patient Percentage: 60% Details for Transfer Assistance: bed elevated, pt wearing R KI, +2 assist to stand with RW, unable to tolerate standing 2* pain RLE, pt stood 5 seconds Ambulation/Gait Ambulation/Gait Assistance: Not tested (comment)    Exercises     PT Diagnosis: Difficulty walking;Generalized weakness;Acute pain  PT Problem List: Decreased strength;Decreased activity tolerance;Decreased mobility;Obesity;Pain;Impaired sensation PT Treatment Interventions: DME instruction;Functional mobility training;Therapeutic activities;Therapeutic exercise;Patient/family education   PT Goals Acute Rehab PT Goals PT Goal Formulation: With patient Time For Goal Achievement: 07/28/12 Potential to Achieve Goals: Fair Pt will go Supine/Side to Sit: with mod assist PT Goal: Supine/Side to Sit - Progress: Goal set today Pt will go Sit to Stand: with max assist PT Goal: Sit to Stand - Progress: Goal set today Pt will go Stand to Sit: with max assist PT Goal: Stand to Sit - Progress: Goal set today Pt will Transfer Bed to Chair/Chair to Bed: with max assist PT Transfer Goal: Bed to Chair/Chair to Bed - Progress: Goal set today  Visit Information       Subjective Data  Subjective: I could get into my Wheelchair real good.  Patient Stated Goal: be able to transfer to [facility]   Prior Functioning  Home Living Lives With: Spouse Available Help at Discharge: Available PRN/intermittently (wife works) Type of Home: House Home Access: Ramped entrance Home Layout: One level Bathroom Shower/Tub: Health visitor: Handicapped height Bathroom Accessibility: Yes Home Adaptive Equipment: Wheelchair - manual;Grab bars in shower;Grab bars around toilet;Walker - rolling Additional Comments: pt has L AKA, states he has a prothesis at home but hasn't  walked in about a month since having aneurysm surgery Prior Function Level of Independence: Independent with assistive device(s) Driving: No (not  since aneurysm operation a month ago) Comments: pt states he could independently transfer to [facility] and independently bath/dress Communication Communication: No difficulties    Cognition  Cognition Arousal/Alertness: Awake/alert Behavior During Therapy: WFL for tasks assessed/performed Overall Cognitive Status: Within Functional Limits for tasks assessed    Extremity/Trunk Assessment Right Upper Extremity Assessment RUE ROM/Strength/Tone: Within functional levels RUE Coordination: WFL - gross/fine motor Left Upper Extremity Assessment LUE ROM/Strength/Tone: Within functional levels LUE Sensation: WFL - Light Touch LUE Coordination: WFL - gross/fine motor Right Lower Extremity Assessment RLE ROM/Strength/Tone: Deficits;Unable to fully assess RLE ROM/Strength/Tone Deficits: R hip flexion -3/5, ankle WNL; knee NT 2* pain RLE Sensation: Deficits;History of peripheral neuropathy RLE Sensation Deficits: pt stated foot is "numb all the time" but stated he could feel light touch to R foot Left Lower Extremity Assessment LLE ROM/Strength/Tone: [facility] for tasks assessed (L AKA) LLE Sensation: WFL - Light Touch LLE Coordination: WFL - gross/fine motor Trunk Assessment Trunk Assessment: Normal   Balance Balance Balance Assessed: Yes Static Sitting Balance Static Sitting - Balance Support: Bilateral upper extremity supported (R foot supported) Static Sitting - Level of Assistance: 5: Stand by assistance Static Sitting - Comment/# of Minutes: 5 minutes  End of Session PT - End of Session Equipment Utilized During Treatment: Gait belt Activity Tolerance: Patient limited by pain;Patient limited by fatigue Patient left: in bed;with call bell/phone within reach Nurse Communication: Mobility status  GP     Ralene Bathe Kistler 07/14/2012, 1:47 PM [phone]

## 2012-07-14 NOTE — Progress Notes (Signed)
   Subjective: 1 Day Post-Op Procedure(s) (LRB): IRRIGATION AND DEBRIDEMENT KNEE WITH POLY EXCHANGE (Right)   Patient reports pain as mild. States that he needs some pain medicine, but he is falling a sleep in the middle of the conversation.  Objective:   VITALS:   Filed Vitals:   07/14/12 0541  BP: 118/57  Pulse: 68  Temp: 98.3 F (36.8 C)  Resp: 18    Incision: dressing C/D/I  LABS  Recent Labs  07/12/12 0410 07/13/12 0405 07/14/12 0500  HGB 9.6* 9.4* 8.4*  HCT 30.2* 29.1* 26.1*  WBC 9.8 8.5 10.0  PLT 170 192 214     Recent Labs  07/12/12 0410 07/13/12 0405 07/14/12 0500  NA 135 136 136  K 3.8 3.6 3.9  BUN 24* 22 23  CREATININE 1.02 1.11 1.05  GLUCOSE 153* 117* 202*     Assessment/Plan: 1 Day Post-Op Procedure(s) (LRB): IRRIGATION AND DEBRIDEMENT KNEE WITH POLY EXCHANGE (Right)  Knee immobilizer on at all times Up with therapy  transfers Discharge home with home health eventually   Anastasio Auerbach. Fredy Gladu   PAC  07/14/2012, 11:59 AM

## 2012-07-14 NOTE — Progress Notes (Signed)
[facility]  Patient Status: New admission to [facility] for home IV ABX.  [facility] is providing the following services:  Home Health Nursing and home infusion pharmacy.  [facility] Infusion Coordinator will initiate in hospital IV ABX teaching to support smooth transition to home and independence with IV ABX.  If patient discharges after hours, please call [phone].   Sedalia Muta 07/14/2012, 1:28 PM

## 2012-07-14 NOTE — Care Management Note (Signed)
Cm spoke with patient concerning discharge planning with spouse present at bedside. Per pt disposition includes dc home with [facility] services. Per Md note pt will require IV ABX. Per pt choice [facility] to provide [facility] services at discharge. [facility]en notified of new referral. Pt's spouse to assist in home care. No other needs assessed at this time.     Roxy Manns Holdan Stucke,RN,BSN [phone]

## 2012-07-14 NOTE — Progress Notes (Signed)
Peripherally Inserted Central Catheter/Midline Placement  The IV Nurse has discussed with the patient and/or persons authorized to consent for the patient, the purpose of this procedure and the potential benefits and risks involved with this procedure.  The benefits include less needle sticks, lab draws from the catheter and patient may be discharged home with the catheter.  Risks include, but not limited to, infection, bleeding, blood clot (thrombus formation), and puncture of an artery; nerve damage and irregular heat beat.  Alternatives to this procedure were also discussed.  PICC/Midline Placement Documentation        Lisabeth Devoid 07/14/2012, 2:47 PM

## 2012-07-15 DIAGNOSIS — Y831 Surgical operation with implant of artificial internal device as the cause of abnormal reaction of the patient, or of later complication, without mention of misadventure at the time of the procedure: Secondary | ICD-10-CM

## 2012-07-15 DIAGNOSIS — B965 Pseudomonas (aeruginosa) (mallei) (pseudomallei) as the cause of diseases classified elsewhere: Secondary | ICD-10-CM

## 2012-07-15 DIAGNOSIS — T8450XA Infection and inflammatory reaction due to unspecified internal joint prosthesis, initial encounter: Principal | ICD-10-CM

## 2012-07-15 LAB — CBC
HCT: 26.8 % — ABNORMAL LOW (ref 39.0–52.0)
MCH: 28 pg (ref 26.0–34.0)
MCHC: 32.1 g/dL (ref 30.0–36.0)
MCV: 87.3 fL (ref 78.0–100.0)
Platelets: 227 10*3/uL (ref 150–400)
RDW: 16 % — ABNORMAL HIGH (ref 11.5–15.5)
WBC: 9.1 10*3/uL (ref 4.0–10.5)

## 2012-07-15 LAB — GLUCOSE, CAPILLARY
Glucose-Capillary: 175 mg/dL — ABNORMAL HIGH (ref 70–99)
Glucose-Capillary: 157 mg/dL — ABNORMAL HIGH (ref 70–99)
Glucose-Capillary: 153 mg/dL — ABNORMAL HIGH (ref 70–99)

## 2012-07-15 LAB — BASIC METABOLIC PANEL
BUN: 19 mg/dL (ref 6–23)
CO2: 33 mEq/L — ABNORMAL HIGH (ref 19–32)
Calcium: 8.8 mg/dL (ref 8.4–10.5)
Chloride: 95 mEq/L — ABNORMAL LOW (ref 96–112)
Creatinine, Ser: 1 mg/dL (ref 0.50–1.35)
Glucose, Bld: 134 mg/dL — ABNORMAL HIGH (ref 70–99)

## 2012-07-15 LAB — PROTIME-INR: INR: 1.54 — ABNORMAL HIGH (ref 0.00–1.49)

## 2012-07-15 MED ORDER — WARFARIN SODIUM 3 MG PO TABS
3.0000 mg | ORAL_TABLET | Freq: Once | ORAL | Status: AC
  Administered 2012-07-15: 3 mg via ORAL
  Filled 2012-07-15: qty 1

## 2012-07-15 MED ORDER — DEXTROSE 5 % IV SOLN
2.0000 g | Freq: Two times a day (BID) | INTRAVENOUS | Status: DC
  Administered 2012-07-15 – 2012-07-17 (×5): 2 g via INTRAVENOUS
  Filled 2012-07-15 (×6): qty 2

## 2012-07-15 NOTE — Consult Note (Addendum)
[facility]     Reason for Consult: PJI s/p polyexchange    Referring Physician: Dr. Charlann Boxer  Principal Problem:   Cellulitis and abscess of lower leg Active Problems:   DYSLIPIDEMIA   AAA   PERIPHERAL VASCULAR DISEASE   HTN (hypertension)   CAD (coronary artery disease)   Atrial fibrillation   Warfarin anticoagulation   . ceFEPime (MAXIPIME) IV  2 g Intravenous Q12H  . furosemide  40 mg Oral QAC supper  . gabapentin  100 mg Oral TID  . insulin aspart  0-15 Units Subcutaneous TID WC  . insulin aspart  0-5 Units Subcutaneous QHS  . insulin aspart  7 Units Subcutaneous TID WC  . magnesium gluconate  500 mg Oral Daily  . metolazone  2.5 mg Oral Daily  . metronidazole  500 mg Intravenous Q8H  . potassium chloride SA  40 mEq Oral QHS  . simvastatin  40 mg Oral QHS  . sodium chloride  10-40 mL Intracatheter Q12H  . sodium chloride  3 mL Intravenous Q12H  . spironolactone  25 mg Oral BID  . warfarin  3 mg Oral ONCE-1800  . 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  Typically in a patient with previous amputation, polyexchange with significant comorbidities such as him, I would consider oral suppression following a complete course of IV antibiotics.  With this organism however, there are no options.  Certainly it will be prudent to the best of our ability to assure resolution of the infection as we do with inflammatory markers and clinical exam and continue IV antibiotics until that is achieved.  That has been discussed with the patient and his wife.     Antibiotics: Vancomycin and zosyn 4/29-5/5 Cefepime day 1  HPI: Derek Frye is a  72 y.o. male with CAD, AAA with stents, vascular insufficiency and a history of bilateral knee replacement and left AKA following PJI in October 2012 who came in 4/29 with redness and swelling of right knee of about 2 days duration.  Knee was aspirated on 5/2 and has now grown Pseudomonas, fluoroquinolone resistant.  He underwent polyexchange 5/5 and plan has been to try to salvage knee, possibly with suppressive antibiotics following IV treatment.  At this time he has no fever or chills, and did not prior to presentation.  He has a history of wound swab and left knee synovial fluid with group B Streptococcus, Pseudomonas and panresistant Stenotrophamonas.     Review of Systems: Pertinent items are noted in HPI.  Past Medical History  Diagnosis Date  . HTN (hypertension)   . Obesity   . History of cholecystectomy   . CAD (coronary artery disease)     Status post CABG followed by redo in 1994 / nuclear 2007, old infarct, no ischemia  . Ejection fraction < 50%     EF  45%...echo 2004 /  EF 45%, echo, August, 2012  . Hyperlipidemia   . Cigarette smoker     He is now smoking cigars and needs to quit.  . 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 no replacement until leg is  stable  . Prostate cancer     History of prostate cancer with a seed implant and this is stable.  . Abdominal aortic aneurysm     is stable, being followed  . Atrial fibrillation     Rate control / going in and out of atrial fib, Coumadin  . Volume overload   . Right bundle branch block     noted November 09, 2008.  . Warfarin anticoagulation     Atrial fibrillation  . Carotid artery disease     Doppler September 2 009, 40-50% LICA / Doppler October, 2010, 0-39% R. ICA 40-59% LICA  . Diabetes mellitus   . Myocardial infarction 1984  . Ulcer   . Peripheral vascular disease   . 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 Heart murmur   . Arthritis      History  Substance Use Topics  . Smoking status: Current Some Day Smoker -- 0.50 packs/day for 30 years    Types: Cigars, Cigarettes  . Smokeless tobacco: Never Used  . Alcohol Use: No    Family History  Problem Relation Age of Onset  . Stroke Father 29  . Diabetes Other    Allergies  Allergen Reactions  . Amoxicillin     REACTION: swelling and a rash  . Morphine Nausea And Vomiting  . Vancomycin     Pt c/o of feeling flushed and red, arm red around IV site    OBJECTIVE: Blood pressure 117/54, pulse 65, temperature 98.7 F (37.1 C), temperature source Oral, resp. rate 15, height 5\' 11"  (1.803 m), weight 220 lb (99.791 kg), SpO2 93.00%. General: AAO x 3, nad Skin: chronic venous stasis findings on right leg Lungs: CTA B Cor: RRR without m/r/g Abdomen: soft, nt, nd, +bs, no HSM Ext: leg wrapped, right picc  Microbiology: Recent Results (from the past 240 hour(s))  CULTURE, BLOOD (ROUTINE X 2)     Status: None   Collection Time    07/07/12  6:50 PM      Result Value Range Status   Specimen Description BLOOD RIGHT ARM   Final   Special Requests BOTTLES DRAWN AEROBIC AND ANAEROBIC 10CC   Final   Culture  Setup Time 07/08/2012 03:19   Final   Culture NO GROWTH 5 DAYS   Final   Report Status 07/14/2012 FINAL   Final  CULTURE, BLOOD (ROUTINE X 2)     Status: None   Collection Time    07/07/12  7:00 PM      Result Value Range Status   Specimen Description BLOOD RIGHT ARM   Final   Special Requests BOTTLES DRAWN AEROBIC AND ANAEROBIC 4CC   Final   Culture  Setup Time 07/07/2012 23:59   Final   Culture NO GROWTH 5 DAYS   Final   Report Status 07/14/2012 FINAL   Final  BODY FLUID CULTURE     Status: None   Collection Time    07/10/12 10:50 AM      Result Value Range Status   Specimen Description SYNOVIAL KNEE   Final   Special Requests NONE   Final   Gram Stain     Final   Value: CYTOSPIN WBC PRESENT,BOTH PMN AND MONONUCLEAR     NO ORGANISMS SEEN     Gram Stain  Report Called to,Read Back By and Verified With: Gram Stain Report Called to,Read Back By and Verified With: Liliana Cline RN AT 1155 ON  05.02.14 BY SHUEA Performed by [facility]   Culture     Final   Value: RARE PSEUDOMONAS AERUGINOSA     Note: CRITICAL RESULT CALLED TO, READ BACK BY AND VERIFIED WITH: CHRISTY Chesney 07/12/12 @ 12:53PM BY RUSCA.   Report Status 07/13/2012 FINAL   Final   Organism ID, Bacteria PSEUDOMONAS AERUGINOSA   Final  GRAM STAIN     Status: None   Collection Time    07/10/12 10:50 AM      Result Value Range Status   Specimen Description SYNOVIAL KNEE   Final   Special Requests NONE   Final   Gram Stain     Final   Value: CYTOSPIN     WBC PRESENT,BOTH PMN AND MONONUCLEAR     NO ORGANISMS SEEN     Gram Stain Report Called to,Read Back By and Verified With: Liliana Cline RN AT 1155 ON 05.02.14 BY SHUEA   Report Status 07/10/2012 FINAL   Final  CLOSTRIDIUM DIFFICILE BY PCR     Status: None   Collection Time    07/11/12  6:28 PM      Result Value Range Status   C difficile by pcr NEGATIVE  NEGATIVE Final  SURGICAL PCR SCREEN     Status: None   Collection Time    07/12/12  7:59 AM      Result Value Range Status   MRSA, PCR NEGATIVE  NEGATIVE Final   Staphylococcus aureus NEGATIVE  NEGATIVE Final   Comment:            The Xpert SA Assay (FDA     approved for NASAL specimens     in patients over 30 years of age),     is one component of     a comprehensive surveillance     program.  Test performance has     been validated by [employer] for patients greater     than or equal to 49 year old.     It is not intended     to diagnose infection nor to     guide or monitor treatment.    Staci Righter, MD [facility] [phone] pager  [phone] cell 07/15/2012, 12:03 PM

## 2012-07-15 NOTE — Progress Notes (Signed)
Patient ID: Derek Frye, male   DOB: 01-02-1941, 72 y.o.   MRN: [id number]  [facility] PROGRESS NOTE  Derek Frye WUJ:[id number] DOB: 03-25-1940 DOA: 07/07/2012 PCP: Pamelia Hoit, MD  Brief narrative:  Patient is a pleasant 72 year old obese white man with multiple medical problems including coronary artery disease, AAA with recent stent placement, history of bilateral knee replacements that terminated with 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 He presented to the hospital today directly from Dr. Nilsa Nutting office. He went there for evaluation of right lower extremity edema and redness that he first noticed about 48 hours prior to this admission. It has gotten progressively worse and is now involving the right knee. We have been asked to admit him for further evaluation and management. Please note the patient denies fever, chills, other systemic concerns.   Principal Problem:  Right lower extremity cellulitis, septic knee arthritis  - Patient is now status post I&D post op day #2 and doing well clinically  - ABX changed to Cefepime and will need to continue for 6 weeks per ID recommendations  - he will need weekly CBC with diff, CMP to [facility]  - Continue supportive care with analgesia as needed, keep extremity elevated  Active Problems:  Diarrhea  - negative C. Diff by PCR, diarrhea now resolved  Acute renal failure  - now creatinine within normal limits, continue Lasix  - BMP in AM  Diabetes mellitus type 2  - A1c 6.2 (07/07/2012), continue sliding scale insulin while inpatient, good control while inpatient  - Patient will be able to continue metformin upon discharge as his renal function is stable  Anemia of chronic disease  - Hemoglobin slightly down from yesterday but overall stable, no current indication for transfusion  - CBC in AM  DYSLIPIDEMIA  - Stable medical issue, continue simvastatin  AAA  - Stable  clinical issue  HTN (hypertension)  - reasonable inpatient control  - continue spironolactone  CAD (coronary artery disease)  - stable medical issue  Atrial fibrillation  - rate controlled, coumadin per pharmacy   Consultants:  Ortho ID Procedures/Studies:  Right knee aspiration 05/02 00 --> fluid analysis pending  07/13/2012 --> Sharp excisional debridement of right knee including skin and subcutaneous tissue, bone and synovium .  Antibiotics:  Vancomycin 04/29 --> 5/5  Zosyn 04/29 --> 5/5  Rocephin 5/5 --> 5/7 Flagyl 5/5 --> 5/7 Cefepime 5/7 --> for 6 weeks post discharge   Code Status: Full  Family Communication: Pt at bedside  Disposition Plan: Ready for d/c once cleared by ortho, PICC line placed   HPI/Subjective: No events overnight.   Objective: Filed Vitals:   07/14/12 2128 07/15/12 0446 07/15/12 1313 07/15/12 1327  BP: 129/47 117/54 125/63 128/70  Pulse: 68 65 64 84  Temp: 99.3 F (37.4 C) 98.7 F (37.1 C) 98.6 F (37 C) 98.4 F (36.9 C)  TempSrc: Oral Oral    Resp: 18 15 16 18   Height:      Weight:      SpO2: 97% 93% 94% 96%    Intake/Output Summary (Last 24 hours) at 07/15/12 1718 Last data filed at 07/15/12 1300  Gross per 24 hour  Intake    770 ml  Output   3505 ml  Net  -2735 ml    Exam:   General:  Pt is alert, follows commands appropriately, not in acute distress  Cardiovascular: Irregular rate and rhythm, no murmurs, no rubs, no gallops  Respiratory: Clear to auscultation bilaterally, no wheezing, no crackles, no rhonchi  Abdomen: Soft, non tender, non distended, bowel sounds present, no guarding  Extremities: Right knee still swollen and slightly tender to palpation, chronic venous stasis change in right lower extremity   Neuro: Grossly nonfocal  Data Reviewed: Basic Metabolic Panel:  Recent Labs Lab 07/10/12 0355 07/12/12 0410 07/13/12 0405 07/14/12 0500 07/15/12 0440  NA 132* 135 136 136 134*  K 3.7 3.8 3.6 3.9 3.9   CL 98 99 98 98 95*  CO2 25 27 30 31  33*  GLUCOSE 174* 153* 117* 202* 134*  BUN 29* 24* 22 23 19   CREATININE 1.11 1.02 1.11 1.05 1.00  CALCIUM 8.1* 8.7 8.7 8.6 8.8   CBC:  Recent Labs Lab 07/10/12 0355 07/12/12 0410 07/13/12 0405 07/14/12 0500 07/15/12 0440  WBC 8.3 9.8 8.5 10.0 9.1  HGB 9.5* 9.6* 9.4* 8.4* 8.6*  HCT 28.1* 30.2* 29.1* 26.1* 26.8*  MCV 83.4 85.8 86.6 87.9 87.3  PLT 137* 170 192 214 227   CBG:  Recent Labs Lab 07/14/12 1717 07/14/12 2151 07/15/12 0740 07/15/12 1144 07/15/12 1655  GLUCAP 148* 142* 125* 175* 157*    Recent Results (from the past 240 hour(s))  CULTURE, BLOOD (ROUTINE X 2)     Status: None   Collection Time    07/07/12  6:50 PM      Result Value Range Status   Specimen Description BLOOD RIGHT ARM   Final   Special Requests BOTTLES DRAWN AEROBIC AND ANAEROBIC 10CC   Final   Culture  Setup Time 07/08/2012 03:19   Final   Culture NO GROWTH 5 DAYS   Final   Report Status 07/14/2012 FINAL   Final  CULTURE, BLOOD (ROUTINE X 2)     Status: None   Collection Time    07/07/12  7:00 PM      Result Value Range Status   Specimen Description BLOOD RIGHT ARM   Final   Special Requests BOTTLES DRAWN AEROBIC AND ANAEROBIC 4CC   Final   Culture  Setup Time 07/07/2012 23:59   Final   Culture NO GROWTH 5 DAYS   Final   Report Status 07/14/2012 FINAL   Final  BODY FLUID CULTURE     Status: None   Collection Time    07/10/12 10:50 AM      Result Value Range Status   Specimen Description SYNOVIAL KNEE   Final   Special Requests NONE   Final   Gram Stain     Final   Value: CYTOSPIN WBC PRESENT,BOTH PMN AND MONONUCLEAR     NO ORGANISMS SEEN     Gram Stain Report Called to,Read Back By and Verified With: Gram Stain Report Called to,Read Back By and Verified With: Liliana Cline RN AT 1155 ON 05.02.14 BY SHUEA Performed by [facility]   Culture     Final   Value: RARE PSEUDOMONAS AERUGINOSA     Note: CRITICAL RESULT CALLED TO, READ BACK BY AND  VERIFIED WITH: CHRISTY Frerking 07/12/12 @ 12:53PM BY RUSCA.   Report Status 07/13/2012 FINAL   Final   Organism ID, Bacteria PSEUDOMONAS AERUGINOSA   Final  GRAM STAIN     Status: None   Collection Time    07/10/12 10:50 AM      Result Value Range Status   Specimen Description SYNOVIAL KNEE   Final   Special Requests NONE   Final   Gram Stain     Final  Value: CYTOSPIN     WBC PRESENT,BOTH PMN AND MONONUCLEAR     NO ORGANISMS SEEN     Gram Stain Report Called to,Read Back By and Verified With: Liliana Cline RN AT 1155 ON 05.02.14 BY SHUEA   Report Status 07/10/2012 FINAL   Final  CLOSTRIDIUM DIFFICILE BY PCR     Status: None   Collection Time    07/11/12  6:28 PM      Result Value Range Status   C difficile by pcr NEGATIVE  NEGATIVE Final  SURGICAL PCR SCREEN     Status: None   Collection Time    07/12/12  7:59 AM      Result Value Range Status   MRSA, PCR NEGATIVE  NEGATIVE Final   Staphylococcus aureus NEGATIVE  NEGATIVE Final   Comment:            The Xpert SA Assay (FDA     approved for NASAL specimens     in patients over 82 years of age),     is one component of     a comprehensive surveillance     program.  Test performance has     been validated by [employer] for patients greater     than or equal to 68 year old.     It is not intended     to diagnose infection nor to     guide or monitor treatment.     Scheduled Meds: . ceFEPime (MAXIPIME) IV  2 g Intravenous Q12H  . furosemide  40 mg Oral QAC supper  . gabapentin  100 mg Oral TID  . insulin aspart  0-15 Units Subcutaneous TID WC  . insulin aspart  0-5 Units Subcutaneous QHS  . insulin aspart  7 Units Subcutaneous TID WC  . magnesium gluconate  500 mg Oral Daily  . metolazone  2.5 mg Oral Daily  . metronidazole  500 mg Intravenous Q8H  . potassium chloride SA  40 mEq Oral QHS  . simvastatin  40 mg Oral QHS  . sodium chloride  10-40 mL Intracatheter Q12H  . sodium chloride  3 mL Intravenous Q12H  .  spironolactone  25 mg Oral BID  . Warfarin - Pharmacist Dosing Inpatient   Does not apply q1800   Continuous Infusions:   Debbora Presto, MD  [facility] Pager [phone]  If 7PM-7AM, please contact night-coverage [email or web address] Password [email or web address] 07/15/2012, 5:18 PM   LOS: 8 days

## 2012-07-15 NOTE — Progress Notes (Signed)
ANTIBIOTIC CONSULT NOTE - INITIAL  Pharmacy Consult for Cefepime Indication: Prosthetic joint infx  Allergies  Allergen Reactions  . Amoxicillin     REACTION: swelling and a rash  . Morphine Nausea And Vomiting  . Vancomycin     Pt c/o of feeling flushed and red, arm red around IV site    Patient Measurements: Height: 5\' 11"  (180.3 cm) Weight: 220 lb (99.791 kg) IBW/kg (Calculated) : 75.3  Vital Signs: Temp: 98.7 F (37.1 C) (05/07 0446) Temp src: Oral (05/07 0446) BP: 117/54 mmHg (05/07 0446) Pulse Rate: 65 (05/07 0446) Intake/Output from previous day: 05/06 0701 - 05/07 0700 In: 970 [P.O.:960; I.V.:10] Out: 3360 [Urine:3325; Drains:35] Intake/Output from this shift:    Labs:  Recent Labs  07/13/12 0405 07/14/12 0500 07/15/12 0440  WBC 8.5 10.0 9.1  HGB 9.4* 8.4* 8.6*  PLT 192 214 227  CREATININE 1.11 1.05 1.00   Estimated Creatinine Clearance: 79.2 ml/min (by C-G formula based on Cr of 1).    Microbiology: 4/29 Blood x 2: NG F 5/2 R Knee aspirate: pseudomonas  5/3 C.diff (-) 5/4 mrsa and s.aureus pcr (-) x2  Medical History: Past Medical History  Diagnosis Date  . HTN (hypertension)   . Obesity   . History of cholecystectomy   . CAD (coronary artery disease)     Status post CABG followed by redo in 1994 / nuclear 2007, old infarct, no ischemia  . Ejection fraction < 50%     EF  45%...echo 2004 /  EF 45%, echo, August, 2012  . Hyperlipidemia   . Cigarette smoker     He is now smoking cigars and needs to quit.  . 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 no replacement until leg is stable  . Prostate cancer     History of prostate cancer with a seed implant and this is stable.  . Abdominal aortic aneurysm     is stable, being followed  . Atrial fibrillation     Rate control / going in and out of atrial fib, Coumadin  . Volume overload   . Right bundle branch block     noted November 09, 2008.  . Warfarin  anticoagulation     Atrial fibrillation  . Carotid artery disease     Doppler September 2 009, 40-50% LICA / Doppler October, 2010, 0-39% R. ICA 40-59% LICA  . Diabetes mellitus   . Myocardial infarction 1984  . Ulcer   . Peripheral vascular disease   . 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 Heart murmur   . Arthritis     Medications:  Anti-infectives   Start     Dose/Rate Route Frequency Ordered Stop   07/15/12 1000  ceFEPIme (MAXIPIME) 2 g in dextrose 5 % 50 mL IVPB     2 g 100 mL/hr over 30 Minutes Intravenous Every 12 hours 07/15/12 0817     07/13/12 1600  cefTRIAXone (ROCEPHIN) 1 g in dextrose 5 % 50 mL IVPB  Status:  Discontinued     1 g 100 mL/hr over 30 Minutes Intravenous Every 24 hours 07/13/12 1138 07/15/12 0815   07/13/12 1400  metroNIDAZOLE (FLAGYL) IVPB 500 mg     500 mg 100 mL/hr over 60 Minutes Intravenous 3 times per day 07/13/12 1138     07/10/12 2200  vancomycin (VANCOCIN) 1,250 mg in sodium chloride 0.9 % 250 mL IVPB  Status:  Discontinued     1,250 mg 166.7 mL/hr over 90 Minutes Intravenous Every 12 hours 07/10/12 1241 07/13/12 1135   07/09/12 1300  vancomycin (VANCOCIN) IVPB 750 mg/150 ml premix  Status:  Discontinued     750 mg 150 mL/hr over 60 Minutes Intravenous Every 12 hours 07/09/12 1115 07/10/12 1241   07/07/12 2200  vancomycin (VANCOCIN) IVPB 1000 mg/200 mL premix  Status:  Discontinued     1,000 mg 200 mL/hr over 60 Minutes Intravenous Every 12 hours 07/07/12 2110 07/09/12 1115   07/07/12 2200  piperacillin-tazobactam (ZOSYN) IVPB 3.375 g  Status:  Discontinued     3.375 g 12.5 mL/hr over 240 Minutes Intravenous Every 8 hours 07/07/12 2110 07/13/12 1135   07/07/12 1900  clindamycin (CLEOCIN) IVPB 600 mg     600 mg 100 mL/hr over 30 Minutes Intravenous  Once 07/07/12 1851 07/07/12 1945     Assessment: 71 yom on D3 flagyl/rocephin for infected RTKR. Pt had aspiration of knee on 5/2 and I and D of knee  5/5  Synovial fluid cx = pseudomonas, d/c rocephin and start cefepime per pharmacy dosing  Scr wnl, crcl 79 ml/min  Afebrile, WBC wnl  Goal of Therapy:  Appropriate dose of Cefepime based on renal fx Resolution of infx  Plan:  Cefepime 2g IV q12h Follow Scr  Adjust dose as necessary  Gwen Her PharmD  [phone] 07/15/2012 8:31 AM

## 2012-07-15 NOTE — Progress Notes (Signed)
   Subjective: 2 Days Post-Op Procedure(s) (LRB): IRRIGATION AND DEBRIDEMENT KNEE WITH POLY EXCHANGE (Right)   Patient reports pain as mild, pain well controlled. States that the pain is better today than it was yesterday. Says that when PT got him up yesterday it was quite painful. No other events throughout the night.  Objective:   VITALS:   Filed Vitals:   07/15/12 0446  BP: 117/54  Pulse: 65  Temp: 98.7 F (37.1 C)  Resp: 15    Sensation intact distally Dorsiflexion/Plantar flexion intact Incision: dressing C/D/I  LABS  Recent Labs  07/13/12 0405 07/14/12 0500 07/15/12 0440  HGB 9.4* 8.4* 8.6*  HCT 29.1* 26.1* 26.8*  WBC 8.5 10.0 9.1  PLT 192 214 227     Recent Labs  07/13/12 0405 07/14/12 0500 07/15/12 0440  NA 136 136 134*  K 3.6 3.9 3.9  BUN 22 23 19   CREATININE 1.11 1.05 1.00  GLUCOSE 117* 202* 134*     Assessment/Plan: 2 Days Post-Op Procedure(s) (LRB): IRRIGATION AND DEBRIDEMENT KNEE WITH POLY EXCHANGE (Right)  HV drain d/c'ed ID consult was ordered , discussed with Dr. Luciana Axe Up with therapy Discharge home with home health eventually when ready   Anastasio Auerbach. Kegan Shepardson   PAC  07/15/2012, 11:21 AM

## 2012-07-15 NOTE — Progress Notes (Signed)
ANTICOAGULATION CONSULT NOTE - Follow Up  Pharmacy Consult for warfarin Indication: atrial fibrillation  Allergies  Allergen Reactions  . Amoxicillin     REACTION: swelling and a rash  . Morphine Nausea And Vomiting  . Vancomycin     Pt c/o of feeling flushed and red, arm red around IV site    Patient Measurements: Height: 5\' 11"  (180.3 cm) Weight: 220 lb (99.791 kg) IBW/kg (Calculated) : 75.3 Heparin Dosing Weight:   Vital Signs: Temp: 98.7 F (37.1 C) (05/07 0446) Temp src: Oral (05/07 0446) BP: 117/54 mmHg (05/07 0446) Pulse Rate: 65 (05/07 0446)  Labs:  Recent Labs  07/13/12 0405 07/14/12 0412 07/14/12 0500 07/15/12 0440  HGB 9.4*  --  8.4* 8.6*  HCT 29.1*  --  26.1* 26.8*  PLT 192  --  214 227  LABPROT 16.9* 17.0*  --  18.0*  INR 1.41 1.42  --  1.54*  CREATININE 1.11  --  1.05 1.00    Estimated Creatinine Clearance: 79.2 ml/min (by C-G formula based on Cr of 1).   Medical History: Past Medical History  Diagnosis Date  . HTN (hypertension)   . Obesity   . History of cholecystectomy   . CAD (coronary artery disease)     Status post CABG followed by redo in 1994 / nuclear 2007, old infarct, no ischemia  . Ejection fraction < 50%     EF  45%...echo 2004 /  EF 45%, echo, August, 2012  . Hyperlipidemia   . Cigarette smoker     He is now smoking cigars and needs to quit.  . 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 no replacement until leg is stable  . Prostate cancer     History of prostate cancer with a seed implant and this is stable.  . Abdominal aortic aneurysm     is stable, being followed  . Atrial fibrillation     Rate control / going in and out of atrial fib, Coumadin  . Volume overload   . Right bundle branch block     noted November 09, 2008.  . Warfarin anticoagulation     Atrial fibrillation  . Carotid artery disease     Doppler September 2 009, 40-50% LICA / Doppler October, 2010, 0-39% R. ICA 40-59% LICA  .  Diabetes mellitus   . Myocardial infarction 1984  . Ulcer   . Peripheral vascular disease   . 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 Heart murmur   . Arthritis     Medications:  Prescriptions prior to admission  Medication Sig Dispense Refill  . docusate sodium (COLACE) 100 MG capsule Take 100-200 mg by mouth daily as needed. For constipation      . furosemide (LASIX) 40 MG tablet Take 40 mg by mouth every evening. Take one (1) tablet by mouth daily.      Marland Kitchen gabapentin (NEURONTIN) 100 MG capsule Take 100 mg by mouth 3 (three) times daily as needed. For phantom pain      . magnesium gluconate (MAGONATE) 500 MG tablet Take 500 mg by mouth daily.       . metFORMIN (GLUCOPHAGE) 850 MG tablet Take 850 mg by mouth 2 (two) times daily with a meal.       . metolazone (ZAROXOLYN) 2.5 MG tablet Take 2.5 mg by mouth daily.       . potassium chloride SA (K-DUR,KLOR-CON) 20 MEQ  tablet Take 40 mEq by mouth every evening.       . simvastatin (ZOCOR) 40 MG tablet Take 1 tablet (40 mg total) by mouth at bedtime.  90 tablet  3  . spironolactone (ALDACTONE) 25 MG tablet Take 25 mg by mouth 2 (two) times daily. Take one (1) tablet by mouth two (2) times a day.      . warfarin (COUMADIN) 2.5 MG tablet Take 1.25-2.5 mg by mouth every evening. Takes 1.25 mg on sun, tue, thu, fri and 2.5 mg all other days        Assessment: 71 YOM on chronic coumadin for hx Afib. Has been on hold for planned I and D of infected RTKR. Pt is now s/p surgery, coumadin resumed 5/5 PM. Dose PTA 1.25mg  on Sun, Tues, Thurs, and Friday; 2.5mg  other days  INR is subtx, up some after 3mg  last 2 nights  H/H down, no bleeding repoted  Goal of Therapy:  INR 2-3   Plan:  Coumadin 3mg  tonight Daily INR  Gwen Her PharmD  [phone] 07/15/2012 7:53 AM

## 2012-07-16 LAB — CBC
HCT: 28.5 % — ABNORMAL LOW (ref 39.0–52.0)
MCHC: 32.3 g/dL (ref 30.0–36.0)
RDW: 16.1 % — ABNORMAL HIGH (ref 11.5–15.5)

## 2012-07-16 LAB — GLUCOSE, CAPILLARY: Glucose-Capillary: 112 mg/dL — ABNORMAL HIGH (ref 70–99)

## 2012-07-16 LAB — BASIC METABOLIC PANEL
BUN: 20 mg/dL (ref 6–23)
Calcium: 9.2 mg/dL (ref 8.4–10.5)
Creatinine, Ser: 0.96 mg/dL (ref 0.50–1.35)
GFR calc Af Amer: >90 mL/min (ref >90)
GFR calc non Af Amer: 80 mL/min — ABNORMAL LOW (ref >90)

## 2012-07-16 LAB — PROTIME-INR: Prothrombin Time: 18.9 seconds — ABNORMAL HIGH (ref 11.6–15.2)

## 2012-07-16 MED ORDER — TRAMADOL HCL 50 MG PO TABS
50.0000 mg | ORAL_TABLET | Freq: Four times a day (QID) | ORAL | Status: DC
  Administered 2012-07-16: 50 mg via ORAL
  Filled 2012-07-16 (×4): qty 2

## 2012-07-16 MED ORDER — WARFARIN SODIUM 3 MG PO TABS
3.0000 mg | ORAL_TABLET | Freq: Once | ORAL | Status: DC
  Filled 2012-07-16: qty 1

## 2012-07-16 NOTE — Progress Notes (Signed)
[facility]  Date of Admission:  07/07/2012  Antibiotics: Cefepime 2 grams q 12 hours day 2  Subjective: No complaints, no rashes, nofever, no chills  Objective: Temp:  [98 F (36.7 C)-99.4 F (37.4 C)] 98 F (36.7 C) (05/08 1335) Pulse Rate:  [64-66] 66 (05/08 1335) Resp:  [15-16] 16 (05/08 1335) BP: (110-125)/(43-70) 110/67 mmHg (05/08 1335) SpO2:  [94 %-96 %] 96 % (05/08 1335)  General: Awake, alert, nad Skin: no new rashes, right leg with venous stasis changes, incision with mild erythema on medial aspect but not warm, no discharge Lungs: CTA B Cor: RRR without m/r/g Abdomen: soft, nt, nd, +bs   Lab Results Lab Results  Component Value Date   WBC 11.6* 07/16/2012   HGB 9.2* 07/16/2012   HCT 28.5* 07/16/2012   MCV 85.6 07/16/2012   PLT 274 07/16/2012    Lab Results  Component Value Date   CREATININE 0.96 07/16/2012   BUN 20 07/16/2012   NA 136 07/16/2012   K 3.8 07/16/2012   CL 94* 07/16/2012   CO2 34* 07/16/2012    Lab Results  Component Value Date   ALT 13 07/07/2012   AST 19 07/07/2012   ALKPHOS 114 07/07/2012   BILITOT 1.3* 07/07/2012      Microbiology: Recent Results (from the past 240 hour(s))  CULTURE, BLOOD (ROUTINE X 2)     Status: None   Collection Time    07/07/12  6:50 PM      Result Value Range Status   Specimen Description BLOOD RIGHT ARM   Final   Special Requests BOTTLES DRAWN AEROBIC AND ANAEROBIC 10CC   Final   Culture  Setup Time 07/08/2012 03:19   Final   Culture NO GROWTH 5 DAYS   Final   Report Status 07/14/2012 FINAL   Final  CULTURE, BLOOD (ROUTINE X 2)     Status: None   Collection Time    07/07/12  7:00 PM      Result Value Range Status   Specimen Description BLOOD RIGHT ARM   Final   Special Requests BOTTLES DRAWN AEROBIC AND ANAEROBIC 4CC   Final   Culture  Setup Time 07/07/2012 23:59   Final   Culture NO GROWTH 5 DAYS   Final   Report Status 07/14/2012 FINAL   Final  BODY FLUID CULTURE     Status: None   Collection Time    07/10/12 10:50 AM      Result Value Range Status   Specimen Description SYNOVIAL KNEE   Final   Special Requests NONE   Final   Gram Stain     Final   Value: CYTOSPIN WBC PRESENT,BOTH PMN AND MONONUCLEAR     NO ORGANISMS SEEN     Gram Stain Report Called to,Read Back By and Verified With: Gram Stain Report Called to,Read Back By and Verified With: Liliana Cline RN AT 1155 ON 05.02.14 BY SHUEA Performed by [facility]   Culture     Final   Value: RARE PSEUDOMONAS AERUGINOSA     Note: CRITICAL RESULT CALLED TO, READ BACK BY AND VERIFIED WITH: CHRISTY Pontillo 07/12/12 @ 12:53PM BY RUSCA.   Report Status 07/13/2012 FINAL   Final   Organism ID, Bacteria PSEUDOMONAS AERUGINOSA   Final  GRAM STAIN     Status: None   Collection Time    07/10/12 10:50 AM      Result Value Range Status   Specimen Description SYNOVIAL KNEE  Final   Special Requests NONE   Final   Gram Stain     Final   Value: CYTOSPIN     WBC PRESENT,BOTH PMN AND MONONUCLEAR     NO ORGANISMS SEEN     Gram Stain Report Called to,Read Back By and Verified With: Liliana Cline RN AT 1155 ON 05.02.14 BY SHUEA   Report Status 07/10/2012 FINAL   Final  CLOSTRIDIUM DIFFICILE BY PCR     Status: None   Collection Time    07/11/12  6:28 PM      Result Value Range Status   C difficile by pcr NEGATIVE  NEGATIVE Final  SURGICAL PCR SCREEN     Status: None   Collection Time    07/12/12  7:59 AM      Result Value Range Status   MRSA, PCR NEGATIVE  NEGATIVE Final   Staphylococcus aureus NEGATIVE  NEGATIVE Final   Comment:            The Xpert SA Assay (FDA     approved for NASAL specimens     in patients over 70 years of age),     is one component of     a comprehensive surveillance     program.  Test performance has     been validated by [employer] for patients greater     than or equal to 59 year old.     It is not intended     to diagnose infection nor to     guide or monitor treatment.     Studies/Results: Dg Chest Port 1 View  07/14/2012  *RADIOLOGY REPORT*  Clinical Data: Confirm line placement.  PORTABLE CHEST - 1 VIEW  Comparison: One-view chest 04/30/2012.  Findings: Heart is enlarged.  Mild interstitial edema is evident. A right-sided PICC line projects over the mid SVC.  Degenerative changes are again noted in the shoulders.  IMPRESSION:  1.  The tip of the right-sided PICC line projects over the mid SVC. 2.  Cardiomegaly and mild interstitial edema.   Original Report Authenticated By: Marin Roberts, M.D.     Assessment/Plan: 1) PJI with polyexchange, Pseudomonas positive culture - on cefepime.  Typically would require oral suppressive antibiotics following completed course but no oral options.   -continue with cefepime for at least 6 weeks through June 18th -I will check initial CRP and ESR in am -I will arrange follow up with [facility] in about 2-3 weeks -he should get weekly CBC with diff, CMP to [facility] -do not pull picc line until seen in clinic -antibiotics at current dose per home health protocol  Call with any questions, Thanks  Staci Righter, MD [facility] [phone] pager   07/16/2012, 2:09 PM

## 2012-07-16 NOTE — Progress Notes (Signed)
Patient ID: Derek Frye, male   DOB: 1940/09/22, 72 y.o.   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 He presented to the hospital today directly from Dr. Nilsa Nutting office. He went there for evaluation of right lower extremity edema and redness that he first noticed about 48 hours prior to this admission. It has gotten progressively worse and is now involving the right knee. We have been asked to admit him for further evaluation and management. Please note the patient denies fever, chills, other systemic concerns.   Right lower extremity cellulitis, septic knee arthritis  - Patient is now status post I&D post op day #3 and doing well clinically  - ABX changed to Cefepime and will need to continue for 6 weeks per ID recommendations  - he will need weekly CBC with diff, CMP to [facility]  - Continue supportive care with analgesia as needed, keep extremity elevated   Diarrhea  - negative C. Diff by PCR, diarrhea now resolved   Acute renal failure  - now creatinine within normal limits, continue Lasix  - BMP in AM   Diabetes mellitus type 2  - A1c 6.2 (07/07/2012), continue sliding scale insulin while inpatient, good control while inpatient  - Patient will be able to continue metformin upon discharge as his renal function is stable   Anemia of chronic disease  - Hemoglobin slightly down from yesterday but overall stable, no current indication for transfusion  - CBC in AM   DYSLIPIDEMIA  - Stable medical issue, continue simvastatin   AAA  - Stable clinical issue   HTN  (hypertension)  - reasonable inpatient control  - continue spironolactone   CAD (coronary artery disease)  - stable medical issue   Atrial fibrillation  - rate controlled, coumadin per pharmacy   Consultants:  Ortho ID Procedures/Studies:  Right knee aspiration 05/02 00 --> fluid analysis pending  07/13/2012 --> Sharp excisional debridement of right knee including skin and subcutaneous tissue, bone and synovium .  Antibiotics:  Vancomycin 04/29 --> 5/5  Zosyn 04/29 --> 5/5  Rocephin 5/5 --> 5/7 Flagyl 5/5 --> 5/7 Cefepime 5/7 --> for 6 weeks post discharge   Code Status: Full  Family Communication: Pt at bedside  Disposition Plan: Ready for d/c once cleared by ortho, PICC line placed   HPI/Subjective: No events overnight.   Objective: Filed Vitals:   07/15/12 1313 07/15/12 1327 07/15/12 2118 07/16/12 0531  BP: 125/63 128/70 115/43 125/70  Pulse: 64 84 64 64  Temp: 98.6 F (37 C) 98.4 F (36.9 C) 99.4 F (37.4 C) 98.4 F (36.9 C)  TempSrc:   Oral Oral  Resp: 16 18 16 15   Height:      Weight:      SpO2: 94% 96% 94% 95%    Intake/Output Summary (Last 24 hours) at 07/16/12 1156 Last data filed at 07/16/12 0900  Gross per 24 hour  Intake   1200 ml  Output   1806 ml  Net   -606 ml   Exam:  General:  Pt is alert, follows commands appropriately, not in acute distress  Cardiovascular: Irregular rate and rhythm, no murmurs, no rubs, no gallops  Respiratory: Clear to auscultation bilaterally, no wheezing, no crackles, no rhonchi  Abdomen: Soft, non tender, non distended, bowel sounds present, no guarding  Extremities: Right knee still swollen and slightly tender to palpation, chronic venous stasis change in right lower extremity   Neuro: Grossly nonfocal  Data Reviewed: Basic Metabolic Panel:  Recent Labs Lab 07/12/12 0410 07/13/12 0405 07/14/12 0500 07/15/12 0440 07/16/12 0518  NA 135 136 136 134* 136  K 3.8 3.6 3.9 3.9 3.8  CL 99 98 98 95* 94*   CO2 27 30 31  33* 34*  GLUCOSE 153* 117* 202* 134* 135*  BUN 24* 22 23 19 20   CREATININE 1.02 1.11 1.05 1.00 0.96  CALCIUM 8.7 8.7 8.6 8.8 9.2   CBC:  Recent Labs Lab 07/12/12 0410 07/13/12 0405 07/14/12 0500 07/15/12 0440 07/16/12 0518  WBC 9.8 8.5 10.0 9.1 11.6*  HGB 9.6* 9.4* 8.4* 8.6* 9.2*  HCT 30.2* 29.1* 26.1* 26.8* 28.5*  MCV 85.8 86.6 87.9 87.3 85.6  PLT 170 192 214 227 274   CBG:  Recent Labs Lab 07/15/12 0740 07/15/12 1144 07/15/12 1655 07/15/12 2132 07/16/12 0725  GLUCAP 125* 175* 157* 153* 128*    Recent Results (from the past 240 hour(s))  CULTURE, BLOOD (ROUTINE X 2)     Status: None   Collection Time    07/07/12  6:50 PM      Result Value Range Status   Specimen Description BLOOD RIGHT ARM   Final   Special Requests BOTTLES DRAWN AEROBIC AND ANAEROBIC 10CC   Final   Culture  Setup Time 07/08/2012 03:19   Final   Culture NO GROWTH 5 DAYS   Final   Report Status 07/14/2012 FINAL   Final  CULTURE, BLOOD (ROUTINE X 2)     Status: None   Collection Time    07/07/12  7:00 PM      Result Value Range Status   Specimen Description BLOOD RIGHT ARM   Final   Special Requests BOTTLES DRAWN AEROBIC AND ANAEROBIC 4CC   Final   Culture  Setup Time 07/07/2012 23:59   Final   Culture NO GROWTH 5 DAYS   Final   Report Status 07/14/2012 FINAL   Final  BODY FLUID CULTURE     Status: None   Collection Time    07/10/12 10:50 AM      Result Value Range Status   Specimen Description SYNOVIAL KNEE   Final   Special Requests NONE   Final   Gram Stain     Final   Value: CYTOSPIN WBC PRESENT,BOTH PMN AND MONONUCLEAR     NO ORGANISMS SEEN     Gram Stain Report Called to,Read Back By and Verified With: Gram Stain Report Called to,Read Back By and Verified With: Liliana Cline RN AT 1155 ON 05.02.14 BY SHUEA Performed by [facility]   Culture     Final   Value: RARE PSEUDOMONAS AERUGINOSA     Note: CRITICAL RESULT CALLED TO, READ BACK BY AND VERIFIED WITH: CHRISTY  Wish 07/12/12 @ 12:53PM BY RUSCA.   Report Status 07/13/2012 FINAL   Final   Organism ID, Bacteria PSEUDOMONAS AERUGINOSA   Final  GRAM STAIN     Status: None   Collection Time    07/10/12 10:50 AM      Result Value Range Status   Specimen Description SYNOVIAL KNEE   Final   Special Requests NONE   Final   Gram Stain     Final  Value: CYTOSPIN     WBC PRESENT,BOTH PMN AND MONONUCLEAR     NO ORGANISMS SEEN     Gram Stain Report Called to,Read Back By and Verified With: Liliana Cline RN AT 1155 ON 05.02.14 BY SHUEA   Report Status 07/10/2012 FINAL   Final  CLOSTRIDIUM DIFFICILE BY PCR     Status: None   Collection Time    07/11/12  6:28 PM      Result Value Range Status   C difficile by pcr NEGATIVE  NEGATIVE Final  SURGICAL PCR SCREEN     Status: None   Collection Time    07/12/12  7:59 AM      Result Value Range Status   MRSA, PCR NEGATIVE  NEGATIVE Final   Staphylococcus aureus NEGATIVE  NEGATIVE Final   Comment:            The Xpert SA Assay (FDA     approved for NASAL specimens     in patients over 20 years of age),     is one component of     a comprehensive surveillance     program.  Test performance has     been validated by [employer] for patients greater     than or equal to 80 year old.     It is not intended     to diagnose infection nor to     guide or monitor treatment.     Scheduled Meds: . ceFEPime (MAXIPIME) IV  2 g Intravenous Q12H  . furosemide  40 mg Oral QAC supper  . gabapentin  100 mg Oral TID  . insulin aspart  0-15 Units Subcutaneous TID WC  . insulin aspart  0-5 Units Subcutaneous QHS  . insulin aspart  7 Units Subcutaneous TID WC  . magnesium gluconate  500 mg Oral Daily  . metolazone  2.5 mg Oral Daily  . potassium chloride SA  40 mEq Oral QHS  . simvastatin  40 mg Oral QHS  . sodium chloride  10-40 mL Intracatheter Q12H  . sodium chloride  3 mL Intravenous Q12H  . spironolactone  25 mg Oral BID  . traMADol  50-100 mg Oral Q6H  .  warfarin  3 mg Oral ONCE-1800  . Warfarin - Pharmacist Dosing Inpatient   Does not apply q1800   Continuous Infusions:   Pamella Pert, MD  [facility] Pager [phone]  If 7PM-7AM, please contact night-coverage [email or web address] Password [email or web address] 07/16/2012, 11:56 AM   LOS: 9 days

## 2012-07-16 NOTE — Progress Notes (Signed)
ANTICOAGULATION CONSULT NOTE - Follow Up  Pharmacy Consult for warfarin Indication: atrial fibrillation  Allergies  Allergen Reactions  . Amoxicillin     REACTION: swelling and a rash  . Morphine Nausea And Vomiting  . Vancomycin     Pt c/o of feeling flushed and red, arm red around IV site    Patient Measurements: Height: 5\' 11"  (180.3 cm) Weight: 220 lb (99.791 kg) IBW/kg (Calculated) : 75.3 Heparin Dosing Weight:   Vital Signs: Temp: 98.4 F (36.9 C) (05/08 0531) Temp src: Oral (05/08 0531) BP: 125/70 mmHg (05/08 0531) Pulse Rate: 64 (05/08 0531)  Labs:  Recent Labs  07/14/12 0412  07/14/12 0500 07/15/12 0440 07/16/12 0518  HGB  --   < > 8.4* 8.6* 9.2*  HCT  --   --  26.1* 26.8* 28.5*  PLT  --   --  214 227 274  LABPROT 17.0*  --   --  18.0* 18.9*  INR 1.42  --   --  1.54* 1.64*  CREATININE  --   --  1.05 1.00 0.96  < > = values in this interval not displayed.  Estimated Creatinine Clearance: 82.5 ml/min (by C-G formula based on Cr of 0.96).   Medical History: Past Medical History  Diagnosis Date  . HTN (hypertension)   . Obesity   . History of cholecystectomy   . CAD (coronary artery disease)     Status post CABG followed by redo in 1994 / nuclear 2007, old infarct, no ischemia  . Ejection fraction < 50%     EF  45%...echo 2004 /  EF 45%, echo, August, 2012  . Hyperlipidemia   . Cigarette smoker     He is now smoking cigars and needs to quit.  . 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 no replacement until leg is stable  . Prostate cancer     History of prostate cancer with a seed implant and this is stable.  . Abdominal aortic aneurysm     is stable, being followed  . Atrial fibrillation     Rate control / going in and out of atrial fib, Coumadin  . Volume overload   . Right bundle branch block     noted November 09, 2008.  . Warfarin anticoagulation     Atrial fibrillation  . Carotid artery disease     Doppler  September 2 009, 40-50% LICA / Doppler October, 2010, 0-39% R. ICA 40-59% LICA  . Diabetes mellitus   . Myocardial infarction 1984  . Ulcer   . Peripheral vascular disease   . 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 Heart murmur   . Arthritis     Medications:  Prescriptions prior to admission  Medication Sig Dispense Refill  . docusate sodium (COLACE) 100 MG capsule Take 100-200 mg by mouth daily as needed. For constipation      . furosemide (LASIX) 40 MG tablet Take 40 mg by mouth every evening. Take one (1) tablet by mouth daily.      Marland Kitchen gabapentin (NEURONTIN) 100 MG capsule Take 100 mg by mouth 3 (three) times daily as needed. For phantom pain      . magnesium gluconate (MAGONATE) 500 MG tablet Take 500 mg by mouth daily.       . metFORMIN (GLUCOPHAGE) 850 MG tablet Take 850 mg by mouth 2 (two) times daily with a meal.       .  metolazone (ZAROXOLYN) 2.5 MG tablet Take 2.5 mg by mouth daily.       . potassium chloride SA (K-DUR,KLOR-CON) 20 MEQ tablet Take 40 mEq by mouth every evening.       . simvastatin (ZOCOR) 40 MG tablet Take 1 tablet (40 mg total) by mouth at bedtime.  90 tablet  3  . spironolactone (ALDACTONE) 25 MG tablet Take 25 mg by mouth 2 (two) times daily. Take one (1) tablet by mouth two (2) times a day.      . warfarin (COUMADIN) 2.5 MG tablet Take 1.25-2.5 mg by mouth every evening. Takes 1.25 mg on sun, tue, thu, fri and 2.5 mg all other days        Assessment: 71 YOM on chronic coumadin for hx Afib. Has been on hold for planned I and D of infected RTKR. Pt is now s/p surgery, coumadin resumed 5/5 PM. Dose PTA 1.25mg  on Sun, Tues, Thurs, and Friday; 2.5mg  other days  INR is subtx, up some after 3mg  last 3 nights  H/H okay, no bleeding repoted  Note patient has been on Flagyl since 5/5 - to d/c per ID discussion with Dr. Luciana Axe  Goal of Therapy:  INR 2-3   Plan:  1) Repeat 3mg  warfarin tonight - expect INR to continue to  rise since increasing now and Flagyl still on board 2) Daily INR   Hessie Knows, PharmD, BCPS Pager [phone] 07/16/2012 10:44 AM

## 2012-07-16 NOTE — Care Management Note (Signed)
Cm noted PT eval for SNf/24 hour supervision. Per pt and spouse disposition is home with [facility] services provided by [facility]. [facility] rep Renea Ee has consulted with pt and family concerning pt discharging home with IV ABX. Pt request RN order for PT/INR included in service. Pt will require MD order for [facility] :specific orders for frequency, duration, dosage of IV ABX, PICC protocol, any labs required with administered ABX, and PT/INR as required prior to discharge.. Pt states having home DME.    Roxy Manns Yuritza Paulhus,RN,BSN [phone]

## 2012-07-16 NOTE — Progress Notes (Signed)
   Subjective: 3 Days Post-Op Procedure(s) (LRB): IRRIGATION AND DEBRIDEMENT KNEE WITH POLY EXCHANGE (Right)   Patient reports pain as mild, pain well controled. However he is not fully aware and meds are knocking him out a little.   Objective:   VITALS:   Filed Vitals:   07/16/12 0531  BP: 125/70  Pulse: 64  Temp: 98.4 F (36.9 C)  Resp: 15    Incision: dressing C/D/I Wound looks to be healing well with the edges still approximated well. Cellulitis appears to continue healing.  LABS  Recent Labs  07/14/12 0500 07/15/12 0440 07/16/12 0518  HGB 8.4* 8.6* 9.2*  HCT 26.1* 26.8* 28.5*  WBC 10.0 9.1 11.6*  PLT 214 227 274     Recent Labs  07/14/12 0500 07/15/12 0440 07/16/12 0518  NA 136 134* 136  K 3.9 3.9 3.8  BUN 23 19 20   CREATININE 1.05 1.00 0.96  GLUCOSE 202* 134* 135*     Assessment/Plan: 3 Days Post-Op Procedure(s) (LRB): IRRIGATION AND DEBRIDEMENT KNEE WITH POLY EXCHANGE (Right) Dressing changed to Aquacel today Should continue to maintain knee immobilizer at all times, no flexion of the knee to allow wound to heal Up with therapy Discharge home with home health when ready Follow up in 2 weeks at [facility]. Follow up with OLIN,Nysir Fergusson D in 2 weeks.  Contact information:  [facility] [address], [address] [place] [address] [phone]       ID Recommendations per Dr. Luciana Axe:  Continue cefepime for at least 6 weeks  For Home health, he will need weekly CBC with diff, CMP to [facility]  We will arrange follow up for him with [place] at discharge      Anastasio Auerbach. Alfred Harrel   PAC  07/16/2012, 11:08 AM

## 2012-07-17 DIAGNOSIS — E1159 Type 2 diabetes mellitus with other circulatory complications: Secondary | ICD-10-CM | POA: Diagnosis present

## 2012-07-17 DIAGNOSIS — I739 Peripheral vascular disease, unspecified: Secondary | ICD-10-CM

## 2012-07-17 LAB — GLUCOSE, CAPILLARY: Glucose-Capillary: 150 mg/dL — ABNORMAL HIGH (ref 70–99)

## 2012-07-17 LAB — PROTIME-INR: INR: 1.75 — ABNORMAL HIGH (ref 0.00–1.49)

## 2012-07-17 MED ORDER — ACETAMINOPHEN 325 MG PO TABS
650.0000 mg | ORAL_TABLET | Freq: Four times a day (QID) | ORAL | Status: DC | PRN
Start: 2012-07-17 — End: 2013-05-31

## 2012-07-17 MED ORDER — HEPARIN SOD (PORK) LOCK FLUSH 100 UNIT/ML IV SOLN
INTRAVENOUS | Status: AC
  Administered 2012-07-17: 11:00:00
  Filled 2012-07-17: qty 5

## 2012-07-17 MED ORDER — DEXTROSE 5 % IV SOLN: 2.0000 g | Freq: Two times a day (BID) | INTRAVENOUS | Status: DC

## 2012-07-17 MED ORDER — TRAMADOL HCL 50 MG PO TABS: 50.0000 mg | ORAL_TABLET | Freq: Four times a day (QID) | ORAL | Status: DC | PRN

## 2012-07-17 NOTE — Progress Notes (Signed)
Clinical Social Work Department CLINICAL SOCIAL WORK PLACEMENT NOTE 07/17/2012  Patient:  Derek Frye, Derek Frye  Account Number:  [id number] Admit date:  07/07/2012  Clinical Social Worker:  Jacelyn Grip  Date/time:  07/17/2012 11:45 AM  Clinical Social Work is seeking post-discharge placement for this patient at the following level of care:   SKILLED NURSING   (*CSW will update this form in Epic as items are completed)   07/17/2012  Patient/family provided with [facility] Department of Clinical Social Work's list of facilities offering this level of care within the geographic area requested by the patient (or if unable, by the patient's family).  07/17/2012  Patient/family informed of their freedom to choose among providers that offer the needed level of care, that participate in Medicare, Medicaid or managed care program needed by the patient, have an available bed and are willing to accept the patient.  07/17/2012  Patient/family informed of [facility]' ownership interest in [facility], as well as of the fact that they are under no obligation to receive care at this facility.  PASARR submitted to EDS on 07/17/2012 PASARR number received from EDS on 07/17/2012- existing  FL2 transmitted to all facilities in geographic area requested by pt/family on  07/17/2012 FL2 transmitted to all facilities within larger geographic area on   Patient informed that his/her managed care company has contracts with or will negotiate with  certain facilities, including the following:     Patient/family informed of bed offers received:  07/17/2012 Patient chooses bed at [facility] Physician recommends and patient chooses bed at    Patient to be transferred to [facility] on  07/17/2012 Patient to be transferred to facility by ambulance Derek Frye)  The following physician request were entered in Epic:   Additional Comments:    Jacklynn Lewis, MSW, LCSWA  Clinical Social Work [phone]

## 2012-07-17 NOTE — Care Management Note (Addendum)
Per pt and spouse discharge plan has changed from Home with [facility] services to SNF upon PT eval this am. CSW and MD notified.   Sharmon Leyden, RN,BSN [phone]

## 2012-07-17 NOTE — Progress Notes (Signed)
Physical Therapy Treatment Patient Details Name: Derek Frye MRN: [id number] DOB: Jun 18, 1940 Today's Date: 07/17/2012 Time: 1055-1110 PT Time Calculation (min): 15 min  PT Assessment / Plan / Recommendation Comments on Treatment Session  Pt was hoping to DC home today. However he requires +2 assist for transfer bed to chair, he will only have assist of wife at home and there is high risk of injury to her and to pt at current functional status. Functional status has improved since last PT visit, but still requiring +2 assist for safety.  Strongly recommended SNF, pt/wife agreeable. Notified RN and case Production designer, theatre/television/film.     Follow Up Recommendations  SNF;Supervision/Assistance - 24 hour     Does the patient have the potential to tolerate intense rehabilitation     Barriers to Discharge        Equipment Recommendations  None recommended by PT    Recommendations for Other Services OT consult  Frequency Min 3X/week   Plan Discharge plan remains appropriate;Frequency remains appropriate    Precautions / Restrictions Precautions Precautions: Fall Required Braces or Orthoses: Knee Immobilizer - Right Restrictions Weight Bearing Restrictions: No   Pertinent Vitals/Pain *5/10 R knee premedicated**    Mobility  Bed Mobility Bed Mobility: Supine to Sit;Scooting to HOB Supine to Sit: HOB elevated;2: Max assist Supine to Sit: Patient Percentage: 50% Details for Bed Mobility Assistance: , assist to elevate trunk and support RLE with supine to sit Transfers Transfers: Sit to Stand;Stand to Sit;Stand Pivot Transfers Sit to Stand: 1: +2 Total assist;From bed;With upper extremity assist;From elevated surface;With armrests Sit to Stand: Patient Percentage: 60% Stand to Sit: 1: +2 Total assist;To bed;To elevated surface Stand to Sit: Patient Percentage: 70% Stand Pivot Transfers: 1: +2 Total assist Stand Pivot Transfers: Patient Percentage: 50% Details for Transfer Assistance: bed elevated,  pt wearing R KI, +2 assist for SPT from elevated bed to recliner, pt used armrests on recliner, assist required to initiate pivot and to clear hips over armrests Ambulation/Gait Ambulation/Gait Assistance: Not tested (comment)    Exercises     PT Diagnosis:    PT Problem List:   PT Treatment Interventions:     PT Goals Acute Rehab PT Goals PT Goal Formulation: With patient Time For Goal Achievement: 07/28/12 Potential to Achieve Goals: Fair Pt will go Supine/Side to Sit: with mod assist PT Goal: Supine/Side to Sit - Progress: Progressing toward goal Pt will go Sit to Stand: with max assist PT Goal: Sit to Stand - Progress: Progressing toward goal Pt will go Stand to Sit: with max assist PT Goal: Stand to Sit - Progress: Progressing toward goal Pt will Transfer Bed to Chair/Chair to Bed: with max assist PT Transfer Goal: Bed to Chair/Chair to Bed - Progress: Progressing toward goal  Visit Information  Last PT Received On: 07/17/12 Assistance Needed: +2    Subjective Data  Subjective:  -pt/wife  agreeable to SNF Patient Stated Goal: be able to transfer to [facility]   Cognition  Cognition Arousal/Alertness: Awake/alert Behavior During Therapy: WFL for tasks assessed/performed Overall Cognitive Status: Within Functional Limits for tasks assessed    Balance     End of Session PT - End of Session Equipment Utilized During Treatment: Gait belt Activity Tolerance: Patient limited by fatigue Patient left: with call bell/phone within reach;in chair;with family/visitor present Nurse Communication: Mobility status   GP     Ralene Bathe Kistler 07/17/2012, 11:24 AM [phone]

## 2012-07-17 NOTE — Progress Notes (Signed)
   Subjective: 4 Days Post-Op Procedure(s) (LRB): IRRIGATION AND DEBRIDEMENT KNEE WITH POLY EXCHANGE (Right)   Patient reports pain as mild, pain well controlled. No events throughout the night. Feels that the medicine is not effecting his head any more and feels more clear.  Objective:   VITALS:   Filed Vitals:   07/17/12 0515  BP: 112/48  Pulse: 60  Temp: 98.1 F (36.7 C)  Resp: 16    Dorsiflexion/Plantar flexion intact Incision: dressing C/D/I  LABS  Recent Labs  07/15/12 0440 07/16/12 0518  HGB 8.6* 9.2*  HCT 26.8* 28.5*  WBC 9.1 11.6*  PLT 227 274     Recent Labs  07/15/12 0440 07/16/12 0518  NA 134* 136  K 3.9 3.8  BUN 19 20  CREATININE 1.00 0.96  GLUCOSE 134* 135*     Assessment/Plan: 4 Days Post-Op Procedure(s) (LRB): IRRIGATION AND DEBRIDEMENT KNEE WITH POLY EXCHANGE (Right) Should continue to maintain knee immobilizer at all times, no flexion of the knee to allow wound to heal  Up with therapy  Discharge home with home health when ready medically Orthopaedically stable We would like to continue with cefepime for at least 8 weeks  Will need to follow up with [facility] to eventually obtain the best long term oral abx to suppress potential future issues For Home health, he will need weekly CBC with diff, CMP to [facility] Will need follow up with [facility] (Dr. Luciana Axe) Follow up in 2 weeks at [facility]. Follow up with OLIN,Neda Willenbring D in 10 days.  Contact information:  [facility] [address], [address] [place] [address] [phone]          Anastasio Auerbach. Chinonso Linker   PAC  07/17/2012, 8:28 AM

## 2012-07-17 NOTE — Progress Notes (Signed)
Clinical Social Work Department BRIEF PSYCHOSOCIAL ASSESSMENT 07/17/2012  Patient:  Derek Frye, Derek Frye     Account Number:  [id number]     Admit date:  07/07/2012  Clinical Social Worker:  Jacelyn Grip  Date/Time:  07/17/2012 11:45 AM  Referred by:  Physician  Date Referred:  07/17/2012 Referred for  SNF Placement   Other Referral:   Interview type:  Patient Other interview type:   and patient wife and family at bedside    PSYCHOSOCIAL DATA Living Status:  WIFE Admitted from facility:   Level of care:   Primary support name:  Derek Frye,Derek Frye; spouse Primary support relationship to patient:  SPOUSE Degree of support available:   adequate    CURRENT CONCERNS Current Concerns  Post-Acute Placement   Other Concerns:    SOCIAL WORK ASSESSMENT / PLAN CSW received notification that pt and pt wife are now requesting SNF placement and pt medically stable for discharge today.    CSW met with pt and pt family at bedside. Pt wife reported that she is primary caregiver and cannot manage pt care at this time. Pt and pt wife agreeable to SNF search to [facility]. Pt wife interested in [place]. CSW initiated SNF search and notified [facility].    [facility] able to offer a bed. Pt and pt wife accepting of bed offer.    CSW facilitated pt discharge needs including contacting facility, faxing pt discharge information via [facility], discussing with pt at bedside, providing RN phone number to call report and arranging ambulance transport to [facility].    No further social work needs identified at this time.    CSW signing off.   Assessment/plan status:  No Further Intervention Required Other assessment/ plan:   Information/referral to community resources:   [facility] list.    PATIENT'S/FAMILY'S RESPONSE TO PLAN OF CARE: Pt alert and oriented and pt and pt family agreeable to SNF for ST rehab as they feel pt care needs would not be able to be managed at home at this  time. [facility] able to accept and pt d/c to [facility] for ST SNF stay.     Jacklynn Lewis, MSW, LCSWA  Clinical Social Work [phone]

## 2012-07-17 NOTE — Discharge Summary (Signed)
Physician Discharge Summary  MONTFORD BARG ZOX:[id number] DOB: 1940/07/26 DOA: 07/07/2012  PCP: Pamelia Hoit, MD  Admit date: 07/07/2012 Discharge date: 07/17/2012  Time spent: 45 minutes  Recommendations for Outpatient Follow-up:  1. Follow up with ID in 2-3 weeks 2. Please follow up with PCP in 1 week for post discharge evaluation  Recommendations for prolonged knee infection treatment.  - Continue Cefepime 2 g every 12 hours for 6 additional weeks - maintain PICC Line throughout treatment - do not remove PICC line by default after treatment unless specifically instructed by ID MD - Please obtain weekly CBC and CMP and fax results to [facility] - please obtain weekly INR and fax results to PCP, further instructions per PCP.  Discharge Diagnoses:  Principal Problem:   Cellulitis and abscess of lower leg Active Problems:   DYSLIPIDEMIA   AAA   PERIPHERAL VASCULAR DISEASE   HTN (hypertension)   CAD (coronary artery disease)   Atrial fibrillation   Warfarin anticoagulation   Type II or unspecified type diabetes mellitus with peripheral circulatory disorders, uncontrolled(250.72)  Discharge Condition: stable  Diet recommendation: heart healthy, diabetic  Filed Weights   07/07/12 2043  Weight: 99.791 kg (220 lb)   History of present illness:  Patient is a pleasant 72 year old obese white man with multiple medical problems including coronary artery disease, AAA with recent stent placement, history of bilateral knee replacements that terminated with 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 He presented to the hospital today directly from Dr. Nilsa Nutting office. He went there for evaluation of right lower extremity edema and redness that he first noticed about 48 hours prior to this admission. It has gotten progressively worse and is now involving the right knee. We have been asked to admit him for further evaluation and management.  Please note the patient denies fever, chills, other systemic concerns.   Hospital Course:  Right lower extremity cellulitis, septic knee arthritis Patient is now status post I&D and doing well clinically. Wound cultures grew Pseudomonas. ID has been following patient while hospitalized and recommended Cefepime for 6 weeks and they will set up follow up in [facility] in 2-3 weeks following discharge. Continue supportive care with analgesia as needed, keep extremity elevated. Diarrhea - negative C. Diff by PCR, diarrhea now resolved  Acute renal failure now creatinine within normal limits, continue Lasix  Diabetes mellitus type 2 - A1c 6.2 (07/07/2012), continue sliding scale insulin while inpatient, good control while inpatient. Patient will be able to continue metformin upon discharge as his renal function is stable  Anemia of chronic disease Hemoglobin stable, no current indication for transfusion  DYSLIPIDEMIA - Stable medical issue, continue simvastatin  AAA - Stable clinical issue  HTN (hypertension) - reasonable inpatient control   CAD (coronary artery disease) - stable medical issue  Atrial fibrillation - rate controlled, continue coumadin per pharmacy   Procedures:  Irrigation and debridement right knee with poly exchange 07/13/2012   Consultations:  Orthopedics  ID  Discharge Exam: Filed Vitals:   07/16/12 0531 07/16/12 1335 07/16/12 2125 07/17/12 0515  BP: 125/70 110/67 128/51 112/48  Pulse: 64 66 63 60  Temp: 98.4 F (36.9 C) 98 F (36.7 C) 98.6 F (37 C) 98.1 F (36.7 C)  TempSrc: Oral  Oral Oral  Resp: 15 16 18 16   Height:      Weight:      SpO2: 95% 96% 93% 94%   General: NAD Cardiovascular: RRR Respiratory: CTA biL  Discharge  Instructions  Discharge Orders   Future Appointments Provider Department Dept Phone   07/21/2012 11:30 AM [facility] [phone]   12/07/2012 10:30 AM [facility] [place] [facility]  [facility] [phone]   12/07/2012 11:30 AM Nada Libman, MD [facility] [phone]   Future Orders Complete By Expires     Call MD / Call 911  As directed     Comments:      If you experience chest pain or shortness of breath, CALL 911 and be transported to the hospital emergency room.  If you develope a fever above 101 F, pus (white drainage) or increased drainage or redness at the wound, or calf pain, call your surgeon's office.    Constipation Prevention  As directed     Comments:      Drink plenty of fluids.  Prune juice may be helpful.  You may use a stool softener, such as Colace (over the counter) 100 mg twice a day.  Use MiraLax (over the counter) for constipation as needed.    Diet - low sodium heart healthy  As directed     Discharge instructions  As directed     Comments:      Maintain surgical dressing for 10-14 days, when you return to the office.  If drainage does occur call the office. Maintain knee immobilizer at all times, no flexion of the knee. Keep the area dry and clean until follow up. Follow up in 2 weeks at [facility]. Call with any questions or concerns.    Weight bearing as tolerated  As directed         Medication List    TAKE these medications       acetaminophen 325 MG tablet  Commonly known as:  TYLENOL  Take 2 tablets (650 mg total) by mouth every 6 (six) hours as needed.     dextrose 5 % SOLN 50 mL with ceFEPIme 2 G SOLR 2 g  Inject 2 g into the vein every 12 (twelve) hours.     docusate sodium 100 MG capsule  Commonly known as:  COLACE  Take 100-200 mg by mouth daily as needed. For constipation     furosemide 40 MG tablet  Commonly known as:  LASIX  Take 40 mg by mouth every evening. Take one (1) tablet by mouth daily.     gabapentin 100 MG capsule  Commonly known as:  NEURONTIN  Take 100 mg by mouth 3 (three) times daily as needed. For phantom pain     magnesium gluconate 500 MG  tablet  Commonly known as:  MAGONATE  Take 500 mg by mouth daily.     metFORMIN 850 MG tablet  Commonly known as:  GLUCOPHAGE  Take 850 mg by mouth 2 (two) times daily with a meal.     metolazone 2.5 MG tablet  Commonly known as:  ZAROXOLYN  Take 2.5 mg by mouth daily.     potassium chloride SA 20 MEQ tablet  Commonly known as:  K-DUR,KLOR-CON  Take 40 mEq by mouth every evening.     simvastatin 40 MG tablet  Commonly known as:  ZOCOR  Take 1 tablet (40 mg total) by mouth at bedtime.     spironolactone 25 MG tablet  Commonly known as:  ALDACTONE  Take 25 mg by mouth 2 (two) times daily. Take one (1) tablet by mouth two (2) times a day.  traMADol 50 MG tablet  Commonly known as:  ULTRAM  Take 1-2 tablets (50-100 mg total) by mouth every 6 (six) hours as needed for pain.     warfarin 2.5 MG tablet  Commonly known as:  COUMADIN  Take 1.25-2.5 mg by mouth every evening. Takes 1.25 mg on sun, tue, thu, fri and 2.5 mg all other days           Follow-up Information   Follow up with Shelda Pal, MD. Schedule an appointment as soon as possible for a visit in 10 days.   Contact information:   [address] [place] [address] [phone]       Schedule an appointment as soon as possible for a visit with Staci Righter, MD.   Contact information:   [address] [place] [address] [phone]       The results of significant diagnostics from this hospitalization (including imaging, microbiology, ancillary and laboratory) are listed below for reference.    Significant Diagnostic Studies: Dg Chest Port 1 View  07/14/2012  *RADIOLOGY REPORT*  Clinical Data: Confirm line placement.  PORTABLE CHEST - 1 VIEW  Comparison: One-view chest 04/30/2012.  Findings: Heart is enlarged.  Mild interstitial edema is evident. A right-sided PICC line projects over the mid SVC.  Degenerative changes are again noted in the shoulders.  IMPRESSION:  1.  The  tip of the right-sided PICC line projects over the mid SVC. 2.  Cardiomegaly and mild interstitial edema.   Original Report Authenticated By: Marin Roberts, M.D.     Microbiology: Recent Results (from the past 240 hour(s))  CULTURE, BLOOD (ROUTINE X 2)     Status: None   Collection Time    07/07/12  6:50 PM      Result Value Range Status   Specimen Description BLOOD RIGHT ARM   Final   Special Requests BOTTLES DRAWN AEROBIC AND ANAEROBIC 10CC   Final   Culture  Setup Time 07/08/2012 03:19   Final   Culture NO GROWTH 5 DAYS   Final   Report Status 07/14/2012 FINAL   Final  CULTURE, BLOOD (ROUTINE X 2)     Status: None   Collection Time    07/07/12  7:00 PM      Result Value Range Status   Specimen Description BLOOD RIGHT ARM   Final   Special Requests BOTTLES DRAWN AEROBIC AND ANAEROBIC 4CC   Final   Culture  Setup Time 07/07/2012 23:59   Final   Culture NO GROWTH 5 DAYS   Final   Report Status 07/14/2012 FINAL   Final  BODY FLUID CULTURE     Status: None   Collection Time    07/10/12 10:50 AM      Result Value Range Status   Specimen Description SYNOVIAL KNEE   Final   Special Requests NONE   Final   Gram Stain     Final   Value: CYTOSPIN WBC PRESENT,BOTH PMN AND MONONUCLEAR     NO ORGANISMS SEEN     Gram Stain Report Called to,Read Back By and Verified With: Gram Stain Report Called to,Read Back By and Verified With: Liliana Cline RN AT 1155 ON 05.02.14 BY SHUEA Performed by [facility]   Culture     Final   Value: RARE PSEUDOMONAS AERUGINOSA     Note: CRITICAL RESULT CALLED TO, READ BACK BY AND VERIFIED WITH: CHRISTY Vigeant 07/12/12 @ 12:53PM BY RUSCA.   Report Status 07/13/2012 FINAL  Final   Organism ID, Bacteria PSEUDOMONAS AERUGINOSA   Final  GRAM STAIN     Status: None   Collection Time    07/10/12 10:50 AM      Result Value Range Status   Specimen Description SYNOVIAL KNEE   Final   Special Requests NONE   Final   Gram Stain     Final   Value: CYTOSPIN      WBC PRESENT,BOTH PMN AND MONONUCLEAR     NO ORGANISMS SEEN     Gram Stain Report Called to,Read Back By and Verified With: Liliana Cline RN AT 1155 ON 05.02.14 BY SHUEA   Report Status 07/10/2012 FINAL   Final  CLOSTRIDIUM DIFFICILE BY PCR     Status: None   Collection Time    07/11/12  6:28 PM      Result Value Range Status   C difficile by pcr NEGATIVE  NEGATIVE Final  SURGICAL PCR SCREEN     Status: None   Collection Time    07/12/12  7:59 AM      Result Value Range Status   MRSA, PCR NEGATIVE  NEGATIVE Final   Staphylococcus aureus NEGATIVE  NEGATIVE Final   Comment:            The Xpert SA Assay (FDA     approved for NASAL specimens     in patients over 74 years of age),     is one component of     a comprehensive surveillance     program.  Test performance has     been validated by [employer] for patients greater     than or equal to 68 year old.     It is not intended     to diagnose infection nor to     guide or monitor treatment.     Labs: Basic Metabolic Panel:  Recent Labs Lab 07/12/12 0410 07/13/12 0405 07/14/12 0500 07/15/12 0440 07/16/12 0518  NA 135 136 136 134* 136  K 3.8 3.6 3.9 3.9 3.8  CL 99 98 98 95* 94*  CO2 27 30 31  33* 34*  GLUCOSE 153* 117* 202* 134* 135*  BUN 24* 22 23 19 20   CREATININE 1.02 1.11 1.05 1.00 0.96  CALCIUM 8.7 8.7 8.6 8.8 9.2   CBC:  Recent Labs Lab 07/12/12 0410 07/13/12 0405 07/14/12 0500 07/15/12 0440 07/16/12 0518  WBC 9.8 8.5 10.0 9.1 11.6*  HGB 9.6* 9.4* 8.4* 8.6* 9.2*  HCT 30.2* 29.1* 26.1* 26.8* 28.5*  MCV 85.8 86.6 87.9 87.3 85.6  PLT 170 192 214 227 274   CBG:  Recent Labs Lab 07/16/12 0725 07/16/12 1151 07/16/12 1722 07/16/12 2118 07/17/12 0754  GLUCAP 128* 184* 112* 126* 150*   Signed:  Lesly Joslyn  [facility] 07/17/2012, 11:15 AM

## 2012-07-20 ENCOUNTER — Non-Acute Institutional Stay (SKILLED_NURSING_FACILITY): Payer: Medicare Other | Admitting: Adult Health

## 2012-07-20 ENCOUNTER — Encounter: Payer: Self-pay | Admitting: Adult Health

## 2012-07-20 DIAGNOSIS — E1159 Type 2 diabetes mellitus with other circulatory complications: Secondary | ICD-10-CM

## 2012-07-20 DIAGNOSIS — Z7901 Long term (current) use of anticoagulants: Secondary | ICD-10-CM

## 2012-07-20 DIAGNOSIS — I4891 Unspecified atrial fibrillation: Secondary | ICD-10-CM

## 2012-07-20 DIAGNOSIS — E8770 Fluid overload, unspecified: Secondary | ICD-10-CM

## 2012-07-20 DIAGNOSIS — E785 Hyperlipidemia, unspecified: Secondary | ICD-10-CM

## 2012-07-20 DIAGNOSIS — E8779 Other fluid overload: Secondary | ICD-10-CM

## 2012-07-20 DIAGNOSIS — I739 Peripheral vascular disease, unspecified: Secondary | ICD-10-CM

## 2012-07-20 DIAGNOSIS — L02419 Cutaneous abscess of limb, unspecified: Secondary | ICD-10-CM

## 2012-07-20 DIAGNOSIS — L03119 Cellulitis of unspecified part of limb: Secondary | ICD-10-CM

## 2012-07-20 DIAGNOSIS — K5900 Constipation, unspecified: Secondary | ICD-10-CM

## 2012-07-20 LAB — GLUCOSE, CAPILLARY: Glucose-Capillary: 186 mg/dL — ABNORMAL HIGH (ref 70–99)

## 2012-07-20 NOTE — Assessment & Plan Note (Signed)
Will continue zocor 40 mg daily  

## 2012-07-20 NOTE — Assessment & Plan Note (Signed)
His inr is 2.8 and will continue his coumadin at his current regimen and will check inr in one week

## 2012-07-20 NOTE — Assessment & Plan Note (Addendum)
Will continue cefepime 2 gm iv twice daily for 6 weeks and  Will continue ultram 50-100 mg every 6 hours as needed will continue to monitor his status

## 2012-07-20 NOTE — Progress Notes (Signed)
Patient ID: Derek Frye, male   DOB: [birth year], 72 y.o.   MRN: [id number]  Allergies  Allergen Reactions  . Amoxicillin     REACTION: swelling and a rash  . Morphine Nausea And Vomiting  . Vancomycin     Pt c/o of feeling flushed and red, arm red around IV site     Chief Complaint  Patient presents with  . Hospitalization Follow-up    HPI:  he has been hospitalized for right lower extremity cellulitis. He is on a 6 week coarse of iv cefepime per I/D. He had his right knee hardware I/D performed; he is here for short term rehab with the goal to return home when he is able to better manage his own adl's    Past Medical History  Diagnosis Date  . HTN (hypertension)   . Obesity   . History of cholecystectomy   . CAD (coronary artery disease)     Status post CABG followed by redo in 1994 / nuclear 2007, old infarct, no ischemia  . Ejection fraction < 50%     EF  45%...echo 2004 /  EF 45%, echo, August, 2012  . Hyperlipidemia   . Cigarette smoker     He is now smoking cigars and needs to quit.  . 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 no replacement until leg is stable  . Prostate cancer     History of prostate cancer with a seed implant and this is stable.  . Abdominal aortic aneurysm     is stable, being followed  . Atrial fibrillation     Rate control / going in and out of atrial fib, Coumadin  . Volume overload   . Right bundle branch block     noted November 09, 2008.  . Warfarin anticoagulation     Atrial fibrillation  . Carotid artery disease     Doppler September 2 009, 40-50% LICA / Doppler October, 2010, 0-39% R. ICA 40-59% LICA  . Diabetes mellitus   . Myocardial infarction 1984  . Ulcer   . Peripheral vascular disease   . 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 Heart murmur   . Arthritis     Past Surgical History  Procedure Laterality Date  . Total knee arthroplasty    . Coronary  artery bypass graft  1994    x2  . Anal fissure repair    . Pr vein bypass graft,aorto-fem-pop    . Leg amputation above knee  Oct. 23, 2012    Left Leg AKA  . Joint replacement      bil knee  . Abdominal aortic endovascular stent graft N/A 04/30/2012    Procedure:  Ultrasound guided,   inserstion of  ABDOMINAL AORTIC ENDOVASCULAR STENT GRAFT;  Surgeon: Nada Libman, MD;  Location: [facility] OR;  Service: Vascular;  Laterality: N/A;  EVAR- Gore  . I&d knee with poly exchange Right 07/13/2012    Procedure: IRRIGATION AND DEBRIDEMENT KNEE WITH POLY EXCHANGE;  Surgeon: Shelda Pal, MD;  Location: [facility] ORS;  Service: Orthopedics;  Laterality: Right;    VITAL SIGNS BP 132/57  Pulse 66  Ht 5\' 11"  (1.803 m)  Wt 210 lb (95.255 kg)  BMI 29.3 kg/m2   Patient's Medications  New Prescriptions   No medications on file  Previous Medications   ACETAMINOPHEN (TYLENOL) 325 MG TABLET    Take 2 tablets (650 mg  total) by mouth every 6 (six) hours as needed.   DEXTROSE 5 % SOLN 50 ML WITH CEFEPIME 2 G SOLR 2 G    Inject 2 g into the vein every 12 (twelve) hours.   DOCUSATE SODIUM (COLACE) 100 MG CAPSULE    Take 100-200 mg by mouth daily as needed. For constipation   FUROSEMIDE (LASIX) 40 MG TABLET    Take 40 mg by mouth every evening. Take one (1) tablet by mouth daily.   GABAPENTIN (NEURONTIN) 100 MG CAPSULE    Take 100 mg by mouth 3 (three) times daily as needed. For phantom pain   MAGNESIUM GLUCONATE (MAGONATE) 500 MG TABLET    Take 500 mg by mouth daily.    METFORMIN (GLUCOPHAGE) 850 MG TABLET    Take 850 mg by mouth 2 (two) times daily with a meal.    METOLAZONE (ZAROXOLYN) 2.5 MG TABLET    Take 2.5 mg by mouth daily.    POTASSIUM CHLORIDE SA (K-DUR,KLOR-CON) 20 MEQ TABLET    Take 40 mEq by mouth every evening.    SIMVASTATIN (ZOCOR) 40 MG TABLET    Take 1 tablet (40 mg total) by mouth at bedtime.   SPIRONOLACTONE (ALDACTONE) 25 MG TABLET    Take 25 mg by mouth 2 (two) times daily. Take one (1) tablet  by mouth two (2) times a day.   TRAMADOL (ULTRAM) 50 MG TABLET    Take 1-2 tablets (50-100 mg total) by mouth every 6 (six) hours as needed for pain.   WARFARIN (COUMADIN) 2.5 MG TABLET    Take 1.25-2.5 mg by mouth every evening. Takes 1.25 mg on sun, tue, thu, fri and 2.5 mg all other days  Modified Medications   No medications on file  Discontinued Medications   No medications on file    SIGNIFICANT DIAGNOSTIC EXAMS     Component Value Date/Time   ALBUMIN 3.4* 07/07/2012 1500   AST 19 07/07/2012 1500   ALT 13 07/07/2012 1500   ALKPHOS 114 07/07/2012 1500   BILITOT 1.3* 07/07/2012 1500       Component Value Date/Time   BUN 20 07/16/2012 0518   GLUCOSE 135* 07/16/2012 0518   CREATININE 0.96 07/16/2012 0518   CREATININE 1.00 03/20/2012 1432   K 3.8 07/16/2012 0518   NA 136 07/16/2012 0518   TSH 0.696 06/19/2010 1433       Component Value Date/Time   WBC 11.6* 07/16/2012 0518   RBC 3.33* 07/16/2012 0518   HGB 9.2* 07/16/2012 0518   HCT 28.5* 07/16/2012 0518   PLT 274 07/16/2012 0518   MCV 85.6 07/16/2012 0518     Review of Systems  Constitutional: Negative for malaise/fatigue.  Respiratory: Negative for cough, shortness of breath and wheezing.   Cardiovascular: Negative for chest pain and leg swelling.  Gastrointestinal: Positive for diarrhea. Negative for heartburn, abdominal pain and constipation.       Is having 3-4 liquid stools per day  Genitourinary: Negative for dysuria.  Musculoskeletal: Negative for myalgias and joint pain.  Psychiatric/Behavioral: Negative for depression. The patient does not have insomnia.     Physical Exam  Constitutional: He is oriented to person, place, and time. He appears well-developed and well-nourished.  Obese   Neck: Neck supple.  Cardiovascular: Normal rate, regular rhythm and intact distal pulses.   Respiratory: Effort normal and breath sounds normal.  GI: Soft. Bowel sounds are normal.  Musculoskeletal: Normal range of motion. He exhibits no  edema.  Is status post left  aka  Neurological: He is alert and oriented to person, place, and time.  Skin: Skin is warm and dry.  Incision line without signs of infection present Right lower leg skin is discolored due to pvd  Psychiatric: He has a normal mood and affect.      ASSESSMENT/ PLAN:   CELLULITIS AND ABSCESS OF LEG EXCEPT FOOT Will continue cefepime 2 gm iv twice daily for 6 weeks and  Will continue ultram 50-100 mg every 6 hours as needed will continue to monitor his status  Hyperlipidemia Will continue zocor 40 mg daily   Warfarin anticoagulation His inr is 2.8 and will continue his coumadin at his current regimen and will check inr in one week  Atrial fibrillation He is stable will continue with his long term coumadin therapy   Fluid overload Is stable will continue metolazone 2.5 mg daily and will continue lasix 40 mg daily with K+ 40 meq daily with spironolactone 25 mg twice daily   Unspecified constipation Will continue colace daily as needed   Type II or unspecified type diabetes mellitus with peripheral circulatory disorders, uncontrolled(250.72) Will continue metformin 875 mg twice daily   PERIPHERAL VASCULAR DISEASE Will continue neurontin 100 mg three times daily     Time spent with patient 50 minutes

## 2012-07-20 NOTE — Assessment & Plan Note (Signed)
Will continue neurontin 100 mg three times daily

## 2012-07-20 NOTE — Assessment & Plan Note (Signed)
Will continue colace daily as needed

## 2012-07-20 NOTE — Assessment & Plan Note (Addendum)
Is stable will continue metolazone 2.5 mg daily and will continue lasix 40 mg daily with K+ 40 meq daily with spironolactone 25 mg twice daily

## 2012-07-20 NOTE — Assessment & Plan Note (Signed)
Will continue metformin 875 mg twice daily

## 2012-07-20 NOTE — Assessment & Plan Note (Signed)
He is stable will continue with his long term coumadin therapy

## 2012-07-24 ENCOUNTER — Non-Acute Institutional Stay (SKILLED_NURSING_FACILITY): Payer: Medicare Other | Admitting: Internal Medicine

## 2012-07-24 ENCOUNTER — Other Ambulatory Visit (HOSPITAL_COMMUNITY): Payer: Self-pay | Admitting: Urology

## 2012-07-24 DIAGNOSIS — E1149 Type 2 diabetes mellitus with other diabetic neurological complication: Secondary | ICD-10-CM

## 2012-07-24 DIAGNOSIS — C61 Malignant neoplasm of prostate: Secondary | ICD-10-CM

## 2012-07-24 DIAGNOSIS — R197 Diarrhea, unspecified: Secondary | ICD-10-CM

## 2012-07-24 DIAGNOSIS — M009 Pyogenic arthritis, unspecified: Secondary | ICD-10-CM

## 2012-07-24 NOTE — Progress Notes (Signed)
Patient ID: Derek Frye, male   DOB: Jul 02, 1940, 72 y.o.   MRN: [id number] Chief complaint; this is an admission to SNF will stay at home health 07/07/2012 through 07/17/2012.  History; this is a 72 year old man who has a history of bilateral total knee replacements. 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 All her he presented to orthopedics with a 48 hour history of rapidly progressing erythema now up to and including the right knee. He was admitted to hospital because of this. He underwent incision and drainage of an infected right total replacement. Wound cultures grew Pseudomonas. He was seen by infectious disease and they recommended cefepime for 6 weeks. His major additional problem was diarrhea in the hospital. It was apparently C. difficile negative, the diarrhea was listed as resolved, however, he is now having liquid bowel movements a day as of yesterday.  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 He has a prosthesis that he uses rarely. He basically is bed to wheelchair transfer, which he does on his right leg.  # type 2 diabetes with A1c was 6.2 #3 coronary artery disease, status post CABG in the 1990s. This is a stable issue. #4 acute renal failure in hospital which normalized. #5 hyperlipidemia. #6 abdominal aortic aneurysm, status post stent. #7 peripheral vascular disease. #8 hypertension. #9 atrial fibrillation on Coumadin. 10 normochromic, normocytic anemia  Medications; cefepime 2 g every 12 hours for 6 weeks, Lasix, 40 mg a day, Neurontin 900 3 times a day for her phantom pain, magnesium gluconate 500 mg daily, metformin 850 twice a day, metolazone 2.5 daily, potassium 40 mEq every evening, simvastatin 40 mg in the evening, Aldactone 25 mg twice a day, warfarin 2.5 mg Saturday, Monday, Wednesday, and 1.25 mg on other days.  Allergies; listed and noted  to amoxicillin, morphine, and vancomycin  Social he lives with his wife in his own home. He is able to transfer editing on his right leg. He is already talking about home. As his own wheelchair.  family history includes Diabetes in his other and Stroke (age of onset: 74) in his father.  Review of systems; Respiratory no cough or shortness of breath. Cardiac no chest pain or palpitations. GI having any liquid bowel movements a day, yesterday. GU no dysuria. Extremity right leg is severe venous stasis and warmth. Skin apparent candidal rash, which was misinterpreted as a drug rash. Last weekend, and, unfortunately, his antibiotic was held for 2.  Physical exam; pulse rate 80, respirations 18. Gen. not in any distress. Respiratory clear entry bilaterally. No crackles or wheezes. Cardiac CABG scar, irregular heart rate no murmurs. Abdomen; dramatics scar horizontally noted. No masses. No liver no spleen. Right lower extremity; severe venous stasis with chronic severe stasis dermatitis is noted But no open areas. Skin is dry, and he will need lubricating skin cream to avoid cracking, cellulitis etc. his right knee incision is well approximated staples in. There is indeed warmth, but I am not thinking that there is any uncontrolled infection. Mild edema and minimal erythema.   Impression/plan #1 pyogenic arthritis, right knee, Pseudomonas on cefepime. Followed by ID. repeat lab work next week. #2 diarrhea; this is listed as resolved when he left the hospital with a negative C. difficile toxin assay. Nonetheless, I am concerned in this setting about this possibility. I don't see any obvious contributors. This morning. The diarrhea and the combination  of medications. He is on I am also concerned about fluid and electrolyte abnormalities. Note a significant drug interaction between Flagyl and Coumadin, if he does have C. difficile oral vancomycin may be a better choice #3 question CHF I'll have to  research [place] Link. If there is a good reason to be on a reasonably small dose of Lasix 40 mg and metolazone. It isn't really obvious to me. He is also on Aldactone. #4 type 2 diabetes with diabetic neuropathy. #5 severe venous stasis with stasis dermatitis, but no open wounds #6 candidal rash involving his buttocks and scrotum, change to lotrisone #7 atrial fib on Coumadin; this seems controlled. Check PT and INR. Careful about antibiotic choices. If this turns out to be pseudomembranous colitis

## 2012-07-27 ENCOUNTER — Non-Acute Institutional Stay (SKILLED_NURSING_FACILITY): Payer: Medicare Other | Admitting: Adult Health

## 2012-07-27 DIAGNOSIS — I4891 Unspecified atrial fibrillation: Secondary | ICD-10-CM

## 2012-07-27 DIAGNOSIS — Z7901 Long term (current) use of anticoagulants: Secondary | ICD-10-CM

## 2012-08-05 ENCOUNTER — Encounter: Payer: Self-pay | Admitting: Adult Health

## 2012-08-05 ENCOUNTER — Non-Acute Institutional Stay (SKILLED_NURSING_FACILITY): Payer: Medicare Other | Admitting: Adult Health

## 2012-08-05 DIAGNOSIS — L02419 Cutaneous abscess of limb, unspecified: Secondary | ICD-10-CM

## 2012-08-05 DIAGNOSIS — E1159 Type 2 diabetes mellitus with other circulatory complications: Secondary | ICD-10-CM

## 2012-08-05 DIAGNOSIS — Z452 Encounter for adjustment and management of vascular access device: Secondary | ICD-10-CM

## 2012-08-05 DIAGNOSIS — M009 Pyogenic arthritis, unspecified: Secondary | ICD-10-CM

## 2012-08-05 DIAGNOSIS — I2510 Atherosclerotic heart disease of native coronary artery without angina pectoris: Secondary | ICD-10-CM

## 2012-08-05 DIAGNOSIS — L03119 Cellulitis of unspecified part of limb: Secondary | ICD-10-CM

## 2012-08-05 DIAGNOSIS — L03115 Cellulitis of right lower limb: Secondary | ICD-10-CM

## 2012-08-05 DIAGNOSIS — I4891 Unspecified atrial fibrillation: Secondary | ICD-10-CM

## 2012-08-05 NOTE — Progress Notes (Signed)
Patient ID: Derek Frye, male   DOB: [birth year], 72 y.o.   MRN: [id number]  FACILITY: [facility]  Allergies  Allergen Reactions  . Amoxicillin     REACTION: swelling and a rash  . Morphine Nausea And Vomiting  . Vancomycin     Pt c/o of feeling flushed and red, arm red around IV site     Chief Complaint  Patient presents with  . Discharge Note    HPI: He is being discharged today to home with home health for pt/ot/nursing for coumadin and iv abt management. He had been hospitalized for right lower leg cellulitis.   Past Medical History  Diagnosis Date  . HTN (hypertension)   . Obesity   . History of cholecystectomy   . CAD (coronary artery disease)     Status post CABG followed by redo in 1994 / nuclear 2007, old infarct, no ischemia  . Ejection fraction < 50%     EF  45%...echo 2004 /  EF 45%, echo, August, 2012  . Hyperlipidemia   . Cigarette smoker     He is now smoking cigars and needs to quit.  . 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 no replacement until leg is stable  . Prostate cancer     History of prostate cancer with a seed implant and this is stable.  . Abdominal aortic aneurysm     is stable, being followed  . Atrial fibrillation     Rate control / going in and out of atrial fib, Coumadin  . Volume overload   . Right bundle branch block     noted November 09, 2008.  . Warfarin anticoagulation     Atrial fibrillation  . Carotid artery disease     Doppler September 2 009, 40-50% LICA / Doppler October, 2010, 0-39% R. ICA 40-59% LICA  . Diabetes mellitus   . Myocardial infarction 1984  . Ulcer   . Peripheral vascular disease   . 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 Heart murmur   . Arthritis     Past Surgical History  Procedure Laterality Date  . Total knee arthroplasty    . Coronary artery bypass graft  1994    x2  . Anal fissure repair    . Pr vein bypass  graft,aorto-fem-pop    . Leg amputation above knee  Oct. 23, 2012    Left Leg AKA  . Joint replacement      bil knee  . Abdominal aortic endovascular stent graft N/A 04/30/2012    Procedure:  Ultrasound guided,   inserstion of  ABDOMINAL AORTIC ENDOVASCULAR STENT GRAFT;  Surgeon: Nada Libman, MD;  Location: [facility] OR;  Service: Vascular;  Laterality: N/A;  EVAR- Gore  . I&d knee with poly exchange Right 07/13/2012    Procedure: IRRIGATION AND DEBRIDEMENT KNEE WITH POLY EXCHANGE;  Surgeon: Shelda Pal, MD;  Location: [facility] ORS;  Service: Orthopedics;  Laterality: Right;    VITAL SIGNS BP 119/60  Pulse 52  Ht 5\' 11"  (1.803 m)  Wt 217 lb 6.4 oz (98.612 kg)  BMI 30.33 kg/m2   Patient's Medications  New Prescriptions   No medications on file  Previous Medications   ACETAMINOPHEN (TYLENOL) 325 MG TABLET    Take 2 tablets (650 mg total) by mouth every 6 (six) hours as needed.   DEXTROSE 5 % SOLN 50 ML WITH CEFEPIME 2 G  SOLR 2 G    Inject 2 g into the vein every 12 (twelve) hours.   DOCUSATE SODIUM (COLACE) 100 MG CAPSULE    Take 100-200 mg by mouth daily as needed. For constipation   FUROSEMIDE (LASIX) 40 MG TABLET    Take 40 mg by mouth every evening. Take one (1) tablet by mouth daily.   GABAPENTIN (NEURONTIN) 100 MG CAPSULE    Take 100 mg by mouth 3 (three) times daily as needed. For phantom pain   MAGNESIUM GLUCONATE (MAGONATE) 500 MG TABLET    Take 500 mg by mouth daily.    METFORMIN (GLUCOPHAGE) 850 MG TABLET    Take 850 mg by mouth 2 (two) times daily with a meal.    POTASSIUM CHLORIDE SA (K-DUR,KLOR-CON) 20 MEQ TABLET    Take 40 mEq by mouth every evening.    SIMVASTATIN (ZOCOR) 40 MG TABLET    Take 1 tablet (40 mg total) by mouth at bedtime.   SPIRONOLACTONE (ALDACTONE) 25 MG TABLET    Take 25 mg by mouth 2 (two) times daily. Take one (1) tablet by mouth two (2) times a day.   TRAMADOL (ULTRAM) 50 MG TABLET    Take 1-2 tablets (50-100 mg total) by mouth every 6 (six) hours as needed  for pain.   WARFARIN (COUMADIN) 2.5 MG TABLET    Take 1.25-2.5 mg by mouth every evening. Takes 1.25 mg on sun, tue, thu, fri and 2.5 mg all other days  Modified Medications   No medications on file  Discontinued Medications   Metolazone 2.5 mg stopped on 07-24-12    SIGNIFICANT DIAGNOSTIC EXAMS     Component Value Date/Time   ALBUMIN 3.4* 07/07/2012 1500   AST 19 07/07/2012 1500   ALT 13 07/07/2012 1500   ALKPHOS 114 07/07/2012 1500   BILITOT 1.3* 07/07/2012 1500       Component Value Date/Time   BUN 20 07/16/2012 0518   GLUCOSE 135* 07/16/2012 0518   CREATININE 0.96 07/16/2012 0518   CREATININE 1.00 03/20/2012 1432   K 3.8 07/16/2012 0518   NA 136 07/16/2012 0518   TSH 0.696 06/19/2010 1433       Component Value Date/Time   WBC 11.6* 07/16/2012 0518   RBC 3.33* 07/16/2012 0518   HGB 9.2* 07/16/2012 0518   HCT 28.5* 07/16/2012 0518   PLT 274 07/16/2012 0518   MCV 85.6 07/16/2012 0518   07-23-12: wbc 10.3; hgb 10.0; hct 30.1; mcv 84.6; plt 377    Review of Systems  Constitutional: Negative for fever and malaise/fatigue.  Respiratory: Negative for cough and shortness of breath.   Cardiovascular: Negative for chest pain, palpitations and leg swelling.  Gastrointestinal: Negative for heartburn, abdominal pain, diarrhea and constipation.  Musculoskeletal: Negative for myalgias and joint pain.  Skin: Negative for itching and rash.  Neurological: Negative for headaches.  Psychiatric/Behavioral: Negative for depression. The patient does not have insomnia.     Physical Exam  Constitutional: He is oriented to person, place, and time. He appears well-developed and well-nourished.  Obese   Neck: Neck supple. No thyromegaly present.  Cardiovascular: Normal rate and regular rhythm.   +pp on right  Respiratory: Effort normal and breath sounds normal.  GI: Soft. Bowel sounds are normal. There is no tenderness.  Musculoskeletal: Normal range of motion. He exhibits no edema.  Status post left bka   Neurological: He is alert and oriented to person, place, and time.  Skin: Skin is warm and dry.  Psychiatric: He  has a normal mood and affect.      ASSESSMENT/ PLAN:  Will discharge him to home with home health for pt/ot/nursing for coumadin management and iv abt therapy will need iv abt through 08-31-12 per I/D will then determine further treatment.   Time spent with patient 45 minutes.

## 2012-08-06 ENCOUNTER — Encounter: Payer: Self-pay | Admitting: Infectious Disease

## 2012-08-06 ENCOUNTER — Telehealth: Payer: Self-pay | Admitting: *Deleted

## 2012-08-06 ENCOUNTER — Ambulatory Visit (INDEPENDENT_AMBULATORY_CARE_PROVIDER_SITE_OTHER): Payer: Medicare Other | Admitting: Infectious Disease

## 2012-08-06 VITALS — BP 118/67 | HR 67 | Temp 98.0°F | Ht 71.0 in | Wt 218.0 lb

## 2012-08-06 DIAGNOSIS — A498 Other bacterial infections of unspecified site: Secondary | ICD-10-CM

## 2012-08-06 DIAGNOSIS — B9689 Other specified bacterial agents as the cause of diseases classified elsewhere: Secondary | ICD-10-CM

## 2012-08-06 DIAGNOSIS — S78119A Complete traumatic amputation at level between unspecified hip and knee, initial encounter: Secondary | ICD-10-CM

## 2012-08-06 DIAGNOSIS — Z1624 Resistance to multiple antibiotics: Secondary | ICD-10-CM

## 2012-08-06 DIAGNOSIS — Z5189 Encounter for other specified aftercare: Secondary | ICD-10-CM

## 2012-08-06 DIAGNOSIS — T8450XD Infection and inflammatory reaction due to unspecified internal joint prosthesis, subsequent encounter: Secondary | ICD-10-CM

## 2012-08-06 DIAGNOSIS — B965 Pseudomonas (aeruginosa) (mallei) (pseudomallei) as the cause of diseases classified elsewhere: Secondary | ICD-10-CM

## 2012-08-06 DIAGNOSIS — Z89612 Acquired absence of left leg above knee: Secondary | ICD-10-CM

## 2012-08-06 DIAGNOSIS — A499 Bacterial infection, unspecified: Secondary | ICD-10-CM

## 2012-08-06 MED ORDER — DEXTROSE 5 % IV SOLN: 2.0000 g | Freq: Three times a day (TID) | INTRAVENOUS | Status: DC

## 2012-08-06 NOTE — Progress Notes (Signed)
Subjective:    Patient ID: Derek Frye, male    DOB: 1940/10/07, 72 y.o.   MRN: [id number]  HPI   72  yo man who had prior infection of left TKA in 2005 apparently managed by Dr. Roxan Hockey at that time. According to the wife and the pt had infection due to a cat scratch. 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 Roxan Hockey or of isolation of Pasteurella from culture. IN any case he had recurrent infection in his left knee this July 2011after he had bout of cellulitis that originated with ulcers in lower extremity. He was treated by the hospitalist team with broad spectrum antibiotics. Ultimately he underwent I and D with resection arthroplasty and placement of antibiotic spacer on 09/28/09. Intraoperative cultures grew Diphtheroids, though one culture was initially read as CNS and the other as having GNR.He was placed on IV Vancomycin and treated for nearly 12 weeks with IV vancomycin and seen in [place] by Dr. Charlann Boxer who aspirated knee in September 2011 and encountered wbc of 1800 on aspiraate without any organisms growing. I saw him this fall and put him on doxycyline. I did a swab culture of surface exudate from on of his wound which grew proteus but I considered it a skin contaminant. In the interim hedeveloped recurrenc of infection in his knee and uderwent yet again I and D and removal of antibiotic spacer by Dr Charlann Boxer. Cultures now grew a pure GBS. He was changed to rocephintransitioned to doxycycline and levofloxacin which he remains on until I saw him the last time. Marland Kitchen He has continued to have a great deal of difficulty healing the open ulcers on his feet In particular the one on his heel which was cultured by podiatry and which yielded Stenotrophomonas which was R to bactrim, levaquin but s to minocycline. I cultured material from one of these lesions and it again yielded S. Maltophila that was now I to minocycline but R to all other antibiotics tested.   He  ultimately underwent left AKA on 102412  He recently developed right sided prosthetic knee infection this May and underwent I and D by Dr. Charlann Boxer on [phone] with polyexchange. Cultures unfortunately grew Pseudomonas Aeruginosa R to FQ, currently on IV cefepime 2 g iv q 12 hours.  He has had problems with erythema and irritation near the operative site which he and his wife attribute to the wound being too vigorously removed from the site.   I discussed extending this therapy beyond the typical 6 weeks to at least 2 months if not 3 months.  I again reiterated that there were no oral antibiotics we can give to suppress infection in this knee and I have concern for residual infection given the fact that was in exchange for a topical arthroplasty and given that this was an infection with Pseudomonas.  I assured him that the best way to ensure cure would be for him to have the prosthesis removed and washed out followed by 6 weeks to 8 weeks of IV antibiotics with consideration of reimplantation. I've as he given the fact that he has no left leg in place complicates his cardiac care.  I will discuss with Dr. Charlann Boxer.  Review of Systems  Constitutional: Negative for fever, chills, diaphoresis, activity change, appetite change, fatigue and unexpected weight change.  HENT: Negative for congestion, sore throat, rhinorrhea, sneezing, trouble swallowing and sinus pressure.   Eyes: Negative for photophobia and visual disturbance.  Respiratory: Negative for cough, chest tightness, shortness of breath, wheezing and stridor.   Cardiovascular: Negative for chest pain, palpitations and leg swelling.  Gastrointestinal: Negative for nausea, vomiting, abdominal pain, diarrhea, constipation, blood in stool, abdominal distention and anal bleeding.  Genitourinary: Negative for dysuria, hematuria, flank pain and difficulty urinating.  Musculoskeletal: Negative for myalgias, back pain, joint swelling, arthralgias and gait  problem.  Skin: Positive for color change and wound. Negative for pallor and rash.  Neurological: Negative for dizziness, tremors, weakness and light-headedness.  Hematological: Negative for adenopathy. Does not bruise/bleed easily.  Psychiatric/Behavioral: Negative for behavioral problems, confusion, sleep disturbance, dysphoric mood, decreased concentration and agitation.       Objective:   Physical Exam  Constitutional: He is oriented to person, place, and time. He appears well-developed. No distress.  HENT:  Head: Normocephalic and atraumatic.  Mouth/Throat: Oropharynx is clear and moist. No oropharyngeal exudate.  Eyes: Conjunctivae and EOM are normal. Pupils are equal, round, and reactive to light. No scleral icterus.  Neck: Normal range of motion. Neck supple. No JVD present.  Cardiovascular: Normal rate, regular rhythm and normal heart sounds.  Exam reveals no gallop and no friction rub.   No murmur heard. Pulmonary/Chest: Effort normal and breath sounds normal. No respiratory distress. He has no wheezes. He has no rales. He exhibits no tenderness.  Abdominal: He exhibits no distension and no mass. There is no tenderness. There is no rebound and no guarding.  Musculoskeletal: He exhibits no edema and no tenderness.       Legs: Lymphadenopathy:    He has no cervical adenopathy.  Neurological: He is alert and oriented to person, place, and time. He has normal reflexes. He exhibits normal muscle tone. Coordination normal.  Skin: Rash noted. He is not diaphoretic. No erythema. There is pallor.  Psychiatric: He has a normal mood and affect. His behavior is normal. Judgment and thought content normal.          Assessment & Plan:   #1 Right sided prosthetic joint infection: will extend to 2 if not 3 months, could even try to push for 6 months but will be an enormously long IV course. Will discuss with Dr Charlann Boxer if he thinks pt could undergo resection arthroplasty with removal of  prosthesis by 6 weeks of antibiotics to try to effect cure. Otherwise ultimately will have to come off systemic antibiotics and has hazard the risk of recurrence of infection.    #2 hx of MDR organisms including Stenotrophomonas in opposite leg that underwent AKA  #3 Anxiety: pt asked for anxiolytic but I encouraged him to ask pCP for help with this

## 2012-08-06 NOTE — Telephone Encounter (Signed)
IV Cefepime 2gm changed from BID dosing to TID dosing during office visit.  [facility] pharmacy made aware.  Pt also hit his PICC site on the car door, had small amount of bleeding at suture site and a tear in the protective cover.  [facility] RN will come by to change the dressing. Andree Coss, RN

## 2012-08-10 ENCOUNTER — Ambulatory Visit (INDEPENDENT_AMBULATORY_CARE_PROVIDER_SITE_OTHER): Payer: Medicare Other | Admitting: Internal Medicine

## 2012-08-10 DIAGNOSIS — Z7901 Long term (current) use of anticoagulants: Secondary | ICD-10-CM

## 2012-08-17 ENCOUNTER — Ambulatory Visit (INDEPENDENT_AMBULATORY_CARE_PROVIDER_SITE_OTHER): Payer: Medicare Other | Admitting: Cardiovascular Disease

## 2012-08-17 DIAGNOSIS — Z7901 Long term (current) use of anticoagulants: Secondary | ICD-10-CM

## 2012-08-27 ENCOUNTER — Encounter (HOSPITAL_COMMUNITY)
Admission: RE | Admit: 2012-08-27 | Discharge: 2012-08-27 | Disposition: A | Discharge status: Home / Self Care | Payer: Medicare Other | Source: Ambulatory Visit | Attending: Urology | Admitting: Urology

## 2012-08-27 DIAGNOSIS — C61 Malignant neoplasm of prostate: Secondary | ICD-10-CM | POA: Insufficient documentation

## 2012-08-27 MED ORDER — TECHNETIUM TC 99M MEDRONATE IV KIT
25.0000 | PACK | Freq: Once | INTRAVENOUS | Status: AC | PRN
  Administered 2012-08-27: 25 via INTRAVENOUS

## 2012-08-31 ENCOUNTER — Ambulatory Visit (INDEPENDENT_AMBULATORY_CARE_PROVIDER_SITE_OTHER): Payer: Medicare Other | Admitting: Pharmacist

## 2012-08-31 DIAGNOSIS — Z7901 Long term (current) use of anticoagulants: Secondary | ICD-10-CM

## 2012-09-07 ENCOUNTER — Encounter: Payer: Self-pay | Admitting: Internal Medicine

## 2012-09-07 ENCOUNTER — Telehealth: Payer: Self-pay | Admitting: *Deleted

## 2012-09-07 ENCOUNTER — Encounter: Payer: Self-pay | Admitting: Infectious Disease

## 2012-09-07 ENCOUNTER — Ambulatory Visit (INDEPENDENT_AMBULATORY_CARE_PROVIDER_SITE_OTHER): Payer: Medicare Other | Admitting: Infectious Disease

## 2012-09-07 VITALS — BP 125/67 | HR 72 | Temp 97.7°F | Wt 215.0 lb

## 2012-09-07 DIAGNOSIS — A498 Other bacterial infections of unspecified site: Secondary | ICD-10-CM

## 2012-09-07 DIAGNOSIS — S78119A Complete traumatic amputation at level between unspecified hip and knee, initial encounter: Secondary | ICD-10-CM

## 2012-09-07 DIAGNOSIS — M009 Pyogenic arthritis, unspecified: Secondary | ICD-10-CM

## 2012-09-07 DIAGNOSIS — Z89612 Acquired absence of left leg above knee: Secondary | ICD-10-CM

## 2012-09-07 DIAGNOSIS — B965 Pseudomonas (aeruginosa) (mallei) (pseudomallei) as the cause of diseases classified elsewhere: Secondary | ICD-10-CM

## 2012-09-07 DIAGNOSIS — Z5189 Encounter for other specified aftercare: Secondary | ICD-10-CM

## 2012-09-07 NOTE — Telephone Encounter (Signed)
Per Dr Daiva Eves called [facility] to have them extend the patient IV therapy for 2 months or until he has his follow up appt with Dr Daiva Eves. Gave the order to Debbie who will make the necessary documentation.

## 2012-09-07 NOTE — Progress Notes (Signed)
Subjective:    Patient ID: Derek Frye, male    DOB: [birth year], 72 y.o.   MRN: [id number]  HPI   72  yo man who had prior infection of left TKA in 2005 apparently managed by Dr. Roxan Hockey at that time. According to the wife and the pt had infection due to a cat scratch. 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 Roxan Hockey or of isolation of Pasteurella from culture. IN any case he had recurrent infection in his left knee this July 2011after he had bout of cellulitis that originated with ulcers in lower extremity. He was treated by the hospitalist team with broad spectrum antibiotics. Ultimately he underwent I and D with resection arthroplasty and placement of antibiotic spacer on 09/28/09. Intraoperative cultures grew Diphtheroids, though one culture was initially read as CNS and the other as having GNR.He was placed on IV Vancomycin and treated for nearly 12 weeks with IV vancomycin and seen in [place] by Dr. Charlann Boxer who aspirated knee in September 2011 and encountered wbc of 1800 on aspiraate without any organisms growing. I saw him this fall and put him on doxycyline. I did a swab culture of surface exudate from on of his wound which grew proteus but I considered it a skin contaminant. In the interim hedeveloped recurrenc of infection in his knee and uderwent yet again I and D and removal of antibiotic spacer by Dr Charlann Boxer. Cultures now grew a pure GBS. He was changed to rocephintransitioned to doxycycline and levofloxacin which he remains on until I saw him the last time. Marland Kitchen He has continued to have a great deal of difficulty healing the open ulcers on his feet In particular the one on his heel which was cultured by podiatry and which yielded Stenotrophomonas which was R to bactrim, levaquin but s to minocycline. I cultured material from one of these lesions and it again yielded S. Maltophila that was now I to minocycline but R to all other antibiotics tested.   He  ultimately underwent left AKA on 102412  He recently developed right sided prosthetic knee infection this May and underwent I and D by Dr. Charlann Boxer on [phone] with polyexchange. Cultures unfortunately grew Pseudomonas Aeruginosa R to FQ, currently on IV cefepime 2 g iv q 12 hours.  He has had problems with erythema and irritation near the operative site which he and his wife attribute to the wound being too vigorously removed from the site and I saw him in May for [facility].  He is ain a difficult predicament and I had discussed case with Dr Charlann Boxer. There are ZERO oral options for suppressive therapy. Repeat surgery to explant the prosthethesis is difficult and reimplantation yet another surgery that would be flanked by him being bed bound etc, so we have decided to try to push protracted IV abx beyond the usual 6-8 weeks and with further discussion with pt and wife will attempt to get him to 4 months postop. He is with [facility] less pain and can bear weight without immediate pain. No fevers, chills or malaise and site near knee is healing.  Review of Systems  Constitutional: Negative for fever, chills, diaphoresis, activity change, appetite change, fatigue and unexpected weight change.  HENT: Negative for congestion, sore throat, rhinorrhea, sneezing, trouble swallowing and sinus pressure.   Eyes: Negative for photophobia and visual disturbance.  Respiratory: Negative for cough, chest tightness, shortness of breath, wheezing and stridor.   Cardiovascular: Negative for chest  pain, palpitations and leg swelling.  Gastrointestinal: Negative for nausea, vomiting, abdominal pain, diarrhea, constipation, blood in stool, abdominal distention and anal bleeding.  Genitourinary: Negative for dysuria, hematuria, flank pain and difficulty urinating.  Musculoskeletal: Negative for myalgias, back pain, joint swelling, arthralgias and gait problem.  Skin: Positive for color change and wound. Negative for pallor and rash.    Neurological: Negative for dizziness, tremors, weakness and light-headedness.  Hematological: Negative for adenopathy. Does not bruise/bleed easily.  Psychiatric/Behavioral: Negative for behavioral problems, confusion, sleep disturbance, dysphoric mood, decreased concentration and agitation.       Objective:   Physical Exam  Constitutional: He is oriented to person, place, and time. He appears well-developed. No distress.  HENT:  Head: Normocephalic and atraumatic.  Mouth/Throat: Oropharynx is clear and moist. No oropharyngeal exudate.  Eyes: Conjunctivae and EOM are normal. Pupils are equal, round, and reactive to light. No scleral icterus.  Neck: Normal range of motion. Neck supple. No JVD present.  Cardiovascular: Normal rate, regular rhythm and normal heart sounds.  Exam reveals no gallop and no friction rub.   No murmur heard. Pulmonary/Chest: Effort normal and breath sounds normal. No respiratory distress. He has no wheezes. He has no rales. He exhibits no tenderness.  Abdominal: He exhibits no distension and no mass. There is no tenderness. There is no rebound and no guarding.  Musculoskeletal: He exhibits no edema and no tenderness.       Legs: Lymphadenopathy:    He has no cervical adenopathy.  Neurological: He is alert and oriented to person, place, and time. He has normal reflexes. He exhibits normal muscle tone. Coordination normal.  Skin: Rash noted. He is not diaphoretic. No erythema. There is pallor.  Psychiatric: He has a normal mood and affect. His behavior is normal. Judgment and thought content normal.          Assessment & Plan:   #1 Right sided prosthetic joint infection: will push another 2 months to get to 4 months at least, he is on coumadin making me less anxious for PICC associated DVT   #2 Pseudomonas : see above  #3  hx of MDR organisms including Stenotrophomonas in opposite leg that underwent AKA

## 2012-09-10 ENCOUNTER — Ambulatory Visit: Payer: Medicare Other | Admitting: Infectious Disease

## 2012-09-18 ENCOUNTER — Telehealth: Payer: Self-pay | Admitting: *Deleted

## 2012-09-18 NOTE — Telephone Encounter (Signed)
Patient's wife called c/o patient having side effects from the antibiotic. He is extremely agitated and overall not feeling well. She requested he be seen and given appt for Tuesday 09/22/12 with Dr. Drue Second. Wendall Mola

## 2012-09-21 ENCOUNTER — Ambulatory Visit (INDEPENDENT_AMBULATORY_CARE_PROVIDER_SITE_OTHER): Payer: Medicare Other | Admitting: *Deleted

## 2012-09-21 DIAGNOSIS — Z7901 Long term (current) use of anticoagulants: Secondary | ICD-10-CM

## 2012-09-21 LAB — POCT INR: INR: 2.8

## 2012-09-22 ENCOUNTER — Ambulatory Visit (INDEPENDENT_AMBULATORY_CARE_PROVIDER_SITE_OTHER): Payer: Medicare Other | Admitting: Internal Medicine

## 2012-09-22 ENCOUNTER — Encounter: Payer: Self-pay | Admitting: Internal Medicine

## 2012-09-22 VITALS — BP 126/68 | HR 54 | Temp 98.6°F

## 2012-09-22 DIAGNOSIS — F411 Generalized anxiety disorder: Secondary | ICD-10-CM

## 2012-09-22 DIAGNOSIS — T889XXS Complication of surgical and medical care, unspecified, sequela: Secondary | ICD-10-CM

## 2012-09-22 DIAGNOSIS — R259 Unspecified abnormal involuntary movements: Secondary | ICD-10-CM

## 2012-09-22 DIAGNOSIS — R251 Tremor, unspecified: Secondary | ICD-10-CM

## 2012-09-22 DIAGNOSIS — T8450XS Infection and inflammatory reaction due to unspecified internal joint prosthesis, sequela: Secondary | ICD-10-CM

## 2012-09-22 DIAGNOSIS — A498 Other bacterial infections of unspecified site: Secondary | ICD-10-CM

## 2012-09-22 DIAGNOSIS — B965 Pseudomonas (aeruginosa) (mallei) (pseudomallei) as the cause of diseases classified elsewhere: Secondary | ICD-10-CM

## 2012-09-22 NOTE — Progress Notes (Signed)
[facility] NOTE  RFV: follow up for tremulousness associated with antibiotics Subjective:    Patient ID: Derek Frye, male    DOB: 10-08-1940, 72 y.o.   MRN: [id number]  HPI 72 yo man with CAD, afib, numerous surgeries "66", oa with joint replacements,  hx of prior infection of left TKA in 2005  S/p  I and D with resection arthroplasty and placement of antibiotic spacer on 09/28/09. 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  At end of April 2014, He recently developed right sided prosthetic knee infection underwent I X D by Dr. Charlann Boxer on 07/14/12 with polyexchange. Cultures isolated Pseudomonas Aeruginosa ( cipro R, cefepime 8 s, ceftaz 4 S, gent 8 I, imi 1 S, tobra <1 S), was initially on IV cefepime 2 g iv q 12 hours then increased to q8hr likely due to high MIC. He has been seeing Dr. Daiva Eves in follow-up where the plan is to attempt to do IV antibiotics for a goal of 16 wks, beyond the norm of 8 wks since there are no oral options for suppressive therapy.  The patient has noticed having tremors and "shaking inside" since taking cefepime. pcp gave ativan 0.5mg  Q 6hr PRN and beta blockers which has helped somewhat but the patient is so unsteady that he is unable to participate with physical therapy.  He is with [facility] less pain and can bear weight without immediate pain. No fevers, chills or malaise and site near knee is healing. The patient is here with his wife to see what can be done about his symptoms. He denies any pain at picc line site or difficulty with infusion  The patient doesn't recall having tremor prior to antibiotics but possibly anxiety. He states that he is dropping utensils due to his tremor. Mostly postural not resting by description.  Current Outpatient Prescriptions on File Prior to Visit  Medication Sig Dispense Refill  . dextrose 5 % SOLN 50 mL with ceFEPIme 2 G SOLR 2 g Inject 2 g into the  vein every 8 (eight) hours.  110 packet  0  . gabapentin (NEURONTIN) 100 MG capsule Take 100 mg by mouth 3 (three) times daily as needed. For phantom pain      . LORazepam (ATIVAN) 0.5 MG tablet Take 0.5 mg by mouth every 8 (eight) hours.      . magnesium gluconate (MAGONATE) 500 MG tablet Take 500 mg by mouth daily.       . metFORMIN (GLUCOPHAGE) 850 MG tablet Take 850 mg by mouth 2 (two) times daily with a meal.       . potassium chloride SA (K-DUR,KLOR-CON) 20 MEQ tablet Take 40 mEq by mouth every evening.       . simvastatin (ZOCOR) 40 MG tablet Take 1 tablet (40 mg total) by mouth at bedtime.  90 tablet  3  . warfarin (COUMADIN) 2.5 MG tablet Take 1.25-2.5 mg by mouth every evening. Takes 1.25 mg on sun, tue, thu, fri and 2.5 mg all other days      . acetaminophen (TYLENOL) 325 MG tablet Take 2 tablets (650 mg total) by mouth every 6 (six) hours as needed.      . docusate sodium (COLACE) 100 MG capsule Take 100-200 mg by mouth daily as needed. For constipation      . furosemide (LASIX) 40 MG tablet Take 40 mg by mouth every evening. Take one (1) tablet by mouth daily.      Marland Kitchen  metolazone (ZAROXOLYN) 2.5 MG tablet       . saccharomyces boulardii (FLORASTOR) 250 MG capsule Take 250 mg by mouth 2 (two) times daily.      Marland Kitchen spironolactone (ALDACTONE) 25 MG tablet Take 25 mg by mouth 2 (two) times daily. Take one (1) tablet by mouth two (2) times a day.      . traMADol (ULTRAM) 50 MG tablet Take 1-2 tablets (50-100 mg total) by mouth every 6 (six) hours as needed for pain.  80 tablet  0   No current facility-administered medications on file prior to visit.   Active Ambulatory Problems    Diagnosis Date Noted  . DYSLIPIDEMIA 11/08/2008  . Fluid overload 11/08/2008  . RBBB 11/09/2008  . AAA 11/08/2008  . PERIPHERAL VASCULAR DISEASE 12/19/2009  . CELLULITIS AND ABSCESS OF LEG EXCEPT FOOT 12/19/2009  . Pyogenic arthritis, lower leg 12/19/2009  . TOBACCO ABUSE, HX OF 11/08/2008  . STREPTOCOCCUS  INFECTION CCE & UNS SITE GROUP B 02/28/2010  . Encounter for long-term (current) use of anticoagulants 06/11/2010  . HTN (hypertension)   . Obesity   . CAD (coronary artery disease)   . Hyperlipidemia   . Cigarette smoker   . Atrial fibrillation   . Volume overload   . Right bundle branch block   . Warfarin anticoagulation   . Carotid artery disease   . 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 Gram-negative infection 11/19/2010  . Ejection fraction < 50%   . Preop cardiovascular exam   . Cellulitis and abscess of lower leg 07/07/2012  . Type II or unspecified type diabetes mellitus with peripheral circulatory disorders, uncontrolled(250.72) 07/17/2012  . Unspecified constipation 07/20/2012   Resolved Ambulatory Problems    Diagnosis Date Noted  . No Resolved Ambulatory Problems   Past Medical History  Diagnosis Date  . History of cholecystectomy   . Total knee replacement status   . Prostate cancer   . Abdominal aortic aneurysm   . Diabetes mellitus   . Myocardial infarction 1984  . Ulcer   . Peripheral vascular disease   . Heart murmur   . Arthritis    History  Substance Use Topics  . Smoking status: Former Smoker -- 0.50 packs/day for 30 years    Types: Cigarettes, Cigars    Start date: 07/07/2012  . Smokeless tobacco: Never Used  . Alcohol Use: No  family history includes Diabetes in his other and Stroke (age of onset: 46) in his father.  Review of Systems 12 point ROS is negative other than the positive pertinents listed in hpi. anxiety + tremelousness,     Objective:   Physical Exam BP 126/68  Pulse 54  Temp(Src) 98.6 F (37 C) (Oral) Physical Exam  Constitutional: He is oriented to person, place, and time. He appears well-developed and well-nourished. No distress.  Older than stated age HENT:  Mouth/Throat: Oropharynx is clear and moist. No oropharyngeal exudate.  Cardiovascular: irreg, irreg. Exam reveals no gallop and no friction rub.  No  murmur heard.  Pulmonary/Chest: Effort normal and breath sounds normal. No respiratory distress. He has no wheezes.  Abdominal: Soft. Bowel sounds are normal. He exhibits no distension. There is no tenderness.  Lymphadenopathy:  no cervical adenopathy.  Ext: right picc line c/d/i. Left aka. Right knee no warmth, well healed surgical incision site Neurological: He is alert and oriented to person, place, and time. Finger to nose -, postural tremor + Skin: Skin is warm and dry. No rash  noted. No erythema.  Psychiatric: He has a normal mood and affect. His behavior is normal.       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 He likely has had some symptoms prior to this recent hospitalization but now are unmasked and more evident. Unclear if it is related to the cefepime IV antibiotics. It is worth trying to switch to another antibiotic to see if he can tolerate it better, and thus participate with physical therapy. I have discussed plan with Dr. Daiva Eves and will change him from cefepime to meropenem 1gm IV Q8hr for the remaining part of his course of therapy. To have first dose at infusion center at [facility] on 7/17.   Anxiety = continue with ativan 0.5mg  Q 6hr PRN as needed, avoid if excess sedation.  Tremors = if still persists and interfere with ADL will consider referral to neurology.  rtc in 3 wk  45 min spent with patient with 50% spent in coordination of care, cousneling patient

## 2012-09-23 ENCOUNTER — Encounter (HOSPITAL_COMMUNITY): Payer: Self-pay

## 2012-09-24 ENCOUNTER — Encounter (HOSPITAL_COMMUNITY)
Admission: RE | Admit: 2012-09-24 | Discharge: 2012-09-24 | Disposition: A | Discharge status: Home / Self Care | Payer: Medicare Other | Source: Ambulatory Visit | Attending: Internal Medicine | Admitting: Internal Medicine

## 2012-09-24 ENCOUNTER — Encounter (HOSPITAL_COMMUNITY): Payer: Self-pay

## 2012-09-24 DIAGNOSIS — T8450XA Infection and inflammatory reaction due to unspecified internal joint prosthesis, initial encounter: Secondary | ICD-10-CM | POA: Insufficient documentation

## 2012-09-24 DIAGNOSIS — Z96659 Presence of unspecified artificial knee joint: Secondary | ICD-10-CM | POA: Insufficient documentation

## 2012-09-24 DIAGNOSIS — B965 Pseudomonas (aeruginosa) (mallei) (pseudomallei) as the cause of diseases classified elsewhere: Secondary | ICD-10-CM | POA: Insufficient documentation

## 2012-09-24 DIAGNOSIS — Y831 Surgical operation with implant of artificial internal device as the cause of abnormal reaction of the patient, or of later complication, without mention of misadventure at the time of the procedure: Secondary | ICD-10-CM | POA: Insufficient documentation

## 2012-09-24 HISTORY — DX: Tremor, unspecified: R25.1

## 2012-09-24 MED ORDER — SODIUM CHLORIDE 0.9 % IV SOLN
250.0000 mL | Freq: Once | INTRAVENOUS | Status: AC
  Administered 2012-09-24: 250 mL via INTRAVENOUS

## 2012-09-24 MED ORDER — HEPARIN SOD (PORK) LOCK FLUSH 100 UNIT/ML IV SOLN
250.0000 [IU] | Freq: Once | INTRAVENOUS | Status: AC
  Administered 2012-09-24: 250 [IU] via INTRAVENOUS
  Filled 2012-09-24 (×2): qty 5

## 2012-09-24 MED ORDER — SODIUM CHLORIDE 0.9 % IJ SOLN
10.0000 mL | INTRAMUSCULAR | Status: DC | PRN
  Administered 2012-09-24: 10 mL

## 2012-09-24 MED ORDER — SODIUM CHLORIDE 0.9 % IV SOLN
1.0000 g | Freq: Once | INTRAVENOUS | Status: AC
  Administered 2012-09-24: 1 g via INTRAVENOUS
  Filled 2012-09-24: qty 1

## 2012-09-24 NOTE — Progress Notes (Signed)
Uneventful infusion of 1st dose of MEROPENEM via PICC. I called Dr Ilsa Iha to inform her of this and she spoke with patient's wife and Home Health has been notified that patient will be starting this medication at home. Patient was discharged accompanied by wife

## 2012-09-24 NOTE — Progress Notes (Signed)
Patient ID: Derek Frye, male   DOB: Jul 31, 1940, 72 y.o.   MRN: [id number]  [facility]  Allergies  Allergen Reactions  . Cefepime     ? tremors  . Amoxicillin     REACTION: swelling and a rash  . Morphine Nausea And Vomiting     Chief Complaint  Patient presents with  . Acute Visit    coumadin management     HPI: He is being seen for the management of his coumadin therapy; he is on long term coumadin therapy for his chronic afib. He is presently stable without any complications present.   Past Medical History  Diagnosis Date  . HTN (hypertension)   . Obesity   . History of cholecystectomy   . CAD (coronary artery disease)     Status post CABG followed by redo in 1994 / nuclear 2007, old infarct, no ischemia  . Ejection fraction < 50%     EF  45%...echo 2004 /  EF 45%, echo, August, 2012  . Hyperlipidemia   . Cigarette smoker     He is now smoking cigars and needs to quit.  . 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 no replacement until leg is stable  . Prostate cancer     History of prostate cancer with a seed implant and this is stable.  . Abdominal aortic aneurysm     is stable, being followed  . Atrial fibrillation     Rate control / going in and out of atrial fib, Coumadin  . Volume overload   . Right bundle branch block     noted November 09, 2008.  . Warfarin anticoagulation     Atrial fibrillation  . Carotid artery disease     Doppler September 2 009, 40-50% LICA / Doppler October, 2010, 0-39% R. ICA 40-59% LICA  . Diabetes mellitus   . Myocardial infarction 1984  . Ulcer   . Peripheral vascular disease   . 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 Heart murmur   . Arthritis   . Tremor     ? caused via by antibiotic currently taking    Past Surgical History  Procedure Laterality Date  . Total knee arthroplasty    . Coronary artery bypass graft  1994    x2  . Anal fissure repair     . Pr vein bypass graft,aorto-fem-pop    . Leg amputation above knee  Oct. 23, 2012    Left Leg AKA  . Joint replacement      bil knee  . Abdominal aortic endovascular stent graft N/A 04/30/2012    Procedure:  Ultrasound guided,   inserstion of  ABDOMINAL AORTIC ENDOVASCULAR STENT GRAFT;  Surgeon: Nada Libman, MD;  Location: [facility] OR;  Service: Vascular;  Laterality: N/A;  EVAR- Gore  . I&d knee with poly exchange Right 07/13/2012    Procedure: IRRIGATION AND DEBRIDEMENT KNEE WITH POLY EXCHANGE;  Surgeon: Shelda Pal, MD;  Location: [facility] ORS;  Service: Orthopedics;  Laterality: Right;    VITAL SIGNS BP 121/64  Pulse 67  Ht 5\' 11"  (1.803 m)  Wt 210 lb (95.255 kg)  BMI 29.3 kg/m2   Patient's Medications  New Prescriptions   No medications on file  Previous Medications   ACETAMINOPHEN (TYLENOL) 325 MG TABLET    Take 2 tablets (650 mg total) by mouth every 6 (six) hours as needed.  DEXTROSE 5 % SOLN 50 ML WITH CEFEPIME 2 G SOLR 2 G    Inject 2 g into the vein every 8 (eight) hours.   DOCUSATE SODIUM (COLACE) 100 MG CAPSULE    Take 100-200 mg by mouth daily as needed. For constipation   FUROSEMIDE (LASIX) 40 MG TABLET    Take 40 mg by mouth every evening. Take one (1) tablet by mouth daily.   GABAPENTIN (NEURONTIN) 100 MG CAPSULE    Take 100 mg by mouth 3 (three) times daily as needed. For phantom pain   LORAZEPAM (ATIVAN) 0.5 MG TABLET    Take 0.5 mg by mouth every 8 (eight) hours.   MAGNESIUM GLUCONATE (MAGONATE) 500 MG TABLET    Take 500 mg by mouth daily.    METFORMIN (GLUCOPHAGE) 850 MG TABLET    Take 850 mg by mouth 2 (two) times daily with a meal.    METOLAZONE (ZAROXOLYN) 2.5 MG TABLET       POTASSIUM CHLORIDE SA (K-DUR,KLOR-CON) 20 MEQ TABLET    Take 40 mEq by mouth every evening.    SACCHAROMYCES BOULARDII (FLORASTOR) 250 MG CAPSULE    Take 250 mg by mouth 2 (two) times daily.   SIMVASTATIN (ZOCOR) 40 MG TABLET    Take 1 tablet (40 mg total) by mouth at bedtime.    SPIRONOLACTONE (ALDACTONE) 25 MG TABLET    Take 25 mg by mouth 2 (two) times daily. Take one (1) tablet by mouth two (2) times a day.   TRAMADOL (ULTRAM) 50 MG TABLET    Take 1-2 tablets (50-100 mg total) by mouth every 6 (six) hours as needed for pain.   WARFARIN (COUMADIN) 2.5 MG TABLET    Take 1.25-2.5 mg by mouth every evening. Takes 1.25 mg on sun, tue, thu, fri and 2.5 mg all other days  Modified Medications   No medications on file  Discontinued Medications   No medications on file    SIGNIFICANT DIAGNOSTIC EXAMS     Component Value Date/Time   ALBUMIN 3.4* 07/07/2012 1500   AST 19 07/07/2012 1500   ALT 13 07/07/2012 1500   ALKPHOS 114 07/07/2012 1500   BILITOT 1.3* 07/07/2012 1500       Component Value Date/Time   BUN 20 07/16/2012 0518   GLUCOSE 135* 07/16/2012 0518   CREATININE 0.96 07/16/2012 0518   CREATININE 1.00 03/20/2012 1432   K 3.8 07/16/2012 0518   NA 136 07/16/2012 0518   TSH 0.696 06/19/2010 1433       Component Value Date/Time   WBC 11.6* 07/16/2012 0518   RBC 3.33* 07/16/2012 0518   HGB 9.2* 07/16/2012 0518   HCT 28.5* 07/16/2012 0518   PLT 274 07/16/2012 0518   MCV 85.6 07/16/2012 0518     .Review of Systems  Constitutional: Negative for fever.  Respiratory: Negative for cough and sputum production.   Cardiovascular: Negative for chest pain and palpitations.  Gastrointestinal: Negative for heartburn, abdominal pain and diarrhea.  Musculoskeletal: Negative for joint pain.  Skin: Negative.   Neurological: Negative for headaches.       Physical Exam  Constitutional: He is oriented to person, place, and time. He appears well-developed and well-nourished.  Obese   Neck: Neck supple.  Cardiovascular: Normal rate, regular rhythm and intact distal pulses.   Respiratory: Effort normal and breath sounds normal.  GI: Soft. Bowel sounds are normal.  Musculoskeletal: Normal range of motion. He exhibits no edema.  Is status post left aka  Neurological: He is  alert and  oriented to person, place, and time.  Skin: Skin is warm and dry.  Incision line without signs of infection present Right lower leg skin is discolored due to pvd  Psychiatric: He has a normal mood and affect.      ASSESSMENT/ PLAN:  Atrial fibrillation He remains stable will continue coumadin therapy and will continue to monitor his status   Warfarin anticoagulation For inr 2.4 will conitnue coumadin 1.25 mg sun;  tues; thurs; fri; and 2.5 mg on sat; mon; wed and will check inr on 07-31-12

## 2012-09-24 NOTE — Assessment & Plan Note (Signed)
For inr 2.4 will conitnue coumadin 1.25 mg sun;  tues; thurs; fri; and 2.5 mg on sat; mon; wed and will check inr on 07-31-12

## 2012-09-24 NOTE — Assessment & Plan Note (Signed)
He remains stable will continue coumadin therapy and will continue to monitor his status

## 2012-10-05 ENCOUNTER — Ambulatory Visit (INDEPENDENT_AMBULATORY_CARE_PROVIDER_SITE_OTHER): Payer: Medicare Other | Admitting: Cardiology

## 2012-10-05 DIAGNOSIS — Z7901 Long term (current) use of anticoagulants: Secondary | ICD-10-CM

## 2012-10-05 LAB — POCT INR: INR: 2.1

## 2012-10-07 ENCOUNTER — Ambulatory Visit: Payer: Medicare Other | Admitting: Infectious Disease

## 2012-10-13 ENCOUNTER — Encounter: Payer: Self-pay | Admitting: Internal Medicine

## 2012-10-13 ENCOUNTER — Encounter: Payer: Self-pay | Admitting: Infectious Disease

## 2012-10-14 ENCOUNTER — Other Ambulatory Visit: Payer: Self-pay

## 2012-10-14 ENCOUNTER — Encounter: Payer: Self-pay | Admitting: Cardiology

## 2012-10-14 ENCOUNTER — Telehealth: Payer: Self-pay | Admitting: *Deleted

## 2012-10-14 NOTE — Telephone Encounter (Signed)
We are pushing for 4 if not 6 months of total therapy--if pt can tolerate it

## 2012-10-14 NOTE — Telephone Encounter (Signed)
[facility] to fax back the original IV ABX order when changed ABX to add "duration of therapy."

## 2012-10-14 NOTE — Telephone Encounter (Signed)
Duration of therapy information faxed to [facility].

## 2012-10-14 NOTE — Telephone Encounter (Signed)
Derek Frye, this is actually kees' patient who wanted to treat him for an extended period of time. See the note prior to my visit. i saw the patient as a courtesy since he was intolerant of the cefepime and changed to meropenem. The original course of therapy as outlined by kees should still stand

## 2012-10-19 ENCOUNTER — Ambulatory Visit (INDEPENDENT_AMBULATORY_CARE_PROVIDER_SITE_OTHER): Payer: Medicare Other | Admitting: Internal Medicine

## 2012-10-19 DIAGNOSIS — Z7901 Long term (current) use of anticoagulants: Secondary | ICD-10-CM

## 2012-10-22 ENCOUNTER — Ambulatory Visit (INDEPENDENT_AMBULATORY_CARE_PROVIDER_SITE_OTHER): Payer: Medicare Other | Admitting: Internal Medicine

## 2012-10-22 ENCOUNTER — Encounter: Payer: Self-pay | Admitting: Internal Medicine

## 2012-10-22 VITALS — BP 136/73 | HR 50 | Temp 98.2°F

## 2012-10-22 DIAGNOSIS — T889XXS Complication of surgical and medical care, unspecified, sequela: Secondary | ICD-10-CM

## 2012-10-22 DIAGNOSIS — T8450XS Infection and inflammatory reaction due to unspecified internal joint prosthesis, sequela: Secondary | ICD-10-CM

## 2012-10-22 LAB — C-REACTIVE PROTEIN: CRP: 4.2 mg/dL — ABNORMAL HIGH (ref <0.60)

## 2012-10-22 NOTE — Progress Notes (Signed)
[facility] NOTE  RFV: chronic prosthetic joint infection with PsA  Subjective:    Patient ID: Derek Frye, male    DOB: [birth year], 72 y.o.   MRN: [id number]  HPI HPI 72 yo man with CAD, afib, numerous surgeries "16", oa with joint replacements, hx of prior infection of left TKA in 2005 S/p I and D with resection arthroplasty and placement of antibiotic spacer on 09/28/09. 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  At end of April 2014, He recently developed right sided prosthetic knee infection underwent I X D by Dr. Charlann Boxer on 07/14/12 with polyexchange. Cultures isolated Pseudomonas Aeruginosa ( cipro R, cefepime 8 s, ceftaz 4 S, gent 8 I, imi 1 S, tobra <1 S), was initially on IV cefepime 2 g iv q 12 hours then increased to q8hr started on 5/08 likely due to high MIC. He has been seeing Dr. Daiva Eves in follow-up where the plan is to attempt to do IV antibiotics for a goal of 16 wks, beyond the norm of 8 wks since there are no oral options for suppressive therapy. Patient started with cefepime but had tremors and "shaking inside" since taking cefepime. pcp gave ativan 0.5mg  Q 6hr PRN and beta blockers which has helped somewhat but the patient is so unsteady that he is unable to participate with physical therapy. He  Changed to meropenem on 7/17 tolerating it well for the past 4 wks. No fever or chills. Knee does not feel particular swollen or warm to touch.  Current Outpatient Prescriptions on File Prior to Visit  Medication Sig Dispense Refill  . acetaminophen (TYLENOL) 325 MG tablet Take 2 tablets (650 mg total) by mouth every 6 (six) hours as needed.      . docusate sodium (COLACE) 100 MG capsule Take 100-200 mg by mouth daily as needed. For constipation      . furosemide (LASIX) 40 MG tablet Take 40 mg by mouth every evening. Take one (1) tablet by mouth daily.      Marland Kitchen gabapentin (NEURONTIN) 100 MG capsule Take  100 mg by mouth 3 (three) times daily as needed. For phantom pain      . LORazepam (ATIVAN) 0.5 MG tablet Take 0.5 mg by mouth every 8 (eight) hours.      . magnesium gluconate (MAGONATE) 500 MG tablet Take 500 mg by mouth daily.       . metFORMIN (GLUCOPHAGE) 850 MG tablet Take 850 mg by mouth 2 (two) times daily with a meal.       . metolazone (ZAROXOLYN) 2.5 MG tablet       . potassium chloride SA (K-DUR,KLOR-CON) 20 MEQ tablet Take 40 mEq by mouth every evening.       . saccharomyces boulardii (FLORASTOR) 250 MG capsule Take 250 mg by mouth 2 (two) times daily.      . simvastatin (ZOCOR) 40 MG tablet Take 1 tablet (40 mg total) by mouth at bedtime.  90 tablet  3  . spironolactone (ALDACTONE) 25 MG tablet Take 25 mg by mouth 2 (two) times daily. Take one (1) tablet by mouth two (2) times a day.      . warfarin (COUMADIN) 2.5 MG tablet Take 1.25-2.5 mg by mouth every evening. Takes 1.25 mg on sun, tue, thu, fri and 2.5 mg all other days       No current facility-administered medications on file prior to visit.   Active Ambulatory Problems  Diagnosis Date Noted  . DYSLIPIDEMIA 11/08/2008  . Fluid overload 11/08/2008  . RBBB 11/09/2008  . AAA 11/08/2008  . PERIPHERAL VASCULAR DISEASE 12/19/2009  . CELLULITIS AND ABSCESS OF LEG EXCEPT FOOT 12/19/2009  . Pyogenic arthritis, lower leg 12/19/2009  . TOBACCO ABUSE, HX OF 11/08/2008  . STREPTOCOCCUS INFECTION CCE & UNS SITE GROUP B 02/28/2010  . Encounter for long-term (current) use of anticoagulants 06/11/2010  . HTN (hypertension)   . Obesity   . CAD (coronary artery disease)   . Hyperlipidemia   . Cigarette smoker   . Atrial fibrillation   . Volume overload   . Right bundle branch block   . Warfarin anticoagulation   . Carotid artery disease   . 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 Gram-negative infection 11/19/2010  . Ejection fraction < 50%   . Preop cardiovascular exam   . Cellulitis and abscess of lower leg  07/07/2012  . Type II or unspecified type diabetes mellitus with peripheral circulatory disorders, uncontrolled(250.72) 07/17/2012  . Unspecified constipation 07/20/2012  . Bradycardia    Resolved Ambulatory Problems    Diagnosis Date Noted  . No Resolved Ambulatory Problems   Past Medical History  Diagnosis Date  . History of cholecystectomy   . Total knee replacement status   . Prostate cancer   . Abdominal aortic aneurysm   . Diabetes mellitus   . Myocardial infarction 1984  . Ulcer   . Peripheral vascular disease   . Heart murmur   . Arthritis   . Tremor       Review of Systems No fever, chills, nightsweats, no swelling.    Objective:   Physical Exam BP 136/73  Pulse 50  Temp(Src) 98.2 F (36.8 C) (Oral) Physical Exam  Constitutional: He is oriented to person, place, and time. He appears well-developed and well-nourished. No distress.  HENT:  Mouth/Throat: Oropharynx is clear and moist. No oropharyngeal exudate.  Cardiovascular: Normal rate, regular rhythm and normal heart sounds. Exam reveals no gallop and no friction rub.  No murmur heard.  Pulmonary/Chest: Effort normal and breath sounds normal. No respiratory distress. He has no wheezes.  Abdominal: Soft. Bowel sounds are normal. He exhibits no distension. There is no tenderness.  Lymphadenopathy:  He has no cervical adenopathy.  Neurological: He is alert and oriented to person, place, and time.  Ext: left aka, and right leg -> knee warm to touch Skin: Skin is warm and dry. No rash noted. No erythema.  Psychiatric: He has a normal mood and affect. His behavior is normal.        Assessment & Plan:   PsA prosthetic joint infection = continue with antibiotics and return in 4 wks. Will check sed rate and crp to see if he is having appropriate decreases. If it is elevated, will discuss with dr. Charlann Boxer if need to consider re-aspiration off of antibiotics vs. Other source that could cause increase in his  inflammatory markers? Gout.

## 2012-10-23 LAB — SEDIMENTATION RATE: Sed Rate: 116 mm/hr — ABNORMAL HIGH (ref 0–16)

## 2012-10-27 ENCOUNTER — Encounter: Payer: Self-pay | Admitting: Internal Medicine

## 2012-10-28 ENCOUNTER — Telehealth: Payer: Self-pay | Admitting: *Deleted

## 2012-10-28 ENCOUNTER — Encounter: Payer: Self-pay | Admitting: Internal Medicine

## 2012-10-28 NOTE — Telephone Encounter (Signed)
Pt's wife calling for lab results from his last visit and plan of action. Per Dr. Drue Second, pt's labs are elevated and he is to continue therapy as is.  Dr. Drue Second will reassess at his next appointment 11/09/12. Pt and wife verbalize understanding. Andree Coss, RN

## 2012-10-30 ENCOUNTER — Encounter: Payer: Self-pay | Admitting: Cardiology

## 2012-11-02 ENCOUNTER — Encounter: Payer: Self-pay | Admitting: Cardiology

## 2012-11-02 ENCOUNTER — Ambulatory Visit (INDEPENDENT_AMBULATORY_CARE_PROVIDER_SITE_OTHER): Payer: Medicare Other | Admitting: *Deleted

## 2012-11-02 ENCOUNTER — Ambulatory Visit (INDEPENDENT_AMBULATORY_CARE_PROVIDER_SITE_OTHER): Payer: Medicare Other | Admitting: Cardiology

## 2012-11-02 VITALS — BP 120/56 | HR 47 | Wt 215.0 lb

## 2012-11-02 DIAGNOSIS — I498 Other specified cardiac arrhythmias: Secondary | ICD-10-CM

## 2012-11-02 DIAGNOSIS — I714 Abdominal aortic aneurysm, without rupture: Secondary | ICD-10-CM

## 2012-11-02 DIAGNOSIS — I2510 Atherosclerotic heart disease of native coronary artery without angina pectoris: Secondary | ICD-10-CM

## 2012-11-02 DIAGNOSIS — M009 Pyogenic arthritis, unspecified: Secondary | ICD-10-CM

## 2012-11-02 DIAGNOSIS — I4891 Unspecified atrial fibrillation: Secondary | ICD-10-CM

## 2012-11-02 DIAGNOSIS — R001 Bradycardia, unspecified: Secondary | ICD-10-CM | POA: Insufficient documentation

## 2012-11-02 DIAGNOSIS — Z7901 Long term (current) use of anticoagulants: Secondary | ICD-10-CM

## 2012-11-02 NOTE — Assessment & Plan Note (Signed)
Patient has chronic atrial fibrillation. He is on Coumadin. His rate is slow. He is not on any medications for rate control. He's not having symptoms. No change in therapy.

## 2012-11-02 NOTE — Assessment & Plan Note (Addendum)
Patient continues on his Coumadin for anticoagulation. No change in therapy.  As part of today's evaluation I spent greater than 25 minutes on is total care. More than half of this time was spent with direct contact with the patient and his family. In addition I have reviewed extensive records from his hospitalization and office visits with other physicians.

## 2012-11-02 NOTE — Assessment & Plan Note (Signed)
I am concerned that his underlying heart rate is slow. This is not new. There no symptoms.

## 2012-11-02 NOTE — Patient Instructions (Addendum)
**Note De-identified Derek Frye Obfuscation** Your physician recommends that you continue on your current medications as directed. Please refer to the Current Medication list given to you today.  Your physician wants you to follow-up in: 6 months. You will receive a reminder letter in the mail two months in advance. If you don't receive a letter, please call our office to schedule the follow-up appointment.  

## 2012-11-02 NOTE — Assessment & Plan Note (Signed)
Unfortunately he is receiving ongoing therapy with IV medications for his infected knee prosthesis.

## 2012-11-02 NOTE — Progress Notes (Signed)
HPI  Patient is seen today to followup his coronary artery disease. I had seen him last December, 2013. Prior to that he had been cleared for his left leg amputation. In January, 2014 it became clear that he needed his abdominal aortic aneurysm repaired. A nuclear stress study was done April 13, 2012. It was not gated. There was an old large lateral scar. There was no significant ischemia. Ultimately he had endovascular aneurysm repair. He is followed very carefully by Dr. Myra Gianotti. It is my understanding that there is an endoleak that is followed very carefully.  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 He is followed very carefully by infectious disease. His family relates to me today that he had a period of significant agitation and tremor. Ultimately this may have been related to the antibiotic at that time. The antibiotic has been switched and he is much better.  He is not having chest pain or shortness of breath at this time.  The patient has atrial fibrillation. His rate is slow. He is not having symptoms from this. He is not on any medications affecting his heart rate at this time.  Allergies  Allergen Reactions  . Cefepime     ? tremors  . Amoxicillin     REACTION: swelling and a rash  . Morphine Nausea And Vomiting    Current Outpatient Prescriptions  Medication Sig Dispense Refill  . acetaminophen (TYLENOL) 325 MG tablet Take 2 tablets (650 mg total) by mouth every 6 (six) hours as needed.      . docusate sodium (COLACE) 100 MG capsule Take 100-200 mg by mouth daily as needed. For constipation      . furosemide (LASIX) 40 MG tablet Take 40 mg by mouth every evening. Take one (1) tablet by mouth daily.      Marland Kitchen gabapentin (NEURONTIN) 100 MG capsule Take 100 mg by mouth 3 (three) times daily as needed. For phantom pain      . LORazepam (ATIVAN) 0.5 MG tablet Take 0.5 mg by mouth every 8 (eight) hours.      . magnesium gluconate (MAGONATE) 500 MG  tablet Take 500 mg by mouth daily.       . metFORMIN (GLUCOPHAGE) 850 MG tablet Take 850 mg by mouth 2 (two) times daily with a meal.       . metolazone (ZAROXOLYN) 2.5 MG tablet       . potassium chloride SA (K-DUR,KLOR-CON) 20 MEQ tablet Take 40 mEq by mouth every evening.       . saccharomyces boulardii (FLORASTOR) 250 MG capsule Take 250 mg by mouth 2 (two) times daily.      . simvastatin (ZOCOR) 40 MG tablet Take 1 tablet (40 mg total) by mouth at bedtime.  90 tablet  3  . spironolactone (ALDACTONE) 25 MG tablet Take 25 mg by mouth 2 (two) times daily. Take one (1) tablet by mouth two (2) times a day.      . traMADol (ULTRAM) 50 MG tablet Take 1-2 tablets (50-100 mg total) by mouth every 6 (six) hours as needed for pain.  80 tablet  0  . warfarin (COUMADIN) 2.5 MG tablet Take 1.25-2.5 mg by mouth every evening. Takes 1.25 mg on sun, tue, thu, fri and 2.5 mg all other days       No current facility-administered medications for this visit.    History   Social History  . Marital Status: Married    Spouse Name: N/A  Number of Children: N/A  . Years of Education: N/A   Occupational History  . Not on file.   Social History Main Topics  . Smoking status: Former Smoker -- 0.50 packs/day for 30 years    Types: Cigarettes, Cigars    Start date: 07/07/2012  . Smokeless tobacco: Never Used  . Alcohol Use: No  . Drug Use: No  . Sexual Activity: Not on file   Other Topics Concern  . Not on file   Social History Narrative  . No narrative on file    Family History  Problem Relation Age of Onset  . Stroke Father 7  . Diabetes Other     Past Medical History  Diagnosis Date  . HTN (hypertension)   . Obesity   . History of cholecystectomy   . CAD (coronary artery disease)     Status post CABG followed by redo in 1994 / nuclear 2007, old infarct, no ischemia  . Ejection fraction < 50%     EF  45%...echo 2004 /  EF 45%, echo, August, 2012  . Hyperlipidemia   . Cigarette  smoker     He is now smoking cigars and needs to quit.  . 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 no replacement until leg is stable  . Prostate cancer     History of prostate cancer with a seed implant and this is stable.  . Abdominal aortic aneurysm     is stable, being followed  . Atrial fibrillation     Rate control / going in and out of atrial fib, Coumadin  . Volume overload   . Right bundle branch block     noted November 09, 2008.  . Warfarin anticoagulation     Atrial fibrillation  . Carotid artery disease     Doppler September 2 009, 40-50% LICA / Doppler October, 2010, 0-39% R. ICA 40-59% LICA  . Diabetes mellitus   . Myocardial infarction 1984  . Ulcer   . Peripheral vascular disease   . 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 Heart murmur   . Arthritis   . Tremor     ? caused via by antibiotic currently taking    Past Surgical History  Procedure Laterality Date  . Total knee arthroplasty    . Coronary artery bypass graft  1994    x2  . Anal fissure repair    . Pr vein bypass graft,aorto-fem-pop    . Leg amputation above knee  Oct. 23, 2012    Left Leg AKA  . Joint replacement      bil knee  . Abdominal aortic endovascular stent graft N/A 04/30/2012    Procedure:  Ultrasound guided,   inserstion of  ABDOMINAL AORTIC ENDOVASCULAR STENT GRAFT;  Surgeon: Nada Libman, MD;  Location: [facility];  Service: Vascular;  Laterality: N/A;  EVAR- Gore  . I&d knee with poly exchange Right 07/13/2012    Procedure: IRRIGATION AND DEBRIDEMENT KNEE WITH POLY EXCHANGE;  Surgeon: Shelda Pal, MD;  Location: [facility] ORS;  Service: Orthopedics;  Laterality: Right;    Patient Active Problem List   Diagnosis Date Noted  . Ejection fraction < 50%     Priority: High  . Unspecified constipation 07/20/2012  . Type II or unspecified type diabetes mellitus with peripheral circulatory disorders, uncontrolled(250.72) 07/17/2012   . Cellulitis and abscess of lower leg 07/07/2012  . Preop cardiovascular  exam   . 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 Gram-negative infection 11/19/2010  . HTN (hypertension)   . Obesity   . CAD (coronary artery disease)   . Hyperlipidemia   . Cigarette smoker   . Atrial fibrillation   . Volume overload   . Right bundle branch block   . Warfarin anticoagulation   . Carotid artery disease   . Encounter for long-term (current) use of anticoagulants 06/11/2010  . STREPTOCOCCUS INFECTION CCE & UNS SITE GROUP B 02/28/2010  . PERIPHERAL VASCULAR DISEASE 12/19/2009  . CELLULITIS AND ABSCESS OF LEG EXCEPT FOOT 12/19/2009  . Pyogenic arthritis, lower leg 12/19/2009  . RBBB 11/09/2008  . DYSLIPIDEMIA 11/08/2008  . Fluid overload 11/08/2008  . AAA 11/08/2008  . TOBACCO ABUSE, HX OF 11/08/2008    ROS   Patient denies fever, chills, headache, sweats, rash, change in vision, change in hearing, chest pain, cough, nausea vomiting, urinary symptoms. All other systems are reviewed and are negative.  PHYSICAL EXAM   He's here in a wheelchair. He's with a family member. He is overweight. He is actually not as heavy as in the past. There is no jugulovenous distention. Lungs are clear. Respiratory effort is nonlabored. Cardiac exam reveals S1 and S2. The rhythm is irregularly irregular. Abdomen is soft. He has an amputation to the left leg. I did not fully examine his right leg. There no skin rashes.  Filed Vitals:   11/02/12 1058  BP: 120/56  Pulse: 47  Weight: 215 lb (97.523 kg)   EKG is done today and reviewed by me. He has old right bundle branch block. There is old atrial fibrillation. The rate is slow. This is unchanged.  ASSESSMENT & PLAN

## 2012-11-02 NOTE — Assessment & Plan Note (Signed)
Patient has undergone endovascular repair of his abdominal aortic aneurysm. There is an and no leakage being followed carefully.

## 2012-11-02 NOTE — Assessment & Plan Note (Signed)
Coronary disease is stable. Nuclear scan in February, 2014 revealed old large scar with no ischemia.

## 2012-11-10 ENCOUNTER — Ambulatory Visit: Payer: Medicare Other | Admitting: Infectious Disease

## 2012-11-16 ENCOUNTER — Ambulatory Visit (INDEPENDENT_AMBULATORY_CARE_PROVIDER_SITE_OTHER): Payer: Medicare Other | Admitting: Cardiology

## 2012-11-16 ENCOUNTER — Telehealth: Payer: Self-pay | Admitting: Cardiology

## 2012-11-16 DIAGNOSIS — Z7901 Long term (current) use of anticoagulants: Secondary | ICD-10-CM

## 2012-11-16 NOTE — Telephone Encounter (Signed)
New Problem  Pt needs an order for ptinr// transferred to Coumadin to complete call.

## 2012-11-16 NOTE — Telephone Encounter (Signed)
[facility] RN checked pt's INR today.  See anticoagulation note in Epic.

## 2012-11-19 ENCOUNTER — Ambulatory Visit: Payer: Medicare Other | Admitting: Internal Medicine

## 2012-11-23 ENCOUNTER — Ambulatory Visit (INDEPENDENT_AMBULATORY_CARE_PROVIDER_SITE_OTHER): Payer: Medicare Other | Admitting: Internal Medicine

## 2012-11-23 ENCOUNTER — Encounter: Payer: Self-pay | Admitting: Internal Medicine

## 2012-11-23 VITALS — BP 123/68 | HR 44 | Temp 98.1°F

## 2012-11-23 DIAGNOSIS — T8450XD Infection and inflammatory reaction due to unspecified internal joint prosthesis, subsequent encounter: Secondary | ICD-10-CM

## 2012-11-23 DIAGNOSIS — Z23 Encounter for immunization: Secondary | ICD-10-CM

## 2012-11-23 DIAGNOSIS — Z5189 Encounter for other specified aftercare: Secondary | ICD-10-CM

## 2012-11-23 NOTE — Progress Notes (Signed)
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   MRN: [id number]  HPI  72 yo man with CAD, afib, numerous orthopedic surgeries for oa with joint replacements, hx of prior infection of left TKA in 2005 S/p I and D with resection arthroplasty and placement of antibiotic spacer on 09/28/09. 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   At end of April 2014, He recently developed right sided prosthetic knee infection underwent I X D by Dr. Charlann Boxer on 07/14/12 with polyexchange. Cultures isolated Pseudomonas Aeruginosa ( cipro R, cefepime 8 s, ceftaz 4 S, gent 8 I, imi 1 S, tobra <1 S), was initially on IV cefepime 2 g iv q 12 hours then increased to q8hr started on 5/08 likely due to high MIC. He did not tolerate taking the cefepime due to tremors despite being treated for anxiety with ativan 0.5mg  Q 6hr PRN and beta blockers he was  changed to meropenem on 7/17 tolerating it well for the past 8 wks , the intent was to stop antibiotics today if inflammatory markers are normalized. At his last visit in mid august, esr 116, and crp 4.2 but recently had URI.  He states that his knee has been feeling well. No difficulty with warmth, redness, or pain. No chills. Or fevers.  Current Outpatient Prescriptions on File Prior to Visit  Medication Sig Dispense Refill  . acetaminophen (TYLENOL) 325 MG tablet Take 2 tablets (650 mg total) by mouth every 6 (six) hours as needed.      . docusate sodium (COLACE) 100 MG capsule Take 100-200 mg by mouth daily as needed. For constipation      . furosemide (LASIX) 40 MG tablet Take 40 mg by mouth every evening. Take one (1) tablet by mouth daily.      Marland Kitchen gabapentin (NEURONTIN) 100 MG capsule Take 100 mg by mouth 3 (three) times daily as needed. For phantom pain      . LORazepam  (ATIVAN) 0.5 MG tablet Take 0.5 mg by mouth every 8 (eight) hours.      . magnesium gluconate (MAGONATE) 500 MG tablet Take 500 mg by mouth daily.       . meropenem (MERREM) 1 G injection Inject 1 g into the vein every 8 (eight) hours. Via PICC Line      . metFORMIN (GLUCOPHAGE) 850 MG tablet Take 850 mg by mouth 2 (two) times daily with a meal.       . metolazone (ZAROXOLYN) 2.5 MG tablet       . potassium chloride SA (K-DUR,KLOR-CON) 20 MEQ tablet Take 40 mEq by mouth every evening.       . saccharomyces boulardii (FLORASTOR) 250 MG capsule Take 250 mg by mouth 2 (two) times daily.      . simvastatin (ZOCOR) 40 MG tablet Take 1 tablet (40 mg total) by mouth at bedtime.  90 tablet  3  . spironolactone (ALDACTONE) 25 MG tablet Take 25 mg by mouth 2 (two) times daily. Take one (1) tablet by mouth two (2) times a day.      . warfarin (COUMADIN) 2.5 MG tablet Take 1.25-2.5 mg by mouth every evening. Takes 1.25 mg on sun, tue, thu, fri and 2.5 mg all other days       No current facility-administered medications on file  prior to visit.   Active Ambulatory Problems    Diagnosis Date Noted  . DYSLIPIDEMIA 11/08/2008  . Fluid overload 11/08/2008  . RBBB 11/09/2008  . AAA 11/08/2008  . PERIPHERAL VASCULAR DISEASE 12/19/2009  . CELLULITIS AND ABSCESS OF LEG EXCEPT FOOT 12/19/2009  . Pyogenic arthritis, lower leg 12/19/2009  . TOBACCO ABUSE, HX OF 11/08/2008  . STREPTOCOCCUS INFECTION CCE & UNS SITE GROUP B 02/28/2010  . Encounter for long-term (current) use of anticoagulants 06/11/2010  . HTN (hypertension)   . Obesity   . CAD (coronary artery disease)   . Hyperlipidemia   . Cigarette smoker   . Atrial fibrillation   . Volume overload   . Right bundle branch block   . Warfarin anticoagulation   . Carotid artery disease   . 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 Gram-negative infection 11/19/2010  . Ejection fraction < 50%   . Preop cardiovascular exam   . Cellulitis and  abscess of lower leg 07/07/2012  . Type II or unspecified type diabetes mellitus with peripheral circulatory disorders, uncontrolled(250.72) 07/17/2012  . Unspecified constipation 07/20/2012  . Bradycardia    Resolved Ambulatory Problems    Diagnosis Date Noted  . No Resolved Ambulatory Problems   Past Medical History  Diagnosis Date  . History of cholecystectomy   . Total knee replacement status   . Prostate cancer   . Abdominal aortic aneurysm   . Diabetes mellitus   . Myocardial infarction 1984  . Ulcer   . Peripheral vascular disease   . Heart murmur   . Arthritis   . Tremor    History  Substance Use Topics  . Smoking status: Current Every Day Smoker -- 0.50 packs/day for 30 years    Types: Cigarettes, Cigars    Start date: 07/07/2012  . Smokeless tobacco: Never Used  . Alcohol Use: No      Review of Systems Review of Systems  Constitutional: Negative for fever, chills, diaphoresis, activity change, appetite change, fatigue and unexpected weight change.  HENT: Negative for congestion, sore throat, rhinorrhea, sneezing, trouble swallowing and sinus pressure.  Eyes: Negative for photophobia and visual disturbance.  Respiratory: Negative for cough, chest tightness, shortness of breath, wheezing and stridor.  Cardiovascular: Negative for chest pain, palpitations and leg swelling.  Gastrointestinal: Negative for nausea, vomiting, abdominal pain, diarrhea, constipation, blood in stool, abdominal distention and anal bleeding.  Genitourinary: Negative for dysuria, hematuria, flank pain and difficulty urinating.  Musculoskeletal: Negative for myalgias, back pain, joint swelling, arthralgias and gait problem.  Skin: Negative for color change, pallor, rash and wound.  Neurological: Negative for dizziness, tremors, weakness and light-headedness.  Hematological: Negative for adenopathy. Does not bruise/bleed easily.  Psychiatric/Behavioral: Negative for behavioral problems,  confusion, sleep disturbance, dysphoric mood, decreased concentration and agitation.       Objective:   Physical Exam BP 123/68  Pulse 44  Temp(Src) 98.1 F (36.7 C) (Oral) Physical Exam  Constitutional: He is oriented to person, place, and time. He appears well-developed and well-nourished. No distress.  HENT:  Mouth/Throat: Oropharynx is clear and moist. No oropharyngeal exudate.  Cardiovascular: Normal rate, regular rhythm and normal heart sounds. Exam reveals no gallop and no friction rub.  No murmur heard.  Pulmonary/Chest: Effort normal and breath sounds normal. No respiratory distress. He has no wheezes.  Skin: Skin is warm and dry. No rash noted. No erythema.  Psychiatric: He has a normal mood and affect. His behavior is normal.  Ext:  Right knee= no swelling not warm, incision site completely healed. No effusion noted. Left leg has bka.  Labs: Lab Results  Component Value Date   ESRSEDRATE 76* 11/23/2012   Lab Results  Component Value Date   CRP <0.5 11/23/2012       Assessment & Plan:  Clinically looks ok but his inflammatory markers were markedly worse a month ago.His repeat labs are somewhat improved but sed rate still far from normal.  Will see if matt olin can do aspirate off of antibiotics to see if can isolate any other pathogen that still maybe causing prosthetic joint infection that is not covered by meropenem.   Will keep picc line in place, continue with herapin flush to decide if need further antibiotics  rtc 2-3 wks

## 2012-11-26 ENCOUNTER — Other Ambulatory Visit: Payer: Self-pay | Admitting: *Deleted

## 2012-11-26 ENCOUNTER — Telehealth: Payer: Self-pay | Admitting: *Deleted

## 2012-11-26 DIAGNOSIS — B951 Streptococcus, group B, as the cause of diseases classified elsewhere: Secondary | ICD-10-CM

## 2012-11-26 MED ORDER — DOXYCYCLINE HYCLATE 100 MG PO TABS: 100.0000 mg | ORAL_TABLET | Freq: Two times a day (BID) | ORAL | Status: DC

## 2012-11-26 NOTE — Telephone Encounter (Signed)
Pt would like to speak w/ Dr. Drue Second about his knee and what to do next.  [phone]

## 2012-11-27 ENCOUNTER — Other Ambulatory Visit: Payer: Self-pay | Admitting: *Deleted

## 2012-11-27 DIAGNOSIS — B951 Streptococcus, group B, as the cause of diseases classified elsewhere: Secondary | ICD-10-CM

## 2012-11-27 MED ORDER — DOXYCYCLINE HYCLATE 100 MG PO TABS: 100.0000 mg | ORAL_TABLET | Freq: Two times a day (BID) | ORAL | Status: DC

## 2012-11-30 ENCOUNTER — Ambulatory Visit (INDEPENDENT_AMBULATORY_CARE_PROVIDER_SITE_OTHER): Payer: Medicare Other | Admitting: Cardiovascular Disease

## 2012-11-30 DIAGNOSIS — Z7901 Long term (current) use of anticoagulants: Secondary | ICD-10-CM

## 2012-11-30 NOTE — Telephone Encounter (Signed)
Called the patient is he is on antibiotics done well

## 2012-12-01 ENCOUNTER — Other Ambulatory Visit: Payer: Self-pay | Admitting: Surgery

## 2012-12-01 LAB — CREATININE, SERUM: Creat: 1.01 mg/dL (ref 0.50–1.35)

## 2012-12-03 ENCOUNTER — Other Ambulatory Visit: Payer: Self-pay | Admitting: *Deleted

## 2012-12-03 DIAGNOSIS — B951 Streptococcus, group B, as the cause of diseases classified elsewhere: Secondary | ICD-10-CM

## 2012-12-03 MED ORDER — DOXYCYCLINE HYCLATE 100 MG PO TABS: 100.0000 mg | ORAL_TABLET | Freq: Two times a day (BID) | ORAL | Status: DC

## 2012-12-04 ENCOUNTER — Encounter: Payer: Self-pay | Admitting: Surgery

## 2012-12-04 ENCOUNTER — Ambulatory Visit (INDEPENDENT_AMBULATORY_CARE_PROVIDER_SITE_OTHER): Payer: Medicare Other | Admitting: Pharmacist

## 2012-12-04 DIAGNOSIS — Z7901 Long term (current) use of anticoagulants: Secondary | ICD-10-CM

## 2012-12-04 LAB — POCT INR: INR: 2.8

## 2012-12-07 ENCOUNTER — Encounter: Payer: Self-pay | Admitting: Surgery

## 2012-12-07 ENCOUNTER — Ambulatory Visit (INDEPENDENT_AMBULATORY_CARE_PROVIDER_SITE_OTHER): Payer: Medicare Other | Admitting: Surgery

## 2012-12-07 ENCOUNTER — Ambulatory Visit
Admission: RE | Admit: 2012-12-07 | Discharge: 2012-12-07 | Disposition: A | Discharge status: Home / Self Care | Payer: Medicare Other | Source: Ambulatory Visit | Attending: Surgery | Admitting: Surgery

## 2012-12-07 VITALS — BP 119/55 | HR 54 | Resp 18 | Ht 71.0 in | Wt 215.0 lb

## 2012-12-07 DIAGNOSIS — Z48812 Encounter for surgical aftercare following surgery on the circulatory system: Secondary | ICD-10-CM

## 2012-12-07 DIAGNOSIS — I714 Abdominal aortic aneurysm, without rupture: Secondary | ICD-10-CM

## 2012-12-07 MED ORDER — IOHEXOL 350 MG/ML SOLN
80.0000 mL | Freq: Once | INTRAVENOUS | Status: AC | PRN
  Administered 2012-12-07: 80 mL via INTRAVENOUS

## 2012-12-07 NOTE — Progress Notes (Signed)
[facility]   Patient name: Derek Frye MRN: [id number] DOB: [birth year] Sex: male     Chief Complaint  Patient presents with  . AAA    6 month Evar f/u  CT prior    HISTORY OF PRESENT ILLNESS: The patient is back today for followup. He is status post percutaneous endovascular repair of a 5.6 cm infrarenal abdominal aortic aneurysm on 04/30/2012. A Gore Excluder device was deployed. A type II endoleak was visualized on his initial CT scan. He is back for repeat evaluation. Since I last saw him he is undergone knee surgery which did get infected requiring home IV antibiotics. He appears to be nearly recovered from this.  Past Medical History  Diagnosis Date  . HTN (hypertension)   . Obesity   . History of cholecystectomy   . CAD (coronary artery disease)     Status post CABG followed by redo in 1994 / nuclear 2007, old infarct, no ischemia  . Ejection fraction < 50%     EF  45%...echo 2004 /  EF 45%, echo, August, 2012  . Hyperlipidemia   . Cigarette smoker     He is now smoking cigars and needs to quit.  . 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 no replacement until leg is stable  . Prostate cancer     History of prostate cancer with a seed implant and this is stable.  . Abdominal aortic aneurysm     is stable, being followed  . Atrial fibrillation     Rate control / going in and out of atrial fib, Coumadin  . Volume overload   . Right bundle branch block     noted November 09, 2008.  . Warfarin anticoagulation     Atrial fibrillation  . Carotid artery disease     Doppler September 2 009, 40-50% LICA / Doppler October, 2010, 0-39% R. ICA 40-59% LICA  . Diabetes mellitus   . Myocardial infarction 1984  . Ulcer   . Peripheral vascular disease   . 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 Heart murmur   . Arthritis   . Tremor     ? caused via by antibiotic currently taking   . Bradycardia     Past Surgical History  Procedure Laterality Date  . Total knee arthroplasty    . Coronary artery bypass graft  1994    x2  . Anal fissure repair    . Pr vein bypass graft,aorto-fem-pop    . Leg amputation above knee  Oct. 23, 2012    Left Leg AKA  . Joint replacement      bil knee  . Abdominal aortic endovascular stent graft N/A 04/30/2012    Procedure:  Ultrasound guided,   inserstion of  ABDOMINAL AORTIC ENDOVASCULAR STENT GRAFT;  Surgeon: Nada Libman, MD;  Location: [facility] OR;  Service: Vascular;  Laterality: N/A;  EVAR- Gore  . I&d knee with poly exchange Right 07/13/2012    Procedure: IRRIGATION AND DEBRIDEMENT KNEE WITH POLY EXCHANGE;  Surgeon: Shelda Pal, MD;  Location: [facility] ORS;  Service: Orthopedics;  Laterality: Right;    History   Social History  . Marital Status: Married    Spouse Name: N/A    Number of Children: N/A  . Years of Education: N/A   Occupational History  . Not on file.   Social History Main Topics  .  Smoking status: Current Every Day Smoker -- 0.50 packs/day for 30 years    Types: Cigarettes, Cigars    Start date: 07/07/2012  . Smokeless tobacco: Never Used  . Alcohol Use: No  . Drug Use: No  . Sexual Activity: Not on file   Other Topics Concern  . Not on file   Social History Narrative  . No narrative on file    Family History  Problem Relation Age of Onset  . Stroke Father 52  . Diabetes Other     Allergies as of 12/07/2012 - Review Complete 12/07/2012  Allergen Reaction Noted  . Cefepime  09/23/2012  . Amoxicillin    . Morphine Nausea And Vomiting     Current Outpatient Prescriptions on File Prior to Visit  Medication Sig Dispense Refill  . acetaminophen (TYLENOL) 325 MG tablet Take 2 tablets (650 mg total) by mouth every 6 (six) hours as needed.      . docusate sodium (COLACE) 100 MG capsule Take 100-200 mg by mouth daily as needed. For constipation      . doxycycline (VIBRA-TABS) 100 MG tablet Take 1  tablet (100 mg total) by mouth 2 (two) times daily.  180 tablet  0  . furosemide (LASIX) 40 MG tablet Take 40 mg by mouth every evening. Take one (1) tablet by mouth daily.      Marland Kitchen gabapentin (NEURONTIN) 100 MG capsule Take 100 mg by mouth 3 (three) times daily as needed. For phantom pain      . LORazepam (ATIVAN) 0.5 MG tablet Take 0.5 mg by mouth every 8 (eight) hours.      . magnesium gluconate (MAGONATE) 500 MG tablet Take 500 mg by mouth daily.       . metFORMIN (GLUCOPHAGE) 850 MG tablet Take 850 mg by mouth 2 (two) times daily with a meal.       . metolazone (ZAROXOLYN) 2.5 MG tablet daily.       . potassium chloride SA (K-DUR,KLOR-CON) 20 MEQ tablet Take 40 mEq by mouth every evening.       . saccharomyces boulardii (FLORASTOR) 250 MG capsule Take 250 mg by mouth 2 (two) times daily.      . simvastatin (ZOCOR) 40 MG tablet Take 1 tablet (40 mg total) by mouth at bedtime.  90 tablet  3  . spironolactone (ALDACTONE) 25 MG tablet Take 25 mg by mouth 2 (two) times daily. Take one (1) tablet by mouth two (2) times a day.      . warfarin (COUMADIN) 2.5 MG tablet Take 1.25-2.5 mg by mouth every evening. Takes 1.25 mg on sun, tue, thu, fri and 2.5 mg all other days      . meropenem (MERREM) 1 G injection Inject 1 g into the vein every 8 (eight) hours. Via PICC Line       No current facility-administered medications on file prior to visit.     REVIEW OF SYSTEMS: Please see history of present illness, otherwise no changes from prior visit  PHYSICAL EXAMINATION:   Vital signs are BP 119/55  Pulse 54  Resp 18  Ht 5\' 11"  (1.803 m)  Wt 215 lb (97.523 kg)  BMI 30 kg/m2 General: The patient appears their stated age. HEENT:  No gross abnormalities Pulmonary:  Non labored breathing Abdomen: Soft and non-tender Musculoskeletal: Status post left leg amputation Neurologic: No focal weakness or paresthesias are detected, Skin: There are no ulcer or rashes noted. Psychiatric: The patient has  normal affect. Cardiovascular: There  is a regular rate and rhythm without significant murmur appreciated.   Diagnostic Studies  CT angiogram was reviewed by myself today. 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 His aneurysm sac has remained stable. I discussed these findings with the patient and his wife. Because the aneurysm has not increased in size, I will repeat his CT scan in 6 months.  Jorge Ny, M.D. [facility] [place] Office: [phone] Pager:  [phone]

## 2012-12-08 NOTE — Addendum Note (Signed)
Addended by: Sharee Pimple on: 12/08/2012 10:06 AM   Modules accepted: Orders

## 2012-12-08 NOTE — Addendum Note (Signed)
Addended by: Sharee Pimple on: 12/08/2012 10:07 AM   Modules accepted: Orders

## 2012-12-14 ENCOUNTER — Ambulatory Visit: Payer: Medicare Other | Admitting: Internal Medicine

## 2012-12-15 ENCOUNTER — Ambulatory Visit (INDEPENDENT_AMBULATORY_CARE_PROVIDER_SITE_OTHER): Payer: Medicare Other | Admitting: Internal Medicine

## 2012-12-15 ENCOUNTER — Encounter: Payer: Self-pay | Admitting: Internal Medicine

## 2012-12-15 VITALS — BP 122/66 | HR 46 | Temp 98.1°F

## 2012-12-15 DIAGNOSIS — T8450XD Infection and inflammatory reaction due to unspecified internal joint prosthesis, subsequent encounter: Secondary | ICD-10-CM

## 2012-12-15 DIAGNOSIS — Z5189 Encounter for other specified aftercare: Secondary | ICD-10-CM

## 2012-12-15 LAB — SEDIMENTATION RATE: Sed Rate: 58 mm/hr — ABNORMAL HIGH (ref 0–16)

## 2012-12-15 LAB — C-REACTIVE PROTEIN: CRP: 3.6 mg/dL — ABNORMAL HIGH (ref <0.60)

## 2012-12-15 NOTE — Progress Notes (Signed)
[facility] NOTE  RFV: PJI with PsA Subjective:    Patient ID: Derek Frye, male    DOB: 04-09-1940, 72 y.o.   MRN: [id number]  HPI  72 yo man with CAD, afib, numerous orthopedic surgeries for oa with joint replacements, hx of prior infection of left TKA in 2005 S/p I and D with resection arthroplasty and placement of antibiotic spacer on 09/28/09. 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   At end of April 2014, He recently developed right sided prosthetic knee infection underwent I X D by Dr. Charlann Boxer on 07/14/12 with polyexchange. Cultures isolated Pseudomonas Aeruginosa ( cipro R, cefepime 8 s, ceftaz 4 S, gent 8 I, imi 1 S, tobra <1 S), was initially on IV cefepime which he did not tolerate taking due to tremors despite being treated for anxiety with ativan 0.5mg  Q 6hr PRN and beta blockers he was  changed to meropenem on 7/17 tolerating it well for the past 8 wks ,and stopped in mid September.  He states that his knee has been feeling well. No difficulty with warmth, redness, or pain. No chills. Or fevers. His inflammatory markers were still elevated at: Lab Results  Component Value Date   ESRSEDRATE 58* 12/15/2012   Lab Results  Component Value Date   CRP 3.6* 12/15/2012   Thus he was switched to doxycycline 100mg  BId for chronic suppression. Dr. Charlann Boxer attempted to aspirate joint but dry tap. Unable to send fluid for analysis  Current Outpatient Prescriptions on File Prior to Visit  Medication Sig Dispense Refill  . acetaminophen (TYLENOL) 325 MG tablet Take 2 tablets (650 mg total) by mouth every 6 (six) hours as needed.      . docusate sodium (COLACE) 100 MG capsule Take 100-200 mg by mouth daily as needed. For constipation      . doxycycline (VIBRA-TABS) 100 MG tablet Take 1 tablet (100 mg total) by mouth 2 (two) times daily.  180 tablet  0  . furosemide (LASIX) 40 MG tablet Take 40 mg by mouth every  evening. Take one (1) tablet by mouth daily.      Marland Kitchen gabapentin (NEURONTIN) 100 MG capsule Take 100 mg by mouth 3 (three) times daily as needed. For phantom pain      . LORazepam (ATIVAN) 0.5 MG tablet Take 0.5 mg by mouth every 8 (eight) hours.      . magnesium gluconate (MAGONATE) 500 MG tablet Take 500 mg by mouth daily.       . meropenem (MERREM) 1 G injection Inject 1 g into the vein every 8 (eight) hours. Via PICC Line      . metFORMIN (GLUCOPHAGE) 850 MG tablet Take 850 mg by mouth 2 (two) times daily with a meal.       . metolazone (ZAROXOLYN) 2.5 MG tablet daily.       . potassium chloride SA (K-DUR,KLOR-CON) 20 MEQ tablet Take 40 mEq by mouth every evening.       . saccharomyces boulardii (FLORASTOR) 250 MG capsule Take 250 mg by mouth 2 (two) times daily.      . simvastatin (ZOCOR) 40 MG tablet Take 1 tablet (40 mg total) by mouth at bedtime.  90 tablet  3  . spironolactone (ALDACTONE) 25 MG tablet Take 25 mg by mouth 2 (two) times daily. Take one (1) tablet by mouth two (2) times a day.      . warfarin (COUMADIN) 2.5 MG  tablet Take 1.25-2.5 mg by mouth every evening. Takes 1.25 mg on sun, tue, thu, fri and 2.5 mg all other days       No current facility-administered medications on file prior to visit.   Active Ambulatory Problems    Diagnosis Date Noted  . DYSLIPIDEMIA 11/08/2008  . Fluid overload 11/08/2008  . RBBB 11/09/2008  . AAA 11/08/2008  . PERIPHERAL VASCULAR DISEASE 12/19/2009  . CELLULITIS AND ABSCESS OF LEG EXCEPT FOOT 12/19/2009  . Pyogenic arthritis, lower leg 12/19/2009  . TOBACCO ABUSE, HX OF 11/08/2008  . STREPTOCOCCUS INFECTION CCE & UNS SITE GROUP B 02/28/2010  . Encounter for long-term (current) use of anticoagulants 06/11/2010  . HTN (hypertension)   . Obesity   . CAD (coronary artery disease)   . Hyperlipidemia   . Cigarette smoker   . Atrial fibrillation   . Volume overload   . Right bundle branch block   . Warfarin anticoagulation   . Carotid  artery disease   . 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 Gram-negative infection 11/19/2010  . Ejection fraction < 50%   . Preop cardiovascular exam   . Cellulitis and abscess of lower leg 07/07/2012  . Type II or unspecified type diabetes mellitus with peripheral circulatory disorders, uncontrolled(250.72) 07/17/2012  . Unspecified constipation 07/20/2012  . Bradycardia    Resolved Ambulatory Problems    Diagnosis Date Noted  . No Resolved Ambulatory Problems   Past Medical History  Diagnosis Date  . History of cholecystectomy   . Total knee replacement status   . Prostate cancer   . Abdominal aortic aneurysm   . Diabetes mellitus   . Myocardial infarction 1984  . Ulcer   . Peripheral vascular disease   . Heart murmur   . Arthritis   . Tremor       Review of Systems Review of Systems  Constitutional: Negative for fever, chills, diaphoresis, activity change, appetite change, fatigue and unexpected weight change.  HENT: Negative for congestion, sore throat, rhinorrhea, sneezing, trouble swallowing and sinus pressure.  Eyes: Negative for photophobia and visual disturbance.  Respiratory: Negative for cough, chest tightness, shortness of breath, wheezing and stridor.  Cardiovascular: Negative for chest pain, palpitations and leg swelling.  Gastrointestinal: Negative for nausea, vomiting, abdominal pain, diarrhea, constipation, blood in stool, abdominal distention and anal bleeding.  Genitourinary: Negative for dysuria, hematuria, flank pain and difficulty urinating.  Musculoskeletal: right knee pain with PT Skin: Negative for color change, pallor, rash and wound.  Neurological: Negative for dizziness, tremors, weakness and light-headedness.  Hematological: Negative for adenopathy. Does not bruise/bleed easily.  Psychiatric/Behavioral: Negative for behavioral problems, confusion, sleep disturbance, dysphoric mood, decreased concentration and agitation.        Objective:   Physical Exam BP 122/66  Pulse 46  Temp(Src) 98.1 F (36.7 C) (Oral) Physical Exam  Constitutional: He is oriented to person, place, and time. He appears well-developed and well-nourished. No distress.  HENT:  Mouth/Throat: Oropharynx is clear and moist. No oropharyngeal exudate.  Cardiovascular: Normal rate, regular rhythm and normal heart sounds. Exam reveals no gallop and no friction rub.  No murmur heard.  Pulmonary/Chest: Effort normal and breath sounds normal. No respiratory distress. He has no wheezes.  Lymphadenopathy:  He has no cervical adenopathy.  Ext: right TKA incision is c/d/i no swelling or erythema. Has left BKA Skin: Skin is warm and dry. No rash noted. No erythema.  Psychiatric: He has a normal mood and affect. His behavior is  normal.       Assessment & Plan:  Prosthetic joint infection = he was treated with 4 months of IV antibiotics for PsA infection. We transitioned to doxycycline. His PsA did not have oral options for suppression. We will watch for worsening symptoms.  rtc in 4 wks.

## 2012-12-25 ENCOUNTER — Ambulatory Visit (INDEPENDENT_AMBULATORY_CARE_PROVIDER_SITE_OTHER): Payer: Medicare Other | Admitting: *Deleted

## 2012-12-25 DIAGNOSIS — Z7901 Long term (current) use of anticoagulants: Secondary | ICD-10-CM

## 2012-12-25 LAB — POCT INR: INR: 4

## 2013-01-08 ENCOUNTER — Ambulatory Visit (INDEPENDENT_AMBULATORY_CARE_PROVIDER_SITE_OTHER): Payer: Medicare Other | Admitting: *Deleted

## 2013-01-08 DIAGNOSIS — Z7901 Long term (current) use of anticoagulants: Secondary | ICD-10-CM

## 2013-01-08 LAB — POCT INR: INR: 4.1

## 2013-01-12 ENCOUNTER — Encounter: Payer: Self-pay | Admitting: Internal Medicine

## 2013-01-12 ENCOUNTER — Ambulatory Visit (INDEPENDENT_AMBULATORY_CARE_PROVIDER_SITE_OTHER): Payer: Medicare Other | Admitting: Internal Medicine

## 2013-01-12 VITALS — BP 131/75 | HR 60 | Temp 97.8°F

## 2013-01-12 DIAGNOSIS — T889XXS Complication of surgical and medical care, unspecified, sequela: Secondary | ICD-10-CM

## 2013-01-12 DIAGNOSIS — T8450XS Infection and inflammatory reaction due to unspecified internal joint prosthesis, sequela: Secondary | ICD-10-CM

## 2013-01-12 LAB — SEDIMENTATION RATE: Sed Rate: 42 mm/hr — ABNORMAL HIGH (ref 0–16)

## 2013-01-12 NOTE — Progress Notes (Signed)
[facility] NOTE  RFV: routine follow up for PJI Subjective:    Patient ID: Derek Frye, male    DOB: 1941/01/16, 72 y.o.   MRN: [id number]  HPI 72 yo man with CAD, afib, numerous orthopedic surgeries for oa with joint replacements, hx of prior infection of left TKA in 2005 S/p I and D with resection arthroplasty and placement of antibiotic spacer on 09/28/09. 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  At end of April 2014, He recently developed right sided prosthetic knee infection underwent I X D by Dr. Charlann Boxer on 07/14/12 with polyexchange. Cultures isolated Pseudomonas Aeruginosa ( cipro R, cefepime 8 s, ceftaz 4 S, gent 8 I, imi 1 S, tobra <1 S), was initially on IV cefepime which he did not tolerate taking due to tremors despite being treated for anxiety with ativan 0.5mg  Q 6hr PRN and beta blockers he was changed to meropenem on 7/17 tolerating it well for the past 8 wks ,and stopped in mid September. He states that his knee has been feeling well. No difficulty with warmth, redness, or pain.His inflammatory markers were still elevated at: 58 and crp of 3.6 on 12/15/12  Thus he was switched to doxycycline 100mg  BId for chronic suppression. Doing well. No fever, chills, nightsweats  Current Outpatient Prescriptions on File Prior to Visit  Medication Sig Dispense Refill  . acetaminophen (TYLENOL) 325 MG tablet Take 2 tablets (650 mg total) by mouth every 6 (six) hours as needed.      . docusate sodium (COLACE) 100 MG capsule Take 100-200 mg by mouth daily as needed. For constipation      . doxycycline (VIBRA-TABS) 100 MG tablet Take 1 tablet (100 mg total) by mouth 2 (two) times daily.  180 tablet  0  . furosemide (LASIX) 40 MG tablet Take 40 mg by mouth daily. Take one (1) tablet by mouth daily.      Marland Kitchen gabapentin (NEURONTIN) 100 MG capsule Take 100 mg by mouth 3 (three) times daily as needed. For phantom pain       . LORazepam (ATIVAN) 0.5 MG tablet Take 0.5 mg by mouth every 8 (eight) hours.      . magnesium gluconate (MAGONATE) 500 MG tablet Take 500 mg by mouth daily.       . metFORMIN (GLUCOPHAGE) 850 MG tablet Take 850 mg by mouth 2 (two) times daily with a meal.       . metolazone (ZAROXOLYN) 2.5 MG tablet daily.       . potassium chloride SA (K-DUR,KLOR-CON) 20 MEQ tablet Take 40 mEq by mouth every evening.       . saccharomyces boulardii (FLORASTOR) 250 MG capsule Take 250 mg by mouth 2 (two) times daily.      . simvastatin (ZOCOR) 40 MG tablet Take 1 tablet (40 mg total) by mouth at bedtime.  90 tablet  3  . spironolactone (ALDACTONE) 25 MG tablet Take 25 mg by mouth 2 (two) times daily. Take one (1) tablet by mouth two (2) times a day.      . warfarin (COUMADIN) 2.5 MG tablet Take 1.25-2.5 mg by mouth every evening. Takes 1.25 mg on sun, tue, thu, fri and 2.5 mg all other days       No current facility-administered medications on file prior to visit.   Active Ambulatory Problems    Diagnosis Date Noted  . DYSLIPIDEMIA 11/08/2008  . Fluid overload 11/08/2008  .  RBBB 11/09/2008  . AAA 11/08/2008  . PERIPHERAL VASCULAR DISEASE 12/19/2009  . CELLULITIS AND ABSCESS OF LEG EXCEPT FOOT 12/19/2009  . Pyogenic arthritis, lower leg 12/19/2009  . TOBACCO ABUSE, HX OF 11/08/2008  . STREPTOCOCCUS INFECTION CCE & UNS SITE GROUP B 02/28/2010  . Encounter for long-term (current) use of anticoagulants 06/11/2010  . HTN (hypertension)   . Obesity   . CAD (coronary artery disease)   . Hyperlipidemia   . Cigarette smoker   . Atrial fibrillation   . Volume overload   . Right bundle branch block   . Warfarin anticoagulation   . Carotid artery disease   . 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 Gram-negative infection 11/19/2010  . Ejection fraction < 50%   . Preop cardiovascular exam   . Cellulitis and abscess of lower leg 07/07/2012  . Type II or unspecified type diabetes mellitus  with peripheral circulatory disorders, uncontrolled(250.72) 07/17/2012  . Unspecified constipation 07/20/2012  . Bradycardia    Resolved Ambulatory Problems    Diagnosis Date Noted  . No Resolved Ambulatory Problems   Past Medical History  Diagnosis Date  . History of cholecystectomy   . Total knee replacement status   . Prostate cancer   . Abdominal aortic aneurysm   . Diabetes mellitus   . Myocardial infarction 1984  . Ulcer   . Peripheral vascular disease   . Heart murmur   . Arthritis   . Tremor       Review of Systems 12 point ROS is negative    Objective:   Physical Exam BP 131/75  Pulse 60  Temp(Src) 97.8 F (36.6 C) (Oral) Physical Exam  Constitutional: He is oriented to person, place, and time. He appears well-developed and well-nourished. No distress.  HENT:  Mouth/Throat: Oropharynx is clear and moist. No oropharyngeal exudate.  Cardiovascular: Normal rate, regular rhythm and normal heart sounds. Exam reveals no gallop and no friction rub.  No murmur heard.  Pulmonary/Chest: Effort normal and breath sounds normal. No respiratory distress. He has no wheezes.  Abdominal: Soft. Bowel sounds are normal. He exhibits no distension. There is no tenderness.  Lymphadenopathy: no cervical adenopathy.  Ext: left bka, right prosthetic knee, no swelling no warmth Skin: Skin is warm and dry. No rash noted. No erythema.  Psychiatric: He has a normal mood and affect. His behavior is normal.      Assessment & Plan:  PsA prosthetic joint infection = had finished 4 months of IV meropenem since no oral agents available for chronic suppression. Due to his past hx of staph aureus, he is curently on doxycycline 100mg  BID which he is tolerating. We will check sed rate and crp. rtc in 6 -8 wks

## 2013-01-22 ENCOUNTER — Ambulatory Visit (INDEPENDENT_AMBULATORY_CARE_PROVIDER_SITE_OTHER): Payer: Medicare Other | Admitting: *Deleted

## 2013-01-22 DIAGNOSIS — Z7901 Long term (current) use of anticoagulants: Secondary | ICD-10-CM

## 2013-02-08 ENCOUNTER — Ambulatory Visit (INDEPENDENT_AMBULATORY_CARE_PROVIDER_SITE_OTHER): Payer: Medicare Other | Admitting: General Practice

## 2013-02-08 DIAGNOSIS — Z7901 Long term (current) use of anticoagulants: Secondary | ICD-10-CM

## 2013-02-16 ENCOUNTER — Ambulatory Visit (INDEPENDENT_AMBULATORY_CARE_PROVIDER_SITE_OTHER): Payer: Medicare Other | Admitting: Podiatry

## 2013-02-16 ENCOUNTER — Encounter: Payer: Self-pay | Admitting: Podiatry

## 2013-02-16 VITALS — BP 149/75 | HR 57 | Resp 16 | Ht 71.0 in | Wt 215.0 lb

## 2013-02-16 DIAGNOSIS — M79609 Pain in unspecified limb: Secondary | ICD-10-CM

## 2013-02-16 DIAGNOSIS — B351 Tinea unguium: Secondary | ICD-10-CM

## 2013-02-16 NOTE — Patient Instructions (Signed)
Diabetes and Foot Care Diabetes may cause you to have problems because of poor blood supply (circulation) to your feet and legs. This may cause the skin on your feet to become thinner, break easier, and heal more slowly. Your skin may become dry, and the skin may peel and crack. You may also have nerve damage in your legs and feet causing decreased feeling in them. You may not notice minor injuries to your feet that could lead to infections or more serious problems. Taking care of your feet is one of the most important things you can do for yourself.  HOME CARE INSTRUCTIONS  Wear shoes at all times, even in the house. Do not go barefoot. Bare feet are easily injured.  Check your feet daily for blisters, cuts, and redness. If you cannot see the bottom of your feet, use a mirror or ask someone for help.  Wash your feet with warm water (do not use hot water) and mild soap. Then pat your feet and the areas between your toes until they are completely dry. Do not soak your feet as this can dry your skin.  Apply a moisturizing lotion or petroleum jelly (that does not contain alcohol and is unscented) to the skin on your feet and to dry, brittle toenails. Do not apply lotion between your toes.  Trim your toenails straight across. Do not dig under them or around the cuticle. File the edges of your nails with an emery board or nail file.  Do not cut corns or calluses or try to remove them with medicine.  Wear clean socks or stockings every day. Make sure they are not too tight. Do not wear knee-high stockings since they may decrease blood flow to your legs.  Wear shoes that fit properly and have enough cushioning. To break in new shoes, wear them for just a few hours a day. This prevents you from injuring your feet. Always look in your shoes before you put them on to be sure there are no objects inside.  Do not cross your legs. This may decrease the blood flow to your feet.  If you find a minor scrape,  cut, or break in the skin on your feet, keep it and the skin around it clean and dry. These areas may be cleansed with mild soap and water. Do not cleanse the area with peroxide, alcohol, or iodine.  When you remove an adhesive bandage, be sure not to damage the skin around it.  If you have a wound, look at it several times a day to make sure it is healing.  Do not use heating pads or hot water bottles. They may burn your skin. If you have lost feeling in your feet or legs, you may not know it is happening until it is too late.  Make sure your health care provider performs a complete foot exam at least annually or more often if you have foot problems. Report any cuts, sores, or bruises to your health care provider immediately. SEEK MEDICAL CARE IF:   You have an injury that is not healing.  You have cuts or breaks in the skin.  You have an ingrown nail.  You notice redness on your legs or feet.  You feel burning or tingling in your legs or feet.  You have pain or cramps in your legs and feet.  Your legs or feet are numb.  Your feet always feel cold. SEEK IMMEDIATE MEDICAL CARE IF:   There is increasing redness,   swelling, or pain in or around a wound.  There is a red line that goes up your leg.  Pus is coming from a wound.  You develop a fever or as directed by your health care provider.  You notice a bad smell coming from an ulcer or wound. Document Released: 02/23/2000 Document Revised: 10/28/2012 Document Reviewed: 08/04/2012 [employer] Patient Information 2014 [employer].  

## 2013-02-16 NOTE — Progress Notes (Signed)
   Subjective:    Patient ID: Derek Frye, male    DOB: 1940-07-09, 72 y.o.   MRN: [id number]  HPI Comments: "I just need to get these nails trimmed"  Pt presents to office for toenail trim on right foot. Left BKA. States that the right great toenail is cracking down into the quick. Unable to trim them anymore.     Review of Systems  Musculoskeletal: Positive for gait problem.       BKA left  Skin:       Fissure skin Change in nails  All other systems reviewed and are negative.       Objective:   Physical Exam: I have reviewed his past medical history medications allergies social history family history and surgical history. Evaluation of the right lower extremity reveals palpable dorsalis pedis pulse right capillary fill time to digits one through 5 right foot is sluggish. Neurologic sensorium is intact. Deep tendon reflexes are in non-elicitable. Orthopedic evaluation demonstrates all joints distal to the ankle a full range of motion but are stiff. Muscle strength is 4/5 dorsiflexors plantar flexors inverters and evertors. Cutaneous evaluation demonstrates dry xerotic skin dorsally tight edematous skin plantarly and nails are thick yellow dystrophic and clinically mycotic.        Assessment & Plan:  Assessment: Moderate to severe peripheral vascular disease history of amputation left above-knee. Pain in limb secondary to onychomycosis 1 through 5 right foot.  Plan: Debridement of nails 1 through 5 right foot. Followup in 3 months.

## 2013-02-22 ENCOUNTER — Ambulatory Visit (INDEPENDENT_AMBULATORY_CARE_PROVIDER_SITE_OTHER): Payer: Medicare Other | Admitting: *Deleted

## 2013-02-22 DIAGNOSIS — Z7901 Long term (current) use of anticoagulants: Secondary | ICD-10-CM

## 2013-02-22 LAB — POCT INR: INR: 4.5

## 2013-02-22 MED ORDER — WARFARIN SODIUM 2.5 MG PO TABS: 2.5000 mg | ORAL_TABLET | ORAL | Status: DC

## 2013-02-23 ENCOUNTER — Ambulatory Visit (INDEPENDENT_AMBULATORY_CARE_PROVIDER_SITE_OTHER): Payer: Medicare Other | Admitting: Internal Medicine

## 2013-02-23 ENCOUNTER — Encounter: Payer: Self-pay | Admitting: Internal Medicine

## 2013-02-23 VITALS — BP 135/60 | HR 51 | Temp 98.2°F

## 2013-02-23 DIAGNOSIS — T889XXS Complication of surgical and medical care, unspecified, sequela: Secondary | ICD-10-CM

## 2013-02-23 DIAGNOSIS — T8450XS Infection and inflammatory reaction due to unspecified internal joint prosthesis, sequela: Secondary | ICD-10-CM

## 2013-02-23 NOTE — Progress Notes (Signed)
Rfv: chronic suppression for PJI Subjective:    Patient ID: Derek Frye, male    DOB: March 31, 1940, 72 y.o.   MRN: [id number]   HPI 72 yo man with CAD, afib, numerous orthopedic surgeries for oa with joint replacements, hx of prior infection of left TKA in 2005 S/p I and D with resection arthroplasty and placement of antibiotic spacer on 09/28/09. 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   At end of April 2014, He recently developed right knee prosthetic knee infection underwent I X D by Dr. Charlann Boxer on 07/14/12 with polyexchange. Cultures isolated Pseudomonas Aeruginosa ( cipro R, cefepime 8 s, ceftaz 4 S, gent 8 I, imi 1 S, tobra <1 S), finished 8 wks of meropenem. In mid-late October, He then had episode of increase swelling to the knee, where Dr. Charlann Boxer attempted aspirate, which was a dry tape, and then  placed him on doxycycline doxycycline 100mg  BId for chronic suppression. Doing well. No fever, chills, nightsweats. We saw him 6 wks ago and his inflammatory markers were improved. As seen here  Lab Results  Component Value Date   ESRSEDRATE 42* 01/12/2013   Lab Results  Component Value Date   CRP 2.3* 01/12/2013   He still reports consistent pain to lateral aspect of his right knee. Saw dr. Charlann Boxer last week, had plan films that were concerning for possible hairline fracture of patella.   No problems with doxy. No thrush. Current Outpatient Prescriptions on File Prior to Visit  Medication Sig Dispense Refill  . acetaminophen (TYLENOL) 325 MG tablet Take 2 tablets (650 mg total) by mouth every 6 (six) hours as needed.      . docusate sodium (COLACE) 100 MG capsule Take 100-200 mg by mouth daily as needed. For constipation      . doxycycline (VIBRA-TABS) 100 MG tablet Take 1 tablet (100 mg total) by mouth 2 (two) times daily.  180 tablet  0  . furosemide (LASIX) 40 MG tablet Take 40 mg by mouth daily. Take one  (1) tablet by mouth daily.      Marland Kitchen gabapentin (NEURONTIN) 100 MG capsule Take 100 mg by mouth 3 (three) times daily as needed. For phantom pain      . LORazepam (ATIVAN) 0.5 MG tablet Take 0.5 mg by mouth every 8 (eight) hours.      . magnesium gluconate (MAGONATE) 500 MG tablet Take 500 mg by mouth daily.       . metFORMIN (GLUCOPHAGE) 850 MG tablet Take 850 mg by mouth 2 (two) times daily with a meal.       . metolazone (ZAROXOLYN) 2.5 MG tablet daily.       . potassium chloride SA (K-DUR,KLOR-CON) 20 MEQ tablet Take 40 mEq by mouth every evening.       . saccharomyces boulardii (FLORASTOR) 250 MG capsule Take 250 mg by mouth 2 (two) times daily.      . simvastatin (ZOCOR) 40 MG tablet Take 1 tablet (40 mg total) by mouth at bedtime.  90 tablet  3  . spironolactone (ALDACTONE) 25 MG tablet Take 25 mg by mouth 2 (two) times daily. Take one (1) tablet by mouth two (2) times a day.      . traMADol (ULTRAM) 50 MG tablet       . warfarin (COUMADIN) 2.5 MG tablet Take 1 tablet (2.5 mg total) by mouth as directed.  90 tablet  0   No current  facility-administered medications on file prior to visit.   Active Ambulatory Problems    Diagnosis Date Noted  . DYSLIPIDEMIA 11/08/2008  . Fluid overload 11/08/2008  . RBBB 11/09/2008  . AAA 11/08/2008  . PERIPHERAL VASCULAR DISEASE 12/19/2009  . CELLULITIS AND ABSCESS OF LEG EXCEPT FOOT 12/19/2009  . Pyogenic arthritis, lower leg 12/19/2009  . TOBACCO ABUSE, HX OF 11/08/2008  . STREPTOCOCCUS INFECTION CCE & UNS SITE GROUP B 02/28/2010  . Encounter for long-term (current) use of anticoagulants 06/11/2010  . HTN (hypertension)   . Obesity   . CAD (coronary artery disease)   . Hyperlipidemia   . Cigarette smoker   . Atrial fibrillation   . Volume overload   . Right bundle branch block   . Warfarin anticoagulation   . Carotid artery disease   . 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 Gram-negative infection 11/19/2010  . Ejection  fraction < 50%   . Preop cardiovascular exam   . Cellulitis and abscess of lower leg 07/07/2012  . Type II or unspecified type diabetes mellitus with peripheral circulatory disorders, uncontrolled(250.72) 07/17/2012  . Unspecified constipation 07/20/2012  . Bradycardia    Resolved Ambulatory Problems    Diagnosis Date Noted  . No Resolved Ambulatory Problems   Past Medical History  Diagnosis Date  . History of cholecystectomy   . Total knee replacement status   . Prostate cancer   . Abdominal aortic aneurysm   . Diabetes mellitus   . Myocardial infarction 1984  . Ulcer   . Peripheral vascular disease   . Heart murmur   . Arthritis   . Tremor      Review of Systems 12 point ROS reviewed and non contributory.    Objective:   Physical Exam BP 135/60  Pulse 51  Temp(Src) 98.2 F (36.8 C) (Oral) Physical Exam  Constitutional: He is oriented to person, place, and time. He appears well-developed and well-nourished. No distress.  HENT:  Mouth/Throat: Oropharynx is clear and moist. No oropharyngeal exudate.  Cardiovascular: Normal rate, regular rhythm and normal heart sounds. Exam reveals no gallop and no friction rub.  No murmur heard.  Pulmonary/Chest: Effort normal and breath sounds normal. No respiratory distress. He has no wheezes.  Lymphadenopathy:  no cervical adenopathy.  Ext: left knee AKA, and right leg, slight warmth to knee. No effusion. +1-2 edema lower extremity Skin: Skin is warm and dry. No rash noted. No erythema.  Psychiatric: He has a normal mood and affect. His behavior is normal.   Lab Results  Component Value Date   ESRSEDRATE 60* 02/23/2013   Lab Results  Component Value Date   CRP 6.1* 02/23/2013        Assessment & Plan:  Chronic prosthetic joint infection- will check sed rate and crp today - continue on doxy for time to be determine. Continue until our next appt in 6-8 wks.  Elevated inflammatory markers = likely elevated due to recent  inflammation associated with hairline fracture. Will continue to monitor  Cc; olin

## 2013-02-24 LAB — SEDIMENTATION RATE: Sed Rate: 60 mm/hr — ABNORMAL HIGH (ref 0–16)

## 2013-03-08 ENCOUNTER — Ambulatory Visit (INDEPENDENT_AMBULATORY_CARE_PROVIDER_SITE_OTHER): Payer: Medicare Other | Admitting: Pharmacist

## 2013-03-08 DIAGNOSIS — Z5181 Encounter for therapeutic drug level monitoring: Secondary | ICD-10-CM

## 2013-03-08 DIAGNOSIS — Z7901 Long term (current) use of anticoagulants: Secondary | ICD-10-CM

## 2013-03-08 LAB — POCT INR: INR: 1.9

## 2013-03-26 ENCOUNTER — Ambulatory Visit (INDEPENDENT_AMBULATORY_CARE_PROVIDER_SITE_OTHER): Payer: Medicare Other | Admitting: *Deleted

## 2013-03-26 DIAGNOSIS — Z5181 Encounter for therapeutic drug level monitoring: Secondary | ICD-10-CM

## 2013-03-26 DIAGNOSIS — Z7901 Long term (current) use of anticoagulants: Secondary | ICD-10-CM

## 2013-03-26 DIAGNOSIS — I10 Essential (primary) hypertension: Secondary | ICD-10-CM

## 2013-03-26 LAB — POCT INR: INR: 1.7

## 2013-04-09 ENCOUNTER — Ambulatory Visit (INDEPENDENT_AMBULATORY_CARE_PROVIDER_SITE_OTHER): Payer: Medicare Other

## 2013-04-09 DIAGNOSIS — Z7901 Long term (current) use of anticoagulants: Secondary | ICD-10-CM

## 2013-04-09 DIAGNOSIS — I4891 Unspecified atrial fibrillation: Secondary | ICD-10-CM

## 2013-04-09 DIAGNOSIS — Z5181 Encounter for therapeutic drug level monitoring: Secondary | ICD-10-CM | POA: Insufficient documentation

## 2013-04-09 LAB — POCT INR: INR: 1.9

## 2013-04-26 ENCOUNTER — Ambulatory Visit (HOSPITAL_COMMUNITY): Payer: Medicare Other | Attending: Cardiovascular Disease

## 2013-04-26 ENCOUNTER — Encounter (HOSPITAL_COMMUNITY): Payer: Medicare Other

## 2013-04-26 ENCOUNTER — Ambulatory Visit (INDEPENDENT_AMBULATORY_CARE_PROVIDER_SITE_OTHER): Payer: Medicare Other | Admitting: Pharmacist

## 2013-04-26 DIAGNOSIS — I658 Occlusion and stenosis of other precerebral arteries: Secondary | ICD-10-CM | POA: Insufficient documentation

## 2013-04-26 DIAGNOSIS — Z951 Presence of aortocoronary bypass graft: Secondary | ICD-10-CM | POA: Insufficient documentation

## 2013-04-26 DIAGNOSIS — F17200 Nicotine dependence, unspecified, uncomplicated: Secondary | ICD-10-CM | POA: Insufficient documentation

## 2013-04-26 DIAGNOSIS — I4891 Unspecified atrial fibrillation: Secondary | ICD-10-CM

## 2013-04-26 DIAGNOSIS — E119 Type 2 diabetes mellitus without complications: Secondary | ICD-10-CM | POA: Insufficient documentation

## 2013-04-26 DIAGNOSIS — I739 Peripheral vascular disease, unspecified: Secondary | ICD-10-CM | POA: Insufficient documentation

## 2013-04-26 DIAGNOSIS — Z7901 Long term (current) use of anticoagulants: Secondary | ICD-10-CM

## 2013-04-26 DIAGNOSIS — I10 Essential (primary) hypertension: Secondary | ICD-10-CM | POA: Insufficient documentation

## 2013-04-26 DIAGNOSIS — I2510 Atherosclerotic heart disease of native coronary artery without angina pectoris: Secondary | ICD-10-CM | POA: Insufficient documentation

## 2013-04-26 DIAGNOSIS — E785 Hyperlipidemia, unspecified: Secondary | ICD-10-CM | POA: Insufficient documentation

## 2013-04-26 DIAGNOSIS — I6529 Occlusion and stenosis of unspecified carotid artery: Secondary | ICD-10-CM | POA: Insufficient documentation

## 2013-04-26 DIAGNOSIS — Z5181 Encounter for therapeutic drug level monitoring: Secondary | ICD-10-CM

## 2013-04-26 LAB — POCT INR: INR: 2.9

## 2013-04-27 ENCOUNTER — Ambulatory Visit: Payer: Medicare Other | Admitting: Internal Medicine

## 2013-04-29 ENCOUNTER — Encounter: Payer: Self-pay | Admitting: Cardiology

## 2013-05-12 ENCOUNTER — Encounter: Payer: Self-pay | Admitting: Internal Medicine

## 2013-05-12 ENCOUNTER — Ambulatory Visit (INDEPENDENT_AMBULATORY_CARE_PROVIDER_SITE_OTHER): Payer: Medicare Other | Admitting: Internal Medicine

## 2013-05-12 VITALS — BP 133/73 | HR 52 | Temp 97.4°F

## 2013-05-12 DIAGNOSIS — B951 Streptococcus, group B, as the cause of diseases classified elsewhere: Secondary | ICD-10-CM

## 2013-05-12 DIAGNOSIS — T8450XA Infection and inflammatory reaction due to unspecified internal joint prosthesis, initial encounter: Secondary | ICD-10-CM

## 2013-05-12 DIAGNOSIS — I6529 Occlusion and stenosis of unspecified carotid artery: Secondary | ICD-10-CM

## 2013-05-12 LAB — C-REACTIVE PROTEIN: CRP: 1.9 mg/dL — AB (ref <0.60)

## 2013-05-12 LAB — SEDIMENTATION RATE: Sed Rate: 119 mm/hr — ABNORMAL HIGH (ref 0–16)

## 2013-05-12 MED ORDER — DOXYCYCLINE HYCLATE 100 MG PO TABS: 100.0000 mg | ORAL_TABLET | Freq: Two times a day (BID) | ORAL | Status: DC

## 2013-05-12 NOTE — Progress Notes (Signed)
Subjective:    Patient ID: Derek Frye, male    DOB: 1940-04-18, 73 y.o.   MRN: [id number]  HPI 73 yo man with CAD, afib, numerous orthopedic surgeries for oa with joint replacements, hx of prior infection of left TKA in 2005 S/p I and D with resection arthroplasty and placement of antibiotic spacer on 09/28/09. 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 At end of April 2014, He developed right knee prosthetic knee infection underwent I X D by Dr. Alvan Dame on 07/14/12 with polyexchange. OR cultures isolated Pseudomonas Aeruginosa ( cipro R, cefepime 8 s, ceftaz 4 S, gent 8 I, imi 1 S, tobra <1 S), finished 8 wks of meropenem. In mid-late October, He then had episode of increase swelling to the knee, where Dr. Alvan Dame attempted aspirate, which was a dry tape, and then placed him on doxycycline doxycycline 100mg  BId for chronic suppression. On his last visit he was diagnosed with hairline fracture to patella which is still causing him discomfort with weight bearing on his right leg. He also notices that he is unable to fully extend his right leg and feels that it gives on him when he weightbears. The challenge for mr. Mazzie is that he wears a left leg prosthesis and does not have much stability without his right leg fully extended. He denies any pain, swelling, or warmth to his right leg. He feels that he is in good health otherwise. He is unclear what will happen next for his right knee since he would like to ambulate to the point he was at this past summer. His wife feels he is significantly deconditioned.  Current Outpatient Prescriptions on File Prior to Visit  Medication Sig Dispense Refill  . acetaminophen (TYLENOL) 325 MG tablet Take 2 tablets (650 mg total) by mouth every 6 (six) hours as needed.      . docusate sodium (COLACE) 100 MG capsule Take 100-200 mg by mouth daily as needed. For constipation      . furosemide  (LASIX) 40 MG tablet Take 40 mg by mouth daily. Take one (1) tablet by mouth daily.      Marland Kitchen gabapentin (NEURONTIN) 100 MG capsule Take 100 mg by mouth 3 (three) times daily as needed. For phantom pain      . LORazepam (ATIVAN) 0.5 MG tablet Take 0.5 mg by mouth every 8 (eight) hours.      . magnesium gluconate (MAGONATE) 500 MG tablet Take 500 mg by mouth daily.       . metFORMIN (GLUCOPHAGE) 850 MG tablet Take 850 mg by mouth 2 (two) times daily with a meal.       . metolazone (ZAROXOLYN) 2.5 MG tablet daily.       . potassium chloride SA (K-DUR,KLOR-CON) 20 MEQ tablet Take 40 mEq by mouth every evening.       . saccharomyces boulardii (FLORASTOR) 250 MG capsule Take 250 mg by mouth 2 (two) times daily.      . simvastatin (ZOCOR) 40 MG tablet Take 1 tablet (40 mg total) by mouth at bedtime.  90 tablet  3  . spironolactone (ALDACTONE) 25 MG tablet Take 25 mg by mouth 2 (two) times daily. Take one (1) tablet by mouth two (2) times a day.      . traMADol (ULTRAM) 50 MG tablet       . warfarin (COUMADIN) 2.5 MG tablet Take 1 tablet (2.5 mg total) by mouth as  directed.  90 tablet  0   No current facility-administered medications on file prior to visit.   Active Ambulatory Problems    Diagnosis Date Noted  . DYSLIPIDEMIA 11/08/2008  . Fluid overload 11/08/2008  . RBBB 11/09/2008  . AAA 11/08/2008  . PERIPHERAL VASCULAR DISEASE 12/19/2009  . CELLULITIS AND ABSCESS OF LEG EXCEPT FOOT 12/19/2009  . Pyogenic arthritis, lower leg 12/19/2009  . TOBACCO ABUSE, HX OF 11/08/2008  . STREPTOCOCCUS INFECTION CCE & UNS SITE GROUP B 02/28/2010  . Encounter for long-term (current) use of anticoagulants 06/11/2010  . HTN (hypertension)   . Obesity   . CAD (coronary artery disease)   . Hyperlipidemia   . Cigarette smoker   . Atrial fibrillation   . Volume overload   . Right bundle branch block   . Warfarin anticoagulation   . Carotid artery disease   . 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 Gram-negative infection 11/19/2010  . Ejection fraction < 50%   . Preop cardiovascular exam   . Cellulitis and abscess of lower leg 07/07/2012  . Type II or unspecified type diabetes mellitus with peripheral circulatory disorders, uncontrolled(250.72) 07/17/2012  . Unspecified constipation 07/20/2012  . Bradycardia   . Encounter for therapeutic drug monitoring 04/09/2013   Resolved Ambulatory Problems    Diagnosis Date Noted  . No Resolved Ambulatory Problems   Past Medical History  Diagnosis Date  . History of cholecystectomy   . Total knee replacement status   . Prostate cancer   . Abdominal aortic aneurysm   . Diabetes mellitus   . Myocardial infarction 1984  . Ulcer   . Peripheral vascular disease   . Heart murmur   . Arthritis   . Tremor      Review of Systems 10 point ros is negative except positive pertinents listed in hpi    Objective:   Physical Exam BP 133/73  Pulse 52  Temp(Src) 97.4 F (36.3 C) (Oral) Physical Exam  Constitutional: He is oriented to person, place, and time. He appears well-developed and well-nourished. No distress.  HENT:  Mouth/Throat: Oropharynx is clear and moist. No oropharyngeal exudate.  Cardiovascular: Normal rate, regular rhythm and normal heart sounds. Exam reveals no gallop and no friction rub.  No murmur heard.  Pulmonary/Chest: Effort normal and breath sounds normal. No respiratory distress. He has no wheezes.  Abdominal: Soft. Bowel sounds are normal. He exhibits no distension. There is no tenderness.  Lymphadenopathy:  no cervical adenopathy.  Ext: right knee mild warmth to knee but no effusion Skin: Skin is warm and dry. No rash noted. No erythema.  Psychiatric: He has a normal mood and affect. His behavior is normal.        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 Now on chronic doxycycline for presumed Gram Positive pathogen. Knee appears improved on exam since last visit  however he does not have good mobility with it. Will check sed rate and crp today. Will give refill for doxy for addn 3 months. Defer to Dr. Alvan Dame for need of further imaging vs. Trial of physical therapy to help patient's range of motion.  rtc in 3 months

## 2013-05-13 ENCOUNTER — Telehealth: Payer: Self-pay | Admitting: *Deleted

## 2013-05-13 NOTE — Telephone Encounter (Signed)
Patient called wanting to know the results from labs drawn 05/12/13, would like to know if there is any change in treatment. Please advise. Landis Gandy, RN

## 2013-05-17 ENCOUNTER — Ambulatory Visit (INDEPENDENT_AMBULATORY_CARE_PROVIDER_SITE_OTHER): Payer: Medicare Other

## 2013-05-17 DIAGNOSIS — I4891 Unspecified atrial fibrillation: Secondary | ICD-10-CM

## 2013-05-17 DIAGNOSIS — Z7901 Long term (current) use of anticoagulants: Secondary | ICD-10-CM

## 2013-05-17 DIAGNOSIS — Z5181 Encounter for therapeutic drug level monitoring: Secondary | ICD-10-CM

## 2013-05-17 LAB — POCT INR: INR: 3.3

## 2013-05-17 MED ORDER — WARFARIN SODIUM 2.5 MG PO TABS: ORAL_TABLET | ORAL | Status: DC

## 2013-05-17 NOTE — Telephone Encounter (Signed)
Patient's wife called, asked for an appointment to discuss treatment options, as "the lab results are higher than they were before starting treatment."  RN gave patient an appointment for 3/16 at 11:00 (pt's wife's request to avoid the rainy weather this week).

## 2013-05-18 ENCOUNTER — Other Ambulatory Visit: Payer: Self-pay | Admitting: *Deleted

## 2013-05-18 ENCOUNTER — Ambulatory Visit (INDEPENDENT_AMBULATORY_CARE_PROVIDER_SITE_OTHER): Payer: Medicare Other | Admitting: Podiatry

## 2013-05-18 VITALS — BP 123/63 | HR 60 | Resp 18

## 2013-05-18 DIAGNOSIS — B951 Streptococcus, group B, as the cause of diseases classified elsewhere: Secondary | ICD-10-CM

## 2013-05-18 DIAGNOSIS — E1159 Type 2 diabetes mellitus with other circulatory complications: Secondary | ICD-10-CM

## 2013-05-18 DIAGNOSIS — M79609 Pain in unspecified limb: Secondary | ICD-10-CM

## 2013-05-18 DIAGNOSIS — B351 Tinea unguium: Secondary | ICD-10-CM

## 2013-05-18 DIAGNOSIS — Z899 Acquired absence of limb, unspecified: Secondary | ICD-10-CM

## 2013-05-18 DIAGNOSIS — E1169 Type 2 diabetes mellitus with other specified complication: Secondary | ICD-10-CM

## 2013-05-18 DIAGNOSIS — M2040 Other hammer toe(s) (acquired), unspecified foot: Secondary | ICD-10-CM

## 2013-05-18 MED ORDER — DOXYCYCLINE HYCLATE 100 MG PO TABS: 100.0000 mg | ORAL_TABLET | Freq: Two times a day (BID) | ORAL | Status: DC

## 2013-05-18 NOTE — Progress Notes (Signed)
Trim toenails right foot .  Objective: Vital signs are stable he is alert and oriented x3. Pulses are palpable to the right foot. Moderate edema right foot. History and indication above-knee left leg. He has hammertoe deformities. His pulses are barely palpable resulting in diabetic peripheral neuropathy diabetic peripheral angiopathy as well. Nails are thick yellow dystrophic with mycotic and painful palpation.  Assessment: Pain in limb secondary to onychomycosis 1 through 5 of the right foot. Hammertoe deformity. 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  Plan: Get him into a single diabetic shoes right. Also debridement of nails 1 through 5 of the right foot. Followup with him once his diabetic shoes come in.

## 2013-05-24 ENCOUNTER — Encounter: Payer: Self-pay | Admitting: Internal Medicine

## 2013-05-24 ENCOUNTER — Ambulatory Visit (INDEPENDENT_AMBULATORY_CARE_PROVIDER_SITE_OTHER): Payer: Medicare Other | Admitting: Internal Medicine

## 2013-05-24 VITALS — BP 123/61 | HR 59 | Temp 97.8°F

## 2013-05-24 DIAGNOSIS — I6529 Occlusion and stenosis of unspecified carotid artery: Secondary | ICD-10-CM

## 2013-05-24 DIAGNOSIS — T8450XA Infection and inflammatory reaction due to unspecified internal joint prosthesis, initial encounter: Secondary | ICD-10-CM

## 2013-05-24 NOTE — Progress Notes (Signed)
Subjective:    Patient ID: Derek Frye, male    DOB: [birth year], 73 y.o.   MRN: [id number]  HPI 73 yo man with CAD, afib, numerous orthopedic surgeries for oa with joint replacements, hx of prior infection of left TKA in 2005 S/p I and D with resection arthroplasty and placement of antibiotic spacer on 09/28/09.last treated for PsA PJI in Summer 2014 s/p polyexchange in May 2014. He had swelling of right knee and placed on chronic suppression with doxyccyline 100mg  BID in Oct 2014 for which he still remains on. In winter 2014, he was diagnosed with patellar hairline fracture that possibly causing his right knee pain. He was seen last week, where we drew his inflammatory markers, Sed rate at 119 (up from 60 the month prior) and CRP 1.9 (down from 4 the month prior). In setting of physical exam being fairly benign. Mr. And Derek Frye are back in clinic to see if there is anything that can be done to determine if he has recurrent PJI of right knee. He denies swelling or warm to his right knee. Still has intermittent pain when weightbearing. He saw dr. Alvan Frye last week as well who recommended conservative approach of watching closely before any further studies.   Ortho hx: 2011: Intraoperative cultures grew Diphtheroids,CoNS and Stenotrophomonas( R TMP/SMX, FQ & S to mino) underwent left AKA on10/2012. At end of April 2014, He developed right knee prosthetic knee infection underwent I X D by Dr. Alvan Frye on 07/14/12 with polyexchange. OR cultures isolated Pseudomonas Aeruginosa ( cipro R, cefepime 8 s, ceftaz 4 S, gent 8 I, imi 1 S, tobra <1 S), finished 8 wks of meropenem. In mid-late October, He then had episode of increase swelling to the knee, where Dr. Alvan Frye attempted aspirate, which was a dry tape, and then placed him on doxycycline doxycycline 100mg  BId for chronic suppression  Current Outpatient Prescriptions on File Prior to Visit  Medication Sig Dispense Refill  . acetaminophen (TYLENOL) 325 MG  tablet Take 2 tablets (650 mg total) by mouth every 6 (six) hours as needed.      . docusate sodium (COLACE) 100 MG capsule Take 100-200 mg by mouth daily as needed. For constipation      . doxycycline (VIBRA-TABS) 100 MG tablet Take 1 tablet (100 mg total) by mouth 2 (two) times daily.  180 tablet  0  . furosemide (LASIX) 40 MG tablet Take 40 mg by mouth daily. Take one (1) tablet by mouth daily.      Marland Kitchen gabapentin (NEURONTIN) 100 MG capsule Take 100 mg by mouth 3 (three) times daily as needed. For phantom pain      . LORazepam (ATIVAN) 0.5 MG tablet Take 0.5 mg by mouth every 8 (eight) hours.      . magnesium gluconate (MAGONATE) 500 MG tablet Take 500 mg by mouth daily.       . metFORMIN (GLUCOPHAGE) 850 MG tablet Take 850 mg by mouth 2 (two) times daily with a meal.       . metolazone (ZAROXOLYN) 2.5 MG tablet daily.       . potassium chloride SA (K-DUR,KLOR-CON) 20 MEQ tablet Take 40 mEq by mouth every evening.       . saccharomyces boulardii (FLORASTOR) 250 MG capsule Take 250 mg by mouth 2 (two) times daily.      . simvastatin (ZOCOR) 40 MG tablet Take 1 tablet (40 mg total) by mouth at bedtime.  90 tablet  3  . spironolactone (  ALDACTONE) 25 MG tablet Take 25 mg by mouth 2 (two) times daily. Take one (1) tablet by mouth two (2) times a day.      . traMADol (ULTRAM) 50 MG tablet       . warfarin (COUMADIN) 2.5 MG tablet Take as directed by anticoagulation clinic  90 tablet  1   No current facility-administered medications on file prior to visit.   Active Ambulatory Problems    Diagnosis Date Noted  . DYSLIPIDEMIA 11/08/2008  . Fluid overload 11/08/2008  . RBBB 11/09/2008  . AAA 11/08/2008  . PERIPHERAL VASCULAR DISEASE 12/19/2009  . CELLULITIS AND ABSCESS OF LEG EXCEPT FOOT 12/19/2009  . Pyogenic arthritis, lower leg 12/19/2009  . TOBACCO ABUSE, HX OF 11/08/2008  . STREPTOCOCCUS INFECTION CCE & UNS SITE GROUP B 02/28/2010  . Encounter for long-term (current) use of anticoagulants  06/11/2010  . HTN (hypertension)   . Obesity   . CAD (coronary artery disease)   . Hyperlipidemia   . Cigarette smoker   . Atrial fibrillation   . Volume overload   . Right bundle branch block   . Warfarin anticoagulation   . Carotid artery disease   . 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 Gram-negative infection 11/19/2010  . Ejection fraction < 50%   . Preop cardiovascular exam   . Cellulitis and abscess of lower leg 07/07/2012  . Type II or unspecified type diabetes mellitus with peripheral circulatory disorders, uncontrolled(250.72) 07/17/2012  . Unspecified constipation 07/20/2012  . Bradycardia   . Encounter for therapeutic drug monitoring 04/09/2013   Resolved Ambulatory Problems    Diagnosis Date Noted  . No Resolved Ambulatory Problems   Past Medical History  Diagnosis Date  . History of cholecystectomy   . Total knee replacement status   . Prostate cancer   . Abdominal aortic aneurysm   . Diabetes mellitus   . Myocardial infarction 1984  . Ulcer   . Peripheral vascular disease   . Heart murmur   . Arthritis   . Tremor      Review of Systems 10 point ros is negative except what is mentioned in hpi    Objective:   Physical Exam BP 123/61  Pulse 59  Temp(Src) 97.8 F (36.6 C) (Oral) gen = a xo by 4 fatigue, elderly appearing male in wheelchair Ext = right knee mildly warm to touch but no effusion, no tenderness, no erythema, similar to last evaluation last week. Left leg BKA  Lab Results  Component Value Date   ESRSEDRATE 119* 05/12/2013   Lab Results  Component Value Date   CRP 1.9* 05/12/2013       Assessment & Plan:  Chronic prosthetic joint infection = having discordance between abn labs and benign physical exam. Will continue to monitor. Repeat his sed rate and crp in early April to see if any significant change without change in meds. Continue on doxycycline. If swelling or erythema arises, will advocate to do arthrocentesis to see  if s/o infection.  rtc in mid april

## 2013-05-31 ENCOUNTER — Ambulatory Visit (INDEPENDENT_AMBULATORY_CARE_PROVIDER_SITE_OTHER): Payer: Medicare Other | Admitting: Cardiology

## 2013-05-31 ENCOUNTER — Encounter: Payer: Self-pay | Admitting: Cardiology

## 2013-05-31 ENCOUNTER — Ambulatory Visit (INDEPENDENT_AMBULATORY_CARE_PROVIDER_SITE_OTHER): Payer: Medicare Other | Admitting: *Deleted

## 2013-05-31 VITALS — BP 128/60 | HR 53 | Ht 71.0 in | Wt 220.0 lb

## 2013-05-31 DIAGNOSIS — I714 Abdominal aortic aneurysm, without rupture, unspecified: Secondary | ICD-10-CM

## 2013-05-31 DIAGNOSIS — I2510 Atherosclerotic heart disease of native coronary artery without angina pectoris: Secondary | ICD-10-CM

## 2013-05-31 DIAGNOSIS — I4891 Unspecified atrial fibrillation: Secondary | ICD-10-CM

## 2013-05-31 DIAGNOSIS — I6529 Occlusion and stenosis of unspecified carotid artery: Secondary | ICD-10-CM

## 2013-05-31 DIAGNOSIS — Z5181 Encounter for therapeutic drug level monitoring: Secondary | ICD-10-CM

## 2013-05-31 DIAGNOSIS — Z7901 Long term (current) use of anticoagulants: Secondary | ICD-10-CM

## 2013-05-31 LAB — POCT INR: INR: 2.9

## 2013-05-31 NOTE — Assessment & Plan Note (Signed)
Patient is followed carefully by Dr. Trula Slade after his endovascular repair.

## 2013-05-31 NOTE — Progress Notes (Signed)
Patient ID: Derek Frye, male   DOB: October 06, 1940, 73 y.o.   MRN: [id number]    HPI  Patient is seen today to followup coronary disease and left ventricular dysfunction. He actually looks quite stable today. He has an amputation of the left leg. He has knee prosthesis in his right leg. It is not red or swollen. It is followed very carefully by both orthopedics and infectious disease. He is on doxycycline as a suppressant. He's not having any chest pain or shortness of breath.  Allergies  Allergen Reactions  . Cefepime     ? tremors  . Amoxicillin     REACTION: swelling and a rash  . Morphine Nausea And Vomiting    Current Outpatient Prescriptions  Medication Sig Dispense Refill  . doxycycline (VIBRA-TABS) 100 MG tablet Take 1 tablet (100 mg total) by mouth 2 (two) times daily.  180 tablet  0  . furosemide (LASIX) 40 MG tablet Take 40 mg by mouth daily. Take one (1) tablet by mouth daily.      Marland Kitchen gabapentin (NEURONTIN) 100 MG capsule Take 100 mg by mouth 3 (three) times daily as needed. For phantom pain      . magnesium gluconate (MAGONATE) 500 MG tablet Take 500 mg by mouth daily.       . metFORMIN (GLUCOPHAGE) 850 MG tablet Take 850 mg by mouth 2 (two) times daily with a meal.       . metolazone (ZAROXOLYN) 2.5 MG tablet daily.       . potassium chloride SA (K-DUR,KLOR-CON) 20 MEQ tablet Take 40 mEq by mouth every evening.       . simvastatin (ZOCOR) 40 MG tablet Take 1 tablet (40 mg total) by mouth at bedtime.  90 tablet  3  . spironolactone (ALDACTONE) 25 MG tablet Take 25 mg by mouth 2 (two) times daily. Take one (1) tablet by mouth two (2) times a day.      . warfarin (COUMADIN) 2.5 MG tablet Take as directed by anticoagulation clinic  90 tablet  1   No current facility-administered medications for this visit.    History   Social History  . Marital Status: Married    Spouse Name: N/A    Number of Children: N/A  . Years of Education: N/A   Occupational History  . Not on  file.   Social History Main Topics  . Smoking status: Current Every Day Smoker -- 0.50 packs/day for 30 years    Types: Cigarettes, Cigars    Start date: 07/07/2012  . Smokeless tobacco: Never Used     Comment: using E cig too  . Alcohol Use: No  . Drug Use: No  . Sexual Activity: Not on file   Other Topics Concern  . Not on file   Social History Narrative  . No narrative on file    Family History  Problem Relation Age of Onset  . Stroke Father 74  . Diabetes Other     Past Medical History  Diagnosis Date  . HTN (hypertension)   . Obesity   . History of cholecystectomy   . CAD (coronary artery disease)     Status post CABG followed by redo in 1994 / nuclear 2007, old infarct, no ischemia  . Ejection fraction < 50%     EF  45%...echo 2004 /  EF 45%, echo, August, 2012  . Hyperlipidemia   . Cigarette smoker     He is now smoking cigars and needs to  quit.  . 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 no replacement until leg is stable  . Prostate cancer     History of prostate cancer with a seed implant and this is stable.  . Abdominal aortic aneurysm     is stable, being followed  . Atrial fibrillation     Rate control / going in and out of atrial fib, Coumadin  . Volume overload   . Right bundle branch block     noted November 09, 2008.  . Warfarin anticoagulation     Atrial fibrillation  . Carotid artery disease     Doppler September 2 818, 56-31% LICA / Doppler October, 2010, 0-39% R. ICA 49-70% LICA  . Diabetes mellitus   . Myocardial infarction 1984  . Ulcer   . Peripheral vascular disease   . 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 Heart murmur   . Arthritis   . Tremor     ? caused via by antibiotic currently taking  . Bradycardia     Past Surgical History  Procedure Laterality Date  . Total knee arthroplasty    . Coronary artery bypass graft  1994    x2  . Anal fissure repair    .  Pr vein bypass graft,aorto-fem-pop    . Leg amputation above knee  Oct. 23, 2012    Left Leg AKA  . Joint replacement      bil knee  . Abdominal aortic endovascular stent graft N/A 04/30/2012    Procedure:  Ultrasound guided,   inserstion of  ABDOMINAL AORTIC ENDOVASCULAR STENT GRAFT;  Surgeon: [name], MD;  Location: [facility];  Service: Vascular;  Laterality: N/A;  EVAR- [place]  . I&d knee with poly exchange Right 07/13/2012    Procedure: IRRIGATION AND DEBRIDEMENT KNEE WITH POLY EXCHANGE;  Surgeon: [name], MD;  Location: [facility];  Service: Orthopedics;  Laterality: Right;    Patient Active Problem List   Diagnosis Date Noted  . Ejection fraction < 50%     Priority: High  . Encounter for therapeutic drug monitoring 04/09/2013  . Bradycardia   . Unspecified constipation 07/20/2012  . Type II or unspecified type diabetes mellitus with peripheral circulatory disorders, uncontrolled(250.72) 07/17/2012  . Cellulitis and abscess of lower leg 07/07/2012  . Preop cardiovascular exam   . 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 Gram-negative infection 11/19/2010  . HTN (hypertension)   . Obesity   . CAD (coronary artery disease)   . Hyperlipidemia   . Cigarette smoker   . Atrial fibrillation   . Volume overload   . Right bundle branch block   . Warfarin anticoagulation   . Carotid artery disease   . Encounter for long-term (current) use of anticoagulants 06/11/2010  . STREPTOCOCCUS INFECTION CCE & UNS SITE GROUP B 02/28/2010  . PERIPHERAL VASCULAR DISEASE 12/19/2009  . CELLULITIS AND ABSCESS OF LEG EXCEPT FOOT 12/19/2009  . Pyogenic arthritis, lower leg 12/19/2009  . RBBB 11/09/2008  . DYSLIPIDEMIA 11/08/2008  . Fluid overload 11/08/2008  . AAA 11/08/2008  . TOBACCO ABUSE, HX OF 11/08/2008    ROS   Patient denies fever, chills, headache, sweats, rash, change in vision, change in hearing, chest pain, cough, nausea vomiting, urinary symptoms. All other systems are  reviewed and are negative.  PHYSICAL EXAM  Patient is oriented to person time and place. Affect is normal. There  is no jugulovenous distention. Lungs are clear. Respiratory effort is nonlabored. Cardiac exam reveals S1 and S2. The abdomen is soft. He has left leg amputation. His right leg is stable today.  Filed Vitals:   05/31/13 1510  BP: 128/60  Pulse: 53  Height: 5\' 11"  (1.803 m)  Weight: 220 lb (99.791 kg)  SpO2: 98%     ASSESSMENT & PLAN

## 2013-05-31 NOTE — Assessment & Plan Note (Signed)
Coronary disease is stable. He had an old scar in his nuclear scan February, 2014.

## 2013-05-31 NOTE — Patient Instructions (Signed)
Your physician recommends that you continue on your current medications as directed. Please refer to the Current Medication list given to you today.  Your physician wants you to follow-up in: 6 months. You will receive a reminder letter in the mail two months in advance. If you don't receive a letter, please call our office to schedule the follow-up appointment.  

## 2013-05-31 NOTE — Assessment & Plan Note (Signed)
Atrial fib rate is controlled. No change in therapy. 

## 2013-06-14 ENCOUNTER — Other Ambulatory Visit: Payer: Medicare Other

## 2013-06-14 DIAGNOSIS — T8450XA Infection and inflammatory reaction due to unspecified internal joint prosthesis, initial encounter: Secondary | ICD-10-CM

## 2013-06-15 ENCOUNTER — Encounter: Payer: Self-pay | Admitting: Internal Medicine

## 2013-06-15 ENCOUNTER — Ambulatory Visit (INDEPENDENT_AMBULATORY_CARE_PROVIDER_SITE_OTHER): Payer: Medicare Other | Admitting: Internal Medicine

## 2013-06-15 VITALS — BP 129/66 | HR 58 | Temp 98.7°F

## 2013-06-15 DIAGNOSIS — T8450XA Infection and inflammatory reaction due to unspecified internal joint prosthesis, initial encounter: Secondary | ICD-10-CM

## 2013-06-15 DIAGNOSIS — I6529 Occlusion and stenosis of unspecified carotid artery: Secondary | ICD-10-CM

## 2013-06-15 LAB — SEDIMENTATION RATE: Sed Rate: 81 mm/hr — ABNORMAL HIGH (ref 0–16)

## 2013-06-15 LAB — C-REACTIVE PROTEIN: CRP: 3 mg/dL — ABNORMAL HIGH (ref <0.60)

## 2013-06-15 NOTE — Progress Notes (Signed)
Subjective:    Patient ID: Derek Frye, male    DOB: January 29, 1941, 73 y.o.   MRN: [id number]  HPI HPI 73 yo man with CAD, afib, numerous orthopedic surgeries for oa with joint replacements, hx of prior infection of left TKA in 2005 S/p I and D with resection arthroplasty and placement of antibiotic spacer on 09/28/09.last treated for PsA PJI in Summer 2014 s/p polyexchange in May 2014. He had swelling of right knee and placed on chronic suppression with doxyccyline 100mg  BID in Oct 2014 for which he still remains on. In winter 2014, he was diagnosed with patellar hairline fracture that possibly causing his right knee pain.At his last visit, we drew his inflammatory markers, Sed rate at 119 (up from 60 the month prior) and CRP 1.9 (down from 4 the month prior). In setting of physical exam being fairly benign. We repeated his inflammatory markers, sed rate now down to 80 without any intervention. He still has some discomfort to his lateral patellar region. Does not have full extention of leg. He denies fever, chills, nightsweats. No warmth or swelling to PJI of right knee. He does have some swelling to right lower leg  Ortho hx:  2011: Intraoperative cultures grew Diphtheroids,CoNS and Stenotrophomonas( R TMP/SMX, FQ & S to mino) underwent left AKA on10/2012. At end of April 2014, He developed right knee prosthetic knee infection underwent I X D by Dr. Alvan Dame on 07/14/12 with polyexchange. OR cultures isolated Pseudomonas Aeruginosa ( cipro R, cefepime 8 s, ceftaz 4 S, gent 8 I, imi 1 S, tobra <1 S), finished 8 wks of meropenem. In mid-late October, He then had episode of increase swelling to the knee, where Dr. Alvan Dame attempted aspirate, which was a dry tape, and then placed him on  doxycycline 100mg  BId for chronic suppression  Current Outpatient Prescriptions on File Prior to Visit  Medication Sig Dispense Refill  . doxycycline (VIBRA-TABS) 100 MG tablet Take 1 tablet (100 mg total) by mouth 2 (two)  times daily.  180 tablet  0  . furosemide (LASIX) 40 MG tablet Take 40 mg by mouth daily. Take one (1) tablet by mouth daily.      Marland Kitchen gabapentin (NEURONTIN) 100 MG capsule Take 100 mg by mouth 3 (three) times daily as needed. For phantom pain      . magnesium gluconate (MAGONATE) 500 MG tablet Take 500 mg by mouth daily.       . metFORMIN (GLUCOPHAGE) 850 MG tablet Take 850 mg by mouth 2 (two) times daily with a meal.       . metolazone (ZAROXOLYN) 2.5 MG tablet daily.       . potassium chloride SA (K-DUR,KLOR-CON) 20 MEQ tablet Take 40 mEq by mouth every evening.       . simvastatin (ZOCOR) 40 MG tablet Take 1 tablet (40 mg total) by mouth at bedtime.  90 tablet  3  . spironolactone (ALDACTONE) 25 MG tablet Take 25 mg by mouth 2 (two) times daily. Take one (1) tablet by mouth two (2) times a day.      . warfarin (COUMADIN) 2.5 MG tablet Take as directed by anticoagulation clinic  90 tablet  1   No current facility-administered medications on file prior to visit.   Active Ambulatory Problems    Diagnosis Date Noted  . DYSLIPIDEMIA 11/08/2008  . Fluid overload 11/08/2008  . RBBB 11/09/2008  . AAA 11/08/2008  . PERIPHERAL VASCULAR DISEASE 12/19/2009  . CELLULITIS AND ABSCESS OF LEG  EXCEPT FOOT 12/19/2009  . Pyogenic arthritis, lower leg 12/19/2009  . TOBACCO ABUSE, HX OF 11/08/2008  . STREPTOCOCCUS INFECTION CCE & UNS SITE GROUP B 02/28/2010  . Encounter for long-term (current) use of anticoagulants 06/11/2010  . HTN (hypertension)   . Obesity   . CAD (coronary artery disease)   . Hyperlipidemia   . Cigarette smoker   . Atrial fibrillation   . Volume overload   . Right bundle branch block   . Warfarin anticoagulation   . Carotid artery disease   . 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 Gram-negative infection 11/19/2010  . Ejection fraction < 50%   . Preop cardiovascular exam   . Cellulitis and abscess of lower leg 07/07/2012  . Type II or unspecified type diabetes  mellitus with peripheral circulatory disorders, uncontrolled(250.72) 07/17/2012  . Unspecified constipation 07/20/2012  . Bradycardia   . Encounter for therapeutic drug monitoring 04/09/2013   Resolved Ambulatory Problems    Diagnosis Date Noted  . No Resolved Ambulatory Problems   Past Medical History  Diagnosis Date  . History of cholecystectomy   . Total knee replacement status   . Prostate cancer   . Abdominal aortic aneurysm   . Diabetes mellitus   . Myocardial infarction 1984  . Ulcer   . Peripheral vascular disease   . Heart murmur   . Arthritis   . Tremor      Review of Systems 10 point ros is negative, still has occ right knee pain    Objective:   Physical Exam  BP 129/66  Pulse 58  Temp(Src) 98.7 F (37.1 C) (Oral) gen = a x o by 3 in nad Ext = right knee no swelling or warmth, +1/+2 pitting edema near ankles. 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 For now will continue on doxycycline. Will watch and conservatively manage. Physical exam at this time does not suggest need to do aspiration to assess for smoldering infection  Arthritic pain = can use tylenol sparingly.  Labs in 5 wks  rtc in july

## 2013-06-16 ENCOUNTER — Other Ambulatory Visit: Payer: Self-pay | Admitting: Surgery

## 2013-06-16 LAB — BUN: BUN: 27 mg/dL — AB (ref 6–23)

## 2013-06-16 LAB — CREATININE, SERUM: Creat: 0.97 mg/dL (ref 0.50–1.35)

## 2013-06-21 ENCOUNTER — Ambulatory Visit: Payer: Medicare Other | Admitting: Surgery

## 2013-06-21 ENCOUNTER — Other Ambulatory Visit: Payer: Medicare Other

## 2013-06-24 ENCOUNTER — Ambulatory Visit
Admission: RE | Admit: 2013-06-24 | Discharge: 2013-06-24 | Disposition: A | Discharge status: Home / Self Care | Payer: Medicare Other | Source: Ambulatory Visit | Attending: Surgery | Admitting: Surgery

## 2013-06-24 DIAGNOSIS — I714 Abdominal aortic aneurysm, without rupture, unspecified: Secondary | ICD-10-CM

## 2013-06-24 DIAGNOSIS — Z48812 Encounter for surgical aftercare following surgery on the circulatory system: Secondary | ICD-10-CM

## 2013-06-24 MED ORDER — IOHEXOL 350 MG/ML SOLN
100.0000 mL | Freq: Once | INTRAVENOUS | Status: AC | PRN
  Administered 2013-06-24: 100 mL via INTRAVENOUS

## 2013-06-28 ENCOUNTER — Ambulatory Visit (INDEPENDENT_AMBULATORY_CARE_PROVIDER_SITE_OTHER): Payer: Medicare Other | Admitting: *Deleted

## 2013-06-28 DIAGNOSIS — Z7901 Long term (current) use of anticoagulants: Secondary | ICD-10-CM

## 2013-06-28 DIAGNOSIS — Z5181 Encounter for therapeutic drug level monitoring: Secondary | ICD-10-CM

## 2013-06-28 DIAGNOSIS — I4891 Unspecified atrial fibrillation: Secondary | ICD-10-CM

## 2013-06-28 LAB — POCT INR: INR: 2.3

## 2013-07-02 ENCOUNTER — Encounter: Payer: Self-pay | Admitting: Surgery

## 2013-07-05 ENCOUNTER — Ambulatory Visit (INDEPENDENT_AMBULATORY_CARE_PROVIDER_SITE_OTHER): Payer: Medicare Other | Admitting: Surgery

## 2013-07-05 ENCOUNTER — Encounter: Payer: Self-pay | Admitting: Surgery

## 2013-07-05 VITALS — BP 139/58 | HR 56 | Resp 16 | Ht 71.0 in | Wt 216.0 lb

## 2013-07-05 DIAGNOSIS — I714 Abdominal aortic aneurysm, without rupture, unspecified: Secondary | ICD-10-CM

## 2013-07-05 DIAGNOSIS — Z48812 Encounter for surgical aftercare following surgery on the circulatory system: Secondary | ICD-10-CM

## 2013-07-05 NOTE — Progress Notes (Signed)
Patient name: Derek Frye MRN: [id number] DOB: [birth year] Sex: male     Chief Complaint  Patient presents with  . Re-evaluation    6 mo F/up  AAA  with CTA ,  Pt needs results from scan    HISTORY OF PRESENT ILLNESS: The patient is back today for followup. He is status post percutaneous endovascular repair of a 5.6 cm infrarenal abdominal aortic aneurysm on 04/30/2012. A Gore Excluder device was deployed. A type II endoleak was visualized on his initial CT scan. He is back for repeat evaluation.  He has had issues with infection in his right knee.  He is now unable to straighten it fully.  He complains of pain in his right knee.  He has no abdominal pain.   Past Medical History  Diagnosis Date  . HTN (hypertension)   . Obesity   . History of cholecystectomy   . CAD (coronary artery disease)     Status post CABG followed by redo in 1994 / nuclear 2007, old infarct, no ischemia  . Ejection fraction < 50%     EF  45%...echo 2004 /  EF 45%, echo, August, 2012  . Hyperlipidemia   . Cigarette smoker     He is now smoking cigars and needs to quit.  . 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 no replacement until leg is stable  . Prostate cancer     History of prostate cancer with a seed implant and this is stable.  . Abdominal aortic aneurysm     is stable, being followed  . Atrial fibrillation     Rate control / going in and out of atrial fib, Coumadin  . Volume overload   . Right bundle branch block     noted November 09, 2008.  . Warfarin anticoagulation     Atrial fibrillation  . Carotid artery disease     Doppler September 2 409, 81-19% LICA / Doppler October, 2010, 0-39% R. ICA 14-78% LICA  . Diabetes mellitus   . Myocardial infarction 1984  . Ulcer   . Peripheral vascular disease   . 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 Heart murmur   . Arthritis   . Tremor     ? caused via by antibiotic  currently taking  . Bradycardia     Past Surgical History  Procedure Laterality Date  . Total knee arthroplasty    . Coronary artery bypass graft  1994    x2  . Anal fissure repair    . Pr vein bypass graft,aorto-fem-pop    . Leg amputation above knee  Oct. 23, 2012    Left Leg AKA  . Joint replacement      bil knee  . Abdominal aortic endovascular stent graft N/A 04/30/2012    Procedure:  Ultrasound guided,   inserstion of  ABDOMINAL AORTIC ENDOVASCULAR STENT GRAFT;  Surgeon: [name], MD;  Location: [facility];  Service: Vascular;  Laterality: N/A;  EVAR- Gore  . I&d knee with poly exchange Right 07/13/2012    Procedure: IRRIGATION AND DEBRIDEMENT KNEE WITH POLY EXCHANGE;  Surgeon: [name], MD;  Location: [facility];  Service: Orthopedics;  Laterality: Right;    History   Social History  . Marital Status: Married    Spouse Name: N/A    Number of Children: N/A  . Years of Education: N/A   Occupational  History  . Not on file.   Social History Main Topics  . Smoking status: Current Every Day Smoker -- 0.50 packs/day for 30 years    Types: Cigarettes, Cigars    Start date: 07/07/2012  . Smokeless tobacco: Never Used     Comment: using E cig too  . Alcohol Use: No  . Drug Use: No  . Sexual Activity: Not on file   Other Topics Concern  . Not on file   Social History Narrative  . No narrative on file    Family History  Problem Relation Age of Onset  . Stroke Father 70  . Hypertension Father   . Diabetes Other   . Diabetes Mother   . Cancer Sister   . Diabetes Sister   . Cancer Brother   . Diabetes Brother   . Heart disease Brother     Allergies as of 07/05/2013 - Review Complete 07/05/2013  Allergen Reaction Noted  . Cefepime  09/23/2012  . Amoxicillin    . Morphine Nausea And Vomiting     Current Outpatient Prescriptions on File Prior to Visit  Medication Sig Dispense Refill  . doxycycline (VIBRA-TABS) 100 MG tablet Take 1 tablet (100 mg total) by  mouth 2 (two) times daily.  180 tablet  0  . furosemide (LASIX) 40 MG tablet Take 40 mg by mouth daily. Take one (1) tablet by mouth daily.      Marland Kitchen gabapentin (NEURONTIN) 100 MG capsule Take 100 mg by mouth 3 (three) times daily as needed. For phantom pain      . magnesium gluconate (MAGONATE) 500 MG tablet Take 500 mg by mouth daily.       . metFORMIN (GLUCOPHAGE) 850 MG tablet Take 850 mg by mouth 2 (two) times daily with a meal.       . metolazone (ZAROXOLYN) 2.5 MG tablet daily.       . potassium chloride SA (K-DUR,KLOR-CON) 20 MEQ tablet Take 40 mEq by mouth every evening.       . simvastatin (ZOCOR) 40 MG tablet Take 1 tablet (40 mg total) by mouth at bedtime.  90 tablet  3  . spironolactone (ALDACTONE) 25 MG tablet Take 25 mg by mouth 2 (two) times daily. Take one (1) tablet by mouth two (2) times a day.      . warfarin (COUMADIN) 2.5 MG tablet Take as directed by anticoagulation clinic  90 tablet  1   No current facility-administered medications on file prior to visit.     REVIEW OF SYSTEMS: Please see history of present illness, otherwise negative   PHYSICAL EXAMINATION:   Vital signs are BP 139/58  Pulse 56  Resp 16  Ht 5\' 11"  (1.803 m)  Wt 216 lb (97.977 kg)  BMI 30.14 kg/m2  SpO2 98% General: The patient appears their stated age. HEENT:  No gross abnormalities Pulmonary:  Non labored breathing Abdomen: Soft and non-tender Musculoskeletal: Left AKA Neurologic: No focal weakness or paresthesias are detected, Skin: There are no ulcer or rashes noted. Psychiatric: The patient has normal affect.    Diagnostic Studies I have reviewed the CT scan which is a slight decrease in his aneurysm down to 5.5 x 5.3 cm.  There is a subtle type II endoleak.  Assessment: Status post endovascular aneurysm repair Plan: I discussed the CT scan findings with the patient.  It does appear that the endoleak is slightly less prominent on CT scan today.  I will have him followup in 6  months with an abdominal ultrasound.  Eldridge Abrahams, M.D. [facility] Office: [phone] Pager:  [phone]

## 2013-07-06 NOTE — Addendum Note (Signed)
Addended by: Mena Goes on: 07/06/2013 01:32 PM   Modules accepted: Orders

## 2013-07-20 ENCOUNTER — Other Ambulatory Visit: Payer: Medicare Other

## 2013-07-20 DIAGNOSIS — M009 Pyogenic arthritis, unspecified: Secondary | ICD-10-CM

## 2013-07-20 LAB — C-REACTIVE PROTEIN: CRP: 1.6 mg/dL — AB (ref <0.60)

## 2013-07-20 NOTE — Addendum Note (Signed)
Addended byMarlis Edelson on: 07/20/2013 04:10 PM   Modules accepted: Orders

## 2013-07-21 LAB — SEDIMENTATION RATE: SED RATE: 119 mm/h — AB (ref 0–16)

## 2013-07-22 ENCOUNTER — Telehealth: Payer: Self-pay

## 2013-07-22 NOTE — Telephone Encounter (Signed)
Patient calling for lab results.  Results signed by physician reviewed.  Patient asked what he should do at this point . I will send note to Dr Baxter Flattery to see if she has recommendations.  Patient is aware she is out of the office today.

## 2013-07-26 ENCOUNTER — Ambulatory Visit (INDEPENDENT_AMBULATORY_CARE_PROVIDER_SITE_OTHER): Payer: Medicare Other | Admitting: *Deleted

## 2013-07-26 DIAGNOSIS — I4891 Unspecified atrial fibrillation: Secondary | ICD-10-CM

## 2013-07-26 DIAGNOSIS — Z7901 Long term (current) use of anticoagulants: Secondary | ICD-10-CM

## 2013-07-26 DIAGNOSIS — Z5181 Encounter for therapeutic drug level monitoring: Secondary | ICD-10-CM

## 2013-07-26 LAB — POCT INR: INR: 3

## 2013-07-27 NOTE — Telephone Encounter (Signed)
Calling again to discuss lab results.  Pt's phone # [phone].

## 2013-07-27 NOTE — Telephone Encounter (Signed)
i Will call him this am

## 2013-08-05 ENCOUNTER — Other Ambulatory Visit: Payer: Self-pay | Admitting: Licensed Clinical Social Worker

## 2013-08-05 ENCOUNTER — Other Ambulatory Visit: Payer: Self-pay | Admitting: Internal Medicine

## 2013-08-05 MED ORDER — DOXYCYCLINE HYCLATE 100 MG PO TABS: ORAL_TABLET | ORAL | Status: DC

## 2013-08-10 ENCOUNTER — Ambulatory Visit (INDEPENDENT_AMBULATORY_CARE_PROVIDER_SITE_OTHER): Payer: Medicare Other | Admitting: Internal Medicine

## 2013-08-10 ENCOUNTER — Encounter: Payer: Self-pay | Admitting: Internal Medicine

## 2013-08-10 VITALS — BP 126/73 | HR 56 | Temp 97.6°F

## 2013-08-10 DIAGNOSIS — T8450XA Infection and inflammatory reaction due to unspecified internal joint prosthesis, initial encounter: Secondary | ICD-10-CM

## 2013-08-10 DIAGNOSIS — I6529 Occlusion and stenosis of unspecified carotid artery: Secondary | ICD-10-CM

## 2013-08-10 LAB — CBC WITH DIFFERENTIAL/PLATELET
Basophils Absolute: 0 10*3/uL (ref 0.0–0.1)
Basophils Relative: 0 % (ref 0–1)
EOS ABS: 0.2 10*3/uL (ref 0.0–0.7)
EOS PCT: 2 % (ref 0–5)
HEMATOCRIT: 33.1 % — AB (ref 39.0–52.0)
HEMOGLOBIN: 11 g/dL — AB (ref 13.0–17.0)
LYMPHS ABS: 1.6 10*3/uL (ref 0.7–4.0)
LYMPHS PCT: 21 % (ref 12–46)
MCH: 27 pg (ref 26.0–34.0)
MCHC: 33.2 g/dL (ref 30.0–36.0)
MCV: 81.1 fL (ref 78.0–100.0)
MONO ABS: 0.9 10*3/uL (ref 0.1–1.0)
MONOS PCT: 11 % (ref 3–12)
Neutro Abs: 5.1 10*3/uL (ref 1.7–7.7)
Neutrophils Relative %: 66 % (ref 43–77)
Platelets: 202 10*3/uL (ref 150–400)
RBC: 4.08 MIL/uL — AB (ref 4.22–5.81)
RDW: 17.7 % — ABNORMAL HIGH (ref 11.5–15.5)
WBC: 7.8 10*3/uL (ref 4.0–10.5)

## 2013-08-10 LAB — C-REACTIVE PROTEIN: CRP: 1.7 mg/dL — ABNORMAL HIGH (ref <0.60)

## 2013-08-10 LAB — BASIC METABOLIC PANEL
BUN: 31 mg/dL — ABNORMAL HIGH (ref 6–23)
CHLORIDE: 97 meq/L (ref 96–112)
CO2: 30 mEq/L (ref 19–32)
Calcium: 9.3 mg/dL (ref 8.4–10.5)
Creat: 1.05 mg/dL (ref 0.50–1.35)
Glucose, Bld: 102 mg/dL — ABNORMAL HIGH (ref 70–99)
POTASSIUM: 3.8 meq/L (ref 3.5–5.3)
Sodium: 137 mEq/L (ref 135–145)

## 2013-08-10 LAB — SEDIMENTATION RATE: Sed Rate: 96 mm/hr — ABNORMAL HIGH (ref 0–16)

## 2013-08-12 ENCOUNTER — Ambulatory Visit: Payer: Medicare Other | Admitting: Internal Medicine

## 2013-08-15 NOTE — Progress Notes (Signed)
Subjective:    Patient ID: Derek Frye, male    DOB: 10-Oct-1940, 73 y.o.   MRN: [id number]  HPI HPI 73 yo man with CAD, afib, numerous orthopedic surgeries for oa with joint replacements, hx of prior infection of left TKA in 2005 S/p I and D with resection arthroplasty and placement of antibiotic spacer on 09/28/09.last treated for PsA PJI in Summer 2014 s/p polyexchange in May 2014. He had swelling of right knee and placed on chronic suppression with doxyccyline $RemoveBeforeDE'100mg'[id number]$  BID in Oct 2014 for which he still remains on. In winter 2014, he was diagnosed with patellar hairline fracture that possibly causing his right knee pain.At his last visit in april, his inflammatory markers constantly elevated but previously had ESR 40s while on treatment, 60-80s at completion of prolonged IV antibiotics.  Clinical exam of his knee is fairly benign,with exception to not have full extention of leg. He denies fever, chills, nightsweats. Slight warmth to PJI of right knee. Trace edema. +1 right lower leg   Lab Results  Component Value Date   ESRSEDRATE 96* 08/10/2013   Lab Results  Component Value Date   CRP 1.7* 08/10/2013     Ortho hx:  2011: Intraoperative cultures grew Diphtheroids,CoNS and Stenotrophomonas( R TMP/SMX, FQ & S to mino) underwent left AKA on10/2012. At end of April 2014, He developed right knee prosthetic knee infection underwent I X D by Dr. Alvan Dame on 07/14/12 with polyexchange. OR cultures isolated Pseudomonas Aeruginosa ( cipro R, cefepime 8 s, ceftaz 4 S, gent 8 I, imi 1 S, tobra <1 S), finished 8 wks of meropenem. In mid-late October, He then had episode of increase swelling to the knee, where Dr. Alvan Dame attempted aspirate, which was a dry tape, and then placed him on doxycycline $RemoveBefore'100mg'[id number]$  BId for chronic suppression    Review of Systems 10 point ros negative except for right knee is has decreased rom thus unable to use prosthetic to left leg, essentially chair bound    Objective:   Physical  Exam BP 126/73  Pulse 56  Temp(Src) 97.6 F (36.4 C) (Oral) Physical Exam  Constitutional: He is oriented to person, place, and time. He appears well-developed and well-nourished. No distress.  HENT:  Mouth/Throat: Oropharynx is clear and moist. No oropharyngeal exudate.  Cardiovascular: Normal rate, regular rhythm and normal heart sounds. Exam reveals no gallop and no friction rub.  No murmur heard.  Pulmonary/Chest: Effort normal and breath sounds normal. No respiratory distress. He has no wheezes.  Abdominal: Soft. Bowel sounds are normal. He exhibits no distension. There is no tenderness.  Lymphadenopathy:  He has no cervical adenopathy.  Ext: + 1 edema to right leg. Right knee no effusion or erythema. Trace warmth Skin: Skin is warm and dry. No rash noted. No erythema.  Psychiatric: He has a normal mood and affect. His behavior is normal.       Assessment & Plan:  Chronic Prosthetic joint infection = unable to really use inflammatory markers as a reflection of infection in his knee. On physical exam, this continues to look stable, and not inflammed from arthritis nor infection. He is a poor candidate for any revision. For now would continue on doxycycline, consider switching to bactrim if he notices increased effusion or warmth. Prior hx of PsA was resistant to orals, thus would need to do another trial of IV antibiotics if felt that he had recurrence.  For now will watch for clinical symptoms to suggest recurrent of infections. Will continue  on doxycycline for now.

## 2013-08-23 ENCOUNTER — Ambulatory Visit (INDEPENDENT_AMBULATORY_CARE_PROVIDER_SITE_OTHER): Payer: Medicare Other | Admitting: *Deleted

## 2013-08-23 DIAGNOSIS — Z5181 Encounter for therapeutic drug level monitoring: Secondary | ICD-10-CM

## 2013-08-23 DIAGNOSIS — Z7901 Long term (current) use of anticoagulants: Secondary | ICD-10-CM

## 2013-08-23 DIAGNOSIS — I4891 Unspecified atrial fibrillation: Secondary | ICD-10-CM

## 2013-08-23 LAB — POCT INR: INR: 2.7

## 2013-08-31 ENCOUNTER — Ambulatory Visit (INDEPENDENT_AMBULATORY_CARE_PROVIDER_SITE_OTHER): Payer: Medicare Other | Admitting: Podiatry

## 2013-08-31 ENCOUNTER — Encounter: Payer: Self-pay | Admitting: Podiatry

## 2013-08-31 VITALS — BP 126/78 | HR 68 | Resp 16

## 2013-08-31 DIAGNOSIS — M79609 Pain in unspecified limb: Secondary | ICD-10-CM

## 2013-08-31 DIAGNOSIS — B351 Tinea unguium: Secondary | ICD-10-CM

## 2013-08-31 DIAGNOSIS — E1159 Type 2 diabetes mellitus with other circulatory complications: Secondary | ICD-10-CM

## 2013-08-31 DIAGNOSIS — M79671 Pain in right foot: Secondary | ICD-10-CM

## 2013-08-31 NOTE — Patient Instructions (Signed)
Diabetes and Foot Care Diabetes may cause you to have problems because of poor blood supply (circulation) to your feet and legs. This may cause the skin on your feet to become thinner, break easier, and heal more slowly. Your skin may become dry, and the skin may peel and crack. You may also have nerve damage in your legs and feet causing decreased feeling in them. You may not notice minor injuries to your feet that could lead to infections or more serious problems. Taking care of your feet is one of the most important things you can do for yourself.  HOME CARE INSTRUCTIONS  Wear shoes at all times, even in the house. Do not go barefoot. Bare feet are easily injured.  Check your feet daily for blisters, cuts, and redness. If you cannot see the bottom of your feet, use a mirror or ask someone for help.  Wash your feet with warm water (do not use hot water) and mild soap. Then pat your feet and the areas between your toes until they are completely dry. Do not soak your feet as this can dry your skin.  Apply a moisturizing lotion or petroleum jelly (that does not contain alcohol and is unscented) to the skin on your feet and to dry, brittle toenails. Do not apply lotion between your toes.  Trim your toenails straight across. Do not dig under them or around the cuticle. File the edges of your nails with an emery board or nail file.  Do not cut corns or calluses or try to remove them with medicine.  Wear clean socks or stockings every day. Make sure they are not too tight. Do not wear knee-high stockings since they may decrease blood flow to your legs.  Wear shoes that fit properly and have enough cushioning. To break in new shoes, wear them for just a few hours a day. This prevents you from injuring your feet. Always look in your shoes before you put them on to be sure there are no objects inside.  Do not cross your legs. This may decrease the blood flow to your feet.  If you find a minor scrape,  cut, or break in the skin on your feet, keep it and the skin around it clean and dry. These areas may be cleansed with mild soap and water. Do not cleanse the area with peroxide, alcohol, or iodine.  When you remove an adhesive bandage, be sure not to damage the skin around it.  If you have a wound, look at it several times a day to make sure it is healing.  Do not use heating pads or hot water bottles. They may burn your skin. If you have lost feeling in your feet or legs, you may not know it is happening until it is too late.  Make sure your health care provider performs a complete foot exam at least annually or more often if you have foot problems. Report any cuts, sores, or bruises to your health care provider immediately. SEEK MEDICAL CARE IF:   You have an injury that is not healing.  You have cuts or breaks in the skin.  You have an ingrown nail.  You notice redness on your legs or feet.  You feel burning or tingling in your legs or feet.  You have pain or cramps in your legs and feet.  Your legs or feet are numb.  Your feet always feel cold. SEEK IMMEDIATE MEDICAL CARE IF:   There is increasing redness,   swelling, or pain in or around a wound.  There is a red line that goes up your leg.  Pus is coming from a wound.  You develop a fever or as directed by your health care provider.  You notice a bad smell coming from an ulcer or wound. Document Released: 02/23/2000 Document Revised: 10/28/2012 Document Reviewed: 08/04/2012 [employer] Patient Information 2015 [employer]. This information is not intended to replace advice given to you by your health care provider. Make sure you discuss any questions you have with your health care provider.  

## 2013-08-31 NOTE — Progress Notes (Signed)
He presents today with a chief complaint of painful elongated toenails.  Objective: Nails are thick yellow dystrophic with mycotic painful palpation.  Assessment: Pain in limb secondary to onychomycosis 1 through 5 bilateral.  Plan: Debridement of nails in thickness and length as cover service secondary to pain.

## 2013-10-04 ENCOUNTER — Ambulatory Visit (INDEPENDENT_AMBULATORY_CARE_PROVIDER_SITE_OTHER): Payer: Medicare Other | Admitting: Pharmacist

## 2013-10-04 DIAGNOSIS — Z7901 Long term (current) use of anticoagulants: Secondary | ICD-10-CM

## 2013-10-04 DIAGNOSIS — I4891 Unspecified atrial fibrillation: Secondary | ICD-10-CM

## 2013-10-04 DIAGNOSIS — Z5181 Encounter for therapeutic drug level monitoring: Secondary | ICD-10-CM

## 2013-10-04 LAB — POCT INR: INR: 3.1

## 2013-11-10 ENCOUNTER — Ambulatory Visit (INDEPENDENT_AMBULATORY_CARE_PROVIDER_SITE_OTHER): Payer: Medicare Other | Admitting: Internal Medicine

## 2013-11-10 ENCOUNTER — Encounter: Payer: Self-pay | Admitting: Internal Medicine

## 2013-11-10 VITALS — BP 116/61 | HR 53 | Temp 97.5°F

## 2013-11-10 DIAGNOSIS — T889XXS Complication of surgical and medical care, unspecified, sequela: Secondary | ICD-10-CM

## 2013-11-10 DIAGNOSIS — I6529 Occlusion and stenosis of unspecified carotid artery: Secondary | ICD-10-CM

## 2013-11-10 DIAGNOSIS — Z23 Encounter for immunization: Secondary | ICD-10-CM

## 2013-11-10 DIAGNOSIS — T8450XS Infection and inflammatory reaction due to unspecified internal joint prosthesis, sequela: Secondary | ICD-10-CM

## 2013-11-10 LAB — SEDIMENTATION RATE: SED RATE: 127 mm/h — AB (ref 0–16)

## 2013-11-10 NOTE — Progress Notes (Signed)
Subjective:    Patient ID: Derek Frye, male    DOB: 1940/04/21, 73 y.o.   MRN: [id number]  HPI  HPI 73 yo man with CAD, afib, numerous orthopedic surgeries for oa with joint replacements, hx of prior infection of left TKA in 2005 S/p I and D with resection arthroplasty and placement of antibiotic spacer on 09/28/09.last treated for PsA PJI in Summer 2014 s/p polyexchange in May 2014. He had swelling of right knee and placed on chronic suppression with doxycyline 143m BID in Oct 2014 for which he still remains on. In winter 2014, he was diagnosed with patellar hairline fracture that possibly causing his right knee pain.At his last visit in april, his inflammatory markers constantly elevated but previously had ESR 40s while on treatment, 60-80s at completion of prolonged IV antibiotics. Clinical exam of his knee is fairly benign,with exception to not have full extention of leg. He denies fever, chills, nightsweats. No warmth to his PJI of right knee. Trace edema. He last saw dr. OAlvan Damein June who is now seeing him on an annual basis. No recent illnesses in teh last 3 months. No difficulties with doxycycline for chronic suppression.  Current Outpatient Prescriptions on File Prior to Visit  Medication Sig Dispense Refill  . doxycycline (VIBRA-TABS) 100 MG tablet TAKE 1 TABLET TWICE A DAY  180 tablet  0  . furosemide (LASIX) 40 MG tablet Take 40 mg by mouth daily. Take one (1) tablet by mouth daily.      .Marland Kitchengabapentin (NEURONTIN) 100 MG capsule Take 100 mg by mouth 3 (three) times daily as needed. For phantom pain      . magnesium gluconate (MAGONATE) 500 MG tablet Take 500 mg by mouth daily.       . metFORMIN (GLUCOPHAGE) 850 MG tablet Take 850 mg by mouth 2 (two) times daily with a meal.       . metolazone (ZAROXOLYN) 2.5 MG tablet daily.       . potassium chloride SA (K-DUR,KLOR-CON) 20 MEQ tablet Take 40 mEq by mouth every evening.       . simvastatin (ZOCOR) 40 MG tablet Take 1 tablet (40 mg  total) by mouth at bedtime.  90 tablet  3  . spironolactone (ALDACTONE) 25 MG tablet Take 25 mg by mouth 2 (two) times daily. Take one (1) tablet by mouth two (2) times a day.      . warfarin (COUMADIN) 2.5 MG tablet Take as directed by anticoagulation clinic  90 tablet  1   No current facility-administered medications on file prior to visit.   Active Ambulatory Problems    Diagnosis Date Noted  . DYSLIPIDEMIA 11/08/2008  . Fluid overload 11/08/2008  . RBBB 11/09/2008  . AAA 11/08/2008  . PERIPHERAL VASCULAR DISEASE 12/19/2009  . CELLULITIS AND ABSCESS OF LEG EXCEPT FOOT 12/19/2009  . Pyogenic arthritis, lower leg 12/19/2009  . TOBACCO ABUSE, HX OF 11/08/2008  . STREPTOCOCCUS INFECTION CCE & UNS SITE GROUP B 02/28/2010  . Encounter for long-term (current) use of anticoagulants 06/11/2010  . HTN (hypertension)   . Obesity   . CAD (coronary artery disease)   . Hyperlipidemia   . Cigarette smoker   . Atrial fibrillation   . Volume overload   . Right bundle branch block   . Warfarin anticoagulation   . Carotid artery disease   . 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 Gram-negative infection 11/19/2010  . Ejection fraction < 50%   .  Preop cardiovascular exam   . Cellulitis and abscess of lower leg 07/07/2012  . Type II or unspecified type diabetes mellitus with peripheral circulatory disorders, uncontrolled(250.72) 07/17/2012  . Unspecified constipation 07/20/2012  . Bradycardia   . Encounter for therapeutic drug monitoring 04/09/2013  . Aftercare following surgery of the circulatory system, NEC 07/05/2013   Resolved Ambulatory Problems    Diagnosis Date Noted  . No Resolved Ambulatory Problems   Past Medical History  Diagnosis Date  . History of cholecystectomy   . Total knee replacement status   . Prostate cancer   . Abdominal aortic aneurysm   . Diabetes mellitus   . Myocardial infarction 1984  . Ulcer   . Peripheral vascular disease   . Heart murmur     . Arthritis   . Tremor       Review of Systems 10 point ros is negative    Objective:   Physical Exam  BP 116/61  Pulse 53  Temp(Src) 97.5 F (36.4 C) (Oral) gen = a x o by 3 in nad Right knee= surgical scar well healed, no effusion, no warmth. Trace edema to lower leg, decreased range of motion unchanged from prior  Lab Results  Component Value Date   ESRSEDRATE 96* 08/10/2013   Lab Results  Component Value Date   CRP 1.7* 08/10/2013       Assessment & Plan:  Chronic prosthetic joint infection = will check sed rate and crp. contineu on doxycycline 100mg BID  Health maintenance =gave flu shot at this visit  rtc in 3 months 

## 2013-11-11 ENCOUNTER — Telehealth: Payer: Self-pay

## 2013-11-11 LAB — C-REACTIVE PROTEIN: CRP: 0.9 mg/dL — ABNORMAL HIGH (ref <0.60)

## 2013-11-11 NOTE — Telephone Encounter (Signed)
Patient calling for lab results.  Please advise.   Laverle Patter, RN

## 2013-11-12 NOTE — Telephone Encounter (Signed)
Pt returned call, wondering about medication change.

## 2013-11-16 ENCOUNTER — Ambulatory Visit (INDEPENDENT_AMBULATORY_CARE_PROVIDER_SITE_OTHER): Payer: Medicare Other | Admitting: Pharmacist Clinician (PhC)/ Clinical Pharmacy Specialist

## 2013-11-16 DIAGNOSIS — I4891 Unspecified atrial fibrillation: Secondary | ICD-10-CM

## 2013-11-16 DIAGNOSIS — Z5181 Encounter for therapeutic drug level monitoring: Secondary | ICD-10-CM

## 2013-11-16 DIAGNOSIS — Z7901 Long term (current) use of anticoagulants: Secondary | ICD-10-CM

## 2013-11-16 LAB — POCT INR: INR: 2.8

## 2013-11-17 ENCOUNTER — Telehealth: Payer: Self-pay | Admitting: *Deleted

## 2013-11-17 NOTE — Telephone Encounter (Signed)
Pt symptomatic, chills, fever for past 3 days.  Pt's wife giving the pt Tylenol for the fever.  She is concerned about his worsening symptoms and the lab results from last week.  Requesting a phone call to discuss these issues and any change in antibiotics.

## 2013-11-17 NOTE — Telephone Encounter (Signed)
Called and spoke with wife.  Fevers remitting.  Will continue with doxycycline for now and discuss with Dr. Baxter Flattery when she returns.

## 2013-11-19 ENCOUNTER — Ambulatory Visit: Payer: Medicare Other | Admitting: *Deleted

## 2013-11-19 DIAGNOSIS — E1169 Type 2 diabetes mellitus with other specified complication: Secondary | ICD-10-CM

## 2013-11-22 NOTE — Telephone Encounter (Signed)
Can you call family to ask them to do a fever curve for 2 wks. Taking temp when he feels like he is having chills, then take it twice a day, once in am and once in pm. Have him over booked in 2 wk

## 2013-11-22 NOTE — Telephone Encounter (Signed)
Requesting call from Dr. Baxter Flattery to discuss lab results.

## 2013-11-22 NOTE — Telephone Encounter (Signed)
Wife in agreement to keep fever log per Dr. Storm Frisk instructions.   Return appt made for 12/08/13 w/ Dr. Baxter Flattery.

## 2013-12-03 ENCOUNTER — Ambulatory Visit: Payer: Medicare Other | Admitting: Cardiology

## 2013-12-06 ENCOUNTER — Encounter: Payer: Self-pay | Admitting: Cardiology

## 2013-12-06 DIAGNOSIS — T8450XA Infection and inflammatory reaction due to unspecified internal joint prosthesis, initial encounter: Secondary | ICD-10-CM | POA: Insufficient documentation

## 2013-12-08 ENCOUNTER — Encounter: Payer: Self-pay | Admitting: Internal Medicine

## 2013-12-08 ENCOUNTER — Ambulatory Visit (INDEPENDENT_AMBULATORY_CARE_PROVIDER_SITE_OTHER): Payer: Medicare Other | Admitting: Internal Medicine

## 2013-12-08 ENCOUNTER — Ambulatory Visit (INDEPENDENT_AMBULATORY_CARE_PROVIDER_SITE_OTHER): Payer: Medicare Other | Admitting: Cardiology

## 2013-12-08 ENCOUNTER — Encounter: Payer: Self-pay | Admitting: Cardiology

## 2013-12-08 VITALS — BP 125/69 | HR 71 | Temp 98.0°F

## 2013-12-08 VITALS — BP 118/56 | HR 73 | Ht 71.0 in | Wt 216.0 lb

## 2013-12-08 DIAGNOSIS — T8450XS Infection and inflammatory reaction due to unspecified internal joint prosthesis, sequela: Secondary | ICD-10-CM

## 2013-12-08 DIAGNOSIS — F17200 Nicotine dependence, unspecified, uncomplicated: Secondary | ICD-10-CM

## 2013-12-08 DIAGNOSIS — I779 Disorder of arteries and arterioles, unspecified: Secondary | ICD-10-CM

## 2013-12-08 DIAGNOSIS — T889XXS Complication of surgical and medical care, unspecified, sequela: Secondary | ICD-10-CM

## 2013-12-08 DIAGNOSIS — I4891 Unspecified atrial fibrillation: Secondary | ICD-10-CM

## 2013-12-08 DIAGNOSIS — I6529 Occlusion and stenosis of unspecified carotid artery: Secondary | ICD-10-CM

## 2013-12-08 DIAGNOSIS — I482 Chronic atrial fibrillation, unspecified: Secondary | ICD-10-CM

## 2013-12-08 DIAGNOSIS — R509 Fever, unspecified: Secondary | ICD-10-CM

## 2013-12-08 DIAGNOSIS — I739 Peripheral vascular disease, unspecified: Secondary | ICD-10-CM

## 2013-12-08 DIAGNOSIS — I25810 Atherosclerosis of coronary artery bypass graft(s) without angina pectoris: Secondary | ICD-10-CM

## 2013-12-08 DIAGNOSIS — F17210 Nicotine dependence, cigarettes, uncomplicated: Secondary | ICD-10-CM

## 2013-12-08 LAB — RHEUMATOID FACTOR: RHEUMATOID FACTOR: 14 [IU]/mL (ref <=14)

## 2013-12-08 LAB — C-REACTIVE PROTEIN: CRP: 1.3 mg/dL — ABNORMAL HIGH (ref <0.60)

## 2013-12-08 LAB — SEDIMENTATION RATE: Sed Rate: 49 mm/hr — ABNORMAL HIGH (ref 0–16)

## 2013-12-08 NOTE — Assessment & Plan Note (Signed)
I talked to him about his cigar smoking. I encouraged him to stop.

## 2013-12-08 NOTE — Assessment & Plan Note (Signed)
Carotid disease being followed and is stable. No change in therapy.

## 2013-12-08 NOTE — Assessment & Plan Note (Signed)
Coronary disease is stable. No change in therapy. 

## 2013-12-08 NOTE — Progress Notes (Signed)
Rfv: sporadic fever Subjective:    Patient ID: Derek Frye, male    DOB: 1941-01-01, 73 y.o.   MRN: [id number]  HPI HPI 73 yo man with CAD, afib, numerous orthopedic surgeries for oa with joint replacements, hx of prior infection of left TKA in 2005 S/p I and D with resection arthroplasty and placement of antibiotic spacer on 09/28/09. 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 He had swelling of right knee and placed on chronic suppression with doxycyline $RemoveBeforeD'100mg'XbMfppjqfvUaji$  BID since Oct 2014 to the present. In winter 2014, he was diagnosed with patellar hairline fracture that possibly causing his right knee pain. In  april, his inflammatory markers constantly elevated but previously had ESR 40s while on treatment, 60-80s at completion of prolonged IV antibiotics. Clinical exam of his knee is fairly benign,with exception to not have full extention of leg. He was last seen in beginning of September where he was given flu vaccine and lab work which revealed sed rate > 100. His knee exam remained unremarkable. The week after his visit, he had 2-3 days of chills, fever of 100.6 that was not lasting. He did not have any GI or URI symptoms to accompany nor any sick contact. He has seen other physicians in the meantime. He reports that his PSA is elevated as part of surveillance for prostate ca s/p radioactive seed. He denies any swelling, warmth or redness of his right knee  Allergies  Allergen Reactions  . Cefepime     ? tremors  . Amoxicillin     REACTION: swelling and a rash  . Morphine Nausea And Vomiting   Current Outpatient Prescriptions on File Prior to Visit  Medication Sig Dispense Refill  . doxycycline (VIBRA-TABS) 100 MG tablet TAKE 1 TABLET TWICE A DAY  180 tablet  0  . furosemide (LASIX) 40 MG tablet Take 40 mg by mouth daily. Take one (1) tablet by mouth daily.      Marland Kitchen gabapentin (NEURONTIN) 100 MG capsule Take  100 mg by mouth 3 (three) times daily as needed. For phantom pain      . magnesium gluconate (MAGONATE) 500 MG tablet Take 500 mg by mouth daily.       . metFORMIN (GLUCOPHAGE) 850 MG tablet Take 850 mg by mouth 2 (two) times daily with a meal.       . metolazone (ZAROXOLYN) 2.5 MG tablet daily.       . potassium chloride SA (K-DUR,KLOR-CON) 20 MEQ tablet Take 40 mEq by mouth every evening.       . simvastatin (ZOCOR) 40 MG tablet Take 1 tablet (40 mg total) by mouth at bedtime.  90 tablet  3  . spironolactone (ALDACTONE) 25 MG tablet Take 25 mg by mouth 2 (two) times daily. Take one (1) tablet by mouth two (2) times a day.      . warfarin (COUMADIN) 2.5 MG tablet Take as directed by anticoagulation clinic  90 tablet  1   No current facility-administered medications on file prior to visit.   Active Ambulatory Problems    Diagnosis Date Noted  . DYSLIPIDEMIA 11/08/2008  . Fluid overload 11/08/2008  . AAA 11/08/2008  . CELLULITIS AND ABSCESS OF LEG EXCEPT FOOT 12/19/2009  . Pyogenic arthritis, lower leg 12/19/2009  . TOBACCO ABUSE, HX OF 11/08/2008  . STREPTOCOCCUS INFECTION CCE & UNS SITE GROUP B 02/28/2010  . Encounter for long-term (current) use of  anticoagulants 06/11/2010  . HTN (hypertension)   . Obesity   . CAD (coronary artery disease)   . Hyperlipidemia   . Cigarette smoker   . Atrial fibrillation   . Volume overload   . Right bundle branch block   . Warfarin anticoagulation   . Carotid artery disease   . 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 Gram-negative infection 11/19/2010  . Ejection fraction < 50%   . Preop cardiovascular exam   . Cellulitis and abscess of lower leg 07/07/2012  . Type II or unspecified type diabetes mellitus with peripheral circulatory disorders, uncontrolled(250.72) 07/17/2012  . Unspecified constipation 07/20/2012  . Bradycardia   . Encounter for therapeutic drug monitoring 04/09/2013  . Aftercare following surgery of the  circulatory system, [place] 07/05/2013  . 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 No Resolved Ambulatory Problems   Past Medical History  Diagnosis Date  . History of cholecystectomy   . Total knee replacement status   . Prostate cancer   . Abdominal aortic aneurysm   . Diabetes mellitus   . Myocardial infarction 1984  . Ulcer   . Peripheral vascular disease   . Heart murmur   . Arthritis   . Tremor        Review of Systems     Objective:   Physical Exam BP 125/69  Pulse 71  Temp(Src) 98 F (36.7 C) (Oral) Physical Exam  Constitutional: He is oriented to person, place, and time. He appears well-developed and well-nourished. No distress.  HENT:  Ext: left leg amputation. Right  Knee slightly warm no effusion, no redness, decrease range of motion, unhchanged. . +1 edema to lower right leg Skin = hyperpigmented c/w pvd Neuro = full range of motion and equal strength in shoulder/arms. 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 Does not appear to be PMR. Will check spep and auto immune panel. Knee exam looks benign.  rtc in 4-6 wk

## 2013-12-08 NOTE — Assessment & Plan Note (Signed)
He continues with infectious disease followup and doxycycline therapy.

## 2013-12-08 NOTE — Progress Notes (Signed)
Patient ID: Derek Frye, male   DOB: 22-Aug-1940, 73 y.o.   MRN: [id number]    HPI  Patient is seen to followup coronary disease. He's not had any cardiac symptoms. He is followed carefully by infectious disease for his chronic joint replacement infection. He takes doxycycline twice daily. Unfortunately he is smoking. He says he smokes cigars but does not inhale. He's also use some vapor cigarettes. I've explained to him that this contains nicotine and is no better for him.  Allergies  Allergen Reactions  . Cefepime     ? tremors  . Amoxicillin     REACTION: swelling and a rash  . Morphine Nausea And Vomiting    Current Outpatient Prescriptions  Medication Sig Dispense Refill  . doxycycline (VIBRA-TABS) 100 MG tablet TAKE 1 TABLET TWICE A DAY  180 tablet  0  . furosemide (LASIX) 40 MG tablet Take 40 mg by mouth daily. Take one (1) tablet by mouth daily.      Marland Kitchen gabapentin (NEURONTIN) 100 MG capsule Take 100 mg by mouth 3 (three) times daily as needed. For phantom pain      . magnesium gluconate (MAGONATE) 500 MG tablet Take 500 mg by mouth daily.       . metFORMIN (GLUCOPHAGE) 850 MG tablet Take 850 mg by mouth 2 (two) times daily with a meal.       . metolazone (ZAROXOLYN) 2.5 MG tablet daily.       . potassium chloride SA (K-DUR,KLOR-CON) 20 MEQ tablet Take 40 mEq by mouth every evening.       . simvastatin (ZOCOR) 40 MG tablet Take 1 tablet (40 mg total) by mouth at bedtime.  90 tablet  3  . spironolactone (ALDACTONE) 25 MG tablet Take 25 mg by mouth 2 (two) times daily. Take one (1) tablet by mouth two (2) times a day.      . warfarin (COUMADIN) 2.5 MG tablet Take as directed by anticoagulation clinic  90 tablet  1   No current facility-administered medications for this visit.    History   Social History  . Marital Status: Married    Spouse Name: N/A    Number of Children: N/A  . Years of Education: N/A   Occupational History  . Not on file.   Social History Main  Topics  . Smoking status: Current Every Day Smoker -- 0.50 packs/day for 30 years    Types: Cigarettes, Cigars    Start date: 07/07/2012  . Smokeless tobacco: Never Used     Comment: using E cig too  . Alcohol Use: No  . Drug Use: No  . Sexual Activity: Not on file   Other Topics Concern  . Not on file   Social History Narrative  . No narrative on file    Family History  Problem Relation Age of Onset  . Stroke Father 26  . Hypertension Father   . Diabetes Other   . Diabetes Mother   . Cancer Sister   . Diabetes Sister   . Cancer Brother   . Diabetes Brother   . Heart disease Brother     Past Medical History  Diagnosis Date  . HTN (hypertension)   . Obesity   . History of cholecystectomy   . CAD (coronary artery disease)     Status post CABG followed by redo in 1994 / nuclear 2007, old infarct, no ischemia  . Ejection fraction < 50%     EF  45%...echo 2004 /  EF 45%, echo, August, 2012  . Hyperlipidemia   . Cigarette smoker     He is now smoking cigars and needs to quit.  . 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 no replacement until leg is stable  . Prostate cancer     History of prostate cancer with a seed implant and this is stable.  . Abdominal aortic aneurysm     is stable, being followed  . Atrial fibrillation     Rate control / going in and out of atrial fib, Coumadin  . Volume overload   . Right bundle branch block     noted November 09, 2008.  . Warfarin anticoagulation     Atrial fibrillation  . Carotid artery disease     Doppler September 2 761, 60-73% LICA / Doppler October, 2010, 0-39% R. ICA 71-06% LICA  . Diabetes mellitus   . Myocardial infarction 1984  . Ulcer   . Peripheral vascular disease   . 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 Heart murmur   . Arthritis   . Tremor     ? caused via by antibiotic currently taking  . Bradycardia     Past Surgical History    Procedure Laterality Date  . Total knee arthroplasty    . Coronary artery bypass graft  1994    x2  . Anal fissure repair    . Pr vein bypass graft,aorto-fem-pop    . Leg amputation above knee  Oct. 23, 2012    Left Leg AKA  . Joint replacement      bil knee  . Abdominal aortic endovascular stent graft N/A 04/30/2012    Procedure:  Ultrasound guided,   inserstion of  ABDOMINAL AORTIC ENDOVASCULAR STENT GRAFT;  Surgeon: [name], MD;  Location: [facility];  Service: Vascular;  Laterality: N/A;  EVAR- [place]  . I&d knee with poly exchange Right 07/13/2012    Procedure: IRRIGATION AND DEBRIDEMENT KNEE WITH POLY EXCHANGE;  Surgeon: [name], MD;  Location: [facility];  Service: Orthopedics;  Laterality: Right;    Patient Active Problem List   Diagnosis Date Noted  . Ejection fraction < 50%     Priority: High  . 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 Aftercare following surgery of the circulatory system, [place] 07/05/2013  . Encounter for therapeutic drug monitoring 04/09/2013  . Bradycardia   . Unspecified constipation 07/20/2012  . Type II or unspecified type diabetes mellitus with peripheral circulatory disorders, uncontrolled(250.72) 07/17/2012  . Cellulitis and abscess of lower leg 07/07/2012  . Preop cardiovascular exam   . 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 Gram-negative infection 11/19/2010  . HTN (hypertension)   . Obesity   . CAD (coronary artery disease)   . Hyperlipidemia   . Cigarette smoker   . Atrial fibrillation   . Volume overload   . Right bundle branch block   . Warfarin anticoagulation   . Carotid artery disease   . Encounter for long-term (current) use of anticoagulants 06/11/2010  . STREPTOCOCCUS INFECTION CCE & UNS SITE GROUP B 02/28/2010  . CELLULITIS AND ABSCESS OF LEG EXCEPT FOOT 12/19/2009  . Pyogenic arthritis, lower leg 12/19/2009  . DYSLIPIDEMIA 11/08/2008  . Fluid overload 11/08/2008  . AAA 11/08/2008  . TOBACCO ABUSE, HX OF  11/08/2008    ROS   Patient denies fever, chills, headache, sweats, rash, change in vision, change  in hearing, chest pain, cough, nausea vomiting, urinary symptoms. All other systems are reviewed and are negative.  PHYSICAL EXAM  Patient is oriented to person time and place. Affect is normal. He is here with his wife. He is in a wheelchair. He smells of cigarettes. Head is atraumatic. Lungs reveal scattered rhonchi. There is no respiratory distress. Cardiac exam reveals S1 and S2. He has an above-the-knee amputation on the left. I did not fully examine his right knee. Abdomen is soft.  Filed Vitals:   12/08/13 1547  BP: 118/56  Pulse: 73  Height: 5\' 11"  (1.803 m)  Weight: 216 lb (97.977 kg)   EKG reveals old atrial fibrillation with a controlled rate. There is old right bundle branch block with old atrial fibrillation. There is no significant change.  ASSESSMENT & PLAN

## 2013-12-08 NOTE — Assessment & Plan Note (Signed)
Atrial fibrillation rate is controlled. He is anticoagulated. No change in therapy.

## 2013-12-08 NOTE — Patient Instructions (Signed)
Your physician recommends that you continue on your current medications as directed. Please refer to the Current Medication list given to you today.  Your physician wants you to follow-up in: 6 months. You will receive a reminder letter in the mail two months in advance. If you don't receive a letter, please call our office to schedule the follow-up appointment.  

## 2013-12-09 LAB — ANA: Anti Nuclear Antibody(ANA): NEGATIVE

## 2013-12-10 LAB — PROTEIN ELECTROPHORESIS, SERUM
ALPHA-2-GLOBULIN: 11.7 % (ref 7.1–11.8)
Albumin ELP: 50.5 % — ABNORMAL LOW (ref 55.8–66.1)
Alpha-1-Globulin: 4.8 % (ref 2.9–4.9)
Beta 2: 5.5 % (ref 3.2–6.5)
Beta Globulin: 7.5 % — ABNORMAL HIGH (ref 4.7–7.2)
GAMMA GLOBULIN: 20 % — AB (ref 11.1–18.8)
Total Protein, Serum Electrophoresis: 7.3 g/dL (ref 6.0–8.3)

## 2013-12-14 ENCOUNTER — Ambulatory Visit (INDEPENDENT_AMBULATORY_CARE_PROVIDER_SITE_OTHER): Payer: Medicare Other | Admitting: Podiatry

## 2013-12-14 ENCOUNTER — Encounter: Payer: Self-pay | Admitting: Podiatry

## 2013-12-14 DIAGNOSIS — B351 Tinea unguium: Secondary | ICD-10-CM

## 2013-12-14 DIAGNOSIS — M2041 Other hammer toe(s) (acquired), right foot: Secondary | ICD-10-CM

## 2013-12-14 DIAGNOSIS — M79671 Pain in right foot: Secondary | ICD-10-CM

## 2013-12-14 DIAGNOSIS — E1142 Type 2 diabetes mellitus with diabetic polyneuropathy: Secondary | ICD-10-CM

## 2013-12-14 DIAGNOSIS — E1151 Type 2 diabetes mellitus with diabetic peripheral angiopathy without gangrene: Secondary | ICD-10-CM

## 2013-12-14 NOTE — Progress Notes (Signed)
He presents today to pick up his diabetic shoes. He also like to have his toenails one through 5 of the right foot.  Objective: Pulses remain palpable to the right foot. Nails are thick yellow dystrophic and mycotic.  Assessment pain in limb secondary to onychomycosis 1 through 5 of the right foot.  Plan: Debridement nails 1 through 5 of the right foot. Dispensed diabetic shoes today.

## 2013-12-16 ENCOUNTER — Other Ambulatory Visit: Payer: Self-pay | Admitting: Internal Medicine

## 2013-12-16 DIAGNOSIS — Z96659 Presence of unspecified artificial knee joint: Principal | ICD-10-CM

## 2013-12-16 DIAGNOSIS — T8459XS Infection and inflammatory reaction due to other internal joint prosthesis, sequela: Secondary | ICD-10-CM

## 2013-12-24 ENCOUNTER — Other Ambulatory Visit: Payer: Self-pay

## 2013-12-28 ENCOUNTER — Ambulatory Visit (INDEPENDENT_AMBULATORY_CARE_PROVIDER_SITE_OTHER): Payer: Medicare Other

## 2013-12-28 DIAGNOSIS — Z5181 Encounter for therapeutic drug level monitoring: Secondary | ICD-10-CM

## 2013-12-28 DIAGNOSIS — Z7901 Long term (current) use of anticoagulants: Secondary | ICD-10-CM

## 2013-12-28 DIAGNOSIS — I4891 Unspecified atrial fibrillation: Secondary | ICD-10-CM

## 2013-12-28 LAB — POCT INR: INR: 3.1

## 2014-01-17 ENCOUNTER — Ambulatory Visit: Payer: Medicare Other | Admitting: Surgery

## 2014-01-17 ENCOUNTER — Other Ambulatory Visit (HOSPITAL_COMMUNITY): Payer: Medicare Other

## 2014-01-19 ENCOUNTER — Encounter: Payer: Self-pay | Admitting: Internal Medicine

## 2014-01-19 ENCOUNTER — Ambulatory Visit (INDEPENDENT_AMBULATORY_CARE_PROVIDER_SITE_OTHER): Payer: Medicare Other | Admitting: Internal Medicine

## 2014-01-19 VITALS — BP 135/71 | HR 73 | Temp 98.8°F

## 2014-01-19 DIAGNOSIS — T8450XS Infection and inflammatory reaction due to unspecified internal joint prosthesis, sequela: Secondary | ICD-10-CM

## 2014-01-19 DIAGNOSIS — I6529 Occlusion and stenosis of unspecified carotid artery: Secondary | ICD-10-CM

## 2014-01-19 NOTE — Progress Notes (Signed)
Subjective:    Patient ID: Derek Frye, male    DOB: 04-15-1940, 73 y.o.   MRN: [id number]  HPI 73 yo man with CAD, afib, numerous orthopedic surgeries for oa with joint replacements, hx of prior infection of left TKA in 2005 S/p I and D with resection arthroplasty and placement of antibiotic spacer on 09/28/09.last treated for PsA PJI in Summer 2014 s/p polyexchange in May 2014 c/b hairline fracture of patella. He had swelling of right knee and placed on chronic suppression with doxycyline 100mg  BID since Oct 2014 for which he still remains on.  Clinical exam of his knee is fairly benign,with exception to not have full extention of leg. He denies fever, chills, nightsweats. No warmth to his PJI of right knee. Trace edema. He last saw dr. Alvan Dame in June who is now seeing him on an annual basis. No recent illnesses. No difficulties with doxycycline for chronic suppression. He is starting to have contracture to his right leg with only minimal range of motion bent at 90 degree, most difficulty with full extension of his leg. He rarely places on his left leg prosthesis and uses walker, due to contracture, unsteadiness, and deconditioning. His inflammatory markers are the lowest they have been in several months despite no change in regimen    Lab Results  Component Value Date   ESRSEDRATE 49* 12/08/2013   Lab Results  Component Value Date   CRP 1.3* 12/08/2013   Current Outpatient Prescriptions on File Prior to Visit  Medication Sig Dispense Refill  . doxycycline (VIBRA-TABS) 100 MG tablet TAKE 1 TABLET TWICE A DAY 180 tablet 2  . furosemide (LASIX) 40 MG tablet Take 40 mg by mouth daily. Take one (1) tablet by mouth daily.    Marland Kitchen gabapentin (NEURONTIN) 100 MG capsule Take 100 mg by mouth 3 (three) times daily as needed. For phantom pain    . magnesium gluconate (MAGONATE) 500 MG tablet Take 500 mg by mouth daily.     . metFORMIN (GLUCOPHAGE) 850 MG tablet Take 850 mg by mouth 2 (two) times  daily with a meal.     . metolazone (ZAROXOLYN) 2.5 MG tablet daily.     . potassium chloride SA (K-DUR,KLOR-CON) 20 MEQ tablet Take 40 mEq by mouth every evening.     . simvastatin (ZOCOR) 40 MG tablet Take 1 tablet (40 mg total) by mouth at bedtime. 90 tablet 3  . spironolactone (ALDACTONE) 25 MG tablet Take 25 mg by mouth 2 (two) times daily. Take one (1) tablet by mouth two (2) times a day.    . warfarin (COUMADIN) 2.5 MG tablet Take as directed by anticoagulation clinic 90 tablet 1   No current facility-administered medications on file prior to visit.   Active Ambulatory Problems    Diagnosis Date Noted  . DYSLIPIDEMIA 11/08/2008  . Fluid overload 11/08/2008  . AAA 11/08/2008  . CELLULITIS AND ABSCESS OF LEG EXCEPT FOOT 12/19/2009  . TOBACCO ABUSE, HX OF 11/08/2008  . STREPTOCOCCUS INFECTION CCE & UNS SITE GROUP B 02/28/2010  . Encounter for long-term (current) use of anticoagulants 06/11/2010  . HTN (hypertension)   . Obesity   . CAD (coronary artery disease)   . Hyperlipidemia   . Cigarette smoker   . Atrial fibrillation   . Volume overload   . Right bundle branch block   . Warfarin anticoagulation   . Carotid artery disease   . 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 Gram-negative infection  11/19/2010  . Ejection fraction < 50%   . Cellulitis and abscess of lower leg 07/07/2012  . Type II or unspecified type diabetes mellitus with peripheral circulatory disorders, uncontrolled(250.72) 07/17/2012  . Unspecified constipation 07/20/2012  . Bradycardia   . Encounter for therapeutic drug monitoring 04/09/2013  . Aftercare following surgery of the circulatory system, [place] 07/05/2013  . 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 No Resolved Ambulatory Problems   Past Medical History  Diagnosis Date  . History of cholecystectomy   . Total knee replacement status   . Prostate cancer   . Abdominal aortic  aneurysm   . Diabetes mellitus   . Myocardial infarction 1984  . Ulcer   . Peripheral vascular disease   . Preop cardiovascular exam   . Heart murmur   . Arthritis   . Tremor       Review of Systems  10 point ros is negative     Objective:   Physical Exam  BP 135/71 mmHg  Pulse 73  Temp(Src) 98.8 F (37.1 C) (Oral) Physical Exam  Constitutional: He is oriented to person, place, and time. He appears his stated age but chronically ill and in no acute distress.  HENT:  Mouth/Throat: Oropharynx is clear and moist. No oropharyngeal exudate.  Cardiovascular: Normal rate, regular rhythm and normal heart sounds. Exam reveals no gallop and no friction rub.  No murmur heard.  Pulmonary/Chest: Effort normal and breath sounds normal. No respiratory distress. He has no wheezes.  Ext: left bka. Right knee surgical scar, no swelling, erythema, or warmth. Right lower leg has chronic venous insufficiency, hyperpigmentation and trace edema  Skin: Skin is warm and dry. No rash noted. No erythema.      Assessment & Plan:   chronic pji of right knee = will continue on doxycycline 100mg  bid for now. We will have him come back in 2 months to see how he is doing, consider rechecking inflammatory markers nad decide when to stop doxycycline, since this was added empirically. 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  Elzie Rings [facility] [phone]

## 2014-01-21 ENCOUNTER — Encounter: Payer: Self-pay | Admitting: Surgery

## 2014-01-24 ENCOUNTER — Ambulatory Visit (HOSPITAL_COMMUNITY)
Admission: RE | Admit: 2014-01-24 | Discharge: 2014-01-24 | Disposition: A | Discharge status: Home / Self Care | Payer: Medicare Other | Source: Ambulatory Visit | Attending: Surgery | Admitting: Surgery

## 2014-01-24 ENCOUNTER — Ambulatory Visit (INDEPENDENT_AMBULATORY_CARE_PROVIDER_SITE_OTHER): Payer: Medicare Other | Admitting: Surgery

## 2014-01-24 ENCOUNTER — Encounter: Payer: Self-pay | Admitting: Surgery

## 2014-01-24 VITALS — BP 121/57 | HR 54 | Temp 97.8°F | Resp 16 | Ht 71.0 in | Wt 215.0 lb

## 2014-01-24 DIAGNOSIS — I714 Abdominal aortic aneurysm, without rupture, unspecified: Secondary | ICD-10-CM

## 2014-01-24 DIAGNOSIS — Z48812 Encounter for surgical aftercare following surgery on the circulatory system: Secondary | ICD-10-CM | POA: Diagnosis not present

## 2014-01-24 DIAGNOSIS — Z95828 Presence of other vascular implants and grafts: Secondary | ICD-10-CM

## 2014-01-24 DIAGNOSIS — I6529 Occlusion and stenosis of unspecified carotid artery: Secondary | ICD-10-CM

## 2014-01-24 NOTE — Patient Instructions (Signed)
Please review the tobacco cessation information given to you today. It lists many hints that are useful in your effort to stop smoking. The [facility] contact phone # is [phone] These nurses and advisors offer lots of FREE information and aids to help you quit.    The [facility]uit Smoking line #  [phone], they will also assist you with programs designed to help you stop smoking.    

## 2014-01-24 NOTE — Progress Notes (Signed)
Patient name: Derek Frye MRN: [id number] DOB: 17-Apr-1940 Sex: male     Chief Complaint  Patient presents with  . Follow-up    6 month EVAR follow up   Pt has no complaints    HISTORY OF PRESENT ILLNESS: The patient is back today for followup. He is status post percutaneous endovascular repair of a 5.6 cm infrarenal abdominal aortic aneurysm on 04/30/2012. A Gore Excluder device was deployed. 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 HTN (hypertension)   . Obesity   . History of cholecystectomy   . CAD (coronary artery disease)     Status post CABG followed by redo in 1994 / nuclear 2007, old infarct, no ischemia  . Ejection fraction < 50%     EF  45%...echo 2004 /  EF 45%, echo, August, 2012  . Hyperlipidemia   . Cigarette smoker     He is now smoking cigars and needs to quit.  . 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 no replacement until leg is stable  . Prostate cancer     History of prostate cancer with a seed implant and this is stable.  . Abdominal aortic aneurysm     is stable, being followed  . Atrial fibrillation     Rate control / going in and out of atrial fib, Coumadin  . Volume overload   . Right bundle branch block     noted November 09, 2008.  . Warfarin anticoagulation     Atrial fibrillation  . Carotid artery disease     Doppler September 2 765, 46-50% LICA / Doppler October, 2010, 0-39% R. ICA 35-46% LICA  . Diabetes mellitus   . Myocardial infarction 1984  . Ulcer   . Peripheral vascular disease   . 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 Heart murmur   . Arthritis   . Tremor     ? caused via by antibiotic currently taking  . Bradycardia     Past Surgical History  Procedure Laterality Date  . Total knee arthroplasty    . Coronary artery bypass graft  1994    x2  . Anal fissure repair     . Pr vein bypass graft,aorto-fem-pop    . Leg amputation above knee  Oct. 23, 2012    Left Leg AKA  . Joint replacement      bil knee  . Abdominal aortic endovascular stent graft N/A 04/30/2012    Procedure:  Ultrasound guided,   inserstion of  ABDOMINAL AORTIC ENDOVASCULAR STENT GRAFT;  Surgeon: [name], MD;  Location: [facility];  Service: Vascular;  Laterality: N/A;  EVAR- Gore  . I&d knee with poly exchange Right 07/13/2012    Procedure: IRRIGATION AND DEBRIDEMENT KNEE WITH POLY EXCHANGE;  Surgeon: [name], MD;  Location: [facility];  Service: Orthopedics;  Laterality: Right;    History   Social History  . Marital Status: Married    Spouse Name: N/A    Number of Children: N/A  . Years of Education: N/A   Occupational History  . Not on file.   Social History Main Topics  . Smoking status: Current Every Day Smoker -- 0.50 packs/day for 30 years    Types: Cigarettes, Cigars    Start date: 07/07/2012  . Smokeless tobacco:  Never Used     Comment: using E cig too  . Alcohol Use: No  . Drug Use: No  . Sexual Activity: Not on file   Other Topics Concern  . Not on file   Social History Narrative    Family History  Problem Relation Age of Onset  . Stroke Father 3  . Hypertension Father   . Diabetes Other   . Diabetes Mother   . Cancer Sister   . Diabetes Sister   . Cancer Brother   . Diabetes Brother   . Heart disease Brother     Allergies as of 01/24/2014 - Review Complete 01/24/2014  Allergen Reaction Noted  . Cefepime  09/23/2012  . Amoxicillin    . Morphine Nausea And Vomiting     Current Outpatient Prescriptions on File Prior to Visit  Medication Sig Dispense Refill  . doxycycline (VIBRA-TABS) 100 MG tablet TAKE 1 TABLET TWICE A DAY 180 tablet 2  . furosemide (LASIX) 40 MG tablet Take 40 mg by mouth daily. Take one (1) tablet by mouth daily.    Marland Kitchen gabapentin (NEURONTIN) 100 MG capsule Take 100 mg by mouth 3 (three) times daily as needed. For phantom  pain    . magnesium gluconate (MAGONATE) 500 MG tablet Take 500 mg by mouth daily.     . metFORMIN (GLUCOPHAGE) 850 MG tablet Take 850 mg by mouth 2 (two) times daily with a meal.     . metolazone (ZAROXOLYN) 2.5 MG tablet daily.     . potassium chloride SA (K-DUR,KLOR-CON) 20 MEQ tablet Take 40 mEq by mouth every evening.     . simvastatin (ZOCOR) 40 MG tablet Take 1 tablet (40 mg total) by mouth at bedtime. 90 tablet 3  . spironolactone (ALDACTONE) 25 MG tablet Take 25 mg by mouth 2 (two) times daily. Take one (1) tablet by mouth two (2) times a day.    . warfarin (COUMADIN) 2.5 MG tablet Take as directed by anticoagulation clinic 90 tablet 1   No current facility-administered medications on file prior to visit.     REVIEW OF SYSTEMS: Cardiovascular: No chest pain, chest pressure, palpitations, orthopnea, or dyspnea on exertion. No claudication or rest pain,  No history of DVT or phlebitis. Pulmonary: No productive cough, asthma or wheezing. Neurologic: No weakness, paresthesias, aphasia, or amaurosis. No dizziness. Hematologic: No bleeding problems or clotting disorders. Musculoskeletal: left leg amputation. Gastrointestinal: No blood in stool or hematemesis Genitourinary: No dysuria or hematuria. Psychiatric:: No history of major depression. Integumentary: No rashes or ulcers. Constitutional: No fever or chills.  PHYSICAL EXAMINATION:   Vital signs are BP 121/57 mmHg  Pulse 54  Temp(Src) 97.8 F (36.6 C) (Oral)  Resp 16  Ht 5\' 11"  (1.803 m)  Wt 215 lb (97.523 kg)  BMI 30.00 kg/m2  SpO2 100% General: The patient appears their stated age. HEENT:  No gross abnormalities Pulmonary:  Non labored breathing Abdomen: Soft and non-tender Musculoskeletal: There are no major deformities. Neurologic: No focal weakness or paresthesias are detected, Skin: There are no ulcer or rashes noted. Psychiatric: The patient has normal affect. Cardiovascular: There is a regular rate and rhythm  without significant murmur appreciated.   Diagnostic Studies Ultrasound was ordered and reviewed today.  This shows maximum diameter of the aneurysm is 4.9 x 5.1.  Assessment: Status post endovascular aneurysm repair Plan: The patient had a type II endoleak on his initial CT scan.  Despite that, his aneurysm has gotten smaller.  I will continue  with annual ultrasound surveillance. The patient has 40-59 % carotid stenosis.  This is being followed by cardiology  V. Leia Alf, M.D. [facility] Office: [phone] Pager:  [phone]

## 2014-01-24 NOTE — Addendum Note (Signed)
Addended by: Mena Goes on: 01/24/2014 01:36 PM   Modules accepted: Orders

## 2014-02-08 ENCOUNTER — Encounter (INDEPENDENT_AMBULATORY_CARE_PROVIDER_SITE_OTHER): Payer: Medicare Other

## 2014-02-08 DIAGNOSIS — Z7901 Long term (current) use of anticoagulants: Secondary | ICD-10-CM

## 2014-02-11 ENCOUNTER — Ambulatory Visit (INDEPENDENT_AMBULATORY_CARE_PROVIDER_SITE_OTHER): Payer: Medicare Other | Admitting: *Deleted

## 2014-02-11 DIAGNOSIS — Z5181 Encounter for therapeutic drug level monitoring: Secondary | ICD-10-CM

## 2014-02-11 LAB — POCT INR: INR: 2.8

## 2014-02-17 ENCOUNTER — Ambulatory Visit: Payer: Medicare Other | Admitting: Internal Medicine

## 2014-02-22 ENCOUNTER — Ambulatory Visit: Payer: Medicare Other | Admitting: Internal Medicine

## 2014-03-24 ENCOUNTER — Ambulatory Visit (INDEPENDENT_AMBULATORY_CARE_PROVIDER_SITE_OTHER): Payer: Medicare Other

## 2014-03-24 DIAGNOSIS — Z5181 Encounter for therapeutic drug level monitoring: Secondary | ICD-10-CM

## 2014-03-24 DIAGNOSIS — I4891 Unspecified atrial fibrillation: Secondary | ICD-10-CM

## 2014-03-24 DIAGNOSIS — Z7901 Long term (current) use of anticoagulants: Secondary | ICD-10-CM

## 2014-03-24 LAB — POCT INR: INR: 2.9

## 2014-05-05 ENCOUNTER — Ambulatory Visit (INDEPENDENT_AMBULATORY_CARE_PROVIDER_SITE_OTHER): Payer: Medicare Other | Admitting: *Deleted

## 2014-05-05 DIAGNOSIS — I4891 Unspecified atrial fibrillation: Secondary | ICD-10-CM

## 2014-05-05 DIAGNOSIS — Z5181 Encounter for therapeutic drug level monitoring: Secondary | ICD-10-CM

## 2014-05-05 DIAGNOSIS — Z7901 Long term (current) use of anticoagulants: Secondary | ICD-10-CM

## 2014-05-05 LAB — POCT INR: INR: 2.8

## 2014-05-05 MED ORDER — WARFARIN SODIUM 2.5 MG PO TABS: ORAL_TABLET | ORAL | Status: DC

## 2014-05-13 ENCOUNTER — Ambulatory Visit (INDEPENDENT_AMBULATORY_CARE_PROVIDER_SITE_OTHER): Payer: Medicare Other | Admitting: Podiatry

## 2014-05-13 DIAGNOSIS — E1151 Type 2 diabetes mellitus with diabetic peripheral angiopathy without gangrene: Secondary | ICD-10-CM

## 2014-05-13 DIAGNOSIS — B351 Tinea unguium: Secondary | ICD-10-CM | POA: Diagnosis not present

## 2014-05-13 DIAGNOSIS — E1142 Type 2 diabetes mellitus with diabetic polyneuropathy: Secondary | ICD-10-CM | POA: Diagnosis not present

## 2014-05-15 NOTE — Progress Notes (Signed)
Patient ID: Derek Frye, male   DOB: Nov 30, 1940, 74 y.o.   MRN: [id number]  Subjective: 74 y.o.-year-old amle returns the office today for elongated, thickened toenails on the right foot which he is unable to do himself. He does state the nails rub in his shoes. Denies any redness or drainage around the nails. Denies any acute changes since last appointment and no new complaints today. Denies any systemic complaints such as fevers, chills, nausea, vomiting.   Objective: L AKA AAO 3, NAD DP/PT pulses palpable  decreased, CRT less than 3 seconds Protective sensation decreased with Simms Weinstein monofilament.  Nails hypertrophic, dystrophic, elongated, brittle, discolored 5. There is no surrounding erythema or drainage along the nail sites. No open lesions or pre-ulcerative lesions are identified. No other areas of tenderness bilateral lower extremities. No overlying erythema, increased warmth. There is chronic appearing edema to the right lower extremity.  No pain with calf compression, warmth, erythema.  Assessment: Patient presents with symptomatic onychomycosis  Plan: -Treatment options including alternatives, risks, complications were discussed -Nails sharply debrided 5 without complication/bleeding. -Discussed daily foot inspection. If there are any changes, to call the office immediately.  -Follow-up in 3 months or sooner if any problems are to arise. In the meantime, encouraged to call the office with any questions, concerns, changes symptoms.

## 2014-05-27 ENCOUNTER — Other Ambulatory Visit (HOSPITAL_COMMUNITY): Payer: Self-pay | Admitting: *Deleted

## 2014-05-27 DIAGNOSIS — I6523 Occlusion and stenosis of bilateral carotid arteries: Secondary | ICD-10-CM

## 2014-06-21 ENCOUNTER — Ambulatory Visit (INDEPENDENT_AMBULATORY_CARE_PROVIDER_SITE_OTHER): Payer: Medicare Other

## 2014-06-21 DIAGNOSIS — Z5181 Encounter for therapeutic drug level monitoring: Secondary | ICD-10-CM | POA: Diagnosis not present

## 2014-06-21 DIAGNOSIS — I4891 Unspecified atrial fibrillation: Secondary | ICD-10-CM

## 2014-06-21 DIAGNOSIS — Z7901 Long term (current) use of anticoagulants: Secondary | ICD-10-CM

## 2014-06-21 LAB — POCT INR: INR: 3.9

## 2014-07-05 ENCOUNTER — Ambulatory Visit (HOSPITAL_COMMUNITY): Payer: Medicare Other | Attending: Cardiology

## 2014-07-05 DIAGNOSIS — E785 Hyperlipidemia, unspecified: Secondary | ICD-10-CM | POA: Insufficient documentation

## 2014-07-05 DIAGNOSIS — Z951 Presence of aortocoronary bypass graft: Secondary | ICD-10-CM | POA: Insufficient documentation

## 2014-07-05 DIAGNOSIS — I739 Peripheral vascular disease, unspecified: Secondary | ICD-10-CM | POA: Insufficient documentation

## 2014-07-05 DIAGNOSIS — E119 Type 2 diabetes mellitus without complications: Secondary | ICD-10-CM | POA: Insufficient documentation

## 2014-07-05 DIAGNOSIS — I6523 Occlusion and stenosis of bilateral carotid arteries: Secondary | ICD-10-CM | POA: Diagnosis not present

## 2014-07-05 DIAGNOSIS — I2510 Atherosclerotic heart disease of native coronary artery without angina pectoris: Secondary | ICD-10-CM | POA: Insufficient documentation

## 2014-07-08 ENCOUNTER — Ambulatory Visit (INDEPENDENT_AMBULATORY_CARE_PROVIDER_SITE_OTHER): Payer: Medicare Other | Admitting: Cardiology

## 2014-07-08 ENCOUNTER — Encounter: Payer: Self-pay | Admitting: Cardiology

## 2014-07-08 ENCOUNTER — Ambulatory Visit (INDEPENDENT_AMBULATORY_CARE_PROVIDER_SITE_OTHER): Payer: Medicare Other | Admitting: *Deleted

## 2014-07-08 VITALS — BP 102/48 | HR 57 | Ht 71.0 in | Wt 225.0 lb

## 2014-07-08 DIAGNOSIS — I779 Disorder of arteries and arterioles, unspecified: Secondary | ICD-10-CM

## 2014-07-08 DIAGNOSIS — R9430 Abnormal result of cardiovascular function study, unspecified: Secondary | ICD-10-CM

## 2014-07-08 DIAGNOSIS — R001 Bradycardia, unspecified: Secondary | ICD-10-CM

## 2014-07-08 DIAGNOSIS — I4891 Unspecified atrial fibrillation: Secondary | ICD-10-CM

## 2014-07-08 DIAGNOSIS — I6523 Occlusion and stenosis of bilateral carotid arteries: Secondary | ICD-10-CM

## 2014-07-08 DIAGNOSIS — R0989 Other specified symptoms and signs involving the circulatory and respiratory systems: Secondary | ICD-10-CM | POA: Diagnosis not present

## 2014-07-08 DIAGNOSIS — I2510 Atherosclerotic heart disease of native coronary artery without angina pectoris: Secondary | ICD-10-CM

## 2014-07-08 DIAGNOSIS — Z5181 Encounter for therapeutic drug level monitoring: Secondary | ICD-10-CM

## 2014-07-08 DIAGNOSIS — I480 Paroxysmal atrial fibrillation: Secondary | ICD-10-CM

## 2014-07-08 DIAGNOSIS — I739 Peripheral vascular disease, unspecified: Secondary | ICD-10-CM

## 2014-07-08 DIAGNOSIS — T8450XS Infection and inflammatory reaction due to unspecified internal joint prosthesis, sequela: Secondary | ICD-10-CM

## 2014-07-08 DIAGNOSIS — Z7901 Long term (current) use of anticoagulants: Secondary | ICD-10-CM

## 2014-07-08 LAB — POCT INR: INR: 2.5

## 2014-07-08 MED ORDER — WARFARIN SODIUM 2.5 MG PO TABS: ORAL_TABLET | ORAL | Status: DC

## 2014-07-08 NOTE — Assessment & Plan Note (Signed)
He is not having any significant chest pain. Nuclear in February, 2014 revealed an old large scar. The study was not gated. There was no scheme you. No further workup.

## 2014-07-08 NOTE — Patient Instructions (Signed)
Medication Instructions:  None  Labwork: None  Testing/Procedures: None  Follow-Up: Your physician wants you to follow-up in: 6 months. You will receive a reminder letter in the mail two months in advance. If you don't receive a letter, please call our office to schedule the follow-up appointment.      

## 2014-07-08 NOTE — Assessment & Plan Note (Signed)
His recent Doppler revealed moderate disease but stable. This can be followed in 2 years.

## 2014-07-08 NOTE — Progress Notes (Signed)
Cardiology Office Note   Date:  07/08/2014   ID:  DAVARIS Frye, DOB 1940/11/04, MRN [id number]  PCP:  Woody Seller, MD  Cardiologist:  Dola Argyle, MD   Chief Complaint  Patient presents with  . Appointment    Follow-up coronary artery disease      History of Present Illness: Derek Frye is a 74 y.o. male who presents today to follow up coronary disease. I have followed him for more than 25 years. He is here in the wheelchair. 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 This is followed by infectious disease and he is on chronic doxycycline. He continues to smoke cigars. Every time I see him he says that he will stop. His carotid disease is stable by recent Doppler. He had abdominal aortic aneurysm repaired by Dr. Trula Slade in the past. He had an endoleak. This is followed carefully and it is stable. He's not having any significant chest pain. He is not having any significant shortness of breath.    Past Medical History  Diagnosis Date  . HTN (hypertension)   . Obesity   . History of cholecystectomy   . CAD (coronary artery disease)     Status post CABG followed by redo in 1994 / nuclear 2007, old infarct, no ischemia  . Ejection fraction < 50%     EF  45%...echo 2004 /  EF 45%, echo, August, 2012  . Hyperlipidemia   . Cigarette smoker     He is now smoking cigars and needs to quit.  . 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 no replacement until leg is stable  . Prostate cancer     History of prostate cancer with a seed implant and this is stable.  . Abdominal aortic aneurysm     is stable, being followed  . Atrial fibrillation     Rate control / going in and out of atrial fib, Coumadin  . Volume overload   . Right bundle branch block     noted November 09, 2008.  . Warfarin anticoagulation     Atrial fibrillation  . Carotid artery disease     Doppler September 2 256, 38-93% LICA / Doppler  October, 2010, 0-39% R. ICA 73-42% LICA  . Diabetes mellitus   . Myocardial infarction 1984  . Ulcer   . Peripheral vascular disease   . 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 Heart murmur   . Arthritis   . Tremor     ? caused via by antibiotic currently taking  . Bradycardia     Past Surgical History  Procedure Laterality Date  . Total knee arthroplasty    . Coronary artery bypass graft  1994    x2  . Anal fissure repair    . Pr vein bypass graft,aorto-fem-pop    . Leg amputation above knee  Oct. 23, 2012    Left Leg AKA  . Joint replacement      bil knee  . Abdominal aortic endovascular stent graft N/A 04/30/2012    Procedure:  Ultrasound guided,   inserstion of  ABDOMINAL AORTIC ENDOVASCULAR STENT GRAFT;  Surgeon: [name], MD;  Location: [facility];  Service: Vascular;  Laterality: N/A;  EVAR- Gore  . I&d knee with poly exchange Right 07/13/2012    Procedure: IRRIGATION AND DEBRIDEMENT KNEE WITH POLY EXCHANGE;  Surgeon: Pietro Cassis  Alvan Dame, MD;  Location: [facility];  Service: Orthopedics;  Laterality: Right;    Patient Active Problem List   Diagnosis Date Noted  . Ejection fraction < 50%     Priority: High  . H/O endovascular stent graft for abdominal aortic aneurysm 01/24/2014  . 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 Aftercare following surgery of the circulatory system, [place] 07/05/2013  . Encounter for therapeutic drug monitoring 04/09/2013  . Bradycardia   . Unspecified constipation 07/20/2012  . Type II or unspecified type diabetes mellitus with peripheral circulatory disorders, uncontrolled(250.72) 07/17/2012  . Cellulitis and abscess of lower leg 07/07/2012  . 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 Gram-negative infection 11/19/2010  . HTN (hypertension)   . Obesity   . CAD (coronary artery disease)   . Hyperlipidemia   . Cigarette smoker   . Atrial fibrillation   . Volume overload   . Right bundle branch block    . Warfarin anticoagulation   . Carotid artery disease   . Long term (current) use of anticoagulants 06/11/2010  . STREPTOCOCCUS INFECTION CCE & UNS SITE GROUP B 02/28/2010  . CELLULITIS AND ABSCESS OF LEG EXCEPT FOOT 12/19/2009  . DYSLIPIDEMIA 11/08/2008  . Fluid overload 11/08/2008  . AAA 11/08/2008  . TOBACCO ABUSE, HX OF 11/08/2008      Current Outpatient Prescriptions  Medication Sig Dispense Refill  . atorvastatin (LIPITOR) 40 MG tablet Take 40 mg by mouth daily.    Marland Kitchen doxycycline (VIBRA-TABS) 100 MG tablet TAKE 1 TABLET TWICE A DAY 180 tablet 2  . furosemide (LASIX) 40 MG tablet Take 40 mg by mouth daily. Take one (1) tablet by mouth daily.    Marland Kitchen gabapentin (NEURONTIN) 100 MG capsule Take 100 mg by mouth 3 (three) times daily as needed. For phantom pain    . magnesium gluconate (MAGONATE) 500 MG tablet Take 500 mg by mouth daily.     . metFORMIN (GLUCOPHAGE) 850 MG tablet Take 850 mg by mouth 2 (two) times daily with a meal.     . metolazone (ZAROXOLYN) 2.5 MG tablet daily.     . potassium chloride SA (K-DUR,KLOR-CON) 20 MEQ tablet Take 40 mEq by mouth every evening.     Marland Kitchen spironolactone (ALDACTONE) 25 MG tablet Take 25 mg by mouth 2 (two) times daily. Take one (1) tablet by mouth two (2) times a day.    . warfarin (COUMADIN) 2.5 MG tablet Take as directed by anticoagulation clinic 90 tablet 1   No current facility-administered medications for this visit.    Allergies:   Cefepime; Amoxicillin; and Morphine    Social History:  The patient  reports that he has been smoking Cigarettes and Cigars.  He started smoking about 2 years ago. He has a 15 pack-year smoking history. He has never used smokeless tobacco. He reports that he does not drink alcohol or use illicit drugs.   Family History:  The patient's family history includes Cancer in his brother and sister; Diabetes in his brother, mother, other, and sister; Heart disease in his brother; Hypertension in his father; Stroke  (age of onset: 76) in his father.    ROS:  Please see the history of present illness.    Patient denies fever, chills, headache, sweats, rash, change in vision, change in hearing, chest pain, cough, nausea or vomiting, urinary symptoms. All other systems are reviewed and are negative.    PHYSICAL EXAM: VS:  BP 102/48 mmHg  Pulse 57  Ht 5\' 11"  (  1.803 m)  Wt 225 lb (102.059 kg)  BMI 31.39 kg/m2  SpO2 97% , Patient is stable in a wheelchair. He is here with his family member. He is oriented to person time and place. Affect is normal. Head is atraumatic. Sclerae and conjunctiva are normal. There is no jugulovenous distention. Lungs are clear. Respiratory effort is nonlabored. Cardiac exam reveals S1 and S2. The abdomen is soft. He is overweight. He has left AKA. He has significant swelling of his right leg. This varies significantly during the day. He has not yet taken his daily diuretic. He will take it when he gets home and gets his leg elevated and the edema will decrease by his report. There are chronic skin changes of the right lower extremity.  EKG:   EKG is not done today.   Recent Labs: 08/10/2013: BUN 31*; Creatinine 1.05; Hemoglobin 11.0*; Platelets 202; Potassium 3.8; Sodium 137    Lipid Panel No results found for: CHOL, TRIG, HDL, CHOLHDL, VLDL, LDLCALC, LDLDIRECT    Wt Readings from Last 3 Encounters:  07/08/14 225 lb (102.059 kg)  01/24/14 215 lb (97.523 kg)  12/08/13 216 lb (97.977 kg)      Current medicines are reviewed  The patient understands his medications.     ASSESSMENT AND PLAN:

## 2014-07-08 NOTE — Progress Notes (Signed)
Carotid duplex performed 

## 2014-07-08 NOTE — Assessment & Plan Note (Signed)
The patient's ejection fraction is in the range of 45%. His blood pressure is low and his pulse is low. I have not pushed medications for his left ventricular function. He has been quite stable on his current medications.

## 2014-07-08 NOTE — Assessment & Plan Note (Signed)
He has had slow rates with his atrial fib at times. Therefore he is not on significant beta-blockade.

## 2014-07-08 NOTE — Assessment & Plan Note (Signed)
There is a history of the patient going in and out of atrial fibrillation. We control the rate. He is anticoagulated.

## 2014-07-08 NOTE — Assessment & Plan Note (Signed)
Patient has chronic infection of his joint of the right leg. He is on chronic suppression with doxycycline. He is followed carefully by infectious disease.

## 2014-08-02 ENCOUNTER — Ambulatory Visit (INDEPENDENT_AMBULATORY_CARE_PROVIDER_SITE_OTHER): Payer: Medicare Other | Admitting: *Deleted

## 2014-08-02 DIAGNOSIS — Z7901 Long term (current) use of anticoagulants: Secondary | ICD-10-CM

## 2014-08-02 DIAGNOSIS — Z5181 Encounter for therapeutic drug level monitoring: Secondary | ICD-10-CM

## 2014-08-02 DIAGNOSIS — I480 Paroxysmal atrial fibrillation: Secondary | ICD-10-CM | POA: Diagnosis not present

## 2014-08-02 LAB — POCT INR: INR: 2.7

## 2014-08-05 ENCOUNTER — Other Ambulatory Visit: Payer: Self-pay | Admitting: Cardiology

## 2014-08-18 ENCOUNTER — Ambulatory Visit (INDEPENDENT_AMBULATORY_CARE_PROVIDER_SITE_OTHER): Payer: Medicare Other | Admitting: Podiatry

## 2014-08-18 ENCOUNTER — Encounter: Payer: Self-pay | Admitting: Podiatry

## 2014-08-18 DIAGNOSIS — M79671 Pain in right foot: Secondary | ICD-10-CM

## 2014-08-18 DIAGNOSIS — E1151 Type 2 diabetes mellitus with diabetic peripheral angiopathy without gangrene: Secondary | ICD-10-CM

## 2014-08-18 DIAGNOSIS — B351 Tinea unguium: Secondary | ICD-10-CM | POA: Diagnosis not present

## 2014-08-18 NOTE — Progress Notes (Signed)
Patient ID: Derek Frye, male   DOB: 10-24-1940, 74 y.o.   MRN: [id number] HPI  Complaint:  Visit Type: Patient returns to my office for continued preventative foot care services. Complaint: Patient states" my nails have grown long and thick and become painful to walk and wear shoes" in his right foot. Patient has been diagnosed with DM with pvd and neuropathy.Marland Kitchen He presents for preventative foot care services. No changes to ROS.  He has previous history of left leg amputation due to diabetic complications.  Podiatric Exam: Vascular: dorsalis pedis and posterior tibial pulses are negative right foot.. Capillary return is diminished. Temperature gradient is negative. Skin turgor, bilateral swelling  Sensorium: Absent l Semmes Weinstein monofilament test. .  Nail Exam: Pt has thick disfigured discolored nails with subungual debris noted l entire nail hallux through fifth toenails Ulcer Exam: There is no evidence of ulcer or pre-ulcerative changes or infection.  Skin: No Porokeratosis. No infection or ulcers  Diagnosis:  Tinea unguium, Pain in right toe,  EDiabetes with vascular complications  Treatment & Plan Procedures and Treatment: Consent by patient was obtained for treatment procedures. The patient understood the discussion of treatment and procedures well. All questions were answered thoroughly reviewed. Debridement of mycotic and hypertrophic toenails, 1 through 5 right and clearing of subungual debris. No ulceration, no infection noted. Noted bleeding subungually third toe right foot.  Povidine/dsd Return Visit-Office Procedure: Patient instructed to return to the office for a follow up visit 3 months for continued evaluation and treatment.

## 2014-08-30 ENCOUNTER — Ambulatory Visit (INDEPENDENT_AMBULATORY_CARE_PROVIDER_SITE_OTHER): Payer: Medicare Other | Admitting: *Deleted

## 2014-08-30 DIAGNOSIS — Z5181 Encounter for therapeutic drug level monitoring: Secondary | ICD-10-CM

## 2014-08-30 DIAGNOSIS — Z7901 Long term (current) use of anticoagulants: Secondary | ICD-10-CM

## 2014-08-30 DIAGNOSIS — I480 Paroxysmal atrial fibrillation: Secondary | ICD-10-CM

## 2014-08-30 LAB — POCT INR: INR: 4.4

## 2014-09-05 ENCOUNTER — Other Ambulatory Visit: Payer: Self-pay

## 2014-09-14 ENCOUNTER — Ambulatory Visit (INDEPENDENT_AMBULATORY_CARE_PROVIDER_SITE_OTHER): Payer: Medicare Other | Admitting: Pharmacist

## 2014-09-14 DIAGNOSIS — Z7901 Long term (current) use of anticoagulants: Secondary | ICD-10-CM | POA: Diagnosis not present

## 2014-09-14 DIAGNOSIS — Z5181 Encounter for therapeutic drug level monitoring: Secondary | ICD-10-CM

## 2014-09-14 LAB — POCT INR: INR: 4.2

## 2014-09-27 ENCOUNTER — Encounter: Payer: Self-pay | Admitting: Internal Medicine

## 2014-09-27 ENCOUNTER — Ambulatory Visit (INDEPENDENT_AMBULATORY_CARE_PROVIDER_SITE_OTHER): Payer: Medicare Other | Admitting: Internal Medicine

## 2014-09-27 ENCOUNTER — Ambulatory Visit (INDEPENDENT_AMBULATORY_CARE_PROVIDER_SITE_OTHER): Payer: Medicare Other | Admitting: *Deleted

## 2014-09-27 VITALS — BP 110/59 | HR 55 | Temp 98.2°F

## 2014-09-27 DIAGNOSIS — Z5181 Encounter for therapeutic drug level monitoring: Secondary | ICD-10-CM

## 2014-09-27 DIAGNOSIS — M869 Osteomyelitis, unspecified: Secondary | ICD-10-CM | POA: Diagnosis not present

## 2014-09-27 DIAGNOSIS — I6523 Occlusion and stenosis of bilateral carotid arteries: Secondary | ICD-10-CM | POA: Diagnosis not present

## 2014-09-27 DIAGNOSIS — M7989 Other specified soft tissue disorders: Secondary | ICD-10-CM | POA: Diagnosis not present

## 2014-09-27 DIAGNOSIS — Z7901 Long term (current) use of anticoagulants: Secondary | ICD-10-CM

## 2014-09-27 DIAGNOSIS — Z96659 Presence of unspecified artificial knee joint: Secondary | ICD-10-CM

## 2014-09-27 DIAGNOSIS — I4891 Unspecified atrial fibrillation: Secondary | ICD-10-CM

## 2014-09-27 DIAGNOSIS — T8459XS Infection and inflammatory reaction due to other internal joint prosthesis, sequela: Secondary | ICD-10-CM

## 2014-09-27 DIAGNOSIS — T8450XS Infection and inflammatory reaction due to unspecified internal joint prosthesis, sequela: Secondary | ICD-10-CM

## 2014-09-27 LAB — BASIC METABOLIC PANEL
BUN: 31 mg/dL — ABNORMAL HIGH (ref 6–23)
CHLORIDE: 99 meq/L (ref 96–112)
CO2: 25 meq/L (ref 19–32)
Calcium: 9 mg/dL (ref 8.4–10.5)
Creat: 1.26 mg/dL (ref 0.50–1.35)
GLUCOSE: 134 mg/dL — AB (ref 70–99)
POTASSIUM: 4.3 meq/L (ref 3.5–5.3)
Sodium: 139 mEq/L (ref 135–145)

## 2014-09-27 LAB — C-REACTIVE PROTEIN: CRP: 1.1 mg/dL — AB (ref <0.60)

## 2014-09-27 LAB — POCT INR: INR: 3.8

## 2014-09-27 MED ORDER — DOXYCYCLINE HYCLATE 100 MG PO TABS: 100.0000 mg | ORAL_TABLET | Freq: Two times a day (BID) | ORAL | Status: DC

## 2014-09-27 NOTE — Progress Notes (Signed)
Subjective:    Patient ID: Derek Frye, male    DOB: 09-Feb-1941, 74 y.o.   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 He is here for routine follow up on chronic abtx suppression, continues on doxycycline. He states that he is doing well with taking medications, still has mild swelling to right knee. No erythema. Still has limited range of motion to right knee due to inactivity, contracture.  Allergies  Allergen Reactions  . Cefepime     ? tremors  . Amoxicillin     REACTION: swelling and a rash  . Morphine Nausea And Vomiting   Current Outpatient Prescriptions on File Prior to Visit  Medication Sig Dispense Refill  . atorvastatin (LIPITOR) 40 MG tablet Take 40 mg by mouth daily.    . furosemide (LASIX) 40 MG tablet Take 40 mg by mouth daily. Take one (1) tablet by mouth daily.    Marland Kitchen gabapentin (NEURONTIN) 100 MG capsule Take 100 mg by mouth 3 (three) times daily as needed. For phantom pain    . magnesium gluconate (MAGONATE) 500 MG tablet Take 500 mg by mouth daily.     . metFORMIN (GLUCOPHAGE) 850 MG tablet Take 850 mg by mouth 2 (two) times daily with a meal.     . metolazone (ZAROXOLYN) 2.5 MG tablet daily.     . potassium chloride SA (K-DUR,KLOR-CON) 20 MEQ tablet Take 40 mEq by mouth every evening.     Marland Kitchen spironolactone (ALDACTONE) 25 MG tablet Take 25 mg by mouth 2 (two) times daily. Take one (1) tablet by mouth two (2) times a day.    . warfarin (COUMADIN) 2.5 MG tablet TAKE AS DIRECTED BY ANTICOAGULATION CLINIC-90 TABS IS 90 DAY SUPPLY 90 tablet 1   No current facility-administered medications on file prior to visit.   Active Ambulatory Problems    Diagnosis Date Noted  . DYSLIPIDEMIA 11/08/2008  . Fluid overload 11/08/2008  . AAA 11/08/2008  . CELLULITIS AND ABSCESS OF LEG EXCEPT FOOT 12/19/2009  . TOBACCO ABUSE, HX OF 11/08/2008  . STREPTOCOCCUS INFECTION CCE & UNS SITE GROUP B  02/28/2010  . Long term (current) use of anticoagulants 06/11/2010  . HTN (hypertension)   . Obesity   . CAD (coronary artery disease)   . Hyperlipidemia   . Cigarette smoker   . Atrial fibrillation   . Volume overload   . Right bundle branch block   . Warfarin anticoagulation   . Carotid artery disease   . 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 Gram-negative infection 11/19/2010  . Ejection fraction < 50%   . Cellulitis and abscess of lower leg 07/07/2012  . Type II or unspecified type diabetes mellitus with peripheral circulatory disorders, uncontrolled(250.72) 07/17/2012  . Unspecified constipation 07/20/2012  . Bradycardia   . Encounter for therapeutic drug monitoring 04/09/2013  . Aftercare following surgery of the circulatory system, [place] 07/05/2013  . 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 H/O endovascular stent graft for abdominal aortic aneurysm 01/24/2014   Resolved Ambulatory Problems    Diagnosis Date Noted  . No Resolved Ambulatory Problems   Past Medical History  Diagnosis Date  . History of cholecystectomy   . Total knee replacement status   . Prostate cancer   . Abdominal aortic aneurysm   . Diabetes mellitus   . Myocardial infarction 1984  . Ulcer   . Peripheral vascular disease   .  Preop cardiovascular exam   . Heart murmur   . Arthritis   . Tremor      Review of Systems No fever, chills, nightsweats. occ swelling of lower extremities    Objective:   Physical Exam BP 110/59 mmHg  Pulse 55  Temp(Src) 98.2 F (36.8 C) (Oral) gen = a x o by 3 in nad Right knee = slight warm to touch, no erythema, contracted ext = pitting edema to right lower elg  Lab Results  Component Value Date   ESRSEDRATE 42* 09/27/2014   Lab Results  Component Value Date   CRP 1.1* 09/27/2014       Assessment & Plan:  Prosthetic joint infection =Continue on doxy 100mg  bid will check inflamm markers  On coumadin, getting inr q 2wk to monitor for  interaction and keep within therapeutic range  Swelling of lower extremity = continue with diuretics  rtc in 6 mo

## 2014-09-27 NOTE — Progress Notes (Deleted)
Patient ID: Derek Frye, male   DOB: Jul 30, 1940, 74 y.o.   MRN: [id number]

## 2014-09-28 ENCOUNTER — Telehealth: Payer: Self-pay | Admitting: *Deleted

## 2014-09-28 LAB — CBC WITH DIFFERENTIAL/PLATELET
Basophils Absolute: 0.1 10*3/uL (ref 0.0–0.1)
Basophils Relative: 1 % (ref 0–1)
EOS ABS: 0.1 10*3/uL (ref 0.0–0.7)
Eosinophils Relative: 2 % (ref 0–5)
HCT: 28.4 % — ABNORMAL LOW (ref 39.0–52.0)
Hemoglobin: 9 g/dL — ABNORMAL LOW (ref 13.0–17.0)
Lymphocytes Relative: 22 % (ref 12–46)
Lymphs Abs: 1.3 10*3/uL (ref 0.7–4.0)
MCH: 25.6 pg — ABNORMAL LOW (ref 26.0–34.0)
MCHC: 31.7 g/dL (ref 30.0–36.0)
MCV: 80.9 fL (ref 78.0–100.0)
MONO ABS: 0.7 10*3/uL (ref 0.1–1.0)
MPV: 9.3 fL (ref 8.6–12.4)
Monocytes Relative: 12 % (ref 3–12)
NEUTROS ABS: 3.7 10*3/uL (ref 1.7–7.7)
Neutrophils Relative %: 63 % (ref 43–77)
Platelets: 230 10*3/uL (ref 150–400)
RBC: 3.51 MIL/uL — AB (ref 4.22–5.81)
RDW: 18.4 % — AB (ref 11.5–15.5)
WBC: 5.8 10*3/uL (ref 4.0–10.5)

## 2014-09-28 LAB — SEDIMENTATION RATE: Sed Rate: 42 mm/hr — ABNORMAL HIGH (ref 0–20)

## 2014-09-28 NOTE — Telephone Encounter (Signed)
Pharmacist notified.

## 2014-09-28 NOTE — Telephone Encounter (Signed)
Pharmacy called stating there is a drug interaction between patient's coumadin and doxycycline; please advise. Derek Frye

## 2014-09-28 NOTE — Telephone Encounter (Signed)
He has been on doxycycline and coumadin for the past year so his warfarin/inr has been adjusted to take this into consideration. Continue with doxy 100mg  BID

## 2014-10-11 ENCOUNTER — Ambulatory Visit (INDEPENDENT_AMBULATORY_CARE_PROVIDER_SITE_OTHER): Payer: Medicare Other | Admitting: *Deleted

## 2014-10-11 DIAGNOSIS — Z5181 Encounter for therapeutic drug level monitoring: Secondary | ICD-10-CM | POA: Diagnosis not present

## 2014-10-11 DIAGNOSIS — I4891 Unspecified atrial fibrillation: Secondary | ICD-10-CM

## 2014-10-11 DIAGNOSIS — Z7901 Long term (current) use of anticoagulants: Secondary | ICD-10-CM

## 2014-10-11 LAB — POCT INR: INR: 2.7

## 2014-10-21 ENCOUNTER — Other Ambulatory Visit: Payer: Self-pay | Admitting: *Deleted

## 2014-10-21 DIAGNOSIS — Z96659 Presence of unspecified artificial knee joint: Principal | ICD-10-CM

## 2014-10-21 DIAGNOSIS — T8459XS Infection and inflammatory reaction due to other internal joint prosthesis, sequela: Secondary | ICD-10-CM

## 2014-10-21 MED ORDER — DOXYCYCLINE HYCLATE 100 MG PO TABS: 100.0000 mg | ORAL_TABLET | Freq: Two times a day (BID) | ORAL | Status: DC

## 2014-11-01 ENCOUNTER — Ambulatory Visit (INDEPENDENT_AMBULATORY_CARE_PROVIDER_SITE_OTHER): Payer: Medicare Other

## 2014-11-01 DIAGNOSIS — Z5181 Encounter for therapeutic drug level monitoring: Secondary | ICD-10-CM

## 2014-11-01 DIAGNOSIS — I4891 Unspecified atrial fibrillation: Secondary | ICD-10-CM

## 2014-11-01 DIAGNOSIS — Z7901 Long term (current) use of anticoagulants: Secondary | ICD-10-CM

## 2014-11-01 LAB — POCT INR: INR: 2.6

## 2014-11-29 ENCOUNTER — Ambulatory Visit (INDEPENDENT_AMBULATORY_CARE_PROVIDER_SITE_OTHER): Payer: Medicare Other | Admitting: *Deleted

## 2014-11-29 DIAGNOSIS — I4891 Unspecified atrial fibrillation: Secondary | ICD-10-CM | POA: Diagnosis not present

## 2014-11-29 DIAGNOSIS — Z7901 Long term (current) use of anticoagulants: Secondary | ICD-10-CM | POA: Diagnosis not present

## 2014-11-29 DIAGNOSIS — Z5181 Encounter for therapeutic drug level monitoring: Secondary | ICD-10-CM | POA: Diagnosis not present

## 2014-11-29 LAB — POCT INR: INR: 2.8

## 2014-12-01 ENCOUNTER — Ambulatory Visit (INDEPENDENT_AMBULATORY_CARE_PROVIDER_SITE_OTHER): Payer: Medicare Other | Admitting: Podiatry

## 2014-12-01 ENCOUNTER — Encounter: Payer: Self-pay | Admitting: Podiatry

## 2014-12-01 DIAGNOSIS — M79676 Pain in unspecified toe(s): Secondary | ICD-10-CM

## 2014-12-01 DIAGNOSIS — B351 Tinea unguium: Secondary | ICD-10-CM

## 2014-12-01 DIAGNOSIS — M79674 Pain in right toe(s): Secondary | ICD-10-CM

## 2014-12-01 NOTE — Progress Notes (Signed)
Patient ID: Derek Frye, male   DOB: 12/15/1940, 73 y.o.   MRN: [id number] HPI  Complaint:  Visit Type: Patient returns to my office for continued preventative foot care services. Complaint: Patient states" my nails have grown long and thick and become painful to walk and wear shoes" in his right foot. Patient has been diagnosed with DM with pvd and neuropathy.. He presents for preventative foot care services. No changes to ROS.  He has previous history of left leg amputation due to diabetic complications.  Podiatric Exam: Vascular: dorsalis pedis and posterior tibial pulses are negative right foot.. Capillary return is diminished. Temperature gradient is negative. Skin turgor, bilateral swelling  Sensorium: Absent l Semmes Weinstein monofilament test. .  Nail Exam: Pt has thick disfigured discolored nails with subungual debris noted l entire nail hallux through fifth toenails Ulcer Exam: There is no evidence of ulcer or pre-ulcerative changes or infection.  Skin: No Porokeratosis. No infection or ulcers  Diagnosis:  Tinea unguium, Pain in right toe,  EDiabetes with vascular complications  Treatment & Plan Procedures and Treatment: Consent by patient was obtained for treatment procedures. The patient understood the discussion of treatment and procedures well. All questions were answered thoroughly reviewed. Debridement of mycotic and hypertrophic toenails, 1 through 5 right and clearing of subungual debris. No ulceration, no infection noted. Noted bleeding subungually third toe right foot.  Povidine/dsd Return Visit-Office Procedure: Patient instructed to return to the office for a follow up visit 3 months for continued evaluation and treatment.  

## 2014-12-27 ENCOUNTER — Ambulatory Visit (INDEPENDENT_AMBULATORY_CARE_PROVIDER_SITE_OTHER): Payer: Medicare Other

## 2014-12-27 DIAGNOSIS — Z7901 Long term (current) use of anticoagulants: Secondary | ICD-10-CM

## 2014-12-27 DIAGNOSIS — I4891 Unspecified atrial fibrillation: Secondary | ICD-10-CM

## 2014-12-27 DIAGNOSIS — Z5181 Encounter for therapeutic drug level monitoring: Secondary | ICD-10-CM | POA: Diagnosis not present

## 2014-12-27 LAB — POCT INR: INR: 2.7

## 2015-01-05 ENCOUNTER — Encounter: Payer: Self-pay | Admitting: Cardiology

## 2015-01-05 ENCOUNTER — Ambulatory Visit (INDEPENDENT_AMBULATORY_CARE_PROVIDER_SITE_OTHER): Payer: Medicare Other | Admitting: Cardiology

## 2015-01-05 VITALS — BP 140/72 | HR 69 | Ht 71.0 in | Wt 220.0 lb

## 2015-01-05 DIAGNOSIS — I2583 Coronary atherosclerosis due to lipid rich plaque: Secondary | ICD-10-CM

## 2015-01-05 DIAGNOSIS — I10 Essential (primary) hypertension: Secondary | ICD-10-CM

## 2015-01-05 DIAGNOSIS — I4510 Unspecified right bundle-branch block: Secondary | ICD-10-CM | POA: Insufficient documentation

## 2015-01-05 DIAGNOSIS — I6523 Occlusion and stenosis of bilateral carotid arteries: Secondary | ICD-10-CM | POA: Diagnosis not present

## 2015-01-05 DIAGNOSIS — I480 Paroxysmal atrial fibrillation: Secondary | ICD-10-CM | POA: Diagnosis not present

## 2015-01-05 DIAGNOSIS — I2510 Atherosclerotic heart disease of native coronary artery without angina pectoris: Secondary | ICD-10-CM

## 2015-01-05 DIAGNOSIS — Z720 Tobacco use: Secondary | ICD-10-CM

## 2015-01-05 DIAGNOSIS — Z89612 Acquired absence of left leg above knee: Secondary | ICD-10-CM | POA: Insufficient documentation
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
## 2015-01-05 NOTE — Patient Instructions (Signed)

## 2015-01-30 ENCOUNTER — Other Ambulatory Visit (HOSPITAL_COMMUNITY): Payer: Medicare Other

## 2015-01-30 ENCOUNTER — Ambulatory Visit: Payer: Medicare Other | Admitting: Family

## 2015-02-10 ENCOUNTER — Ambulatory Visit (INDEPENDENT_AMBULATORY_CARE_PROVIDER_SITE_OTHER): Payer: Medicare Other | Admitting: *Deleted

## 2015-02-10 ENCOUNTER — Encounter: Payer: Self-pay | Admitting: Family

## 2015-02-10 DIAGNOSIS — I480 Paroxysmal atrial fibrillation: Secondary | ICD-10-CM

## 2015-02-10 DIAGNOSIS — Z5181 Encounter for therapeutic drug level monitoring: Secondary | ICD-10-CM | POA: Diagnosis not present

## 2015-02-10 DIAGNOSIS — Z7901 Long term (current) use of anticoagulants: Secondary | ICD-10-CM | POA: Diagnosis not present

## 2015-02-10 LAB — POCT INR: INR: 2.4

## 2015-02-12 ENCOUNTER — Other Ambulatory Visit: Payer: Self-pay | Admitting: Internal Medicine

## 2015-02-14 ENCOUNTER — Ambulatory Visit: Payer: Medicare Other | Admitting: Family

## 2015-02-14 ENCOUNTER — Other Ambulatory Visit (HOSPITAL_COMMUNITY): Payer: Medicare Other

## 2015-02-15 ENCOUNTER — Ambulatory Visit (INDEPENDENT_AMBULATORY_CARE_PROVIDER_SITE_OTHER): Payer: Medicare Other | Admitting: Podiatry

## 2015-02-15 ENCOUNTER — Encounter: Payer: Self-pay | Admitting: Podiatry

## 2015-02-15 DIAGNOSIS — M79676 Pain in unspecified toe(s): Secondary | ICD-10-CM | POA: Diagnosis not present

## 2015-02-15 DIAGNOSIS — E1151 Type 2 diabetes mellitus with diabetic peripheral angiopathy without gangrene: Secondary | ICD-10-CM

## 2015-02-15 DIAGNOSIS — B351 Tinea unguium: Secondary | ICD-10-CM

## 2015-02-15 DIAGNOSIS — M79674 Pain in right toe(s): Secondary | ICD-10-CM

## 2015-02-15 NOTE — Progress Notes (Signed)
Patient ID: Derek Frye, male   DOB: [birth year], 74 y.o.   MRN: [id number] HPI  Complaint:  Visit Type: Patient returns to my office for continued preventative foot care services. Complaint: Patient states" my nails have grown long and thick and become painful to walk and wear shoes" in his right foot. Patient has been diagnosed with DM with pvd and neuropathy.Marland Kitchen He presents for preventative foot care services. No changes to ROS.  He has previous history of left leg amputation due to diabetic complications.  Podiatric Exam: Vascular: dorsalis pedis and posterior tibial pulses are negative right foot.. Capillary return is diminished. Temperature gradient is negative. Skin turgor, bilateral swelling  Sensorium: Absent l Semmes Weinstein monofilament test. .  Nail Exam: Pt has thick disfigured discolored nails with subungual debris noted l entire nail hallux through fifth toenails Ulcer Exam: There is no evidence of ulcer or pre-ulcerative changes or infection.  Skin: No Porokeratosis. No infection or ulcers.  Severely swollen right foot with thick skin.  Diagnosis:  Tinea unguium, Pain in right toe,  Diabetes with vascular complications  Treatment & Plan Procedures and Treatment: Consent by patient was obtained for treatment procedures. The patient understood the discussion of treatment and procedures well. All questions were answered thoroughly reviewed. Debridement of mycotic and hypertrophic toenails, 1 through 5 right and clearing of subungual debris. No ulceration, no infection noted. Noted bleeding subungually third toe right foot.  Povidine/dsd Return Visit-Office Procedure: Patient instructed to return to the office for a follow up visit 3 months for continued evaluation and treatment  Gardiner Barefoot DPM.

## 2015-02-16 ENCOUNTER — Ambulatory Visit: Payer: Medicare Other | Admitting: Podiatry

## 2015-02-28 ENCOUNTER — Encounter: Payer: Self-pay | Admitting: Internal Medicine

## 2015-02-28 ENCOUNTER — Ambulatory Visit (INDEPENDENT_AMBULATORY_CARE_PROVIDER_SITE_OTHER): Payer: Medicare Other | Admitting: Internal Medicine

## 2015-02-28 VITALS — BP 121/77 | HR 53 | Temp 97.7°F

## 2015-02-28 DIAGNOSIS — D638 Anemia in other chronic diseases classified elsewhere: Secondary | ICD-10-CM | POA: Diagnosis not present

## 2015-02-28 DIAGNOSIS — R630 Anorexia: Secondary | ICD-10-CM | POA: Diagnosis not present

## 2015-02-28 DIAGNOSIS — I6523 Occlusion and stenosis of bilateral carotid arteries: Secondary | ICD-10-CM | POA: Diagnosis not present

## 2015-02-28 DIAGNOSIS — T8450XS Infection and inflammatory reaction due to unspecified internal joint prosthesis, sequela: Secondary | ICD-10-CM

## 2015-02-28 LAB — BASIC METABOLIC PANEL
BUN: 21 mg/dL (ref 7–25)
CALCIUM: 8.5 mg/dL — AB (ref 8.6–10.3)
CHLORIDE: 99 mmol/L (ref 98–110)
CO2: 28 mmol/L (ref 20–31)
CREATININE: 0.91 mg/dL (ref 0.70–1.18)
Glucose, Bld: 137 mg/dL — ABNORMAL HIGH (ref 65–99)
Potassium: 4.1 mmol/L (ref 3.5–5.3)
Sodium: 137 mmol/L (ref 135–146)

## 2015-02-28 LAB — CBC WITH DIFFERENTIAL/PLATELET
BASOS ABS: 0.1 10*3/uL (ref 0.0–0.1)
BASOS PCT: 1 % (ref 0–1)
EOS ABS: 0.1 10*3/uL (ref 0.0–0.7)
EOS PCT: 2 % (ref 0–5)
HCT: 30.1 % — ABNORMAL LOW (ref 39.0–52.0)
Hemoglobin: 9.2 g/dL — ABNORMAL LOW (ref 13.0–17.0)
LYMPHS ABS: 1 10*3/uL (ref 0.7–4.0)
Lymphocytes Relative: 17 % (ref 12–46)
MCH: 22.9 pg — AB (ref 26.0–34.0)
MCHC: 30.6 g/dL (ref 30.0–36.0)
MCV: 75.1 fL — AB (ref 78.0–100.0)
MPV: 9.1 fL (ref 8.6–12.4)
Monocytes Absolute: 0.9 10*3/uL (ref 0.1–1.0)
Monocytes Relative: 16 % — ABNORMAL HIGH (ref 3–12)
NEUTROS PCT: 64 % (ref 43–77)
Neutro Abs: 3.8 10*3/uL (ref 1.7–7.7)
PLATELETS: 220 10*3/uL (ref 150–400)
RBC: 4.01 MIL/uL — ABNORMAL LOW (ref 4.22–5.81)
RDW: 18.3 % — AB (ref 11.5–15.5)
WBC: 5.9 10*3/uL (ref 4.0–10.5)

## 2015-02-28 LAB — C-REACTIVE PROTEIN: CRP: 2.8 mg/dL — AB (ref <0.60)

## 2015-02-28 NOTE — Progress Notes (Signed)
Subjective:    Patient ID: Derek Frye, male    DOB: 1940/09/08, 74 y.o.   MRN: [id number]  HPI [place] is a 74yo M with DM2, HTN, HLD, obesity, hx of left aka. Hx of right prosthetic joint infection with PsA. He has been on chronic suppression with doxycycline. Last seen since this past summer. Overall, he is doing ok. Has been treated for anemia by his PCP. Right knee not causing any problems. He does have some loss of appetite, and skipped a few days of taking diuretics for which he has increasing swelling to his right leg.  Allergies  Allergen Reactions  . Cefepime     ? tremors  . Amoxicillin     REACTION: swelling and a rash  . Morphine Nausea And Vomiting   Current Outpatient Prescriptions on File Prior to Visit  Medication Sig Dispense Refill  . atorvastatin (LIPITOR) 40 MG tablet Take 40 mg by mouth daily.    Marland Kitchen doxycycline (VIBRA-TABS) 100 MG tablet TAKE 1 TABLET TWICE A DAY 180 tablet 0  . furosemide (LASIX) 40 MG tablet Take 40 mg by mouth daily. Take one (1) tablet by mouth daily.    Marland Kitchen gabapentin (NEURONTIN) 100 MG capsule Take 100 mg by mouth 3 (three) times daily as needed. For phantom pain    . magnesium gluconate (MAGONATE) 500 MG tablet Take 500 mg by mouth daily.     . metFORMIN (GLUCOPHAGE) 850 MG tablet Take 850 mg by mouth 2 (two) times daily with a meal.     . metolazone (ZAROXOLYN) 2.5 MG tablet daily.     . potassium chloride SA (K-DUR,KLOR-CON) 20 MEQ tablet Take 40 mEq by mouth every evening.     Marland Kitchen spironolactone (ALDACTONE) 25 MG tablet Take 25 mg by mouth 2 (two) times daily. Take one (1) tablet by mouth two (2) times a day.    . traMADol (ULTRAM) 50 MG tablet     . warfarin (COUMADIN) 2.5 MG tablet TAKE AS DIRECTED BY ANTICOAGULATION CLINIC-90 TABS IS 90 DAY SUPPLY 90 tablet 1   No current facility-administered medications on file prior to visit.   Active Ambulatory Problems    Diagnosis Date Noted  . DYSLIPIDEMIA 11/08/2008  . Fluid overload  11/08/2008  . AAA 11/08/2008  . CELLULITIS AND ABSCESS OF LEG EXCEPT FOOT 12/19/2009  . TOBACCO ABUSE, HX OF 11/08/2008  . STREPTOCOCCUS INFECTION CCE & UNS SITE GROUP B 02/28/2010  . Long term (current) use of anticoagulants 06/11/2010  . HTN (hypertension)   . Obesity   . CAD (coronary artery disease)   . Hyperlipidemia   . Cigarette smoker   . Atrial fibrillation ([place])   . Volume overload   . Right bundle branch block   . Warfarin anticoagulation   . Carotid artery disease ([place])   . Stage IV pressure ulcer of left heel ([place]) 11/19/2010  . Gram-negative infection 11/19/2010  . Ejection fraction < 50%   . Cellulitis and abscess of lower leg 07/07/2012  . Type II or unspecified type diabetes mellitus with peripheral circulatory disorders, uncontrolled(250.72) 07/17/2012  . Unspecified constipation 07/20/2012  . Bradycardia   . Encounter for therapeutic drug monitoring 04/09/2013  . Aftercare following surgery of the circulatory system, [place] 07/05/2013  . 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 H/O endovascular stent graft for abdominal aortic aneurysm 01/24/2014  . Status post above knee amputation of left lower extremity ([place]) 01/05/2015  . RBBB 01/05/2015  . Coronary artery disease due  to lipid rich plaque 01/05/2015  . Tobacco abuse 01/05/2015   Resolved Ambulatory Problems    Diagnosis Date Noted  . No Resolved Ambulatory Problems   Past Medical History  Diagnosis Date  . History of cholecystectomy   . Total knee replacement status   . Prostate cancer ([place])   . Abdominal aortic aneurysm ([place])   . Diabetes mellitus   . Myocardial infarction ([place]) 1984  . Ulcer   . Peripheral vascular disease ([place])   . Preop cardiovascular exam   . Heart murmur   . Arthritis   . Tremor    Social History  Substance Use Topics  . Smoking status: Current Every Day Smoker -- 0.50 packs/day for 30 years    Types: Cigarettes, Cigars    Start date: 07/07/2012  . Smokeless  tobacco: Never Used     Comment: using E cig too  . Alcohol Use: No  family history includes Cancer in his brother and sister; Diabetes in his brother, mother, other, and sister; Heart disease in his brother; Hypertension in his father; Stroke (age of onset: 35) in his father.   Review of Systems Knee conttracture (stable) right leg swelling, decrease appetite. No nausea. No vomiting. No fevers, chills, nightsweats    Objective:   Physical Exam gen = a x o by 3 in nad, chronically ill appearing. Pale complexion (noticeable since last visit) Pulm= ctab no w/c/r Cors = nl s1,s2, no g/m/r Ext = pitting edema up to just below his knee on his right leg, also some pitting edema to left stump.  Skin = hyperpigmentation, with features c/w lymphadema   Labs: Lab Results  Component Value Date   WBC 5.9 02/28/2015   HGB 9.2* 02/28/2015   HCT 30.1* 02/28/2015   MCV 75.1* 02/28/2015   PLT 220 02/28/2015    Lab Results  Component Value Date   ESRSEDRATE 5 02/28/2015   Lab Results  Component Value Date   CRP 2.8* 02/28/2015       Assessment & Plan:  - pale appearance = concern for anemia. Will check cbc. hgb appears same as in the summer though MCV is 75. Will relay info to pcp. May consider iron infusion if oral iron supplementation is intolerable.  Chronic prosthetic joint = currently on doxycycline. Will check sed rate crp and bmp. Since it as normalized we will have him finish up his current bottle. See him back in clinic in a few months and observe off of therapy. Patient understands the plan.  Poor appetite = wondering if it is the nausea associated from doxycycline

## 2015-03-01 LAB — SEDIMENTATION RATE: Sed Rate: 5 mm/hr (ref 0–20)

## 2015-03-24 ENCOUNTER — Ambulatory Visit (INDEPENDENT_AMBULATORY_CARE_PROVIDER_SITE_OTHER): Payer: Medicare Other | Admitting: *Deleted

## 2015-03-24 DIAGNOSIS — I480 Paroxysmal atrial fibrillation: Secondary | ICD-10-CM

## 2015-03-24 DIAGNOSIS — Z5181 Encounter for therapeutic drug level monitoring: Secondary | ICD-10-CM | POA: Diagnosis not present

## 2015-03-24 DIAGNOSIS — Z7901 Long term (current) use of anticoagulants: Secondary | ICD-10-CM | POA: Diagnosis not present

## 2015-03-24 LAB — POCT INR: INR: 5.2

## 2015-03-30 ENCOUNTER — Ambulatory Visit (INDEPENDENT_AMBULATORY_CARE_PROVIDER_SITE_OTHER): Payer: Medicare Other | Admitting: *Deleted

## 2015-03-30 DIAGNOSIS — Z5181 Encounter for therapeutic drug level monitoring: Secondary | ICD-10-CM

## 2015-03-30 DIAGNOSIS — I480 Paroxysmal atrial fibrillation: Secondary | ICD-10-CM

## 2015-03-30 DIAGNOSIS — Z7901 Long term (current) use of anticoagulants: Secondary | ICD-10-CM

## 2015-03-30 LAB — POCT INR: INR: 3.2

## 2015-04-13 ENCOUNTER — Ambulatory Visit (INDEPENDENT_AMBULATORY_CARE_PROVIDER_SITE_OTHER): Payer: Medicare Other | Admitting: *Deleted

## 2015-04-13 DIAGNOSIS — I480 Paroxysmal atrial fibrillation: Secondary | ICD-10-CM | POA: Diagnosis not present

## 2015-04-13 DIAGNOSIS — Z5181 Encounter for therapeutic drug level monitoring: Secondary | ICD-10-CM

## 2015-04-13 DIAGNOSIS — Z7901 Long term (current) use of anticoagulants: Secondary | ICD-10-CM

## 2015-04-13 LAB — POCT INR: INR: 2.9

## 2015-04-13 MED ORDER — WARFARIN SODIUM 2.5 MG PO TABS: ORAL_TABLET | ORAL | Status: AC

## 2015-04-14 ENCOUNTER — Encounter: Payer: Self-pay | Admitting: Family

## 2015-04-15 ENCOUNTER — Other Ambulatory Visit: Payer: Self-pay | Admitting: Cardiology

## 2015-04-19 ENCOUNTER — Other Ambulatory Visit (HOSPITAL_COMMUNITY): Payer: Medicare Other

## 2015-04-19 ENCOUNTER — Ambulatory Visit: Payer: Medicare Other | Admitting: Family

## 2015-05-09 ENCOUNTER — Encounter: Payer: Self-pay | Admitting: Family

## 2015-05-10 ENCOUNTER — Encounter (HOSPITAL_COMMUNITY): Payer: Self-pay | Admitting: *Deleted

## 2015-05-10 ENCOUNTER — Emergency Department (HOSPITAL_COMMUNITY): Payer: Medicare Other

## 2015-05-10 ENCOUNTER — Inpatient Hospital Stay (HOSPITAL_COMMUNITY)
Admission: EM | Admit: 2015-05-10 | Discharge: 2015-05-20 | DRG: 559 | Disposition: A | Discharge status: Skilled Nursing Facility | Payer: Medicare Other | Attending: Internal Medicine | Admitting: Internal Medicine

## 2015-05-10 DIAGNOSIS — T8453XA Infection and inflammatory reaction due to internal right knee prosthesis, initial encounter: Secondary | ICD-10-CM | POA: Diagnosis not present

## 2015-05-10 DIAGNOSIS — I878 Other specified disorders of veins: Secondary | ICD-10-CM | POA: Diagnosis present

## 2015-05-10 DIAGNOSIS — L02419 Cutaneous abscess of limb, unspecified: Secondary | ICD-10-CM | POA: Diagnosis present

## 2015-05-10 DIAGNOSIS — Z880 Allergy status to penicillin: Secondary | ICD-10-CM

## 2015-05-10 DIAGNOSIS — I5023 Acute on chronic systolic (congestive) heart failure: Secondary | ICD-10-CM | POA: Diagnosis present

## 2015-05-10 DIAGNOSIS — Z89612 Acquired absence of left leg above knee: Secondary | ICD-10-CM

## 2015-05-10 DIAGNOSIS — I739 Peripheral vascular disease, unspecified: Secondary | ICD-10-CM | POA: Diagnosis present

## 2015-05-10 DIAGNOSIS — E669 Obesity, unspecified: Secondary | ICD-10-CM | POA: Diagnosis present

## 2015-05-10 DIAGNOSIS — I252 Old myocardial infarction: Secondary | ICD-10-CM

## 2015-05-10 DIAGNOSIS — R609 Edema, unspecified: Secondary | ICD-10-CM

## 2015-05-10 DIAGNOSIS — I4891 Unspecified atrial fibrillation: Secondary | ICD-10-CM | POA: Diagnosis present

## 2015-05-10 DIAGNOSIS — D649 Anemia, unspecified: Secondary | ICD-10-CM | POA: Diagnosis present

## 2015-05-10 DIAGNOSIS — E119 Type 2 diabetes mellitus without complications: Secondary | ICD-10-CM | POA: Diagnosis present

## 2015-05-10 DIAGNOSIS — Z6833 Body mass index (BMI) 33.0-33.9, adult: Secondary | ICD-10-CM

## 2015-05-10 DIAGNOSIS — R17 Unspecified jaundice: Secondary | ICD-10-CM | POA: Insufficient documentation

## 2015-05-10 DIAGNOSIS — Z833 Family history of diabetes mellitus: Secondary | ICD-10-CM

## 2015-05-10 DIAGNOSIS — R791 Abnormal coagulation profile: Secondary | ICD-10-CM | POA: Diagnosis present

## 2015-05-10 DIAGNOSIS — L304 Erythema intertrigo: Secondary | ICD-10-CM | POA: Diagnosis present

## 2015-05-10 DIAGNOSIS — Z7901 Long term (current) use of anticoagulants: Secondary | ICD-10-CM

## 2015-05-10 DIAGNOSIS — L03115 Cellulitis of right lower limb: Secondary | ICD-10-CM | POA: Diagnosis present

## 2015-05-10 DIAGNOSIS — Z9049 Acquired absence of other specified parts of digestive tract: Secondary | ICD-10-CM

## 2015-05-10 DIAGNOSIS — M79604 Pain in right leg: Secondary | ICD-10-CM | POA: Diagnosis not present

## 2015-05-10 DIAGNOSIS — I10 Essential (primary) hypertension: Secondary | ICD-10-CM | POA: Diagnosis present

## 2015-05-10 DIAGNOSIS — L309 Dermatitis, unspecified: Secondary | ICD-10-CM | POA: Diagnosis present

## 2015-05-10 DIAGNOSIS — R748 Abnormal levels of other serum enzymes: Secondary | ICD-10-CM

## 2015-05-10 DIAGNOSIS — F17210 Nicotine dependence, cigarettes, uncomplicated: Secondary | ICD-10-CM | POA: Diagnosis present

## 2015-05-10 DIAGNOSIS — Y831 Surgical operation with implant of artificial internal device as the cause of abnormal reaction of the patient, or of later complication, without mention of misadventure at the time of the procedure: Secondary | ICD-10-CM | POA: Diagnosis present

## 2015-05-10 DIAGNOSIS — Z794 Long term (current) use of insulin: Secondary | ICD-10-CM

## 2015-05-10 DIAGNOSIS — L03119 Cellulitis of unspecified part of limb: Secondary | ICD-10-CM

## 2015-05-10 DIAGNOSIS — I714 Abdominal aortic aneurysm, without rupture: Secondary | ICD-10-CM | POA: Diagnosis present

## 2015-05-10 DIAGNOSIS — E785 Hyperlipidemia, unspecified: Secondary | ICD-10-CM | POA: Diagnosis present

## 2015-05-10 DIAGNOSIS — E876 Hypokalemia: Secondary | ICD-10-CM | POA: Diagnosis present

## 2015-05-10 DIAGNOSIS — Z951 Presence of aortocoronary bypass graft: Secondary | ICD-10-CM

## 2015-05-10 DIAGNOSIS — Z96651 Presence of right artificial knee joint: Secondary | ICD-10-CM | POA: Diagnosis present

## 2015-05-10 DIAGNOSIS — Z8249 Family history of ischemic heart disease and other diseases of the circulatory system: Secondary | ICD-10-CM

## 2015-05-10 DIAGNOSIS — R630 Anorexia: Secondary | ICD-10-CM | POA: Diagnosis present

## 2015-05-10 DIAGNOSIS — I2510 Atherosclerotic heart disease of native coronary artery without angina pectoris: Secondary | ICD-10-CM | POA: Diagnosis present

## 2015-05-10 LAB — URINE MICROSCOPIC-ADD ON: RBC / HPF: NONE SEEN RBC/hpf (ref 0–5)

## 2015-05-10 LAB — I-STAT CG4 LACTIC ACID, ED
Lactic Acid, Venous: 3.19 mmol/L (ref 0.5–2.0)
Lactic Acid, Venous: 3.72 mmol/L (ref 0.5–2.0)

## 2015-05-10 LAB — CBC WITH DIFFERENTIAL/PLATELET
BASOS ABS: 0 10*3/uL (ref 0.0–0.1)
Basophils Relative: 0 %
EOS ABS: 0.1 10*3/uL (ref 0.0–0.7)
Eosinophils Relative: 2 %
HCT: 31.9 % — ABNORMAL LOW (ref 39.0–52.0)
HEMOGLOBIN: 9.9 g/dL — AB (ref 13.0–17.0)
LYMPHS PCT: 22 %
Lymphs Abs: 1.6 10*3/uL (ref 0.7–4.0)
MCH: 21.9 pg — ABNORMAL LOW (ref 26.0–34.0)
MCHC: 31 g/dL (ref 30.0–36.0)
MCV: 70.6 fL — ABNORMAL LOW (ref 78.0–100.0)
Monocytes Absolute: 1.3 10*3/uL — ABNORMAL HIGH (ref 0.1–1.0)
Monocytes Relative: 18 %
NEUTROS ABS: 4.2 10*3/uL (ref 1.7–7.7)
Neutrophils Relative %: 58 %
Platelets: 128 10*3/uL — ABNORMAL LOW (ref 150–400)
RBC: 4.52 MIL/uL (ref 4.22–5.81)
RDW: 22.9 % — AB (ref 11.5–15.5)
WBC: 7.2 10*3/uL (ref 4.0–10.5)

## 2015-05-10 LAB — PROTIME-INR
INR: 3.22 — AB (ref 0.00–1.49)
PROTHROMBIN TIME: 32.3 s — AB (ref 11.6–15.2)

## 2015-05-10 LAB — COMPREHENSIVE METABOLIC PANEL
ALBUMIN: 2.8 g/dL — AB (ref 3.5–5.0)
ALK PHOS: 172 U/L — AB (ref 38–126)
ALT: 19 U/L (ref 17–63)
ANION GAP: 11 (ref 5–15)
AST: 33 U/L (ref 15–41)
BUN: 43 mg/dL — ABNORMAL HIGH (ref 6–20)
CHLORIDE: 96 mmol/L — AB (ref 101–111)
CO2: 24 mmol/L (ref 22–32)
Calcium: 8.9 mg/dL (ref 8.9–10.3)
Creatinine, Ser: 1.16 mg/dL (ref 0.61–1.24)
GFR calc Af Amer: >60 mL/min (ref >60)
GFR calc non Af Amer: 59 mL/min — ABNORMAL LOW (ref >60)
GLUCOSE: 114 mg/dL — AB (ref 65–99)
POTASSIUM: 4.5 mmol/L (ref 3.5–5.1)
SODIUM: 131 mmol/L — AB (ref 135–145)
Total Bilirubin: 4 mg/dL — ABNORMAL HIGH (ref 0.3–1.2)
Total Protein: 7.3 g/dL (ref 6.5–8.1)

## 2015-05-10 LAB — URINALYSIS, ROUTINE W REFLEX MICROSCOPIC
GLUCOSE, UA: NEGATIVE mg/dL
Hgb urine dipstick: NEGATIVE
KETONES UR: NEGATIVE mg/dL
NITRITE: NEGATIVE
PROTEIN: 30 mg/dL — AB
Specific Gravity, Urine: 1.02 (ref 1.005–1.030)
pH: 5.5 (ref 5.0–8.0)

## 2015-05-10 LAB — AMYLASE: AMYLASE: 110 U/L — AB (ref 28–100)

## 2015-05-10 LAB — AMMONIA: AMMONIA: 25 umol/L (ref 9–35)

## 2015-05-10 MED ORDER — VANCOMYCIN HCL IN DEXTROSE 1-5 GM/200ML-% IV SOLN
1000.0000 mg | Freq: Once | INTRAVENOUS | Status: AC
  Administered 2015-05-10: 1000 mg via INTRAVENOUS
  Filled 2015-05-10: qty 200

## 2015-05-10 MED ORDER — HYDROCODONE-ACETAMINOPHEN 5-325 MG PO TABS
1.0000 | ORAL_TABLET | Freq: Once | ORAL | Status: AC
  Administered 2015-05-10: 1 via ORAL
  Filled 2015-05-10: qty 1

## 2015-05-10 MED ORDER — SODIUM CHLORIDE 0.9 % IV BOLUS (SEPSIS)
1500.0000 mL | Freq: Once | INTRAVENOUS | Status: AC
  Administered 2015-05-10: 1500 mL via INTRAVENOUS

## 2015-05-10 MED ORDER — CLINDAMYCIN PHOSPHATE 300 MG/50ML IV SOLN
300.0000 mg | Freq: Four times a day (QID) | INTRAVENOUS | Status: DC
  Administered 2015-05-11: 300 mg via INTRAVENOUS
  Filled 2015-05-10 (×2): qty 50

## 2015-05-10 NOTE — ED Notes (Addendum)
Elmyra Ricks, [place] made aware of patient CG4 lactic result.

## 2015-05-10 NOTE — ED Notes (Signed)
Elmyra Ricks, [place] made aware of patient CG4 lactic result.

## 2015-05-10 NOTE — ED Provider Notes (Signed)
Pt seen and evaluated.  Discussed with PA-C. Patient has chronic thickened erythematous skin on the right lower leg. Increasing swelling and pain to the area. Now with drainage of fluid from several areas including the posterior aspect of the lower leg, and over the dorsum of the foot. 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  Initial lactate 3.72. His been given 1.5 L of fluid and will recheck. He is afebrile. INR 3.22. Urine does not appear infected. No leukocytosis. Clinically has scleral icterus and jaundice. Bilirubin of 4. Ultrasound abdomen does not show biliary dilatation or sign of obstruction. He is asymptomatic to his abdomen. Wife states that he's had elevation of his liver enzymes before with excessive Tylenol intake. Cultures been obtained. IV antibiotics and admission recommended.  Tanna Furry, MD 05/10/15 2330

## 2015-05-10 NOTE — ED Notes (Signed)
Per EMS, pt w/ hx of DM complains of 3 wounds to right lower leg. Pt states wounds began weeping 3 weeks ago. Pt uses wheelchair at home.

## 2015-05-10 NOTE — ED Provider Notes (Signed)
CSN: [id number]     Arrival date & time 05/10/15  1724 History   First MD Initiated Contact with Patient 05/10/15 1754     Chief Complaint  Patient presents with  . Wound Infection     (Consider location/radiation/quality/duration/timing/severity/associated sxs/prior Treatment) HPI   Patient is a 75 year old male past medical history of diabetes, hypertension, CAD, A. Fib (on Coumadin) and PVD who presents to the emergency department with worsening right leg wound. Patient reports over the past 3 weeks has had more swelling and redness to his right lower leg. He also endorses having associated clear weeping fluid from his right posterior calf that has been worsening over the past few days. Patient reports having associated weakness and shakes for the past month. He also notes he has been feeling mildly short of breath. Denies fever, chills, body aches, cough, chest pain, palpitations, abdominal pain, nausea, vomiting, diarrhea, urinary symptoms, numbness, tingling, weakness. 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  ID Dr. Wilder Glade.  Past Medical History  Diagnosis Date  . HTN (hypertension)   . Obesity   . History of cholecystectomy   . CAD (coronary artery disease)     Status post CABG followed by redo in 1994 / nuclear 2007, old infarct, no ischemia  . Ejection fraction < 50%     EF  45%...echo 2004 /  EF 45%, echo, August, 2012  . Hyperlipidemia   . Cigarette smoker     He is now smoking cigars and needs to quit.  . 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 no replacement until leg is stable  . Prostate cancer [facility])     History of prostate cancer with a seed implant and this is stable.  . Abdominal aortic aneurysm (HCC)     is stable, being followed  . Atrial fibrillation ([place])     Rate control / going in and out of atrial fib, Coumadin  . Volume overload   . Right bundle branch block     noted  November 09, 2008.  . Warfarin anticoagulation     Atrial fibrillation  . Carotid artery disease ([place])     Doppler September 2 [id number], [id number] LICA / Doppler October, 2010, 0-39% R. ICA [id number] LICA  . Diabetes mellitus   . Myocardial infarction ([place]) 1984  . Ulcer   . Peripheral vascular disease ([place])   . 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 Heart murmur   . Arthritis   . Tremor     ? caused via by antibiotic currently taking  . Bradycardia    Past Surgical History  Procedure Laterality Date  . Total knee arthroplasty    . Coronary artery bypass graft  1994    x2  . Anal fissure repair    . Pr vein bypass graft,aorto-fem-pop    . Leg amputation above knee  Oct. 23, 2012    Left Leg AKA  . Joint replacement      bil knee  . Abdominal aortic endovascular stent graft N/A 04/30/2012    Procedure:  Ultrasound guided,   inserstion of  ABDOMINAL AORTIC ENDOVASCULAR STENT GRAFT;  Surgeon: [name], MD;  Location: [place];  Service: Vascular;  Laterality: N/A;  EVAR- [place]  . I&d knee with poly exchange Right 07/13/2012    Procedure: IRRIGATION AND DEBRIDEMENT KNEE WITH POLY EXCHANGE;  Surgeon:  [name], MD;  Location: [facility];  Service: Orthopedics;  Laterality: Right;   Family History  Problem Relation Age of Onset  . Stroke Father 45  . Hypertension Father   . Diabetes Other   . Diabetes Mother   . Cancer Sister   . Diabetes Sister   . Cancer Brother   . Diabetes Brother   . Heart disease Brother    Social History  Substance Use Topics  . Smoking status: Current Every Day Smoker -- 0.50 packs/day for 30 years    Types: Cigarettes, Cigars    Start date: 07/07/2012  . Smokeless tobacco: Never Used     Comment: using E cig too  . Alcohol Use: No    Review of Systems  Respiratory: Positive for shortness of breath.   Musculoskeletal:       Left lower leg swelling and redness with clear drainage  Neurological: Positive for  weakness.      Allergies  Cefepime; Amoxicillin; and Morphine  Home Medications   Prior to Admission medications   Medication Sig Start Date End Date Taking? Authorizing Provider  atorvastatin (LIPITOR) 40 MG tablet Take 40 mg by mouth at bedtime.    Yes Historical Provider, MD  doxycycline (VIBRA-TABS) 100 MG tablet TAKE 1 TABLET TWICE A DAY 02/13/15  Yes Carlyle Basques, MD  furosemide (LASIX) 40 MG tablet Take 20 mg by mouth daily.    Yes Historical Provider, MD  gabapentin (NEURONTIN) 100 MG capsule Take 100 mg by mouth 3 (three) times daily as needed (pain).    Yes Historical Provider, MD  magnesium gluconate (MAGONATE) 500 MG tablet Take 500 mg by mouth daily.    Yes Historical Provider, MD  metFORMIN (GLUCOPHAGE) 850 MG tablet Take 850 mg by mouth 2 (two) times daily with a meal.    Yes Historical Provider, MD  metolazone (ZAROXOLYN) 2.5 MG tablet Take 2.5 mg by mouth daily.  05/28/12  Yes Historical Provider, MD  potassium chloride SA (K-DUR,KLOR-CON) 20 MEQ tablet Take 40 mEq by mouth every evening.    Yes Historical Provider, MD  spironolactone (ALDACTONE) 25 MG tablet Take 25 mg by mouth 2 (two) times daily. Take one (1) tablet by mouth two (2) times a day.   Yes Historical Provider, MD  traMADol (ULTRAM) 50 MG tablet Take 50 mg by mouth every 6 (six) hours as needed for moderate pain or severe pain.  11/17/14  Yes Historical Provider, MD  warfarin (COUMADIN) 2.5 MG tablet Take as directed by coumadin clinic Patient taking differently: Take 1.25 mg by mouth daily. Take as directed by coumadin clinic 04/13/15  Yes Jerline Pain, MD   BP 133/76 mmHg  Pulse 84  Temp(Src) 97.9 F (36.6 C) (Oral)  Resp 11  SpO2 99% Physical Exam  Constitutional: He is oriented to person, place, and time. He appears well-developed and well-nourished. No distress.  Morbidly obese male, jaundice appearing pt.  HENT:  Head: Normocephalic and atraumatic.  Mouth/Throat: Oropharynx is clear and moist. No  oropharyngeal exudate.  Eyes: Conjunctivae and EOM are normal. Pupils are equal, round, and reactive to light. Right eye exhibits no discharge. Left eye exhibits no discharge. Scleral icterus is present.  Neck: Normal range of motion. Neck supple.  Cardiovascular: Normal rate, regular rhythm, normal heart sounds and intact distal pulses.   Pulmonary/Chest: Effort normal. No respiratory distress. He has no wheezes. He has rales (fine crackles noted in RLL). He exhibits no tenderness.  Abdominal: Soft. Bowel sounds  are normal. He exhibits no distension and no mass. There is no tenderness. There is no rebound and no guarding.  Musculoskeletal: He exhibits edema.  2+ pitting edema noted to right foot extending up to inferior knee with chronic thickened erythema and associated discoloration from venous stasis. Small amount of serous fluid draining from posterior calf. Dec ROM of right knee, pt reports is unchanged s/p right knee surgery. Dec ROM of right ankle due to swelling.  Neurological: He is alert and oriented to person, place, and time.  Skin: Skin is warm and dry. He is not diaphoretic.  Nursing note and vitals reviewed.   ED Course  Procedures (including critical care time) Labs Review Labs Reviewed  URINALYSIS, ROUTINE W REFLEX MICROSCOPIC (NOT AT [facility]) - Abnormal; Notable for the following:    Color, Urine ORANGE (*)    APPearance CLOUDY (*)    Bilirubin Urine SMALL (*)    Protein, ur 30 (*)    Leukocytes, UA TRACE (*)    All other components within normal limits  CBC WITH DIFFERENTIAL/PLATELET - Abnormal; Notable for the following:    Hemoglobin 9.9 (*)    HCT 31.9 (*)    MCV 70.6 (*)    MCH 21.9 (*)    RDW 22.9 (*)    Platelets 128 (*)    Monocytes Absolute 1.3 (*)    All other components within normal limits  COMPREHENSIVE METABOLIC PANEL - Abnormal; Notable for the following:    Sodium 131 (*)    Chloride 96 (*)    Glucose, Bld 114 (*)    BUN 43 (*)    Albumin 2.8 (*)     Alkaline Phosphatase 172 (*)    Total Bilirubin 4.0 (*)    GFR calc non Af Amer 59 (*)    All other components within normal limits  PROTIME-INR - Abnormal; Notable for the following:    Prothrombin Time 32.3 (*)    INR 3.22 (*)    All other components within normal limits  AMYLASE - Abnormal; Notable for the following:    Amylase 110 (*)    All other components within normal limits  URINE MICROSCOPIC-ADD ON - Abnormal; Notable for the following:    Squamous Epithelial / LPF 0-5 (*)    Bacteria, UA FEW (*)    All other components within normal limits  I-STAT CG4 LACTIC ACID, ED - Abnormal; Notable for the following:    Lactic Acid, Venous 3.72 (*)    All other components within normal limits  I-STAT CG4 LACTIC ACID, ED - Abnormal; Notable for the following:    Lactic Acid, Venous 3.19 (*)    All other components within normal limits  CULTURE, BLOOD (ROUTINE X 2)  CULTURE, BLOOD (ROUTINE X 2)  AMMONIA    Imaging Review Dg Chest 2 View  05/10/2015  CLINICAL DATA:  Shortness of breath EXAM: CHEST  2 VIEW COMPARISON:  07/14/2012 chest radiograph. FINDINGS: Sternotomy wires appear aligned and intact. CABG clips overlie the mediastinum. Stable cardiomediastinal silhouette with mild cardiomegaly. No pneumothorax. Trace bilateral pleural effusions. Mild pulmonary edema. No acute consolidative airspace disease. IMPRESSION: 1. Mild congestive heart failure. 2. Trace bilateral pleural effusions. Electronically Signed   By: Ilona Sorrel M.D.   On: 05/10/2015 19:20   Dg Tibia/fibula Right  05/10/2015  CLINICAL DATA:  Swelling, erythema and weeping wounds. Diabetes mellitus. Clinical concern for osteomyelitis. EXAM: RIGHT TIBIA AND FIBULA - 2 VIEW COMPARISON:  None. FINDINGS: There is diffuse soft tissue  swelling. Surgical clips are noted in the medial soft tissues. Vascular calcifications are seen throughout the soft tissues. Patient is status post right total knee arthroplasty. There is prominent  periprosthetic lucency at the posterior margin of the right distal femoral prosthesis, which is new since 05/02/2003. No appreciable periprosthetic lucency at the proximal right tibial prosthesis. There is suggestion of erosive change at the posterior upper right patella. There is prominent prepatellar soft tissue swelling. Probable small right knee joint effusion. No additional cortical erosions are seen in the right tibia or right fibula. No fracture or suspicious focal osseous lesion. 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 IMPRESSION: 1. New periprosthetic lucency surrounding the right distal femoral prosthesis. New erosive change at the posterior upper right patella. Probable small right knee joint effusion. Prominent prepatellar soft tissue swelling. These findings suggest septic arthritis / prosthetic infection in the right knee. 2. No additional sites of potential bone infection in the right tibia/fibula at radiography. Electronically Signed   By: Ilona Sorrel M.D.   On: 05/10/2015 19:26   Dg Foot 2 Views Right  05/10/2015  CLINICAL DATA:  Diabetes mellitus. Right lower extremity swelling, erythema and wounds. EXAM: RIGHT FOOT - 2 VIEW COMPARISON:  None. FINDINGS: Severe diffuse soft tissue swelling. Vascular calcifications throughout the soft tissues. No appreciable soft tissue gas. No definite cortical erosions or periosteal reaction. Severe osteoarthritis at the first metatarsal-phalangeal joint. Mild osteoarthritis in the right ankle joint and tarsometatarsal joints. No fracture, dislocation or suspicious focal osseous lesion. Small Achilles and plantar right calcaneal spurs. IMPRESSION: Severe diffuse soft tissue swelling. No specific radiographic findings of osteomyelitis in the right foot. Electronically Signed   By: Ilona Sorrel M.D.   On: 05/10/2015 19:28   US Abdomen Limited Ruq  05/10/2015  CLINICAL DATA:  Jaundice. Elevated bilirubin and alkaline phosphatase. EXAM: US ABDOMEN LIMITED -  RIGHT UPPER QUADRANT COMPARISON:  CT, 06/24/2013 FINDINGS: Gallbladder: Multiple gallstones, largest measuring 1.7 cm lying in the gallbladder neck. No wall thickening. Wall measures 2.8 mm in greatest thickness. No pericholecystic fluid. No sonographic Murphy's sign. Common bile duct: Diameter: 4.4 mm Liver: Coarsened echotexture. Mildly nodular contour. No mass or focal lesion. IMPRESSION: 1. Cholelithiasis without evidence of acute cholecystitis. 2. No bile duct dilation. 3. Mildly coarsened echotexture of the liver with subtle surface nodularity. Consider cirrhosis in the proper clinical setting. Electronically Signed   By: Lajean Manes M.D.   On: 05/10/2015 22:56   I have personally reviewed and evaluated these images and lab results as part of my medical decision-making.   EKG Interpretation None      MDM   Final diagnoses:  Jaundice  Elevated bilirubin  Elevated alkaline phosphatase level    Patient presents with worsening right leg wound and associated shortness of breath and weakness. VSS exam revealed chronic thickened erythematous skin of right lower extremity with small amount of serous fluid draining from posterior leg, fine crackles noted and right lower lobe, scleral icterus and jaundice noted.   Initial lactic 3.72. Alkaline phosphatase 172. Bilirubin 4. Chest x-ray revealed mild CHF with trace bilateral pleural effusions. Right tib-fib x-ray revealed findings suggestive of septic arthritis, prosthetic infection in the right knee. Right foot x-ray revealed diffuse soft tissue swelling, no evidence of osteomyelitis. Due to elevated liver enzymes will order abdominal ultrasound for further evaluation.  Discussed patient with Dr. Jeneen Rinks who evaluated the patient in the ED. Labs ordered for PT/INR, amylase, ammonia and blood cultures. Patient started on IV Vanco  and Clinda in the ED. Patient given 1.5 L IV fluid bolus. Abdominal ultrasound revealed cholelithiasis without evidence of  acute cholecystitis and evidence of cirrhosis noted. INR 3.22. Amylase 110. Repeat lactic acid 3.19. Discussed results and plan for admission with patient. Consult at hospitalist. Dr. Laren Everts reports he will come evaluate the patient in the ED.        Chesley Noon [place], [place] 05/11/15 0110  Tanna Furry, MD 05/22/15 [phone]

## 2015-05-11 ENCOUNTER — Encounter (HOSPITAL_COMMUNITY): Payer: Self-pay | Admitting: *Deleted

## 2015-05-11 DIAGNOSIS — L03115 Cellulitis of right lower limb: Secondary | ICD-10-CM | POA: Diagnosis present

## 2015-05-11 DIAGNOSIS — I739 Peripheral vascular disease, unspecified: Secondary | ICD-10-CM | POA: Diagnosis present

## 2015-05-11 DIAGNOSIS — T8453XA Infection and inflammatory reaction due to internal right knee prosthesis, initial encounter: Principal | ICD-10-CM

## 2015-05-11 DIAGNOSIS — I252 Old myocardial infarction: Secondary | ICD-10-CM | POA: Diagnosis not present

## 2015-05-11 DIAGNOSIS — B9689 Other specified bacterial agents as the cause of diseases classified elsewhere: Secondary | ICD-10-CM | POA: Diagnosis not present

## 2015-05-11 DIAGNOSIS — E876 Hypokalemia: Secondary | ICD-10-CM | POA: Diagnosis present

## 2015-05-11 DIAGNOSIS — M00861 Arthritis due to other bacteria, right knee: Secondary | ICD-10-CM

## 2015-05-11 DIAGNOSIS — K831 Obstruction of bile duct: Secondary | ICD-10-CM

## 2015-05-11 DIAGNOSIS — L03119 Cellulitis of unspecified part of limb: Secondary | ICD-10-CM | POA: Diagnosis not present

## 2015-05-11 DIAGNOSIS — I10 Essential (primary) hypertension: Secondary | ICD-10-CM | POA: Diagnosis present

## 2015-05-11 DIAGNOSIS — I4891 Unspecified atrial fibrillation: Secondary | ICD-10-CM | POA: Diagnosis present

## 2015-05-11 DIAGNOSIS — L309 Dermatitis, unspecified: Secondary | ICD-10-CM | POA: Diagnosis present

## 2015-05-11 DIAGNOSIS — I5023 Acute on chronic systolic (congestive) heart failure: Secondary | ICD-10-CM | POA: Diagnosis present

## 2015-05-11 DIAGNOSIS — M79604 Pain in right leg: Secondary | ICD-10-CM | POA: Diagnosis present

## 2015-05-11 DIAGNOSIS — Z8249 Family history of ischemic heart disease and other diseases of the circulatory system: Secondary | ICD-10-CM | POA: Diagnosis not present

## 2015-05-11 DIAGNOSIS — E118 Type 2 diabetes mellitus with unspecified complications: Secondary | ICD-10-CM

## 2015-05-11 DIAGNOSIS — Z794 Long term (current) use of insulin: Secondary | ICD-10-CM | POA: Diagnosis not present

## 2015-05-11 DIAGNOSIS — Z89612 Acquired absence of left leg above knee: Secondary | ICD-10-CM | POA: Diagnosis not present

## 2015-05-11 DIAGNOSIS — I714 Abdominal aortic aneurysm, without rupture: Secondary | ICD-10-CM | POA: Diagnosis present

## 2015-05-11 DIAGNOSIS — F17210 Nicotine dependence, cigarettes, uncomplicated: Secondary | ICD-10-CM | POA: Diagnosis present

## 2015-05-11 DIAGNOSIS — Z9049 Acquired absence of other specified parts of digestive tract: Secondary | ICD-10-CM | POA: Diagnosis not present

## 2015-05-11 DIAGNOSIS — L304 Erythema intertrigo: Secondary | ICD-10-CM | POA: Diagnosis present

## 2015-05-11 DIAGNOSIS — D649 Anemia, unspecified: Secondary | ICD-10-CM | POA: Diagnosis present

## 2015-05-11 DIAGNOSIS — L02419 Cutaneous abscess of limb, unspecified: Secondary | ICD-10-CM | POA: Diagnosis present

## 2015-05-11 DIAGNOSIS — Z96651 Presence of right artificial knee joint: Secondary | ICD-10-CM | POA: Diagnosis present

## 2015-05-11 DIAGNOSIS — I878 Other specified disorders of veins: Secondary | ICD-10-CM | POA: Diagnosis present

## 2015-05-11 DIAGNOSIS — R630 Anorexia: Secondary | ICD-10-CM | POA: Diagnosis present

## 2015-05-11 DIAGNOSIS — Y831 Surgical operation with implant of artificial internal device as the cause of abnormal reaction of the patient, or of later complication, without mention of misadventure at the time of the procedure: Secondary | ICD-10-CM | POA: Diagnosis present

## 2015-05-11 DIAGNOSIS — E1159 Type 2 diabetes mellitus with other circulatory complications: Secondary | ICD-10-CM

## 2015-05-11 DIAGNOSIS — I2510 Atherosclerotic heart disease of native coronary artery without angina pectoris: Secondary | ICD-10-CM

## 2015-05-11 DIAGNOSIS — Z7901 Long term (current) use of anticoagulants: Secondary | ICD-10-CM | POA: Diagnosis not present

## 2015-05-11 DIAGNOSIS — Y838 Other surgical procedures as the cause of abnormal reaction of the patient, or of later complication, without mention of misadventure at the time of the procedure: Secondary | ICD-10-CM

## 2015-05-11 DIAGNOSIS — Z880 Allergy status to penicillin: Secondary | ICD-10-CM | POA: Diagnosis not present

## 2015-05-11 DIAGNOSIS — R791 Abnormal coagulation profile: Secondary | ICD-10-CM | POA: Diagnosis present

## 2015-05-11 DIAGNOSIS — E119 Type 2 diabetes mellitus without complications: Secondary | ICD-10-CM | POA: Diagnosis present

## 2015-05-11 DIAGNOSIS — Z833 Family history of diabetes mellitus: Secondary | ICD-10-CM | POA: Diagnosis not present

## 2015-05-11 DIAGNOSIS — I482 Chronic atrial fibrillation: Secondary | ICD-10-CM | POA: Diagnosis not present

## 2015-05-11 DIAGNOSIS — R17 Unspecified jaundice: Secondary | ICD-10-CM | POA: Diagnosis not present

## 2015-05-11 DIAGNOSIS — E669 Obesity, unspecified: Secondary | ICD-10-CM | POA: Diagnosis present

## 2015-05-11 DIAGNOSIS — Z6833 Body mass index (BMI) 33.0-33.9, adult: Secondary | ICD-10-CM | POA: Diagnosis not present

## 2015-05-11 DIAGNOSIS — Z951 Presence of aortocoronary bypass graft: Secondary | ICD-10-CM | POA: Diagnosis not present

## 2015-05-11 DIAGNOSIS — E785 Hyperlipidemia, unspecified: Secondary | ICD-10-CM | POA: Diagnosis present

## 2015-05-11 LAB — COMPREHENSIVE METABOLIC PANEL
ALBUMIN: 2.4 g/dL — AB (ref 3.5–5.0)
ALK PHOS: 143 U/L — AB (ref 38–126)
ALT: 16 U/L — ABNORMAL LOW (ref 17–63)
ANION GAP: 9 (ref 5–15)
AST: 27 U/L (ref 15–41)
BUN: 42 mg/dL — ABNORMAL HIGH (ref 6–20)
CHLORIDE: 99 mmol/L — AB (ref 101–111)
CO2: 26 mmol/L (ref 22–32)
Calcium: 8.6 mg/dL — ABNORMAL LOW (ref 8.9–10.3)
Creatinine, Ser: 1.16 mg/dL (ref 0.61–1.24)
GFR calc Af Amer: >60 mL/min (ref >60)
GFR calc non Af Amer: 59 mL/min — ABNORMAL LOW (ref >60)
GLUCOSE: 157 mg/dL — AB (ref 65–99)
POTASSIUM: 4 mmol/L (ref 3.5–5.1)
SODIUM: 134 mmol/L — AB (ref 135–145)
Total Bilirubin: 3.8 mg/dL — ABNORMAL HIGH (ref 0.3–1.2)
Total Protein: 6.3 g/dL — ABNORMAL LOW (ref 6.5–8.1)

## 2015-05-11 LAB — GLUCOSE, CAPILLARY
GLUCOSE-CAPILLARY: 91 mg/dL (ref 65–99)
GLUCOSE-CAPILLARY: 108 mg/dL — AB (ref 65–99)
Glucose-Capillary: 110 mg/dL — ABNORMAL HIGH (ref 65–99)
Glucose-Capillary: 138 mg/dL — ABNORMAL HIGH (ref 65–99)
Glucose-Capillary: 161 mg/dL — ABNORMAL HIGH (ref 65–99)

## 2015-05-11 LAB — PROTIME-INR
INR: 3.4 — AB (ref 0.00–1.49)
Prothrombin Time: 33.6 seconds — ABNORMAL HIGH (ref 11.6–15.2)

## 2015-05-11 MED ORDER — SODIUM CHLORIDE 0.9 % IV SOLN
500.0000 mg | Freq: Three times a day (TID) | INTRAVENOUS | Status: DC
  Administered 2015-05-11 – 2015-05-20 (×27): 500 mg via INTRAVENOUS
  Filled 2015-05-11 (×28): qty 500

## 2015-05-11 MED ORDER — TRAMADOL HCL 50 MG PO TABS
50.0000 mg | ORAL_TABLET | Freq: Four times a day (QID) | ORAL | Status: DC | PRN
  Administered 2015-05-11 – 2015-05-19 (×10): 50 mg via ORAL
  Filled 2015-05-11 (×10): qty 1

## 2015-05-11 MED ORDER — SODIUM CHLORIDE 0.9% FLUSH: 3.0000 mL | INTRAVENOUS | Status: DC | PRN

## 2015-05-11 MED ORDER — LEVOFLOXACIN IN D5W 500 MG/100ML IV SOLN
500.0000 mg | Freq: Every day | INTRAVENOUS | Status: DC
  Administered 2015-05-11: 500 mg via INTRAVENOUS
  Filled 2015-05-11: qty 100

## 2015-05-11 MED ORDER — ONDANSETRON HCL 4 MG PO TABS: 4.0000 mg | ORAL_TABLET | Freq: Four times a day (QID) | ORAL | Status: DC | PRN

## 2015-05-11 MED ORDER — POTASSIUM CHLORIDE CRYS ER 20 MEQ PO TBCR
40.0000 meq | EXTENDED_RELEASE_TABLET | Freq: Every evening | ORAL | Status: DC
  Administered 2015-05-11 – 2015-05-14 (×4): 40 meq via ORAL
  Filled 2015-05-11 (×6): qty 2

## 2015-05-11 MED ORDER — SODIUM CHLORIDE 0.9% FLUSH
3.0000 mL | Freq: Two times a day (BID) | INTRAVENOUS | Status: DC
  Administered 2015-05-12 – 2015-05-18 (×6): 3 mL via INTRAVENOUS

## 2015-05-11 MED ORDER — ONDANSETRON HCL 4 MG/2ML IJ SOLN: 4.0000 mg | Freq: Four times a day (QID) | INTRAMUSCULAR | Status: DC | PRN

## 2015-05-11 MED ORDER — SODIUM CHLORIDE 0.9 % IV SOLN: 250.0000 mL | INTRAVENOUS | Status: DC | PRN

## 2015-05-11 MED ORDER — ZOLPIDEM TARTRATE 5 MG PO TABS
5.0000 mg | ORAL_TABLET | Freq: Every evening | ORAL | Status: DC | PRN
  Administered 2015-05-19: 5 mg via ORAL
  Filled 2015-05-11 (×2): qty 1

## 2015-05-11 MED ORDER — WARFARIN - PHARMACIST DOSING INPATIENT: Freq: Every day | Status: DC

## 2015-05-11 MED ORDER — MAGNESIUM GLUCONATE 500 MG PO TABS
500.0000 mg | ORAL_TABLET | Freq: Every day | ORAL | Status: DC
  Administered 2015-05-11 – 2015-05-20 (×10): 500 mg via ORAL
  Filled 2015-05-11 (×10): qty 1

## 2015-05-11 MED ORDER — METOLAZONE 2.5 MG PO TABS
2.5000 mg | ORAL_TABLET | Freq: Every day | ORAL | Status: DC
  Administered 2015-05-11 – 2015-05-20 (×10): 2.5 mg via ORAL
  Filled 2015-05-11 (×10): qty 1

## 2015-05-11 MED ORDER — FUROSEMIDE 20 MG PO TABS
20.0000 mg | ORAL_TABLET | Freq: Every day | ORAL | Status: DC
  Administered 2015-05-11 – 2015-05-18 (×8): 20 mg via ORAL
  Filled 2015-05-11 (×8): qty 1

## 2015-05-11 MED ORDER — VANCOMYCIN HCL IN DEXTROSE 750-5 MG/150ML-% IV SOLN
750.0000 mg | Freq: Two times a day (BID) | INTRAVENOUS | Status: DC
  Administered 2015-05-11 – 2015-05-18 (×15): 750 mg via INTRAVENOUS
  Filled 2015-05-11 (×15): qty 150

## 2015-05-11 MED ORDER — GABAPENTIN 100 MG PO CAPS
100.0000 mg | ORAL_CAPSULE | Freq: Three times a day (TID) | ORAL | Status: DC | PRN
  Administered 2015-05-11 – 2015-05-17 (×4): 100 mg via ORAL
  Filled 2015-05-11 (×6): qty 1

## 2015-05-11 MED ORDER — ENSURE ENLIVE PO LIQD
237.0000 mL | Freq: Two times a day (BID) | ORAL | Status: DC
  Administered 2015-05-11 – 2015-05-20 (×16): 237 mL via ORAL

## 2015-05-11 MED ORDER — LEVOFLOXACIN IN D5W 750 MG/150ML IV SOLN
750.0000 mg | Freq: Every day | INTRAVENOUS | Status: DC
  Filled 2015-05-11: qty 150

## 2015-05-11 MED ORDER — ADULT MULTIVITAMIN W/MINERALS CH
1.0000 | ORAL_TABLET | Freq: Every day | ORAL | Status: DC
  Administered 2015-05-11 – 2015-05-20 (×10): 1 via ORAL
  Filled 2015-05-11 (×10): qty 1

## 2015-05-11 MED ORDER — INSULIN ASPART 100 UNIT/ML ~~LOC~~ SOLN
0.0000 [IU] | Freq: Three times a day (TID) | SUBCUTANEOUS | Status: DC
  Administered 2015-05-11: 2 [IU] via SUBCUTANEOUS
  Administered 2015-05-12: 1 [IU] via SUBCUTANEOUS
  Administered 2015-05-12: 2 [IU] via SUBCUTANEOUS
  Administered 2015-05-13 – 2015-05-15 (×5): 1 [IU] via SUBCUTANEOUS
  Administered 2015-05-15: 3 [IU] via SUBCUTANEOUS
  Administered 2015-05-16 (×2): 2 [IU] via SUBCUTANEOUS
  Administered 2015-05-17 – 2015-05-18 (×2): 1 [IU] via SUBCUTANEOUS
  Administered 2015-05-18 – 2015-05-19 (×3): 2 [IU] via SUBCUTANEOUS
  Administered 2015-05-19 (×2): 1 [IU] via SUBCUTANEOUS

## 2015-05-11 NOTE — Progress Notes (Signed)
CBG 91- light meal given to pt and wife.denies any pain at this time .will request WOC order to eval for tx of weeping thickened erythematous cellulitis of RLE & redness of bilat groins from wearing depends pta.vss,had been incont upon arrival to floor.Condom cath applied by request for comfort.Aggie Moats D

## 2015-05-11 NOTE — Progress Notes (Signed)
Initial Nutrition Assessment  DOCUMENTATION CODES:   Obesity unspecified  INTERVENTION:   Continue Ensure Enlive po BID, each supplement provides 350 kcal and 20 grams of protein Multivitamin with minerals daily RD to continue to monitor  NUTRITION DIAGNOSIS:   Increased nutrient needs related to wound healing as evidenced by estimated needs.  GOAL:   Patient will meet greater than or equal to 90% of their needs  MONITOR:   PO intake, Supplement acceptance, Labs, Weight trends, Skin, I & O's  REASON FOR ASSESSMENT:   Malnutrition Screening Tool    ASSESSMENT:   75 y.o. 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 Patient has decreased appetite and his weakness has been worse lately.  Pt in room with no family at bedside. Pt reports poor appetite that has steadily decreased over the past 3 months. Pt states that during the past month, he has been feeling full quickly when eating and has been vomiting. Pt states he does not check his blood sugars as he should. Per family in H&P, pt has not been eating enough. Pt with h/o L AKA and cellulitis on right leg. Pt has been drinking Ensure supplements. RD to monitor blood sugars.  Pt states he ate 100% of an omelette, 1/2 banana and biscuit for breakfast. Pt received lunch tray during visit, requested a diet ginger ale from nourishment room, RD provided.  Per weight history, pt's weight has increased.  Pt with no muscle or fat wasting.  Medications: LAsix tablet daily, Magonate tablet daily, K-DUR tablet daily  Labs reviewed: Low Na  Diet Order:  Diet Carb Modified Fluid consistency:: Thin; Room service appropriate?: Yes  Skin:  Wound (see comment) (Wound and cellulitis on R leg)  Last BM:  2/28  Height:   Ht Readings from Last 1  Encounters:  05/11/15 5\' 11"  (1.803 m)    Weight:   Wt Readings from Last 1 Encounters:  05/11/15 239 lb 13.8 oz (108.8 kg)    Ideal Body Weight:  70.5 kg (L AKA)  BMI:  Body mass index is 33.47 kg/(m^2).  Estimated Nutritional Needs:   Kcal:  2000-2200  Protein:  95-105g  Fluid:  1.8-2L/day  EDUCATION NEEDS:   Education needs addressed  Clayton Bibles, MS, RD, LDN Pager: [phone] After Hours Pager: [phone]

## 2015-05-11 NOTE — Progress Notes (Signed)
Text sent to [facility] [place] NP  Derek Frye that pt is verbally requesting a DNR status.Also confirmed by wife.Reported this in shift report.Aggie Moats D

## 2015-05-11 NOTE — Progress Notes (Signed)
[facility] PROGRESS NOTE  Derek Frye [id number] DOB: [birth year] DOA: 05/10/2015 PCP: Woody Seller, MD  Assessment/Plan: 1. 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 -Derek Frye presents with worsening right lower extremity erythema, pain, swelling. -Infectious disease consulted. -Based on previous cultures from 2014 that grew pseudomonas aeruginosa, Dr. Johnnye Sima recommended changing to Primaxin, and continuing vancomycin. -X-rays performed on admission showing new periprosthetic lucency surrounding the right distal femoral prosthesis with new erosive change at the posterior upper right patella. Findings assistant with prosthetic infection of right knee. -Above-the-knee amputation of his right lower extremity will likely be the definitive treatment -Patient expressing wishes to attempt limb salvage. Plan to treat with IV antibiotic therapy over the weekend and reassess on Monday. This is a reasonable approach as Derek Frye is nontoxic, afebrile, hemodynamically stable.  2.  History of atrial fibrillation -CHADVasc score of 4 -Derek Frye is chronically anticoagulated with warfarin. Pharmacy consulted for warfarin dosing. A.m. labs showing supratherapeutic INR of 3.4 with PTT of 33.6. -Derek Frye is rate controlled on telemetry.  3.  History of systolic congestive heart failure -Last transthoracic echocardiogram performed in 2011 showed an EF of 45%. -Continue metolazone 2.5 mg by mouth daily and Lasix 20 mg by mouth daily  4.  Type 2 diabetes mellitus. -Continue Accu-Cheks before meals and at bedtime -Blood sugars controlled  5.  History of coronary artery disease. -Status post coronary artery bypass grafting. Derek Frye does not  appear to have acute cardiac issues at this time.   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 Alvan Dame, had been on chronic suppression therapy with doxycycline. Previous cultures from 2014 positive for pseudomonas aeruginosa, organism resistant to ciprofloxacin, sensitive to Zosyn. Derek Frye was admitted to the medicine service on 05/10/2015 presenting to the emergency department with worsening right lower extremity erythema, pain, swelling. Derek Frye was started on empiric IV antibiotic therapy with vancomycin and Levaquin, changed to vancomycin and Primaxin. Derek Frye was evaluated by orthopedic surgery and infectious disease.   Objective: Filed Vitals:   05/11/15 0538 05/11/15 1405  BP: 132/69 123/66  Pulse: 66 70  Temp: 98.2 F (36.8 C) 97.3 F (36.3 C)  Resp: 17 17    Intake/Output Summary (Last 24 hours) at 05/11/15 1817 Last data filed at 05/11/15 1410  Gross per 24 hour  Intake   1160 ml  Output    275 ml  Net    885 ml   Filed Weights   05/11/15 0125  Weight: 108.8 kg (239 lb 13.8 oz)    Exam:   General:  Nontoxic-appearing, Derek Frye is awake and alert  Cardiovascular: Regular rate and rhythm normal S1-S2  Respiratory: Normal respiratory efforts  Abdomen: Obese, soft nontender nondistended  Musculoskeletal: Status post left above-the-knee amputation, right lower extremity chronic venous stasis changes, there is erythema from his foot extending above the knee. Weeping noted, did not appreciate  purulent discharge  Data Reviewed:  Basic Metabolic Panel:  Recent Labs Lab 05/10/15 1921 05/11/15 0403  NA 131* 134*  K 4.5 4.0  CL 96* 99*  CO2 24 26  GLUCOSE 114* 157*  BUN 43* 42*  CREATININE 1.16 1.16  CALCIUM 8.9 8.6*   Liver Function Tests:  Recent Labs Lab 05/10/15 1921 05/11/15 0403  AST 33 27  ALT 19 16*  ALKPHOS 172* 143*  BILITOT 4.0* 3.8*  PROT 7.3 6.3*  ALBUMIN 2.8* 2.4*    Recent Labs Lab 05/10/15 2128  AMYLASE 110*    Recent Labs Lab 05/10/15 2128  AMMONIA 25   CBC:  Recent Labs Lab 05/10/15 1921  WBC 7.2  NEUTROABS 4.2  HGB 9.9*  HCT 31.9*  MCV 70.6*  PLT 128*   Cardiac Enzymes: No results for input(s): CKTOTAL, CKMB, CKMBINDEX, TROPONINI in the last 168 hours. BNP (last 3 results) No results for input(s): BNP in the last 8760 hours.  ProBNP (last 3 results) No results for input(s): PROBNP in the last 8760 hours.  CBG:  Recent Labs Lab 05/11/15 0204 05/11/15 0742 05/11/15 1127 05/11/15 1639  GLUCAP 91 108* 110* 138*    No results found for this or any previous visit (from the past 240 hour(s)).   Studies: Dg Chest 2 View  05/10/2015  CLINICAL DATA:  Shortness of breath EXAM: CHEST  2 VIEW COMPARISON:  07/14/2012 chest radiograph. FINDINGS: Sternotomy wires appear aligned and intact. CABG clips overlie the mediastinum. Stable cardiomediastinal silhouette with mild cardiomegaly. No pneumothorax. Trace bilateral pleural effusions. Mild pulmonary edema. No acute consolidative airspace disease. IMPRESSION: 1. Mild congestive heart failure. 2. Trace bilateral pleural effusions. Electronically Signed   By: Ilona Sorrel M.D.   On: 05/10/2015 19:20   Dg Tibia/fibula Right  05/10/2015  CLINICAL DATA:  Swelling, erythema and weeping wounds. Diabetes mellitus. Clinical concern for osteomyelitis. EXAM: RIGHT TIBIA AND FIBULA - 2 VIEW COMPARISON:  None. FINDINGS: There is diffuse soft tissue swelling. Surgical clips are noted in the  medial soft tissues. Vascular calcifications are seen throughout the soft tissues. Patient is status post right total knee arthroplasty. There is prominent periprosthetic lucency at the posterior margin of the right distal femoral prosthesis, which is new since 05/02/2003. No appreciable periprosthetic lucency at the proximal right tibial prosthesis. There is suggestion of erosive change at the posterior upper right patella. There is prominent prepatellar soft tissue swelling. Probable small right knee joint effusion. No additional cortical erosions are seen in the right tibia or right fibula. No fracture or suspicious focal osseous lesion. 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 IMPRESSION: 1. New periprosthetic lucency surrounding the right distal femoral prosthesis. New erosive change at the posterior upper right patella. Probable small right knee joint effusion. Prominent prepatellar soft tissue swelling. These findings suggest septic arthritis / prosthetic infection in the right knee. 2. No additional sites of potential bone infection in the right tibia/fibula at radiography. Electronically Signed   By: Ilona Sorrel M.D.   On: 05/10/2015 19:26   Dg Foot 2 Views Right  05/10/2015  CLINICAL DATA:  Diabetes mellitus. Right lower extremity swelling, erythema and wounds. EXAM: RIGHT FOOT - 2 VIEW COMPARISON:  None. FINDINGS: Severe diffuse soft tissue swelling. Vascular calcifications throughout the soft tissues. No appreciable soft tissue gas. No definite cortical erosions or periosteal reaction. Severe osteoarthritis at the first metatarsal-phalangeal joint. Mild osteoarthritis in the right ankle joint and tarsometatarsal joints. No fracture, dislocation or suspicious focal osseous lesion. Small Achilles and plantar right calcaneal  spurs. IMPRESSION: Severe diffuse soft tissue swelling. No specific radiographic findings of osteomyelitis in the right foot. Electronically Signed   By: Ilona Sorrel M.D.   On:  05/10/2015 19:28   US Abdomen Limited Ruq  05/10/2015  CLINICAL DATA:  Jaundice. Elevated bilirubin and alkaline phosphatase. EXAM: US ABDOMEN LIMITED - RIGHT UPPER QUADRANT COMPARISON:  CT, 06/24/2013 FINDINGS: Gallbladder: Multiple gallstones, largest measuring 1.7 cm lying in the gallbladder neck. No wall thickening. Wall measures 2.8 mm in greatest thickness. No pericholecystic fluid. No sonographic Murphy's sign. Common bile duct: Diameter: 4.4 mm Liver: Coarsened echotexture. Mildly nodular contour. No mass or focal lesion. IMPRESSION: 1. Cholelithiasis without evidence of acute cholecystitis. 2. No bile duct dilation. 3. Mildly coarsened echotexture of the liver with subtle surface nodularity. Consider cirrhosis in the proper clinical setting. Electronically Signed   By: Lajean Manes M.D.   On: 05/10/2015 22:56    Scheduled Meds: . feeding supplement (ENSURE ENLIVE)  237 mL Oral BID BM  . furosemide  20 mg Oral Daily  . imipenem-cilastatin  500 mg Intravenous Q8H  . insulin aspart  0-9 Units Subcutaneous TID WC  . magnesium gluconate  500 mg Oral Daily  . metolazone  2.5 mg Oral Daily  . multivitamin with minerals  1 tablet Oral Daily  . potassium chloride SA  40 mEq Oral QPM  . sodium chloride flush  3 mL Intravenous Q12H  . vancomycin  750 mg Intravenous Q12H  . Warfarin - Pharmacist Dosing Inpatient   Does not apply q1800   Continuous Infusions:   Active Problems:   Cellulitis and abscess of leg    Time spent: 35 min    Kelvin Cellar  [facility] Pager [phone]. If 7PM-7AM, please contact night-coverage at [email or web address], password [facility] 05/11/2015, 6:17 PM  LOS: 0 days

## 2015-05-11 NOTE — Progress Notes (Signed)
ANTICOAGULATION CONSULT NOTE - Initial Consult  Pharmacy Consult for Warfarin Indication: atrial fibrillation  Allergies  Allergen Reactions  . Cefepime     ? tremors  . Amoxicillin     REACTION: swelling and a rash  . Morphine Nausea And Vomiting    Patient Measurements: Height: 5\' 11"  (180.3 cm) Weight: 239 lb 13.8 oz (108.8 kg) (stated last time weighed was 230 lbs) IBW/kg (Calculated) : 75.3 Heparin Dosing Weight:   Vital Signs: Temp: 98.2 F (36.8 C) (03/02 0538) Temp Source: Axillary (03/02 0538) BP: 132/69 mmHg (03/02 0538) Pulse Rate: 66 (03/02 0538)  Labs:  Recent Labs  05/10/15 1921 05/10/15 2128 05/11/15 0403  HGB 9.9*  --   --   HCT 31.9*  --   --   PLT 128*  --   --   LABPROT  --  32.3* 33.6*  INR  --  3.22* 3.40*  CREATININE 1.16  --  1.16    Estimated Creatinine Clearance: 68 mL/min (by C-G formula based on Cr of 1.16).   Medical History: Past Medical History  Diagnosis Date  . HTN (hypertension)   . Obesity   . History of cholecystectomy   . CAD (coronary artery disease)     Status post CABG followed by redo in 1994 / nuclear 2007, old infarct, no ischemia  . Ejection fraction < 50%     EF  45%...echo 2004 /  EF 45%, echo, August, 2012  . Hyperlipidemia   . Cigarette smoker     He is now smoking cigars and needs to quit.  . 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 no replacement until leg is stable  . Prostate cancer [facility])     History of prostate cancer with a seed implant and this is stable.  . Abdominal aortic aneurysm (HCC)     is stable, being followed  . Atrial fibrillation ([place])     Rate control / going in and out of atrial fib, Coumadin  . Volume overload   . Right bundle branch block     noted November 09, 2008.  . Warfarin anticoagulation     Atrial fibrillation  . Carotid artery disease ([place])     Doppler September 2 [id number], [id number] LICA / Doppler October, 2010, 0-39% R. ICA [id number] LICA  . Diabetes  mellitus   . Myocardial infarction ([place]) 1984  . Ulcer   . Peripheral vascular disease ([place])   . 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 Heart murmur   . Arthritis   . Tremor     ? caused via by antibiotic currently taking  . Bradycardia     Medications:  Prescriptions prior to admission  Medication Sig Dispense Refill Last Dose  . atorvastatin (LIPITOR) 40 MG tablet Take 40 mg by mouth at bedtime.    05/09/2015 at Unknown time  . doxycycline (VIBRA-TABS) 100 MG tablet TAKE 1 TABLET TWICE A DAY 180 tablet 0 05/10/2015 at Unknown time  . furosemide (LASIX) 40 MG tablet Take 20 mg by mouth daily.    05/09/2015 at Unknown time  . gabapentin (NEURONTIN) 100 MG capsule Take 100 mg by mouth 3 (three) times daily as needed (pain).    Past Week at Unknown time  . magnesium gluconate (MAGONATE) 500 MG tablet Take 500 mg by mouth daily.    Past Week at Unknown time  . metFORMIN (GLUCOPHAGE) 850 MG tablet Take  850 mg by mouth 2 (two) times daily with a meal.    05/10/2015 at Unknown time  . metolazone (ZAROXOLYN) 2.5 MG tablet Take 2.5 mg by mouth daily.    05/10/2015 at Unknown time  . potassium chloride SA (K-DUR,KLOR-CON) 20 MEQ tablet Take 40 mEq by mouth every evening.    05/09/2015 at Unknown time  . spironolactone (ALDACTONE) 25 MG tablet Take 25 mg by mouth 2 (two) times daily. Take one (1) tablet by mouth two (2) times a day.   05/09/2015 at Unknown time  . traMADol (ULTRAM) 50 MG tablet Take 50 mg by mouth every 6 (six) hours as needed for moderate pain or severe pain.    05/09/2015 at Unknown time  . warfarin (COUMADIN) 2.5 MG tablet Take as directed by coumadin clinic (Patient taking differently: Take 1.25 mg by mouth daily. Take as directed by coumadin clinic) 75 tablet 0 05/10/2015 at 1530   Scheduled:  . feeding supplement (ENSURE ENLIVE)  237 mL Oral BID BM  . furosemide  20 mg Oral Daily  . insulin aspart  0-9 Units Subcutaneous TID WC  . levofloxacin  (LEVAQUIN) IV  500 mg Intravenous QHS  . magnesium gluconate  500 mg Oral Daily  . metolazone  2.5 mg Oral Daily  . potassium chloride SA  40 mEq Oral QPM  . sodium chloride flush  3 mL Intravenous Q12H  . vancomycin  750 mg Intravenous Q12H  . Warfarin - Pharmacist Dosing Inpatient   Does not apply q1800    Assessment: Patient with afib on chronic warfarin.  INR on admit > 3 and still > 3 with 3/2 AM labs.  Goal of Therapy:  INR 2-3    Plan:  No warfarin at this time. INR daily  Nani Skillern Crowford 05/11/2015,6:41 AM

## 2015-05-11 NOTE — Consult Note (Signed)
WOC wound consult note Reason for Consult:right LE with venous insufficiency, complicated by extensive orthopedic (Dr. Alvan Dame) history at knee.  Dorsal foot with purple area suggestive of vascular occlusions.  Skin condition consistent with lymphedema of long standing duration. Patient unable to bend right knee; unable to correct lateral rotation of LE and foot. Wound type:Venous insufficiency. Pressure Ulcer POA: No Measurement: dorsal foot 6cm x 9cm with partial thickness tissue loss leaking small amount of light yellow exudate. Right lateral LE and great toe with full thickness wounds, dry. Wound [id number], wet Drainage (amount, consistency, odor) serous, small amount.  Was more prior to LE elevation associated with hospital admission Periwound: ruddy red hemosiderin staining and trophic changes consistent with lymphedema Dressing procedure/placement/frequency:I have provided Nursing with guidance for conservative topical care twice daily. If desired, consult VVS or orthopedics for evaluation. [facility] nursing team will not follow, but will remain available to this patient, the nursing and medical teams.  Please re-consult if needed. Thanks, Maudie Flakes, MSN, RN, [place], Arther Abbott  Pager# [phone]

## 2015-05-11 NOTE — Plan of Care (Signed)
RN, Cyndi, paged this NP because the police served Derek Frye with IVC papers that stated he was suicidal in the ED and wanted to shoot himself. Papers were filled out by Dr. Laneta Simmers in ED. NP reviewed chart and Dr. Laneta Simmers did not see Derek Frye in the ED. When our ([facility])MD saw pt to admit there were no comments anywhere about Derek Frye being suicidal. The wife and pt adamantly deny these circumstances. NP called ED and found out the IVC papers were filed on the wrong pt. Art, Network engineer in the ED, will fill out retraction papers to send to the magistrate and medical records so this will not be on Mr. Pink' record. IVC papers sent to Art in the ED for further actions. Apologies made to Mr. and Derek Frye for this mix up.  KJKG, NP [facility]

## 2015-05-11 NOTE — Progress Notes (Signed)
Pharmacy Antibiotic Note  Derek Frye is a 75 y.o. male admitted on 05/10/2015 with R below the knee cellulitis, abscess. 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 H/o Left AKA. Pharmacy has been consulted for Vancomycin and Levaquin dosing.  Patient has allergies listed to amoxicillin and cefepime.   Plan: Given that there is potential abscess and prosthetic joint infection, will increase Levaquin dose to 750 mg q24h. Continue Vancomycin 750 mg IV q12h. F/u culture results and ID recommendations.   Height: 5\' 11"  (180.3 cm) Weight: 239 lb 13.8 oz (108.8 kg) (stated last time weighed was 230 lbs) IBW/kg (Calculated) : 75.3  Temp (24hrs), Avg:98 F (36.7 C), Min:97.9 F (36.6 C), Max:98.2 F (36.8 C)   Recent Labs Lab 05/10/15 1921 05/10/15 1932 05/10/15 2344 05/11/15 0403  WBC 7.2  --   --   --   CREATININE 1.16  --   --  1.16  LATICACIDVEN  --  3.72* 3.19*  --     Estimated Creatinine Clearance: 68 mL/min (by C-G formula based on Cr of 1.16).    Allergies  Allergen Reactions  . Cefepime     ? tremors  . Amoxicillin     REACTION: swelling and a rash  . Morphine Nausea And Vomiting    Antimicrobials this admission: Levofloxacin 3/2 >> Vancomcyin 3/1 >>  Dose adjustments this admission: -  Microbiology results: 3/1 BCx: sent  Thank you for allowing pharmacy to be a part of this patient's care.  [name] 05/11/2015 11:08 AM

## 2015-05-11 NOTE — Consult Note (Signed)
ORTHOPAEDIC CONSULTATION  REQUESTING PHYSICIAN: Kelvin Cellar, MD  PCP:  Woody Seller, MD  Chief Complaint: right lower leg cellulitis, h/o infected TKA  HPI: Derek Frye is a 75 y.o. male patient of Dr. Alvan Dame who has history of L AKA for chronically infected TKA and cellulitis due to chronic venous insufficiency. Had infected R TKA s/p I&D / poly exchange on chronic PO suppression in 2014. Came to the ED yesterday with 3 weeks of worsening RLE cellulitis. Patient not septic. ID saw patient and he is on IV abx.  Past Medical History  Diagnosis Date  . HTN (hypertension)   . Obesity   . History of cholecystectomy   . CAD (coronary artery disease)     Status post CABG followed by redo in 1994 / nuclear 2007, old infarct, no ischemia  . Ejection fraction < 50%     EF  45%...echo 2004 /  EF 45%, echo, August, 2012  . Hyperlipidemia   . Cigarette smoker     He is now smoking cigars and needs to quit.  . 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 no replacement until leg is stable  . Prostate cancer [facility])     History of prostate cancer with a seed implant and this is stable.  . Abdominal aortic aneurysm (HCC)     is stable, being followed  . Atrial fibrillation ([place])     Rate control / going in and out of atrial fib, Coumadin  . Volume overload   . Right bundle branch block     noted November 09, 2008.  . Warfarin anticoagulation     Atrial fibrillation  . Carotid artery disease ([place])     Doppler September 2 [id number], [id number] LICA / Doppler October, 2010, 0-39% R. ICA [id number] LICA  . Diabetes mellitus   . Myocardial infarction ([place]) 1984  . Ulcer   . Peripheral vascular disease ([place])   . 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 Heart murmur   . Arthritis   . Tremor     ? caused via by antibiotic currently taking  . Bradycardia    Past Surgical History  Procedure Laterality Date  . Total knee arthroplasty     . Coronary artery bypass graft  1994    x2  . Anal fissure repair    . Pr vein bypass graft,aorto-fem-pop    . Leg amputation above knee  Oct. 23, 2012    Left Leg AKA  . Joint replacement      bil knee  . Abdominal aortic endovascular stent graft N/A 04/30/2012    Procedure:  Ultrasound guided,   inserstion of  ABDOMINAL AORTIC ENDOVASCULAR STENT GRAFT;  Surgeon: [name], MD;  Location: [place];  Service: Vascular;  Laterality: N/A;  EVAR- [place]  . I&d knee with poly exchange Right 07/13/2012    Procedure: IRRIGATION AND DEBRIDEMENT KNEE WITH POLY EXCHANGE;  Surgeon: [name], MD;  Location: [facility];  Service: Orthopedics;  Laterality: Right;   Social History   Social History  . Marital Status: Married    Spouse Name: N/A  . Number of Children: N/A  . Years of Education: N/A   Social History Main Topics  . Smoking status: Current Every Day Smoker -- 0.50 packs/day for 30 years    Types: Cigarettes, Cigars    Start date: 07/07/2012  . Smokeless tobacco: Never Used  Comment: using E cig too  . Alcohol Use: No  . Drug Use: No  . Sexual Activity: Not Asked   Other Topics Concern  . None   Social History Narrative   Family History  Problem Relation Age of Onset  . Stroke Father 30  . Hypertension Father   . Diabetes Other   . Diabetes Mother   . Cancer Sister   . Diabetes Sister   . Cancer Brother   . Diabetes Brother   . Heart disease Brother    Allergies  Allergen Reactions  . Cefepime     ? tremors  . Amoxicillin     REACTION: swelling and a rash  . Morphine Nausea And Vomiting   Prior to Admission medications   Medication Sig Start Date End Date Taking? Authorizing Provider  atorvastatin (LIPITOR) 40 MG tablet Take 40 mg by mouth at bedtime.    Yes Historical Provider, MD  doxycycline (VIBRA-TABS) 100 MG tablet TAKE 1 TABLET TWICE A DAY 02/13/15  Yes Carlyle Basques, MD  furosemide (LASIX) 40 MG tablet Take 20 mg by mouth daily.    Yes  Historical Provider, MD  gabapentin (NEURONTIN) 100 MG capsule Take 100 mg by mouth 3 (three) times daily as needed (pain).    Yes Historical Provider, MD  magnesium gluconate (MAGONATE) 500 MG tablet Take 500 mg by mouth daily.    Yes Historical Provider, MD  metFORMIN (GLUCOPHAGE) 850 MG tablet Take 850 mg by mouth 2 (two) times daily with a meal.    Yes Historical Provider, MD  metolazone (ZAROXOLYN) 2.5 MG tablet Take 2.5 mg by mouth daily.  05/28/12  Yes Historical Provider, MD  potassium chloride SA (K-DUR,KLOR-CON) 20 MEQ tablet Take 40 mEq by mouth every evening.    Yes Historical Provider, MD  spironolactone (ALDACTONE) 25 MG tablet Take 25 mg by mouth 2 (two) times daily. Take one (1) tablet by mouth two (2) times a day.   Yes Historical Provider, MD  traMADol (ULTRAM) 50 MG tablet Take 50 mg by mouth every 6 (six) hours as needed for moderate pain or severe pain.  11/17/14  Yes Historical Provider, MD  warfarin (COUMADIN) 2.5 MG tablet Take as directed by coumadin clinic Patient taking differently: Take 1.25 mg by mouth daily. Take as directed by coumadin clinic 04/13/15  Yes Jerline Pain, MD   Dg Chest 2 View  05/10/2015  CLINICAL DATA:  Shortness of breath EXAM: CHEST  2 VIEW COMPARISON:  07/14/2012 chest radiograph. FINDINGS: Sternotomy wires appear aligned and intact. CABG clips overlie the mediastinum. Stable cardiomediastinal silhouette with mild cardiomegaly. No pneumothorax. Trace bilateral pleural effusions. Mild pulmonary edema. No acute consolidative airspace disease. IMPRESSION: 1. Mild congestive heart failure. 2. Trace bilateral pleural effusions. Electronically Signed   By: Ilona Sorrel M.D.   On: 05/10/2015 19:20   Dg Tibia/fibula Right  05/10/2015  CLINICAL DATA:  Swelling, erythema and weeping wounds. Diabetes mellitus. Clinical concern for osteomyelitis. EXAM: RIGHT TIBIA AND FIBULA - 2 VIEW COMPARISON:  None. FINDINGS: There is diffuse soft tissue swelling. Surgical clips are  noted in the medial soft tissues. Vascular calcifications are seen throughout the soft tissues. Patient is status post right total knee arthroplasty. There is prominent periprosthetic lucency at the posterior margin of the right distal femoral prosthesis, which is new since 05/02/2003. No appreciable periprosthetic lucency at the proximal right tibial prosthesis. There is suggestion of erosive change at the posterior upper right patella. There is prominent prepatellar soft  tissue swelling. Probable small right knee joint effusion. No additional cortical erosions are seen in the right tibia or right fibula. No fracture or suspicious focal osseous lesion. 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 IMPRESSION: 1. New periprosthetic lucency surrounding the right distal femoral prosthesis. New erosive change at the posterior upper right patella. Probable small right knee joint effusion. Prominent prepatellar soft tissue swelling. These findings suggest septic arthritis / prosthetic infection in the right knee. 2. No additional sites of potential bone infection in the right tibia/fibula at radiography. Electronically Signed   By: Ilona Sorrel M.D.   On: 05/10/2015 19:26   Dg Foot 2 Views Right  05/10/2015  CLINICAL DATA:  Diabetes mellitus. Right lower extremity swelling, erythema and wounds. EXAM: RIGHT FOOT - 2 VIEW COMPARISON:  None. FINDINGS: Severe diffuse soft tissue swelling. Vascular calcifications throughout the soft tissues. No appreciable soft tissue gas. No definite cortical erosions or periosteal reaction. Severe osteoarthritis at the first metatarsal-phalangeal joint. Mild osteoarthritis in the right ankle joint and tarsometatarsal joints. No fracture, dislocation or suspicious focal osseous lesion. Small Achilles and plantar right calcaneal spurs. IMPRESSION: Severe diffuse soft tissue swelling. No specific radiographic findings of osteomyelitis in the right foot. Electronically Signed   By: Ilona Sorrel M.D.   On: 05/10/2015 19:28   US Abdomen Limited Ruq  05/10/2015  CLINICAL DATA:  Jaundice. Elevated bilirubin and alkaline phosphatase. EXAM: US ABDOMEN LIMITED - RIGHT UPPER QUADRANT COMPARISON:  CT, 06/24/2013 FINDINGS: Gallbladder: Multiple gallstones, largest measuring 1.7 cm lying in the gallbladder neck. No wall thickening. Wall measures 2.8 mm in greatest thickness. No pericholecystic fluid. No sonographic Murphy's sign. Common bile duct: Diameter: 4.4 mm Liver: Coarsened echotexture. Mildly nodular contour. No mass or focal lesion. IMPRESSION: 1. Cholelithiasis without evidence of acute cholecystitis. 2. No bile duct dilation. 3. Mildly coarsened echotexture of the liver with subtle surface nodularity. Consider cirrhosis in the proper clinical setting. Electronically Signed   By: Lajean Manes M.D.   On: 05/10/2015 22:56    Positive ROS: All other systems have been reviewed and were otherwise negative with the exception of those mentioned in the HPI and as above.  Physical Exam: General: Alert, no acute distress Cardiovascular: No pedal edema Respiratory: No cyanosis, no use of accessory musculature GI: No organomegaly, abdomen is soft and non-tender Skin: No lesions in the area of chief complaint Neurologic: Sensation intact distally Psychiatric: Patient is competent for consent with normal mood and affect Lymphatic: No axillary or cervical lymphadenopathy  MUSCULOSKELETAL:  LLE: healed AKA. RLE:  healed TKA incision. Knee very stiff and contracted (baseline). No effusion or erythema. LE with chronic venous stasis changes and cellulitis and weeping blisters. Ankle and foot stiff. Dopplerable pedal pulses. Reports absent sensation (baseline).  Assessment: RLE cellulitis due to chronic venous stasis, acute on chronic PJI R TKA on abx  Plan: I discussed the situation with the family and patient - will likely come to AKA at some point. They want to salvage his leg. It would be  reasonable to try IV abx, as he is not currently septic. They want to try IV abx through the weekend and discuss definitive treatment with Dr. Alvan Dame on Monday.    Emmanuell Kantz, Horald Pollen, MD Cell [phone]    05/11/2015 5:52 PM

## 2015-05-11 NOTE — Consult Note (Signed)
[facility]  Date of Admission:  05/10/2015  Date of Consult:  05/11/2015  Reason for Consult: Prosthetic Joint Infection Referring Physician: Hijazi  Impression/Recommendation Prosthetic Joint Infection Cellulitis Will continue vanco Change levaquin to imipenem (his prev isolate was R- flouroquinolones) Ortho eval Consider further imaging (will order CT) to define if abscess present Agree, his leg may not be salvage-able at this point.  Discussed with ortho  DM2 with complications Would check his A1C F/u FSG  Thank you so much for this interesting consult,   [name] (pager) [phone] [email or web address]  Derek Frye is an 75 y.o. male.  HPI: 75 yo M with hx of CAD/CABG, DM2 (last A1C 6.1 per wife), L AKA, and R knee prosthesis infection since 2014 (his cx at that time grew Pseudomonas R-Cipro). He received 8 weeks of imipenem, then onto suppressive doxy.  He was last seen in ID on 12-20 and he was to complete his doxy around that time and be observed off anbx (his ESR had normalized, CRP 2.8). He had not completed his anbx yet on admission.  He comes to hospital on 3-2 with 3 weeks of worsening R knee pain, swelling, inability to bear weight, anorexia, and weakness. He has had f/c, sweats. He was afebrile in ED and his WBC was normal.  He had plain films showing: 1. New periprosthetic lucency surrounding the right distal femoral prosthesis. New erosive change at the posterior upper right patella. Probable small right knee joint effusion. Prominent prepatellar soft tissue swelling. These findings suggest septic arthritis / prosthetic infection in the right knee. 2. No additional sites of potential bone infection in the right tibia/fibula at radiography.  Past Medical History  Diagnosis Date  . HTN (hypertension)   . Obesity   . History of cholecystectomy   . CAD (coronary artery disease)     Status post CABG followed by redo in 1994  / nuclear 2007, old infarct, no ischemia  . Ejection fraction < 50%     EF  45%...echo 2004 /  EF 45%, echo, August, 2012  . Hyperlipidemia   . Cigarette smoker     He is now smoking cigars and needs to quit.  . 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 no replacement until leg is stable  . Prostate cancer [facility])     History of prostate cancer with a seed implant and this is stable.  . Abdominal aortic aneurysm (HCC)     is stable, being followed  . Atrial fibrillation ([place])     Rate control / going in and out of atrial fib, Coumadin  . Volume overload   . Right bundle branch block     noted November 09, 2008.  . Warfarin anticoagulation     Atrial fibrillation  . Carotid artery disease ([place])     Doppler September 2 295, 28-41% LICA / Doppler October, 2010, 0-39% R. ICA 32-44% LICA  . Diabetes mellitus   . Myocardial infarction ([place]) 1984  . Ulcer   . Peripheral vascular disease ([place])   . 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 Heart murmur   . Arthritis   . Tremor     ? caused via by antibiotic currently taking  . Bradycardia     Past Surgical History  Procedure Laterality Date  . Total knee arthroplasty    . Coronary artery bypass graft  1994  x2  . Anal fissure repair    . Pr vein bypass graft,aorto-fem-pop    . Leg amputation above knee  Oct. 23, 2012    Left Leg AKA  . Joint replacement      bil knee  . Abdominal aortic endovascular stent graft N/A 04/30/2012    Procedure:  Ultrasound guided,   inserstion of  ABDOMINAL AORTIC ENDOVASCULAR STENT GRAFT;  Surgeon: [name], MD;  Location: [place];  Service: Vascular;  Laterality: N/A;  EVAR- [place]  . I&d knee with poly exchange Right 07/13/2012    Procedure: IRRIGATION AND DEBRIDEMENT KNEE WITH POLY EXCHANGE;  Surgeon: [name], MD;  Location: [facility];  Service: Orthopedics;  Laterality: Right;     Allergies  Allergen Reactions  . Cefepime      ? tremors  . Amoxicillin     REACTION: swelling and a rash  . Morphine Nausea And Vomiting    Medications:  Scheduled: . feeding supplement (ENSURE ENLIVE)  237 mL Oral BID BM  . furosemide  20 mg Oral Daily  . insulin aspart  0-9 Units Subcutaneous TID WC  . levofloxacin (LEVAQUIN) IV  750 mg Intravenous QHS  . magnesium gluconate  500 mg Oral Daily  . metolazone  2.5 mg Oral Daily  . multivitamin with minerals  1 tablet Oral Daily  . potassium chloride SA  40 mEq Oral QPM  . sodium chloride flush  3 mL Intravenous Q12H  . vancomycin  750 mg Intravenous Q12H  . Warfarin - Pharmacist Dosing Inpatient   Does not apply q1800    Abtx:  Anti-infectives    Start     Dose/Rate Route Frequency Ordered Stop   05/11/15 2200  levofloxacin (LEVAQUIN) IVPB 750 mg     750 mg 100 mL/hr over 90 Minutes Intravenous Daily at bedtime 05/11/15 0950     05/11/15 0800  vancomycin (VANCOCIN) IVPB 750 mg/150 ml premix     750 mg 150 mL/hr over 60 Minutes Intravenous Every 12 hours 05/11/15 0624     05/11/15 0215  levofloxacin (LEVAQUIN) IVPB 500 mg  Status:  Discontinued     500 mg 100 mL/hr over 60 Minutes Intravenous Daily at bedtime 05/11/15 0201 05/11/15 0950   05/11/15 0000  clindamycin (CLEOCIN) IVPB 300 mg  Status:  Discontinued     300 mg 100 mL/hr over 30 Minutes Intravenous 4 times per day 05/10/15 2122 05/11/15 0151   05/10/15 2130  vancomycin (VANCOCIN) IVPB 1000 mg/200 mL premix     1,000 mg 200 mL/hr over 60 Minutes Intravenous  Once 05/10/15 2122 05/11/15 0053      Total days of antibiotics: 0 vanco/levaquin          Social History:  reports that he has been smoking Cigarettes and Cigars.  He started smoking about 2 years ago. He has a 15 pack-year smoking history. He has never used smokeless tobacco. He reports that he does not drink alcohol or use illicit drugs.  Family History  Problem Relation Age of Onset  . Stroke Father 70  . Hypertension Father   . Diabetes Other     . Diabetes Mother   . Cancer Sister   . Diabetes Sister   . Cancer Brother   . Diabetes Brother   . Heart disease Brother     General ROS: see HPI, 12 point ROS o/w normal  Blood pressure 123/66, pulse 70, temperature 97.3 F (36.3 C), temperature source Oral, resp. rate 17,  height '5\' 11"'$  (1.803 m), weight 108.8 kg (239 lb 13.8 oz), SpO2 98 %. General appearance: alert, cooperative, no distress and ashen Eyes: negative findings: conjunctivae and sclerae normal and pupils equal, round, reactive to light and accomodation Throat: normal findings: oropharynx pink & moist without lesions or evidence of thrush Neck: no adenopathy and supple, symmetrical, trachea midline Lungs: clear to auscultation bilaterally Heart: irregularly irregular rhythm Abdomen: normal findings: bowel sounds normal and soft, non-tender Extremities: R LE is grossly swollen below the knee, multiple pustules, weeping serous fluid, knee is fixed at 90 degrees.    Results for orders placed or performed during the hospital encounter of 05/10/15 (from the past 48 hour(s))  CBC with Differential     Status: Abnormal   Collection Time: 05/10/15  7:21 PM  Result Value Ref Range   WBC 7.2 4.0 - 10.5 K/uL   RBC 4.52 4.22 - 5.81 MIL/uL   Hemoglobin 9.9 (L) 13.0 - 17.0 g/dL   HCT 31.9 (L) 39.0 - 52.0 %   MCV 70.6 (L) 78.0 - 100.0 fL   MCH 21.9 (L) 26.0 - 34.0 pg   MCHC 31.0 30.0 - 36.0 g/dL   RDW 22.9 (H) 11.5 - 15.5 %   Platelets 128 (L) 150 - 400 K/uL   Neutrophils Relative % 58 %   Lymphocytes Relative 22 %   Monocytes Relative 18 %   Eosinophils Relative 2 %   Basophils Relative 0 %   Neutro Abs 4.2 1.7 - 7.7 K/uL   Lymphs Abs 1.6 0.7 - 4.0 K/uL   Monocytes Absolute 1.3 (H) 0.1 - 1.0 K/uL   Eosinophils Absolute 0.1 0.0 - 0.7 K/uL   Basophils Absolute 0.0 0.0 - 0.1 K/uL   RBC Morphology TARGET CELLS   Comprehensive metabolic panel     Status: Abnormal   Collection Time: 05/10/15  7:21 PM  Result Value Ref  Range   Sodium 131 (L) 135 - 145 mmol/L   Potassium 4.5 3.5 - 5.1 mmol/L   Chloride 96 (L) 101 - 111 mmol/L   CO2 24 22 - 32 mmol/L   Glucose, Bld 114 (H) 65 - 99 mg/dL   BUN 43 (H) 6 - 20 mg/dL   Creatinine, Ser 1.16 0.61 - 1.24 mg/dL   Calcium 8.9 8.9 - 10.3 mg/dL   Total Protein 7.3 6.5 - 8.1 g/dL   Albumin 2.8 (L) 3.5 - 5.0 g/dL   AST 33 15 - 41 U/L   ALT 19 17 - 63 U/L   Alkaline Phosphatase 172 (H) 38 - 126 U/L   Total Bilirubin 4.0 (H) 0.3 - 1.2 mg/dL   GFR calc non Af Amer 59 (L) >60 mL/min   GFR calc Af Amer >60 >60 mL/min    Comment: (NOTE) The eGFR has been calculated using the CKD EPI equation. This calculation has not been validated in all clinical situations. eGFR's persistently <60 mL/min signify possible Chronic Kidney Disease.    Anion gap 11 5 - 15  I-Stat CG4 Lactic Acid, ED     Status: Abnormal   Collection Time: 05/10/15  7:32 PM  Result Value Ref Range   Lactic Acid, Venous 3.72 (HH) 0.5 - 2.0 mmol/L   Comment NOTIFIED PHYSICIAN   Urinalysis, Routine w reflex microscopic     Status: Abnormal   Collection Time: 05/10/15  9:00 PM  Result Value Ref Range   Color, Urine ORANGE (A) YELLOW    Comment: BIOCHEMICALS MAY BE AFFECTED BY COLOR  APPearance CLOUDY (A) CLEAR   Specific Gravity, Urine 1.020 1.005 - 1.030   pH 5.5 5.0 - 8.0   Glucose, UA NEGATIVE NEGATIVE mg/dL   Hgb urine dipstick NEGATIVE NEGATIVE   Bilirubin Urine SMALL (A) NEGATIVE   Ketones, ur NEGATIVE NEGATIVE mg/dL   Protein, ur 30 (A) NEGATIVE mg/dL   Nitrite NEGATIVE NEGATIVE   Leukocytes, UA TRACE (A) NEGATIVE  Urine microscopic-add on     Status: Abnormal   Collection Time: 05/10/15  9:00 PM  Result Value Ref Range   Squamous Epithelial / LPF 0-5 (A) NONE SEEN   WBC, UA 0-5 0 - 5 WBC/hpf   RBC / HPF NONE SEEN 0 - 5 RBC/hpf   Bacteria, UA FEW (A) NONE SEEN   Urine-Other AMORPHOUS URATES/PHOSPHATES     Comment: SPERM PRESENT  Ammonia     Status: None   Collection Time: 05/10/15   9:28 PM  Result Value Ref Range   Ammonia 25 9 - 35 umol/L  Protime-INR     Status: Abnormal   Collection Time: 05/10/15  9:28 PM  Result Value Ref Range   Prothrombin Time 32.3 (H) 11.6 - 15.2 seconds   INR 3.22 (H) 0.00 - 1.49  Amylase     Status: Abnormal   Collection Time: 05/10/15  9:28 PM  Result Value Ref Range   Amylase 110 (H) 28 - 100 U/L  I-Stat CG4 Lactic Acid, ED     Status: Abnormal   Collection Time: 05/10/15 11:44 PM  Result Value Ref Range   Lactic Acid, Venous 3.19 (HH) 0.5 - 2.0 mmol/L   Comment NOTIFIED PHYSICIAN   Glucose, capillary     Status: None   Collection Time: 05/11/15  2:04 AM  Result Value Ref Range   Glucose-Capillary 91 65 - 99 mg/dL   Comment 1 Notify RN    Comment 2 Document in Chart   Comprehensive metabolic panel     Status: Abnormal   Collection Time: 05/11/15  4:03 AM  Result Value Ref Range   Sodium 134 (L) 135 - 145 mmol/L   Potassium 4.0 3.5 - 5.1 mmol/L   Chloride 99 (L) 101 - 111 mmol/L   CO2 26 22 - 32 mmol/L   Glucose, Bld 157 (H) 65 - 99 mg/dL   BUN 42 (H) 6 - 20 mg/dL   Creatinine, Ser 1.16 0.61 - 1.24 mg/dL   Calcium 8.6 (L) 8.9 - 10.3 mg/dL   Total Protein 6.3 (L) 6.5 - 8.1 g/dL   Albumin 2.4 (L) 3.5 - 5.0 g/dL   AST 27 15 - 41 U/L   ALT 16 (L) 17 - 63 U/L   Alkaline Phosphatase 143 (H) 38 - 126 U/L   Total Bilirubin 3.8 (H) 0.3 - 1.2 mg/dL   GFR calc non Af Amer 59 (L) >60 mL/min   GFR calc Af Amer >60 >60 mL/min    Comment: (NOTE) The eGFR has been calculated using the CKD EPI equation. This calculation has not been validated in all clinical situations. eGFR's persistently <60 mL/min signify possible Chronic Kidney Disease.    Anion gap 9 5 - 15  Protime-INR     Status: Abnormal   Collection Time: 05/11/15  4:03 AM  Result Value Ref Range   Prothrombin Time 33.6 (H) 11.6 - 15.2 seconds   INR 3.40 (H) 0.00 - 1.49  Glucose, capillary     Status: Abnormal   Collection Time: 05/11/15  7:42 AM  Result Value Ref  Range   Glucose-Capillary 108 (H) 65 - 99 mg/dL  Glucose, capillary     Status: Abnormal   Collection Time: 05/11/15 11:27 AM  Result Value Ref Range   Glucose-Capillary 110 (H) 65 - 99 mg/dL      Component Value Date/Time   SDES SYNOVIAL KNEE 07/10/2012 1050   SDES SYNOVIAL KNEE 07/10/2012 1050   SPECREQUEST NONE 07/10/2012 1050   SPECREQUEST NONE 07/10/2012 1050   CULT  07/10/2012 1050    RARE PSEUDOMONAS AERUGINOSA Note: CRITICAL RESULT CALLED TO, READ BACK BY AND VERIFIED WITH: CHRISTY Nadel 07/12/12 @ 12:53PM BY RUSCA.   REPTSTATUS 07/13/2012 FINAL 07/10/2012 1050   REPTSTATUS 07/10/2012 FINAL 07/10/2012 1050   Dg Chest 2 View  05/10/2015  CLINICAL DATA:  Shortness of breath EXAM: CHEST  2 VIEW COMPARISON:  07/14/2012 chest radiograph. FINDINGS: Sternotomy wires appear aligned and intact. CABG clips overlie the mediastinum. Stable cardiomediastinal silhouette with mild cardiomegaly. No pneumothorax. Trace bilateral pleural effusions. Mild pulmonary edema. No acute consolidative airspace disease. IMPRESSION: 1. Mild congestive heart failure. 2. Trace bilateral pleural effusions. Electronically Signed   By: Delbert Phenix M.D.   On: 05/10/2015 19:20   Dg Tibia/fibula Right  05/10/2015  CLINICAL DATA:  Swelling, erythema and weeping wounds. Diabetes mellitus. Clinical concern for osteomyelitis. EXAM: RIGHT TIBIA AND FIBULA - 2 VIEW COMPARISON:  None. FINDINGS: There is diffuse soft tissue swelling. Surgical clips are noted in the medial soft tissues. Vascular calcifications are seen throughout the soft tissues. Patient is status post right total knee arthroplasty. There is prominent periprosthetic lucency at the posterior margin of the right distal femoral prosthesis, which is new since 05/02/2003. No appreciable periprosthetic lucency at the proximal right tibial prosthesis. There is suggestion of erosive change at the posterior upper right patella. There is prominent prepatellar soft tissue  swelling. Probable small right knee joint effusion. No additional cortical erosions are seen in the right tibia or right fibula. No fracture or suspicious focal osseous lesion. 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 IMPRESSION: 1. New periprosthetic lucency surrounding the right distal femoral prosthesis. New erosive change at the posterior upper right patella. Probable small right knee joint effusion. Prominent prepatellar soft tissue swelling. These findings suggest septic arthritis / prosthetic infection in the right knee. 2. No additional sites of potential bone infection in the right tibia/fibula at radiography. Electronically Signed   By: Delbert Phenix M.D.   On: 05/10/2015 19:26   Dg Foot 2 Views Right  05/10/2015  CLINICAL DATA:  Diabetes mellitus. Right lower extremity swelling, erythema and wounds. EXAM: RIGHT FOOT - 2 VIEW COMPARISON:  None. FINDINGS: Severe diffuse soft tissue swelling. Vascular calcifications throughout the soft tissues. No appreciable soft tissue gas. No definite cortical erosions or periosteal reaction. Severe osteoarthritis at the first metatarsal-phalangeal joint. Mild osteoarthritis in the right ankle joint and tarsometatarsal joints. No fracture, dislocation or suspicious focal osseous lesion. Small Achilles and plantar right calcaneal spurs. IMPRESSION: Severe diffuse soft tissue swelling. No specific radiographic findings of osteomyelitis in the right foot. Electronically Signed   By: Delbert Phenix M.D.   On: 05/10/2015 19:28   US Abdomen Limited Ruq  05/10/2015  CLINICAL DATA:  Jaundice. Elevated bilirubin and alkaline phosphatase. EXAM: US ABDOMEN LIMITED - RIGHT UPPER QUADRANT COMPARISON:  CT, 06/24/2013 FINDINGS: Gallbladder: Multiple gallstones, largest measuring 1.7 cm lying in the gallbladder neck. No wall thickening. Wall measures 2.8 mm in greatest thickness. No pericholecystic fluid. No sonographic Murphy's sign. Common bile duct: Diameter:  4.4 mm Liver:  Coarsened echotexture. Mildly nodular contour. No mass or focal lesion. IMPRESSION: 1. Cholelithiasis without evidence of acute cholecystitis. 2. No bile duct dilation. 3. Mildly coarsened echotexture of the liver with subtle surface nodularity. Consider cirrhosis in the proper clinical setting. Electronically Signed   By: Lajean Manes M.D.   On: 05/10/2015 22:56   No results found for this or any previous visit (from the past 240 hour(s)).    05/11/2015, 3:37 PM     LOS: 0 days    Records and images were personally reviewed where available.

## 2015-05-11 NOTE — Progress Notes (Signed)
Pharmacy Antibiotic Note  Derek Frye is a 75 y.o. male admitted on 05/10/2015 with cellulitis/wound infection.  Pharmacy has been consulted for levofloxacin, vancomycin dosing.  Plan: Vancomycin 750mg  IV every q12 hours.  Goal trough 15-20 mcg/mL.  Levofloxacin 500mg  iv q24hr  Height: 5\' 11"  (180.3 cm) Weight: 239 lb 13.8 oz (108.8 kg) (stated last time weighed was 230 lbs) IBW/kg (Calculated) : 75.3  Temp (24hrs), Avg:98 F (36.7 C), Min:97.9 F (36.6 C), Max:98.2 F (36.8 C)   Recent Labs Lab 05/10/15 1921 05/10/15 1932 05/10/15 2344 05/11/15 0403  WBC 7.2  --   --   --   CREATININE 1.16  --   --  1.16  LATICACIDVEN  --  3.72* 3.19*  --     Estimated Creatinine Clearance: 68 mL/min (by C-G formula based on Cr of 1.16).    Allergies  Allergen Reactions  . Cefepime     ? tremors  . Amoxicillin     REACTION: swelling and a rash  . Morphine Nausea And Vomiting    Antimicrobials this admission: levofloxacin  >>  Vancomycin  >>   Thank you for allowing pharmacy to be a part of this patient's care.  Nani Skillern Crowford 05/11/2015 6:32 AM

## 2015-05-11 NOTE — H&P (Signed)
[facility]                                                                                    Patient Demographics  Derek Frye, is a 75 y.o. male  CSN: [id number]  MRN: [id number]  DOB - 10-14-1940  Admit Date - 05/10/2015  Outpatient Primary MD for the patient is Woody Seller, MD   With History of -  Past Medical History  Diagnosis Date  . HTN (hypertension)   . Obesity   . History of cholecystectomy   . CAD (coronary artery disease)     Status post CABG followed by redo in 1994 / nuclear 2007, old infarct, no ischemia  . Ejection fraction < 50%     EF  45%...echo 2004 /  EF 45%, echo, August, 2012  . Hyperlipidemia   . Cigarette smoker     He is now smoking cigars and needs to quit.  . 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 no replacement until leg is stable  . Prostate cancer [facility])     History of prostate cancer with a seed implant and this is stable.  . Abdominal aortic aneurysm (HCC)     is stable, being followed  . Atrial fibrillation ([place])     Rate control / going in and out of atrial fib, Coumadin  . Volume overload   . Right bundle branch block     noted November 09, 2008.  . Warfarin anticoagulation     Atrial fibrillation  . Carotid artery disease ([place])     Doppler September 2 [id number], [id number] LICA / Doppler October, 2010, 0-39% R. ICA [id number] LICA  . Diabetes mellitus   . Myocardial infarction ([place]) 1984  . Ulcer   . Peripheral vascular disease ([place])   . 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 Heart murmur   . Arthritis   . Tremor     ? caused via by antibiotic currently taking  . Bradycardia       Past Surgical History  Procedure Laterality Date  . Total knee arthroplasty    . Coronary artery bypass graft  1994    x2  . Anal fissure repair    . Pr vein bypass graft,aorto-fem-pop    . Leg amputation above knee  Oct. 23, 2012    Left Leg AKA  . Joint  replacement      bil knee  . Abdominal aortic endovascular stent graft N/A 04/30/2012    Procedure:  Ultrasound guided,   inserstion of  ABDOMINAL AORTIC ENDOVASCULAR STENT GRAFT;  Surgeon: [name], MD;  Location: [place];  Service: Vascular;  Laterality: N/A;  EVAR- [place]  . I&d knee with poly exchange Right 07/13/2012    Procedure: IRRIGATION AND DEBRIDEMENT KNEE WITH POLY EXCHANGE;  Surgeon: [name], MD;  Location: [facility];  Service: Orthopedics;  Laterality: Right;    in for   Chief Complaint  Patient presents with  . Wound Infection     HPI  Derek Frye  is a 75 y.o. 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 Patient has decreased appetite and his weakness has been worse lately. No fever or chills. Patient is being followed by Dr. Baxter Flattery for his antibiotics. Patient denies chest pains or shortness of breath.    Review of Systems    In addition to the HPI above,  No Fever-chills, No problems swallowing food or Liquids, No Chest pain, Cough or Shortness of Breath, No Abdominal pain, No Nausea or Vommitting, Bowel movements are regular, No Blood in stool or Urine, No dysuria, No new skin rashes or bruises, No new joints pains-aches,  No new weakness, tingling, numbness in any extremity, No recent weight gain or loss, No polyuria, polydypsia or polyphagia, No significant Mental Stressors.  A full 10 point Review of Systems was done, except as stated above, all other Review of Systems were negative.   Social History Social History  Substance Use Topics  . Smoking status: Current Every Day Smoker -- 0.50 packs/day for 30 years    Types: Cigarettes, Cigars    Start date: 07/07/2012  . Smokeless tobacco: Never Used     Comment: using E cig too  . Alcohol Use: No      Family History Family History  Problem Relation Age of Onset  . Stroke Father 24  . Hypertension Father   . Diabetes Other   . Diabetes Mother   . Cancer Sister   . Diabetes Sister   . Cancer Brother   . Diabetes Brother   . Heart disease Brother      Prior to Admission medications   Medication Sig Start Date End Date Taking? Authorizing Provider  atorvastatin (LIPITOR) 40 MG tablet Take 40 mg by mouth at bedtime.    Yes Historical Provider, MD  doxycycline (VIBRA-TABS) 100 MG tablet TAKE 1 TABLET TWICE A DAY 02/13/15  Yes Carlyle Basques, MD  furosemide (LASIX) 40 MG tablet Take 20 mg by mouth daily.    Yes Historical Provider, MD  gabapentin (NEURONTIN) 100 MG capsule Take 100 mg by mouth 3 (three) times daily as needed (pain).    Yes Historical Provider, MD  magnesium gluconate (MAGONATE) 500 MG tablet Take 500 mg by mouth daily.    Yes Historical Provider, MD  metFORMIN (GLUCOPHAGE) 850 MG tablet Take 850 mg by mouth 2 (two) times daily with a meal.    Yes Historical Provider, MD  metolazone (ZAROXOLYN) 2.5 MG tablet Take 2.5 mg by mouth daily.  05/28/12  Yes Historical Provider, MD  potassium chloride SA (K-DUR,KLOR-CON) 20 MEQ tablet Take 40 mEq by mouth every evening.    Yes Historical Provider, MD  spironolactone (ALDACTONE) 25 MG tablet Take 25 mg by mouth 2 (two) times daily. Take one (1) tablet by mouth two (2) times a day.   Yes Historical Provider, MD  traMADol (ULTRAM) 50 MG tablet Take 50 mg by mouth every 6 (six) hours as needed for moderate pain or severe pain.  11/17/14  Yes Historical Provider, MD  warfarin (COUMADIN) 2.5 MG tablet Take as directed by coumadin clinic Patient taking differently: Take 1.25 mg by mouth daily. Take as directed by coumadin clinic 04/13/15  Yes Jerline Pain, MD    Allergies  Allergen Reactions  . Cefepime     ? tremors  . Amoxicillin     REACTION: swelling and a rash  .  Morphine Nausea And Vomiting    Physical  Exam  Vitals  Blood pressure 133/76, pulse 84, temperature 97.9 F (36.6 C), temperature source Oral, resp. rate 11, SpO2 99 %.   1. General chronically ill male, looks very tired  2.  Not Suicidal or Homicidal, Awake Alert, Oriented X 3.  3. No F.N deficits, ALL C.Nerves Intact, Strength 5/5 all 4 extremities, Sensation intact all 4 extremities, Plantars down going.  4. Ears and Eyes appear Normal, Conjunctivae jaundiced, PERRLA. Moist Oral Mucosa.  5. Supple Neck, No JVD, No cervical lymphadenopathy appriciated, No Carotid Bruits.  6. Symmetrical Chest wall movement, Good air movement bilaterally, CTAB.  7. RRR, No Gallops, Rubs or Murmurs, No Parasternal Heave.  8. Positive Bowel Sounds, Abdomen Soft, Non tender, No organomegaly appriciated,No rebound -guarding or rigidity.  9.  No Cyanosis, Normal Skin Turgor, No Skin Rash or Bruise.  10. Right leg below the knee, dermatitis stasis with redness and, weeping. 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    Data Review  CBC  Recent Labs Lab 05/10/15 1921  WBC 7.2  HGB 9.9*  HCT 31.9*  PLT 128*  MCV 70.6*  MCH 21.9*  MCHC 31.0  RDW 22.9*  LYMPHSABS 1.6  MONOABS 1.3*  EOSABS 0.1  BASOSABS 0.0   ------------------------------------------------------------------------------------------------------------------  Chemistries   Recent Labs Lab 05/10/15 1921  NA 131*  K 4.5  CL 96*  CO2 24  GLUCOSE 114*  BUN 43*  CREATININE 1.16  CALCIUM 8.9  AST 33  ALT 19  ALKPHOS 172*  BILITOT 4.0*   ------------------------------------------------------------------------------------------------------------------ CrCl cannot be calculated (Unknown ideal weight.). ------------------------------------------------------------------------------------------------------------------ No results for input(s): TSH, T4TOTAL, T3FREE, THYROIDAB in the last 72 hours.  Invalid input(s): FREET3   Coagulation  profile  Recent Labs Lab 05/10/15 2128  INR 3.22*   ------------------------------------------------------------------------------------------------------------------- No results for input(s): DDIMER in the last 72 hours. -------------------------------------------------------------------------------------------------------------------  Cardiac Enzymes No results for input(s): CKMB, TROPONINI, MYOGLOBIN in the last 168 hours.  Invalid input(s): CK ------------------------------------------------------------------------------------------------------------------ Invalid input(s): POCBNP   ---------------------------------------------------------------------------------------------------------------  Urinalysis    Component Value Date/Time   COLORURINE ORANGE* 05/10/2015 2100   APPEARANCEUR CLOUDY* 05/10/2015 2100   LABSPEC 1.020 05/10/2015 2100   PHURINE 5.5 05/10/2015 2100   GLUCOSEU NEGATIVE 05/10/2015 2100   HGBUR NEGATIVE 05/10/2015 2100   BILIRUBINUR SMALL* 05/10/2015 2100   KETONESUR NEGATIVE 05/10/2015 2100   PROTEINUR 30* 05/10/2015 2100   UROBILINOGEN 1.0 07/07/2012 1818   NITRITE NEGATIVE 05/10/2015 2100   LEUKOCYTESUR TRACE* 05/10/2015 2100    ----------------------------------------------------------------------------------------------------------------   Imaging results:   Dg Chest 2 View  05/10/2015  CLINICAL DATA:  Shortness of breath EXAM: CHEST  2 VIEW COMPARISON:  07/14/2012 chest radiograph. FINDINGS: Sternotomy wires appear aligned and intact. CABG clips overlie the mediastinum. Stable cardiomediastinal silhouette with mild cardiomegaly. No pneumothorax. Trace bilateral pleural effusions. Mild pulmonary edema. No acute consolidative airspace disease. IMPRESSION: 1. Mild congestive heart failure. 2. Trace bilateral pleural effusions. Electronically Signed   By: Ilona Sorrel M.D.   On: 05/10/2015 19:20   Dg Tibia/fibula Right  05/10/2015  CLINICAL DATA:   Swelling, erythema and weeping wounds. Diabetes mellitus. Clinical concern for osteomyelitis. EXAM: RIGHT TIBIA AND FIBULA - 2 VIEW COMPARISON:  None. FINDINGS: There is diffuse soft tissue swelling. Surgical clips are noted in the medial soft tissues. Vascular calcifications are seen throughout the soft tissues. Patient is status post right total knee arthroplasty. There is prominent periprosthetic lucency at the posterior margin of the right distal  femoral prosthesis, which is new since 05/02/2003. No appreciable periprosthetic lucency at the proximal right tibial prosthesis. There is suggestion of erosive change at the posterior upper right patella. There is prominent prepatellar soft tissue swelling. Probable small right knee joint effusion. No additional cortical erosions are seen in the right tibia or right fibula. No fracture or suspicious focal osseous lesion. 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 IMPRESSION: 1. New periprosthetic lucency surrounding the right distal femoral prosthesis. New erosive change at the posterior upper right patella. Probable small right knee joint effusion. Prominent prepatellar soft tissue swelling. These findings suggest septic arthritis / prosthetic infection in the right knee. 2. No additional sites of potential bone infection in the right tibia/fibula at radiography. Electronically Signed   By: Ilona Sorrel M.D.   On: 05/10/2015 19:26   Dg Foot 2 Views Right  05/10/2015  CLINICAL DATA:  Diabetes mellitus. Right lower extremity swelling, erythema and wounds. EXAM: RIGHT FOOT - 2 VIEW COMPARISON:  None. FINDINGS: Severe diffuse soft tissue swelling. Vascular calcifications throughout the soft tissues. No appreciable soft tissue gas. No definite cortical erosions or periosteal reaction. Severe osteoarthritis at the first metatarsal-phalangeal joint. Mild osteoarthritis in the right ankle joint and tarsometatarsal joints. No fracture, dislocation or suspicious focal  osseous lesion. Small Achilles and plantar right calcaneal spurs. IMPRESSION: Severe diffuse soft tissue swelling. No specific radiographic findings of osteomyelitis in the right foot. Electronically Signed   By: Ilona Sorrel M.D.   On: 05/10/2015 19:28   US Abdomen Limited Ruq  05/10/2015  CLINICAL DATA:  Jaundice. Elevated bilirubin and alkaline phosphatase. EXAM: US ABDOMEN LIMITED - RIGHT UPPER QUADRANT COMPARISON:  CT, 06/24/2013 FINDINGS: Gallbladder: Multiple gallstones, largest measuring 1.7 cm lying in the gallbladder neck. No wall thickening. Wall measures 2.8 mm in greatest thickness. No pericholecystic fluid. No sonographic Murphy's sign. Common bile duct: Diameter: 4.4 mm Liver: Coarsened echotexture. Mildly nodular contour. No mass or focal lesion. IMPRESSION: 1. Cholelithiasis without evidence of acute cholecystitis. 2. No bile duct dilation. 3. Mildly coarsened echotexture of the liver with subtle surface nodularity. Consider cirrhosis in the proper clinical setting. Electronically Signed   By: Lajean Manes M.D.   On: 05/10/2015 22:56    Assessment & Plan  1. Right leg below the knee cellulitis, dermatitis and abscesses/with elevated lactic acid     Start on vancomycin and Levaquin (patient allergic to amoxicillin and cefepime)     Not sure if this leg can be saved     Consult ID (Dr. Baxter Flattery) and orthopedics in a.m.     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 Cholestasis, probably medication induced     Normal liver ultrasound     Check CMP in a.m. 3. Atrial fibrillation     Continue with Coumadin 4. History of coronary artery disease status post CABG 2 5. Diabetes mellitus     By mouth Glucophage and put on hold, patient is not eating enough and family reports episodes of hypoglycemia    DVT Prophylaxis Coumadin  AM Labs Ordered, also please review Full Orders  Family Communication: Admission, patients condition and plan of  care including tests being ordered have been discussed with the patient and wife who indicate understanding and agree with the plan and Code Status.  Code Status full  Disposition Plan: Home with home health  Time spent in minutes : 36 minutes  Condition GUARDED   @SIGNATURE @

## 2015-05-12 ENCOUNTER — Inpatient Hospital Stay (HOSPITAL_COMMUNITY): Payer: Medicare Other

## 2015-05-12 LAB — GLUCOSE, CAPILLARY
GLUCOSE-CAPILLARY: 147 mg/dL — AB (ref 65–99)
GLUCOSE-CAPILLARY: 155 mg/dL — AB (ref 65–99)
GLUCOSE-CAPILLARY: 167 mg/dL — AB (ref 65–99)
Glucose-Capillary: 89 mg/dL (ref 65–99)

## 2015-05-12 LAB — PROTIME-INR
INR: 3.78 — ABNORMAL HIGH (ref 0.00–1.49)
Prothrombin Time: 36.5 seconds — ABNORMAL HIGH (ref 11.6–15.2)

## 2015-05-12 LAB — VANCOMYCIN, TROUGH: VANCOMYCIN TR: 17 ug/mL (ref 10.0–20.0)

## 2015-05-12 MED ORDER — GADOBENATE DIMEGLUMINE 529 MG/ML IV SOLN
20.0000 mL | Freq: Once | INTRAVENOUS | Status: AC | PRN
  Administered 2015-05-12: 20 mL via INTRAVENOUS

## 2015-05-12 NOTE — Progress Notes (Signed)
[facility] PROGRESS NOTE  Derek Frye [id number] DOB: April 10, 1940 DOA: 05/10/2015 PCP: Woody Seller, MD  Assessment/Plan: 1. Right lower extremity cellulitis -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 -He presents with worsening right lower extremity erythema, pain, swelling. -Infectious disease consulted. -Based on previous cultures from 2014 that grew pseudomonas aeruginosa, Dr. Johnnye Frye recommended changing to Primaxin, and continuing vancomycin. -X-rays performed on admission showing new periprosthetic lucency surrounding the right distal femoral prosthesis with new erosive change at the posterior upper right patella. Findings assistant with prosthetic infection of right knee. -Above-the-knee amputation of his right lower extremity will likely be the definitive treatment -Patient expressing wishes to attempt limb salvage. Plan to treat with IV antibiotic therapy over the weekend and reassess on Monday.  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 -There was no drainable abscess noted.  2.  History of atrial fibrillation -CHADVasc score of 4 -He is chronically anticoagulated with warfarin. Pharmacy consulted for warfarin dosing. A.m. labs showing supratherapeutic INR of 3.4 with PTT of 33.6. -He is rate controlled on telemetry.  3.  History of systolic congestive heart failure -Last transthoracic echocardiogram performed in 2011 showed an EF of 45%. -Continue metolazone 2.5 mg by mouth daily and Lasix 20 mg by mouth daily  4.  Type 2 diabetes mellitus. -Continue Accu-Cheks before meals and at bedtime -Blood sugars controlled  5.   History of coronary artery disease. -Status post coronary artery bypass grafting. He does not appear to have acute cardiac issues at this time.   Code Status: Full code Family Communication:  Disposition Plan: Give IV antimicrobials over the weekend reassess on Monday   Consultants:  Orthopedic surgery  Infectious disease   Antibiotics:  IV vancomycin  IV Primaxin  HPI/Subjective: 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 Alvan Frye, had been on chronic suppression therapy with doxycycline. Previous cultures from 2014 positive for pseudomonas aeruginosa, organism resistant to ciprofloxacin, sensitive to Zosyn. He was admitted to the medicine service on 05/10/2015 presenting to the emergency department with worsening right lower extremity erythema, pain, swelling. He was started on empiric IV antibiotic therapy with vancomycin and Levaquin, changed to vancomycin and Primaxin. He was evaluated by orthopedic surgery and infectious disease.   Objective: Filed Vitals:   05/12/15 0552 05/12/15 1414  BP: 124/76 121/59  Pulse: 60 61  Temp: 98.3 F (36.8 C) 98.3 F (36.8 C)  Resp: 16 18    Intake/Output Summary (Last 24 hours) at 05/12/15 1500 Last data filed at 05/12/15 1026  Gross per 24 hour  Intake   1473 ml  Output    100 ml  Net   1373 ml   Filed Weights   05/11/15 0125  Weight: 108.8 kg (239 lb 13.8 oz)    Exam:   General:  Nontoxic-appearing, he is awake and alert  Cardiovascular: Regular rate and rhythm normal S1-S2  Respiratory: Normal respiratory efforts  Abdomen: Obese, soft nontender nondistended  Musculoskeletal: Status post left above-the-knee  amputation, right lower extremity chronic  venous stasis changes, there is erythema from his foot extending above the knee. Weeping noted, did not appreciate purulent discharge  Data Reviewed: Basic Metabolic Panel:  Recent Labs Lab 05/10/15 1921 05/11/15 0403  NA 131* 134*  K 4.5 4.0  CL 96* 99*  CO2 24 26  GLUCOSE 114* 157*  BUN 43* 42*  CREATININE 1.16 1.16  CALCIUM 8.9 8.6*   Liver Function Tests:  Recent Labs Lab 05/10/15 1921 05/11/15 0403  AST 33 27  ALT 19 16*  ALKPHOS 172* 143*  BILITOT 4.0* 3.8*  PROT 7.3 6.3*  ALBUMIN 2.8* 2.4*    Recent Labs Lab 05/10/15 2128  AMYLASE 110*    Recent Labs Lab 05/10/15 2128  AMMONIA 25   CBC:  Recent Labs Lab 05/10/15 1921  WBC 7.2  NEUTROABS 4.2  HGB 9.9*  HCT 31.9*  MCV 70.6*  PLT 128*   Cardiac Enzymes: No results for input(s): CKTOTAL, CKMB, CKMBINDEX, TROPONINI in the last 168 hours. BNP (last 3 results) No results for input(s): BNP in the last 8760 hours.  ProBNP (last 3 results) No results for input(s): PROBNP in the last 8760 hours.  CBG:  Recent Labs Lab 05/11/15 1127 05/11/15 1639 05/11/15 2343 05/12/15 0731 05/12/15 1155  GLUCAP 110* 138* 161* 89 147*    Recent Results (from the past 240 hour(s))  Blood culture (routine x 2)     Status: None (Preliminary result)   Collection Time: 05/10/15  9:28 PM  Result Value Ref Range Status   Specimen Description BLOOD RIGHT ANTECUBITAL  Final   Special Requests BOTTLES DRAWN AEROBIC AND ANAEROBIC [place]  Final   Culture   Final    NO GROWTH 1 DAY Performed at [facility]    Report Status PENDING  Incomplete  Blood culture (routine x 2)     Status: None (Preliminary result)   Collection Time: 05/10/15  9:46 PM  Result Value Ref Range Status   Specimen Description BLOOD RIGHT ANTECUBITAL  Final   Special Requests BOTTLES DRAWN AEROBIC AND ANAEROBIC 5ML  Final   Culture   Final    NO GROWTH 1 DAY Performed at [facility]     Report Status PENDING  Incomplete     Studies: Dg Chest 2 View  05/10/2015  CLINICAL DATA:  Shortness of breath EXAM: CHEST  2 VIEW COMPARISON:  07/14/2012 chest radiograph. FINDINGS: Sternotomy wires appear aligned and intact. CABG clips overlie the mediastinum. Stable cardiomediastinal silhouette with mild cardiomegaly. No pneumothorax. Trace bilateral pleural effusions. Mild pulmonary edema. No acute consolidative airspace disease. IMPRESSION: 1. Mild congestive heart failure. 2. Trace bilateral pleural effusions. Electronically Signed   By: Ilona Sorrel M.D.   On: 05/10/2015 19:20   Dg Tibia/fibula Right  05/10/2015  CLINICAL DATA:  Swelling, erythema and weeping wounds. Diabetes mellitus. Clinical concern for osteomyelitis. EXAM: RIGHT TIBIA AND FIBULA - 2 VIEW COMPARISON:  None. FINDINGS: There is diffuse soft tissue swelling. Surgical clips are noted in the medial soft tissues. Vascular calcifications are seen throughout the soft tissues. Patient is status post right total knee arthroplasty. There is prominent periprosthetic lucency at the posterior margin of the right distal femoral prosthesis, which is new since 05/02/2003. No appreciable periprosthetic lucency at the proximal right tibial prosthesis. There is suggestion of erosive change at the posterior upper right patella. There is prominent prepatellar soft tissue swelling. Probable small right knee joint effusion. No additional cortical erosions are seen in the right tibia or right fibula.  No fracture or suspicious focal osseous lesion. 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 IMPRESSION: 1. New periprosthetic lucency surrounding the right distal femoral prosthesis. New erosive change at the posterior upper right patella. Probable small right knee joint effusion. Prominent prepatellar soft tissue swelling. These findings suggest septic arthritis / prosthetic infection in the right knee. 2. No additional sites of potential bone infection  in the right tibia/fibula at radiography. Electronically Signed   By: Ilona Sorrel M.D.   On: 05/10/2015 19:26   Mr Tibia Fibula Right W Wo Contrast  05/12/2015  CLINICAL DATA:  Cellulitis and abscess of the right lower leg. EXAM: MRI OF LOWER RIGHT EXTREMITY WITHOUT AND WITH CONTRAST TECHNIQUE: Multiplanar, multisequence MR imaging of the right lower leg was performed both before and after administration of intravenous contrast. CONTRAST:  74mL MULTIHANCE GADOBENATE DIMEGLUMINE 529 MG/ML IV SOLN COMPARISON:  05/10/2015 FINDINGS: Metal artifact in the proximal tibia due to the knee prosthesis. Small focus of edema with little or no associated enhancement proximally in the tibial shaft, image 16 series 5. Distal tibial shaft intramedullary lesion with high T2 and primarily low T1 signal characteristics but some central high T1 signal observed, little change between pre and postcontrast axial images, image 16 series 8. Degenerative findings at the tibiotalar articulation. Extensive subcutaneous edema and suspected subcutaneous enhancement in the lower leg with cutaneous thickening. Edema involving the tibialis anterior muscle, gastrocnemius musculature, soleus muscle, and peroneus longus muscle, without definite muscular enhancement. No drainable abscess. Metal artifact along the lower leg likely from saphenous vein harvesting. IMPRESSION: 1. Several foci of increased T2 signal are present in the shaft of the tibia. I am doubtful that these are significantly enhancing although the lack of precontrast fat saturated T1 weighted sequences lowers sensitivity for mild degrees of enhancement. Both of these lesions are relatively nonspecific although the more proximal lesion may be a chronic incidental lesion related to the implant placement. Chronic osteomyelitis is considered unlikely but not totally excluded. The distal lesion could possibly be an enchondroma based on its signal characteristics. Both lesions are  relatively occult on conventional radiography. 2. Diffuse subcutaneous edema with some subcutaneous enhancement suggesting cellulitis. 3. Patchy muscular edema without definite enhancement. Accordingly this mild muscular edema may be stair L. 4. No drainable abscess. Electronically Signed   By: Van Clines M.D.   On: 05/12/2015 09:44   Dg Foot 2 Views Right  05/10/2015  CLINICAL DATA:  Diabetes mellitus. Right lower extremity swelling, erythema and wounds. EXAM: RIGHT FOOT - 2 VIEW COMPARISON:  None. FINDINGS: Severe diffuse soft tissue swelling. Vascular calcifications throughout the soft tissues. No appreciable soft tissue gas. No definite cortical erosions or periosteal reaction. Severe osteoarthritis at the first metatarsal-phalangeal joint. Mild osteoarthritis in the right ankle joint and tarsometatarsal joints. No fracture, dislocation or suspicious focal osseous lesion. Small Achilles and plantar right calcaneal spurs. IMPRESSION: Severe diffuse soft tissue swelling. No specific radiographic findings of osteomyelitis in the right foot. Electronically Signed   By: Ilona Sorrel M.D.   On: 05/10/2015 19:28   US Abdomen Limited Ruq  05/10/2015  CLINICAL DATA:  Jaundice. Elevated bilirubin and alkaline phosphatase. EXAM: US ABDOMEN LIMITED - RIGHT UPPER QUADRANT COMPARISON:  CT, 06/24/2013 FINDINGS: Gallbladder: Multiple gallstones, largest measuring 1.7 cm lying in the gallbladder neck. No wall thickening. Wall measures 2.8 mm in greatest thickness. No pericholecystic fluid. No sonographic Murphy's sign. Common bile duct: Diameter: 4.4 mm Liver: Coarsened echotexture. Mildly nodular contour. No mass or focal  lesion. IMPRESSION: 1. Cholelithiasis without evidence of acute cholecystitis. 2. No bile duct dilation. 3. Mildly coarsened echotexture of the liver with subtle surface nodularity. Consider cirrhosis in the proper clinical setting. Electronically Signed   By: Lajean Manes M.D.   On: 05/10/2015  22:56    Scheduled Meds: . feeding supplement (ENSURE ENLIVE)  237 mL Oral BID BM  . furosemide  20 mg Oral Daily  . imipenem-cilastatin  500 mg Intravenous Q8H  . insulin aspart  0-9 Units Subcutaneous TID WC  . magnesium gluconate  500 mg Oral Daily  . metolazone  2.5 mg Oral Daily  . multivitamin with minerals  1 tablet Oral Daily  . potassium chloride SA  40 mEq Oral QPM  . sodium chloride flush  3 mL Intravenous Q12H  . vancomycin  750 mg Intravenous Q12H  . Warfarin - Pharmacist Dosing Inpatient   Does not apply q1800   Continuous Infusions:   Active Problems:   Cellulitis and abscess of leg    Time spent: 25 min    Kelvin Cellar  [facility] Pager [phone]. If 7PM-7AM, please contact night-coverage at [email or web address], password [facility] 05/12/2015, 3:00 PM  LOS: 1 day

## 2015-05-12 NOTE — Progress Notes (Signed)
ANTICOAGULATION CONSULT NOTE - Follow Up Consult  Pharmacy Consult for Warfarin Indication: atrial fibrillation  Allergies  Allergen Reactions  . Cefepime     ? tremors  . Amoxicillin     REACTION: swelling and a rash  . Morphine Nausea And Vomiting    Patient Measurements: Height: 5\' 11"  (180.3 cm) Weight: 239 lb 13.8 oz (108.8 kg) (stated last time weighed was 230 lbs) IBW/kg (Calculated) : 75.3  Vital Signs: Temp: 98.3 F (36.8 C) (03/03 0552) Temp Source: Oral (03/03 0552) BP: 124/76 mmHg (03/03 0552) Pulse Rate: 60 (03/03 0552)  Labs:  Recent Labs  05/10/15 1921 05/10/15 2128 05/11/15 0403 05/12/15 0419  HGB 9.9*  --   --   --   HCT 31.9*  --   --   --   PLT 128*  --   --   --   LABPROT  --  32.3* 33.6* 36.5*  INR  --  3.22* 3.40* 3.78*  CREATININE 1.16  --  1.16  --     Estimated Creatinine Clearance: 68 mL/min (by C-G formula based on Cr of 1.16).   Assessment: 89 yoM on warfarin for chronic atrial fibrillation.  INR supratherapeutic on admission.  PTA warfarin dose reported as 1.25 mg daily.  LD 3/1 at 1530.  Today, 05/12/2015: - INR 3.78, increasing.  Last dose 3/1.  Pt received Levaquin x 1 dose on 3/2 which could increase INR. - No CBC this morning.  Will repeat tomorrow AM. - No bleeding/complications reported.  No other significant drug interactions noted, except that broad spectrum antibiotics could potentially have an effect.  Goal of Therapy:  INR 2-3   Plan:  - No warfarin tonight. - Daily INR.  CBC in AM. - If AKA is a possibility next week, consider holding warfarin and adding bridging agent once INR trends down?  Derek Frye 05/12/2015,12:55 PM

## 2015-05-12 NOTE — Progress Notes (Signed)
Patient's spouse wants patient's cardiologist consulted please.

## 2015-05-12 NOTE — Progress Notes (Signed)
Pharmacy Antibiotic Note  Derek Frye is a 75 y.o. male admitted on 05/10/2015 with cellulitis and PJI.  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  MRI shoed "chronic OM is considered unlikely but not excluded", cellulitis, no drainable abscess.  Pharmacy following for Vancomycin dosing.  Plan is to continue IV abx through weekend and have ortho re-evaluate on Monday.  Plan: Obtain Vancomycin trough level tonight. Goal trough 15-20 mcg/mL.  Primaxin 500 mg IV q8h dose appropriate for weight and CrCl~54 (normalized).  Height: 5\' 11"  (180.3 cm) Weight: 239 lb 13.8 oz (108.8 kg) (stated last time weighed was 230 lbs) IBW/kg (Calculated) : 75.3  Temp (24hrs), Avg:98 F (36.7 C), Min:97.3 F (36.3 C), Max:98.4 F (36.9 C)   Recent Labs Lab 05/10/15 1921 05/10/15 1932 05/10/15 2344 05/11/15 0403  WBC 7.2  --   --   --   CREATININE 1.16  --   --  1.16  LATICACIDVEN  --  3.72* 3.19*  --     Estimated Creatinine Clearance: 68 mL/min (by C-G formula based on Cr of 1.16).    Allergies  Allergen Reactions  . Cefepime     ? tremors  . Amoxicillin     REACTION: swelling and a rash  . Morphine Nausea And Vomiting    Antimicrobials this admission: 3/2 >> Levofloxacin >> 3/2 3/1 >> Vancomycin >> 3/2 >> Primaxin >>  Dose adjustments this admission: 3/3 2000 VT: ____ on 750 mg IV q12h  Microbiology results: 3/1 BCx: ngtd x 1 day  Thank you for allowing pharmacy to be a part of this patient's care.  [name] 05/12/2015 12:50 PM

## 2015-05-12 NOTE — Progress Notes (Signed)
Pharmacy Consult Note - Vancomycin Follow up  Labs: vanc trough 17  A/P: Vanc trough therapeutic (goal 15-20 mcg/mL). Continue current dose of 750mg  IV q12  Adrian Saran, PharmD, BCPS Pager [phone] 05/12/2015 8:37 PM

## 2015-05-13 LAB — GLUCOSE, CAPILLARY
GLUCOSE-CAPILLARY: 112 mg/dL — AB (ref 65–99)
Glucose-Capillary: 100 mg/dL — ABNORMAL HIGH (ref 65–99)
Glucose-Capillary: 146 mg/dL — ABNORMAL HIGH (ref 65–99)
Glucose-Capillary: 121 mg/dL — ABNORMAL HIGH (ref 65–99)
Glucose-Capillary: 155 mg/dL — ABNORMAL HIGH (ref 65–99)

## 2015-05-13 LAB — CBC
HCT: 28.5 % — ABNORMAL LOW (ref 39.0–52.0)
Hemoglobin: 8.9 g/dL — ABNORMAL LOW (ref 13.0–17.0)
MCH: 23.3 pg — AB (ref 26.0–34.0)
MCHC: 31.2 g/dL (ref 30.0–36.0)
MCV: 74.6 fL — AB (ref 78.0–100.0)
PLATELETS: 132 10*3/uL — AB (ref 150–400)
RBC: 3.82 MIL/uL — AB (ref 4.22–5.81)
RDW: 23.9 % — AB (ref 11.5–15.5)
WBC: 8.6 10*3/uL (ref 4.0–10.5)

## 2015-05-13 LAB — PROTIME-INR
INR: 2.87 — ABNORMAL HIGH (ref 0.00–1.49)
PROTHROMBIN TIME: 29.6 s — AB (ref 11.6–15.2)

## 2015-05-13 MED ORDER — DIPHENHYDRAMINE HCL 25 MG PO CAPS: 25.0000 mg | ORAL_CAPSULE | Freq: Once | ORAL | Status: DC

## 2015-05-13 MED ORDER — ATORVASTATIN CALCIUM 40 MG PO TABS
40.0000 mg | ORAL_TABLET | Freq: Every day | ORAL | Status: DC
  Administered 2015-05-13 – 2015-05-18 (×6): 40 mg via ORAL
  Filled 2015-05-13 (×7): qty 1

## 2015-05-13 NOTE — Progress Notes (Signed)
Subjective: Better   Objective: Vital signs in last 24 hours: Temp:  [97.6 F (36.4 C)-98.3 F (36.8 C)] 97.8 F (36.6 C) (03/04 0445) Pulse Rate:  [61-65] 65 (03/04 0445) Resp:  [16-20] 16 (03/04 0445) BP: (107-123)/(59-68) 123/65 mmHg (03/04 0445) SpO2:  [97 %-98 %] 97 % (03/04 0445)  Intake/Output from previous day: 03/03 0701 - 03/04 0700 In: 793 [P.O.:440; I.V.:3; IV Piggyback:350] Out: 500 [Urine:500] Intake/Output this shift:     Recent Labs  05/10/15 1921 05/13/15 0511  HGB 9.9* 8.9*    Recent Labs  05/10/15 1921 05/13/15 0511  WBC 7.2 8.6  RBC 4.52 3.82*  HCT 31.9* 28.5*  PLT 128* 132*    Recent Labs  05/10/15 1921 05/11/15 0403  NA 131* 134*  K 4.5 4.0  CL 96* 99*  CO2 24 26  BUN 43* 42*  CREATININE 1.16 1.16  GLUCOSE 114* 157*  CALCIUM 8.9 8.6*    Recent Labs  05/12/15 0419 05/13/15 0511  INR 3.78* 2.87*    Neurologically intact Compartment soft Less swelling and drainage.  Assessment/Plan: Continue dressing changes and AB per ID. Dr. Alvan Dame to address on Monday   Cherl Gorney C 05/13/2015, 8:58 AM

## 2015-05-13 NOTE — Progress Notes (Signed)
[facility] PROGRESS NOTE  Derek Frye [id number] DOB: [birth year] DOA: 05/10/2015 PCP: Woody Seller, MD  Assessment/Plan: 1. 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 -He presents with worsening right lower extremity erythema, pain, swelling. -Infectious disease consulted. -Based on previous cultures from 2014 that grew pseudomonas aeruginosa, Dr. Johnnye Sima recommended changing to Primaxin, and continuing vancomycin. -X-rays performed on admission showing new periprosthetic lucency surrounding the right distal femoral prosthesis with new erosive change at the posterior upper right patella. Findings assistant with prosthetic infection of right knee. -Above-the-knee amputation of his right lower extremity will likely be the definitive treatment -Patient expressing wishes to attempt limb salvage. Plan to treat with IV antibiotic therapy over the weekend and reassess on Monday.  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 -There was no drainable abscess noted. -Await further recs from Dr Alvan Dame on Monday  2.  History of atrial fibrillation -CHADVasc score of 4 -He is chronically anticoagulated with warfarin. Pharmacy consulted for warfarin dosing. A.m. labs 05/13/2015 showing supratherapeutic INR of 2.87 and PT 29.6 -He is rate controlled on telemetry.   3.  History of systolic congestive heart failure -Last transthoracic echocardiogram performed in 2011 showed an EF of 45%. -Continue metolazone 2.5 mg by mouth daily and Lasix 20 mg by mouth daily  4.  Type 2 diabetes mellitus. -Continue Accu-Cheks before meals  and at bedtime -Blood sugars controlled  5.  History of coronary artery disease. -Status post coronary artery bypass grafting. He does not appear to have acute cardiac issues at this time.   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 Alvan Dame, had been on chronic suppression therapy with doxycycline. Previous cultures from 2014 positive for pseudomonas aeruginosa, organism resistant to ciprofloxacin, sensitive to Zosyn. He was admitted to the medicine service on 05/10/2015 presenting to the emergency department with worsening right lower extremity erythema, pain, swelling. He was started on empiric IV antibiotic therapy with vancomycin and Levaquin, changed to vancomycin and Primaxin. He was evaluated by orthopedic surgery and infectious disease.   Objective: Filed Vitals:   05/12/15 2150 05/13/15 0445  BP: 107/68 123/65  Pulse: 65 65  Temp: 97.6 F (36.4 C) 97.8 F (36.6 C)  Resp: 20 16    Intake/Output Summary (Last 24 hours) at 05/13/15 1355 Last data filed at 05/13/15 [id number]  Gross per 24 hour  Intake    660 ml  Output    500 ml  Net    160 ml   Filed Weights   05/11/15 0125  Weight: 108.8 kg (239 lb 13.8 oz)    Exam:   General:  Nontoxic-appearing, he is awake and alert  Cardiovascular: Regular rate and rhythm normal S1-S2  Respiratory: Normal respiratory efforts  Abdomen: Obese, soft nontender  nondistended  Musculoskeletal: Status post left above-the-knee amputation, right lower extremity chronic venous stasis changes, there is there is improvement to erythema, decrease weeping  Data Reviewed: Basic Metabolic Panel:  Recent Labs Lab 05/10/15 1921 05/11/15 0403  NA 131* 134*  K 4.5 4.0  CL 96* 99*  CO2 24 26  GLUCOSE 114* 157*  BUN 43* 42*  CREATININE 1.16 1.16  CALCIUM 8.9 8.6*   Liver Function Tests:  Recent Labs Lab 05/10/15 1921 05/11/15 0403  AST 33 27  ALT 19 16*  ALKPHOS 172* 143*  BILITOT 4.0* 3.8*  PROT 7.3 6.3*  ALBUMIN 2.8* 2.4*    Recent Labs Lab 05/10/15 2128  AMYLASE 110*    Recent Labs Lab 05/10/15 2128  AMMONIA 25   CBC:  Recent Labs Lab 05/10/15 1921 05/13/15 0511  WBC 7.2 8.6  NEUTROABS 4.2  --   HGB 9.9* 8.9*  HCT 31.9* 28.5*  MCV 70.6* 74.6*  PLT 128* 132*   Cardiac Enzymes: No results for input(s): CKTOTAL, CKMB, CKMBINDEX, TROPONINI in the last 168 hours. BNP (last 3 results) No results for input(s): BNP in the last 8760 hours.  ProBNP (last 3 results) No results for input(s): PROBNP in the last 8760 hours.  CBG:  Recent Labs Lab 05/12/15 1741 05/12/15 2221 05/13/15 0159 05/13/15 0741 05/13/15 1159  GLUCAP 155* 167* 112* 100* 146*    Recent Results (from the past 240 hour(s))  Blood culture (routine x 2)     Status: None (Preliminary result)   Collection Time: 05/10/15  9:28 PM  Result Value Ref Range Status   Specimen Description BLOOD RIGHT ANTECUBITAL  Final   Special Requests BOTTLES DRAWN AEROBIC AND ANAEROBIC [place]  Final   Culture   Final    NO GROWTH 2 DAYS Performed at [facility]    Report Status PENDING  Incomplete  Blood culture (routine x 2)     Status: None (Preliminary result)   Collection Time: 05/10/15  9:46 PM  Result Value Ref Range Status   Specimen Description BLOOD RIGHT ANTECUBITAL  Final   Special Requests BOTTLES DRAWN AEROBIC AND ANAEROBIC 5ML  Final   Culture    Final    NO GROWTH 2 DAYS Performed at [facility]    Report Status PENDING  Incomplete     Studies: Mr Tibia Fibula Right W Wo Contrast  05/12/2015  CLINICAL DATA:  Cellulitis and abscess of the right lower leg. EXAM: MRI OF LOWER RIGHT EXTREMITY WITHOUT AND WITH CONTRAST TECHNIQUE: Multiplanar, multisequence MR imaging of the right lower leg was performed both before and after administration of intravenous contrast. CONTRAST:  8mL MULTIHANCE GADOBENATE DIMEGLUMINE 529 MG/ML IV SOLN COMPARISON:  05/10/2015 FINDINGS: Metal artifact in the proximal tibia due to the knee prosthesis. Small focus of edema with little or no associated enhancement proximally in the tibial shaft, image 16 series 5. Distal tibial shaft intramedullary lesion with high T2 and primarily low T1 signal characteristics but some central high T1 signal observed, little change between pre and postcontrast axial images, image 16 series 8. Degenerative findings at the tibiotalar articulation. Extensive subcutaneous edema and suspected subcutaneous enhancement in the lower leg with cutaneous thickening. Edema involving the tibialis anterior muscle, gastrocnemius musculature, soleus muscle, and peroneus longus muscle, without definite muscular enhancement. No drainable abscess. Metal artifact along the lower leg likely from saphenous vein harvesting. IMPRESSION: 1. Several foci of increased T2 signal are present in the shaft of the tibia. I am doubtful that these  are significantly enhancing although the lack of precontrast fat saturated T1 weighted sequences lowers sensitivity for mild degrees of enhancement. Both of these lesions are relatively nonspecific although the more proximal lesion may be a chronic incidental lesion related to the implant placement. Chronic osteomyelitis is considered unlikely but not totally excluded. The distal lesion could possibly be an enchondroma based on its signal characteristics. Both lesions are  relatively occult on conventional radiography. 2. Diffuse subcutaneous edema with some subcutaneous enhancement suggesting cellulitis. 3. Patchy muscular edema without definite enhancement. Accordingly this mild muscular edema may be stair L. 4. No drainable abscess. Electronically Signed   By: Van Clines M.D.   On: 05/12/2015 09:44    Scheduled Meds: . diphenhydrAMINE  25 mg Oral Once  . feeding supplement (ENSURE ENLIVE)  237 mL Oral BID BM  . furosemide  20 mg Oral Daily  . imipenem-cilastatin  500 mg Intravenous Q8H  . insulin aspart  0-9 Units Subcutaneous TID WC  . magnesium gluconate  500 mg Oral Daily  . metolazone  2.5 mg Oral Daily  . multivitamin with minerals  1 tablet Oral Daily  . potassium chloride SA  40 mEq Oral QPM  . sodium chloride flush  3 mL Intravenous Q12H  . vancomycin  750 mg Intravenous Q12H  . Warfarin - Pharmacist Dosing Inpatient   Does not apply q1800   Continuous Infusions:   Active Problems:   HTN (hypertension)   Atrial fibrillation (HCC)   Warfarin anticoagulation   Status post above knee amputation of left lower extremity ([place])   Cellulitis and abscess of leg    Time spent: 25 min    Kelvin Cellar  [facility] Pager [phone]. If 7PM-7AM, please contact night-coverage at [email or web address], password [facility] 05/13/2015, 1:55 PM  LOS: 2 days

## 2015-05-13 NOTE — Progress Notes (Signed)
Upon entering patient's room, he appears ashen; this was not the case this AM. VSS stable: 130/58, 98% RA, 66, 98.8, 16 RR. No complaints of pain, discomfort, chest pain or shortness of breath. Dr. Coralyn Pear notified.

## 2015-05-13 NOTE — Progress Notes (Signed)
ANTICOAGULATION CONSULT NOTE - Follow Up Consult  Pharmacy Consult for Warfarin Indication: atrial fibrillation  Allergies  Allergen Reactions  . Cefepime     ? tremors  . Amoxicillin     REACTION: swelling and a rash  . Morphine Nausea And Vomiting   Patient Measurements: Height: 5\' 11"  (180.3 cm) Weight: 239 lb 13.8 oz (108.8 kg) (stated last time weighed was 230 lbs) IBW/kg (Calculated) : 75.3  Vital Signs: Temp: 97.8 F (36.6 C) (03/04 0445) Temp Source: Oral (03/04 0445) BP: 123/65 mmHg (03/04 0445) Pulse Rate: 65 (03/04 0445)  Labs:  Recent Labs  05/10/15 1921  05/11/15 0403 05/12/15 0419 05/13/15 0511  HGB 9.9*  --   --   --  8.9*  HCT 31.9*  --   --   --  28.5*  PLT 128*  --   --   --  132*  LABPROT  --   < > 33.6* 36.5* 29.6*  INR  --   < > 3.40* 3.78* 2.87*  CREATININE 1.16  --  1.16  --   --   < > = values in this interval not displayed.  Estimated Creatinine Clearance: 68 mL/min (by C-G formula based on Cr of 1.16).  Assessment: 4 yoM on warfarin for chronic atrial fibrillation.  INR supratherapeutic on admission.  PTA warfarin dose reported as 1.25 mg daily.  LD 3/1 at 1530.  Pt received Levaquin x 1 dose on 3/2 which could increase INR  Today, 05/13/2015: - INR decreased into therapeutic range, 2.87 - Will continue to Hold Warfarin  - No bleeding/complications reported.  No other significant drug interactions noted, except that broad spectrum antibiotics could potentially have an effect.  Goal of Therapy:  INR 2-3   Plan:  - Continue to hold Warfarin - Daily INR.   - AKA is a possibility next week, consider adding bridging agent once INR trends down.  Minda Ditto PharmD Pager [phone] 05/13/2015, 1:04 PM

## 2015-05-14 LAB — GLUCOSE, CAPILLARY
GLUCOSE-CAPILLARY: 134 mg/dL — AB (ref 65–99)
GLUCOSE-CAPILLARY: 124 mg/dL — AB (ref 65–99)
Glucose-Capillary: 102 mg/dL — ABNORMAL HIGH (ref 65–99)
Glucose-Capillary: 167 mg/dL — ABNORMAL HIGH (ref 65–99)

## 2015-05-14 LAB — PROTIME-INR
INR: 2.43 — AB (ref 0.00–1.49)
Prothrombin Time: 26.1 seconds — ABNORMAL HIGH (ref 11.6–15.2)

## 2015-05-14 NOTE — Progress Notes (Signed)
ANTICOAGULATION CONSULT NOTE - Follow Up Consult  Pharmacy Consult for Warfarin Indication: atrial fibrillation  Allergies  Allergen Reactions  . Cefepime     ? tremors  . Amoxicillin     REACTION: swelling and a rash  . Morphine Nausea And Vomiting   Patient Measurements: Height: 5\' 11"  (180.3 cm) Weight: 239 lb 13.8 oz (108.8 kg) (stated last time weighed was 230 lbs) IBW/kg (Calculated) : 75.3  Vital Signs: Temp: 98.3 F (36.8 C) (03/05 0541) Temp Source: Oral (03/05 0541) BP: 135/84 mmHg (03/05 0541) Pulse Rate: 67 (03/05 0541)  Labs:  Recent Labs  05/12/15 0419 05/13/15 0511 05/14/15 0504  HGB  --  8.9*  --   HCT  --  28.5*  --   PLT  --  132*  --   LABPROT 36.5* 29.6* 26.1*  INR 3.78* 2.87* 2.43*    Estimated Creatinine Clearance: 68 mL/min (by C-G formula based on Cr of 1.16).  Assessment: 21 yoM on warfarin for chronic atrial fibrillation.  INR supra-therapeutic on admission.  PTA warfarin dose reported as 1.25 mg daily.  LD 3/1 at 1530.  Pt received Levaquin x 1 dose on 3/2 which could increase INR. Currently on Vancomycin & Primaxin  Today, 05/14/2015: - INR continues in therapeutic range, 2.43 (Last Warfarin 3/1) - Will continue to Hold Warfarin  - No bleeding/complications reported.  No other significant drug interactions noted, except that broad spectrum antibiotics can potentially increase INR  Goal of Therapy:  INR 2-3   Plan:  - Continue to hold Warfarin - Daily INR.   - Antibiotics for limb salvage vs AKA next week, recommend adding bridging agent (such as Lovenox) once INR trends down below 2  Minda Ditto PharmD Pager [phone] 05/14/2015, 8:10 AM

## 2015-05-14 NOTE — Progress Notes (Signed)
[facility] PROGRESS NOTE  Derek Frye [id number] DOB: 07-Jan-1941 DOA: 05/10/2015 PCP: Woody Seller, MD  Assessment/Plan: 1. 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 -He presents with worsening right lower extremity erythema, pain, swelling. -Infectious disease consulted. -Based on previous cultures from 2014 that grew pseudomonas aeruginosa, Dr. Johnnye Sima recommended changing to Primaxin, and continuing vancomycin. -X-rays performed on admission showing new periprosthetic lucency surrounding the right distal femoral prosthesis with new erosive change at the posterior upper right patella. Findings assistant with prosthetic infection of right knee. -Above-the-knee amputation of his right lower extremity will likely be the definitive treatment -Patient expressing wishes to attempt limb salvage. Plan to treat with IV antibiotic therapy over the weekend and reassess on Monday.  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 -There was no drainable abscess noted. -On 05/14/2015 there has been interval improvement on exam having a decrease in erythema and swelling.  -Blood cultures obtained on 05/10/2015 showing no growth to date -Await further recs from Dr Alvan Dame on Monday  2.  History of atrial fibrillation -CHADVasc score of 4 -He is chronically anticoagulated with warfarin. Pharmacy consulted for warfarin dosing -He is rate controlled on telemetry.  -Labs on 05/14/2015 showing INR of 2.4.  3.  History of systolic congestive heart failure -Last transthoracic echocardiogram performed in 2011 showed an EF of  45%. -Continue metolazone 2.5 mg by mouth daily and Lasix 20 mg by mouth daily  4.  Type 2 diabetes mellitus. -Continue Accu-Cheks before meals and at bedtime -Blood sugars controlled  5.  History of coronary artery disease. -Status post coronary artery bypass grafting. He does not appear to have acute cardiac issues at this time.   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 Alvan Dame, had been on chronic suppression therapy with doxycycline. Previous cultures from 2014 positive for pseudomonas aeruginosa, organism resistant to ciprofloxacin, sensitive to Zosyn. He was admitted to the medicine service on 05/10/2015 presenting to the emergency department with worsening right lower extremity erythema, pain, swelling. He was started on empiric IV antibiotic therapy with vancomycin and Levaquin, changed to vancomycin and Primaxin. He was evaluated by orthopedic surgery and infectious disease.   Objective: Filed Vitals:   05/13/15 2015 05/14/15 0541  BP: 122/67 135/84  Pulse: 63 67  Temp: 98.7 F (37.1 C) 98.3 F (36.8 C)  Resp: 15 16    Intake/Output Summary (Last 24 hours) at 05/14/15 1356 Last data filed at 05/14/15 1106  Gross per 24 hour  Intake   1320 ml  Output   1100 ml  Net    220 ml   Filed Weights   05/11/15 0125  Weight: 108.8 kg (239 lb 13.8 oz)    Exam:   General:  Nontoxic-appearing, he is awake and  alert  Cardiovascular: Regular rate and rhythm normal S1-S2  Respiratory: Normal respiratory efforts  Abdomen: Obese, soft nontender nondistended  Musculoskeletal: Status post left above-the-knee amputation, his right lower extremity showing improvement to erythema and swelling, minimal weeping  Data Reviewed: Basic Metabolic Panel:  Recent Labs Lab 05/10/15 1921 05/11/15 0403  NA 131* 134*  K 4.5 4.0  CL 96* 99*  CO2 24 26  GLUCOSE 114* 157*  BUN 43* 42*  CREATININE 1.16 1.16  CALCIUM 8.9 8.6*   Liver Function Tests:  Recent Labs Lab 05/10/15 1921 05/11/15 0403  AST 33 27  ALT 19 16*  ALKPHOS 172* 143*  BILITOT 4.0* 3.8*  PROT 7.3 6.3*  ALBUMIN 2.8* 2.4*    Recent Labs Lab 05/10/15 2128  AMYLASE 110*    Recent Labs Lab 05/10/15 2128  AMMONIA 25   CBC:  Recent Labs Lab 05/10/15 1921 05/13/15 0511  WBC 7.2 8.6  NEUTROABS 4.2  --   HGB 9.9* 8.9*  HCT 31.9* 28.5*  MCV 70.6* 74.6*  PLT 128* 132*   Cardiac Enzymes: No results for input(s): CKTOTAL, CKMB, CKMBINDEX, TROPONINI in the last 168 hours. BNP (last 3 results) No results for input(s): BNP in the last 8760 hours.  ProBNP (last 3 results) No results for input(s): PROBNP in the last 8760 hours.  CBG:  Recent Labs Lab 05/13/15 1159 05/13/15 1645 05/13/15 2007 05/14/15 0745 05/14/15 1120  GLUCAP 146* 121* 155* 102* 134*    Recent Results (from the past 240 hour(s))  Blood culture (routine x 2)     Status: None (Preliminary result)   Collection Time: 05/10/15  9:28 PM  Result Value Ref Range Status   Specimen Description BLOOD RIGHT ANTECUBITAL  Final   Special Requests BOTTLES DRAWN AEROBIC AND ANAEROBIC [place]  Final   Culture   Final    NO GROWTH 2 DAYS Performed at [facility]    Report Status PENDING  Incomplete  Blood culture (routine x 2)     Status: None (Preliminary result)   Collection Time: 05/10/15  9:46 PM  Result Value Ref Range Status   Specimen  Description BLOOD RIGHT ANTECUBITAL  Final   Special Requests BOTTLES DRAWN AEROBIC AND ANAEROBIC 5ML  Final   Culture   Final    NO GROWTH 2 DAYS Performed at [facility]    Report Status PENDING  Incomplete     Studies: No results found.  Scheduled Meds: . atorvastatin  40 mg Oral QHS  . diphenhydrAMINE  25 mg Oral Once  . feeding supplement (ENSURE ENLIVE)  237 mL Oral BID BM  . furosemide  20 mg Oral Daily  . imipenem-cilastatin  500 mg Intravenous Q8H  . insulin aspart  0-9 Units Subcutaneous TID WC  . magnesium gluconate  500 mg Oral Daily  . metolazone  2.5 mg Oral Daily  . multivitamin with minerals  1 tablet Oral Daily  . potassium chloride SA  40 mEq Oral QPM  . sodium chloride flush  3 mL Intravenous Q12H  . vancomycin  750 mg Intravenous Q12H  . Warfarin - Pharmacist Dosing Inpatient   Does not apply q1800   Continuous Infusions:   Active Problems:   HTN (hypertension)   Atrial fibrillation (HCC)   Warfarin anticoagulation   Status post above knee amputation of left lower extremity ([place])   Cellulitis and abscess of leg    Time spent: 15 min    Kelvin Cellar  [facility] Pager [phone].  If 7PM-7AM, please contact night-coverage at [email or web address], password [facility] 05/14/2015, 1:56 PM  LOS: 3 days

## 2015-05-15 DIAGNOSIS — T8453XA Infection and inflammatory reaction due to internal right knee prosthesis, initial encounter: Secondary | ICD-10-CM | POA: Insufficient documentation

## 2015-05-15 LAB — GLUCOSE, CAPILLARY
GLUCOSE-CAPILLARY: 104 mg/dL — AB (ref 65–99)
GLUCOSE-CAPILLARY: 122 mg/dL — AB (ref 65–99)
GLUCOSE-CAPILLARY: 128 mg/dL — AB (ref 65–99)
Glucose-Capillary: 209 mg/dL — ABNORMAL HIGH (ref 65–99)

## 2015-05-15 LAB — BASIC METABOLIC PANEL
ANION GAP: 8 (ref 5–15)
BUN: 36 mg/dL — AB (ref 6–20)
CO2: 27 mmol/L (ref 22–32)
Calcium: 8.2 mg/dL — ABNORMAL LOW (ref 8.9–10.3)
Chloride: 94 mmol/L — ABNORMAL LOW (ref 101–111)
Creatinine, Ser: 1.09 mg/dL (ref 0.61–1.24)
GFR calc Af Amer: >60 mL/min (ref >60)
Glucose, Bld: 108 mg/dL — ABNORMAL HIGH (ref 65–99)
POTASSIUM: 3.4 mmol/L — AB (ref 3.5–5.1)
Sodium: 129 mmol/L — ABNORMAL LOW (ref 135–145)

## 2015-05-15 LAB — CBC
HEMATOCRIT: 28.2 % — AB (ref 39.0–52.0)
Hemoglobin: 8.9 g/dL — ABNORMAL LOW (ref 13.0–17.0)
MCH: 23.4 pg — ABNORMAL LOW (ref 26.0–34.0)
MCHC: 31.6 g/dL (ref 30.0–36.0)
MCV: 74.2 fL — ABNORMAL LOW (ref 78.0–100.0)
Platelets: 130 10*3/uL — ABNORMAL LOW (ref 150–400)
RBC: 3.8 MIL/uL — ABNORMAL LOW (ref 4.22–5.81)
RDW: 24.1 % — AB (ref 11.5–15.5)
WBC: 7.5 10*3/uL (ref 4.0–10.5)

## 2015-05-15 LAB — PROTIME-INR
INR: 2.17 — AB (ref 0.00–1.49)
PROTHROMBIN TIME: 24 s — AB (ref 11.6–15.2)

## 2015-05-15 LAB — LACTIC ACID, PLASMA: Lactic Acid, Venous: 1.6 mmol/L (ref 0.5–2.0)

## 2015-05-15 MED ORDER — POTASSIUM CHLORIDE CRYS ER 20 MEQ PO TBCR
40.0000 meq | EXTENDED_RELEASE_TABLET | Freq: Once | ORAL | Status: AC
  Administered 2015-05-15: 40 meq via ORAL

## 2015-05-15 NOTE — Progress Notes (Signed)
INFECTIOUS DISEASE PROGRESS NOTE  ID: Derek Frye is a 75 y.o. male with  Active Problems:   HTN (hypertension)   Atrial fibrillation (HCC)   Warfarin anticoagulation   Status post above knee amputation of left lower extremity (HCC)   Cellulitis and abscess of leg  Subjective: Without complaints Leg feels better  Abtx:  Anti-infectives    Start     Dose/Rate Route Frequency Ordered Stop   05/11/15 2200  levofloxacin (LEVAQUIN) IVPB 750 mg  Status:  Discontinued     750 mg 100 mL/hr over 90 Minutes Intravenous Daily at bedtime 05/11/15 0950 05/11/15 1614   05/11/15 1800  imipenem-cilastatin (PRIMAXIN) 500 mg in sodium chloride 0.9 % 100 mL IVPB     500 mg 200 mL/hr over 30 Minutes Intravenous Every 8 hours 05/11/15 1614     05/11/15 0800  vancomycin (VANCOCIN) IVPB 750 mg/150 ml premix     750 mg 150 mL/hr over 60 Minutes Intravenous Every 12 hours 05/11/15 0624     05/11/15 0215  levofloxacin (LEVAQUIN) IVPB 500 mg  Status:  Discontinued     500 mg 100 mL/hr over 60 Minutes Intravenous Daily at bedtime 05/11/15 0201 05/11/15 0950   05/11/15 0000  clindamycin (CLEOCIN) IVPB 300 mg  Status:  Discontinued     300 mg 100 mL/hr over 30 Minutes Intravenous 4 times per day 05/10/15 2122 05/11/15 0151   05/10/15 2130  vancomycin (VANCOCIN) IVPB 1000 mg/200 mL premix     1,000 mg 200 mL/hr over 60 Minutes Intravenous  Once 05/10/15 2122 05/11/15 0053      Medications:  Scheduled: . atorvastatin  40 mg Oral QHS  . diphenhydrAMINE  25 mg Oral Once  . feeding supplement (ENSURE ENLIVE)  237 mL Oral BID BM  . furosemide  20 mg Oral Daily  . imipenem-cilastatin  500 mg Intravenous Q8H  . insulin aspart  0-9 Units Subcutaneous TID WC  . magnesium gluconate  500 mg Oral Daily  . metolazone  2.5 mg Oral Daily  . multivitamin with minerals  1 tablet Oral Daily  . potassium chloride SA  40 mEq Oral QPM  . potassium chloride  40 mEq Oral Once  . sodium chloride flush  3 mL  Intravenous Q12H  . vancomycin  750 mg Intravenous Q12H  . Warfarin - Pharmacist Dosing Inpatient   Does not apply q1800    Objective: Vital signs in last 24 hours: Temp:  [98.4 F (36.9 C)-98.6 F (37 C)] 98.4 F (36.9 C) (03/06 1411) Pulse Rate:  [59-62] 59 (03/06 1411) Resp:  [16-18] 18 (03/06 1411) BP: (115-127)/(58-65) 115/58 mmHg (03/06 1411) SpO2:  [96 %-98 %] 97 % (03/06 1411) Weight:  [112 kg (246 lb 14.6 oz)] 112 kg (246 lb 14.6 oz) (03/06 0700)   General appearance: alert, cooperative and no distress Extremities: decreased erythema and edema RLE. there are no draining lesions noted.   Lab Results  Recent Labs  05/13/15 0511 05/15/15 0407  WBC 8.6 7.5  HGB 8.9* 8.9*  HCT 28.5* 28.2*  NA  --  129*  K  --  3.4*  CL  --  94*  CO2  --  27  BUN  --  36*  CREATININE  --  1.09   Liver Panel No results for input(s): PROT, ALBUMIN, AST, ALT, ALKPHOS, BILITOT, BILIDIR, IBILI in the last 72 hours. Sedimentation Rate No results for input(s): ESRSEDRATE in the last 72 hours. C-Reactive Protein No results for input(s):  CRP in the last 72 hours.  Microbiology: Recent Results (from the past 240 hour(s))  Blood culture (routine x 2)     Status: None (Preliminary result)   Collection Time: 05/10/15  9:28 PM  Result Value Ref Range Status   Specimen Description BLOOD RIGHT ANTECUBITAL  Final   Special Requests BOTTLES DRAWN AEROBIC AND ANAEROBIC [place]  Final   Culture   Final    NO GROWTH 4 DAYS Performed at [facility]    Report Status PENDING  Incomplete  Blood culture (routine x 2)     Status: None (Preliminary result)   Collection Time: 05/10/15  9:46 PM  Result Value Ref Range Status   Specimen Description BLOOD RIGHT ANTECUBITAL  Final   Special Requests BOTTLES DRAWN AEROBIC AND ANAEROBIC 5ML  Final   Culture   Final    NO GROWTH 4 DAYS Performed at [facility]    Report Status PENDING  Incomplete    Studies/Results: No results  found.   Assessment/Plan: Prosthetic Joint Infection Cellulitis  Total days of antibiotics: 4 imipenem/vanco  No change in anbx for now Ortho to eval Wound care         [name] Infectious Diseases (pager) [phone] [email or web address] 05/15/2015, 4:28 PM  LOS: 4 days

## 2015-05-15 NOTE — Progress Notes (Signed)
[facility] PROGRESS NOTE  TIERRE HELLYER [id number] DOB: 1941-03-09 DOA: 05/10/2015 PCP: Woody Seller, MD  Assessment/Plan: 1. Right lower extremity cellulitis -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 -He presents with worsening right lower extremity erythema, pain, swelling. -Infectious disease consulted. -Based on previous cultures from 2014 that grew pseudomonas aeruginosa, Dr. Johnnye Sima recommended changing to Primaxin, and continuing vancomycin. -X-rays performed on admission showing new periprosthetic lucency surrounding the right distal femoral prosthesis with new erosive change at the posterior upper right patella. Findings assistant with prosthetic infection of right knee. -Above-the-knee amputation of his right lower extremity will likely be the definitive treatment -Patient expressing wishes to attempt limb salvage. Plan to treat with IV antibiotic therapy over the weekend and reassess on Monday.  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 -There was no drainable abscess noted. -There has been interval improvement on exam having a decrease in erythema and swelling.  -Blood cultures obtained on 05/10/2015 showing no growth to date -Await further recs from Dr Alvan Dame his orthpedic surgeon  2.  History of atrial fibrillation -CHADVasc score of 4 -He is chronically anticoagulated with warfarin. Pharmacy consulted for warfarin dosing -He is rate controlled on telemetry.  -Labs on 05/15/2015 showing INR of 2.17  3.  History of systolic congestive heart failure -Last transthoracic echocardiogram performed in 2011 showed an EF of  45%. -Continue metolazone 2.5 mg by mouth daily and Lasix 20 mg by mouth daily  4.  Type 2 diabetes mellitus. -Continue Accu-Cheks before meals and at bedtime -Blood sugars controlled  5.  History of coronary artery disease. -Status post coronary artery bypass grafting. He does not appear to have acute cardiac issues at this time.  6. 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 Alvan Dame, had been on chronic suppression therapy with doxycycline. Previous cultures from 2014 positive for pseudomonas aeruginosa, organism resistant to ciprofloxacin, sensitive to Zosyn. He was admitted to the medicine service on 05/10/2015 presenting to the emergency department with worsening right lower extremity erythema, pain, swelling. He was started on empiric IV antibiotic therapy with vancomycin and Levaquin, changed to vancomycin and Primaxin. He was evaluated by orthopedic surgery and infectious disease.   Objective: Filed Vitals:   05/15/15 0614 05/15/15 1411  BP: 118/65 115/58  Pulse:  59  Temp: 98.4 F (36.9 C) 98.4 F (36.9 C)  Resp: 16 18    Intake/Output Summary (Last 24 hours) at 05/15/15 1419 Last data filed at 05/15/15 1412  Gross per 24 hour  Intake   1200 ml  Output   1675 ml  Net   -475 ml   Filed Weights   05/11/15 0125 05/15/15 0700  Weight: 108.8 kg (239 lb 13.8 oz) 112 kg (246 lb 14.6 oz)    Exam:   General:  Nontoxic-appearing, he is awake and alert  Cardiovascular: Regular rate and rhythm normal S1-S2  Respiratory: Normal respiratory efforts  Abdomen: Obese, soft nontender nondistended  Musculoskeletal: Status post left above-the-knee amputation, his right lower extremity showing improvement to erythema and swelling, minimal weeping  Data Reviewed: Basic Metabolic Panel:  Recent Labs Lab 05/10/15 1921 05/11/15 0403 05/15/15 0407  NA 131* 134* 129*  K 4.5 4.0 3.4*  CL 96* 99* 94*  CO2 24 26 27   GLUCOSE 114* 157* 108*  BUN 43* 42* 36*  CREATININE 1.16 1.16 1.09  CALCIUM 8.9 8.6* 8.2*   Liver Function Tests:  Recent Labs Lab 05/10/15 1921 05/11/15 0403  AST 33 27  ALT 19 16*  ALKPHOS 172* 143*  BILITOT 4.0* 3.8*  PROT 7.3 6.3*  ALBUMIN 2.8* 2.4*    Recent Labs Lab 05/10/15 2128  AMYLASE 110*    Recent Labs Lab 05/10/15 2128  AMMONIA 25   CBC:  Recent Labs Lab 05/10/15 1921 05/13/15 0511 05/15/15 0407  WBC 7.2 8.6 7.5  NEUTROABS 4.2  --   --   HGB 9.9* 8.9* 8.9*  HCT 31.9* 28.5* 28.2*  MCV 70.6* 74.6* 74.2*  PLT 128* 132* 130*   Cardiac Enzymes: No results for input(s): CKTOTAL, CKMB, CKMBINDEX, TROPONINI in the last 168 hours. BNP (last 3 results) No results for input(s): BNP in the last 8760 hours.  ProBNP (last 3 results) No results for input(s): PROBNP in the last 8760 hours.  CBG:  Recent Labs Lab 05/14/15 1120 05/14/15 1714 05/14/15 2225 05/15/15 0734 05/15/15 1224  GLUCAP 134* 124* 167* 104* 122*    Recent Results (from the past 240 hour(s))  Blood culture (routine x 2)     Status: None (Preliminary result)   Collection Time: 05/10/15  9:28 PM  Result Value Ref Range Status   Specimen Description BLOOD RIGHT ANTECUBITAL  Final   Special Requests BOTTLES DRAWN AEROBIC AND ANAEROBIC [place]  Final   Culture   Final    NO GROWTH 3 DAYS Performed  at [facility]    Report Status PENDING  Incomplete  Blood culture (routine x 2)     Status: None (Preliminary result)   Collection Time: 05/10/15  9:46 PM  Result Value Ref Range Status   Specimen Description BLOOD RIGHT ANTECUBITAL  Final   Special Requests BOTTLES DRAWN AEROBIC AND ANAEROBIC 5ML  Final   Culture   Final    NO GROWTH 3 DAYS Performed at [facility]    Report Status PENDING  Incomplete     Studies: No results found.  Scheduled Meds: . atorvastatin  40 mg Oral QHS  . diphenhydrAMINE  25 mg Oral Once  . feeding supplement (ENSURE ENLIVE)  237 mL Oral BID BM  . furosemide  20 mg Oral Daily  . imipenem-cilastatin  500 mg Intravenous Q8H  . insulin aspart  0-9 Units Subcutaneous TID WC  . magnesium gluconate  500 mg Oral Daily  . metolazone  2.5 mg Oral Daily  . multivitamin with minerals  1 tablet Oral Daily  . potassium chloride SA  40 mEq Oral QPM  . sodium chloride flush  3 mL Intravenous Q12H  . vancomycin  750 mg Intravenous Q12H  . Warfarin - Pharmacist Dosing Inpatient   Does not apply q1800   Continuous Infusions:   Active Problems:   HTN (  hypertension)   Atrial fibrillation (HCC)   Warfarin anticoagulation   Status post above knee amputation of left lower extremity (HCC)   Cellulitis and abscess of leg    Time spent: 15 min    Kelvin Cellar  [facility] Pager [phone]. If 7PM-7AM, please contact night-coverage at [email or web address], password [facility] 05/15/2015, 2:19 PM  LOS: 4 days

## 2015-05-15 NOTE — Progress Notes (Signed)
ANTICOAGULATION CONSULT NOTE - Follow Up Consult  Pharmacy Consult for Warfarin; primaxin ad vancomycin Indication: atrial fibrillation; knee infection  Allergies  Allergen Reactions  . Cefepime     ? tremors  . Amoxicillin     REACTION: swelling and a rash  . Morphine Nausea And Vomiting   Patient Measurements: Height: 5\' 11"  (180.3 cm) Weight: 246 lb 14.6 oz (112 kg) IBW/kg (Calculated) : 75.3  Vital Signs: Temp: 98.4 F (36.9 C) (03/06 0614) Temp Source: Oral (03/06 [id number]) BP: 118/65 mmHg (03/06 0614)  Labs:  Recent Labs  05/13/15 0511 05/14/15 0504 05/15/15 0407  HGB 8.9*  --  8.9*  HCT 28.5*  --  28.2*  PLT 132*  --  130*  LABPROT 29.6* 26.1* 24.0*  INR 2.87* 2.43* 2.17*  CREATININE  --   --  1.09    Estimated Creatinine Clearance: 73.4 mL/min (by C-G formula based on Cr of 1.09).  Assessment: 18 yoM on warfarin for chronic atrial fibrillation.  INR supra-therapeutic on admission.  PTA warfarin dose reported as 1.25 mg daily.  LD 3/1 at 1530.  Pt received Levaquin x 1 dose on 3/2 which could increase INR. Currently on Vancomycin & Primaxin  Today, 05/15/2015: - INR continues in therapeutic range, but trending down to 2.17 (Last Warfarin 3/1) -  cbc low but stable - no bleeding documented - drug-drug intxns: broad spectrum antibiotics can potentially increase INR - eating 50-75% of meals - plan for possible AKA  Goal of Therapy:  INR 2-3   __________________  Patient is currently on vancomycin and primaxin day #6 for prosthetic knee infection.   Antimicrobials this admission: 3/2 >> Levofloxacin >> 3/2 3/1 >> Vancomycin >> 3/2 >> Primaxin >>  Dose adjustments this admission: 3/3 2000 VT: 17 on 750 mg IV q12h  Microbiology results: 3/1 BCx x2: ngtd   ____________________  Plan:  - Ortho has not been by  today to see patient yet.  Spoke to Dr. Coralyn Pear--  Continue to hold Warfarin today and to start heparin if INR<2 in case surgical procedure is  needed for patient. - Daily INR.    - continue primaxin 500 mg IV q8h - continue vancomycin 750 mg IV q12h. Will plan on re-checking level later week if still on IV abx  Dia Sitter, PharmD, BCPS 05/15/2015 1:05 PM

## 2015-05-16 ENCOUNTER — Ambulatory Visit: Payer: Medicare Other | Admitting: Family

## 2015-05-16 ENCOUNTER — Inpatient Hospital Stay (HOSPITAL_COMMUNITY)
Admission: RE | Admit: 2015-05-16 | Discharge: 2015-05-16 | Disposition: A | Discharge status: Home / Self Care | Payer: Medicare Other | Source: Ambulatory Visit | Attending: Surgery | Admitting: Surgery

## 2015-05-16 DIAGNOSIS — I10 Essential (primary) hypertension: Secondary | ICD-10-CM

## 2015-05-16 DIAGNOSIS — Z95828 Presence of other vascular implants and grafts: Secondary | ICD-10-CM

## 2015-05-16 DIAGNOSIS — E876 Hypokalemia: Secondary | ICD-10-CM

## 2015-05-16 DIAGNOSIS — D649 Anemia, unspecified: Secondary | ICD-10-CM

## 2015-05-16 DIAGNOSIS — Z7901 Long term (current) use of anticoagulants: Secondary | ICD-10-CM

## 2015-05-16 LAB — BASIC METABOLIC PANEL
ANION GAP: 9 (ref 5–15)
BUN: 35 mg/dL — ABNORMAL HIGH (ref 6–20)
CALCIUM: 8.3 mg/dL — AB (ref 8.9–10.3)
CHLORIDE: 97 mmol/L — AB (ref 101–111)
CO2: 27 mmol/L (ref 22–32)
Creatinine, Ser: 0.94 mg/dL (ref 0.61–1.24)
Glucose, Bld: 134 mg/dL — ABNORMAL HIGH (ref 65–99)
Potassium: 2.9 mmol/L — ABNORMAL LOW (ref 3.5–5.1)
SODIUM: 133 mmol/L — AB (ref 135–145)

## 2015-05-16 LAB — CULTURE, BLOOD (ROUTINE X 2)
CULTURE: NO GROWTH
Culture: NO GROWTH

## 2015-05-16 LAB — CBC
HEMATOCRIT: 28.5 % — AB (ref 39.0–52.0)
Hemoglobin: 9 g/dL — ABNORMAL LOW (ref 13.0–17.0)
MCH: 23.7 pg — ABNORMAL LOW (ref 26.0–34.0)
MCHC: 31.6 g/dL (ref 30.0–36.0)
MCV: 75 fL — ABNORMAL LOW (ref 78.0–100.0)
Platelets: 145 10*3/uL — ABNORMAL LOW (ref 150–400)
RBC: 3.8 MIL/uL — ABNORMAL LOW (ref 4.22–5.81)
RDW: 24.5 % — AB (ref 11.5–15.5)
WBC: 6.8 10*3/uL (ref 4.0–10.5)

## 2015-05-16 LAB — PROTIME-INR
INR: 1.92 — ABNORMAL HIGH (ref 0.00–1.49)
Prothrombin Time: 21.8 seconds — ABNORMAL HIGH (ref 11.6–15.2)

## 2015-05-16 LAB — GLUCOSE, CAPILLARY
GLUCOSE-CAPILLARY: 115 mg/dL — AB (ref 65–99)
Glucose-Capillary: 151 mg/dL — ABNORMAL HIGH (ref 65–99)
Glucose-Capillary: 166 mg/dL — ABNORMAL HIGH (ref 65–99)
Glucose-Capillary: 175 mg/dL — ABNORMAL HIGH (ref 65–99)

## 2015-05-16 MED ORDER — WARFARIN 1.25 MG HALF TABLET
1.2500 mg | ORAL_TABLET | Freq: Once | ORAL | Status: AC
  Administered 2015-05-16: 1.25 mg via ORAL
  Filled 2015-05-16: qty 1

## 2015-05-16 MED ORDER — POTASSIUM CHLORIDE CRYS ER 20 MEQ PO TBCR
40.0000 meq | EXTENDED_RELEASE_TABLET | Freq: Four times a day (QID) | ORAL | Status: AC
  Administered 2015-05-16 – 2015-05-17 (×3): 40 meq via ORAL
  Filled 2015-05-16 (×2): qty 2

## 2015-05-16 MED ORDER — HEPARIN (PORCINE) IN NACL 100-0.45 UNIT/ML-% IJ SOLN
1400.0000 [IU]/h | INTRAMUSCULAR | Status: DC
  Administered 2015-05-16: 1400 [IU]/h via INTRAVENOUS
  Filled 2015-05-16: qty 250

## 2015-05-16 NOTE — Progress Notes (Signed)
INFECTIOUS DISEASE PROGRESS NOTE  ID: Derek Frye is a 75 y.o. male with  Active Problems:   HTN (hypertension)   Atrial fibrillation (HCC)   Warfarin anticoagulation   Status post above knee amputation of left lower extremity (HCC)   Cellulitis and abscess of leg   Infection of prosthetic right knee joint (HCC)  Subjective: Without complaints  Abtx:  Anti-infectives    Start     Dose/Rate Route Frequency Ordered Stop   05/11/15 2200  levofloxacin (LEVAQUIN) IVPB 750 mg  Status:  Discontinued     750 mg 100 mL/hr over 90 Minutes Intravenous Daily at bedtime 05/11/15 0950 05/11/15 1614   05/11/15 1800  imipenem-cilastatin (PRIMAXIN) 500 mg in sodium chloride 0.9 % 100 mL IVPB     500 mg 200 mL/hr over 30 Minutes Intravenous Every 8 hours 05/11/15 1614     05/11/15 0800  vancomycin (VANCOCIN) IVPB 750 mg/150 ml premix     750 mg 150 mL/hr over 60 Minutes Intravenous Every 12 hours 05/11/15 0624     05/11/15 0215  levofloxacin (LEVAQUIN) IVPB 500 mg  Status:  Discontinued     500 mg 100 mL/hr over 60 Minutes Intravenous Daily at bedtime 05/11/15 0201 05/11/15 0950   05/11/15 0000  clindamycin (CLEOCIN) IVPB 300 mg  Status:  Discontinued     300 mg 100 mL/hr over 30 Minutes Intravenous 4 times per day 05/10/15 2122 05/11/15 0151   05/10/15 2130  vancomycin (VANCOCIN) IVPB 1000 mg/200 mL premix     1,000 mg 200 mL/hr over 60 Minutes Intravenous  Once 05/10/15 2122 05/11/15 0053      Medications:  Scheduled: . atorvastatin  40 mg Oral QHS  . diphenhydrAMINE  25 mg Oral Once  . feeding supplement (ENSURE ENLIVE)  237 mL Oral BID BM  . furosemide  20 mg Oral Daily  . imipenem-cilastatin  500 mg Intravenous Q8H  . insulin aspart  0-9 Units Subcutaneous TID WC  . magnesium gluconate  500 mg Oral Daily  . metolazone  2.5 mg Oral Daily  . multivitamin with minerals  1 tablet Oral Daily  . potassium chloride  40 mEq Oral Q6H  . sodium chloride flush  3 mL Intravenous  Q12H  . vancomycin  750 mg Intravenous Q12H  . warfarin  1.25 mg Oral ONCE-1800    Objective: Vital signs in last 24 hours: Temp:  [98 F (36.7 C)-98.1 F (36.7 C)] 98 F (36.7 C) (03/07 0454) Pulse Rate:  [62-68] 66 (03/07 1002) Resp:  [18] 18 (03/07 0454) BP: (114-132)/(49-62) 114/49 mmHg (03/07 1002) SpO2:  [96 %-97 %] 96 % (03/07 0454)   General appearance: alert, cooperative and no distress Extremities: decreased erythema, decreased edema. dense chronic skin changes  Lab Results  Recent Labs  05/15/15 0407 05/16/15 0355  WBC 7.5 6.8  HGB 8.9* 9.0*  HCT 28.2* 28.5*  NA 129* 133*  K 3.4* 2.9*  CL 94* 97*  CO2 27 27  BUN 36* 35*  CREATININE 1.09 0.94   Liver Panel No results for input(s): PROT, ALBUMIN, AST, ALT, ALKPHOS, BILITOT, BILIDIR, IBILI in the last 72 hours. Sedimentation Rate No results for input(s): ESRSEDRATE in the last 72 hours. C-Reactive Protein No results for input(s): CRP in the last 72 hours.  Microbiology: Recent Results (from the past 240 hour(s))  Blood culture (routine x 2)     Status: None   Collection Time: 05/10/15  9:28 PM  Result Value Ref Range Status  Specimen Description BLOOD RIGHT ANTECUBITAL  Final   Special Requests BOTTLES DRAWN AEROBIC AND ANAEROBIC [facility]  Final   Culture   Final    NO GROWTH 5 DAYS Performed at [facility]    Report Status 05/16/2015 FINAL  Final  Blood culture (routine x 2)     Status: None   Collection Time: 05/10/15  9:46 PM  Result Value Ref Range Status   Specimen Description BLOOD RIGHT ANTECUBITAL  Final   Special Requests BOTTLES DRAWN AEROBIC AND ANAEROBIC 5ML  Final   Culture   Final    NO GROWTH 5 DAYS Performed at [facility]    Report Status 05/16/2015 FINAL  Final    Studies/Results: No results found.   Assessment/Plan: Prosthetic Joint Infection Cellulitis Hypokalemia Anemia  Total days of antibiotics: 5 imipenem/vanco  No change in anbx for now Ortho  to eval (per pt they have seen him) Wound care Primary to address K and HCT         [name] Infectious Diseases (pager) [phone] [email or web address] 05/16/2015, 3:15 PM  LOS: 5 days

## 2015-05-16 NOTE — Progress Notes (Addendum)
ANTICOAGULATION CONSULT NOTE - Follow Up Consult  Pharmacy Consult for Warfarin Indication: atrial fibrillation  Allergies  Allergen Reactions  . Cefepime     ? tremors  . Amoxicillin     REACTION: swelling and a rash  . Morphine Nausea And Vomiting   Patient Measurements: Height: 5\' 11"  (180.3 cm) Weight: 246 lb 14.6 oz (112 kg) IBW/kg (Calculated) : 75.3  Heparin dosing weight: 98 kg  Vital Signs: Temp: 98 F (36.7 C) (03/07 0454) Temp Source: Oral (03/07 0454) BP: 118/49 mmHg (03/07 0454) Pulse Rate: 62 (03/07 0454)  Labs:  Recent Labs  05/14/15 0504 05/15/15 0407 05/16/15 0355  HGB  --  8.9* 9.0*  HCT  --  28.2* 28.5*  PLT  --  130* 145*  LABPROT 26.1* 24.0* 21.8*  INR 2.43* 2.17* 1.92*  CREATININE  --  1.09 0.94    Estimated Creatinine Clearance: 85.1 mL/min (by C-G formula based on Cr of 0.94).  Assessment: 59 yoM on warfarin for chronic atrial fibrillation.  INR supra-therapeutic on admission.  PTA warfarin dose reported as 1.25 mg daily.  LD 3/1 at 1530.  Pt received Levaquin x 1 dose on 3/2 which could increase INR. Currently on Vancomycin & Primaxin for knee infection with plan for possible AKA.  Warfarin has been placed on hold since admission in case procedure is needed and to bridge with heparin when INR is sub-therapeutic.  Today, 05/16/2015: - INR now sub-therapeutic at 1.92 (Last Warfarin 3/1) -  cbc low but stable - no bleeding documented - drug-drug intxns: broad spectrum antibiotics can potentially increase INR - eating 50-100% of meals - plan for possible AKA  Goal of Therapy:  INR 2-3 Heparin level 0.3-0.7 units/ml    Plan:  - Will continue to hold warfarin until a decision is made regarding AKA.  Please advise [place] if/when you want warfarin to be resumed back for patient. - Will start heparin drip now that INR is <2.  Initiate heparin drip at 1400 units/hr (no bolus).   - check 8 hour heparin level - Daily INR.     Dia Sitter, PharmD,  BCPS 05/16/2015 7:43 AM  Adden:  Dr. Coralyn Pear stated that othro will not pursue surgery for patient and pharmacy can resume warfarin back for him today.  In addition, INR is sub-therapeutic, but high enough today that bridging with heparin is not needed at this time. - d/c heparin per MD's request - warfarin 1.25 mg PO x1 today Dia Sitter, PharmD, BCPS 05/16/2015 1:57 PM

## 2015-05-16 NOTE — Progress Notes (Signed)
[facility] PROGRESS NOTE  Derek Frye [id number] DOB: March 15, 1940 DOA: 05/10/2015 PCP: Woody Seller, MD  Assessment/Plan: 1. Right lower extremity cellulitis -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 -He presents with worsening right lower extremity erythema, pain, swelling. -Infectious disease consulted. -Based on previous cultures from 2014 that grew pseudomonas aeruginosa, Dr. Johnnye Sima recommended changing to Primaxin, and continuing vancomycin. -X-rays performed on admission showing new periprosthetic lucency surrounding the right distal femoral prosthesis with new erosive change at the posterior upper right patella. Findings assistant with prosthetic infection of right knee. 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 -There was no drainable abscess noted -Patient expressing wishes to attempt limb salvage. Plan to continue IV antibiotic therapy recommended by infectious disease. -There has been interval improvement on exam having a decrease in erythema and swelling.  -Blood cultures obtained on 05/10/2015 showing no growth to date -Dr Alvan Dame of orthopedic surgery following, did not recommend surgical intervention.  2.  History of atrial fibrillation -CHADVasc score of 4 -He is chronically anticoagulated with warfarin. Pharmacy consulted for warfarin dosing -He is rate controlled on telemetry.  -Labs on 05/16/2015 showing INR of 1.92 -Pharmacy consulted for warfarin dosing  3.  History of systolic congestive heart failure -Last transthoracic echocardiogram performed in 2011 showed an EF of 45%. -Continue metolazone 2.5 mg by mouth  daily and Lasix 20 mg by mouth daily -Replacing potassium today  4.  Type 2 diabetes mellitus. -Continue Accu-Cheks before meals and at bedtime -Blood sugars controlled  5.  History of coronary artery disease. -Status post coronary artery bypass grafting. He does not appear to have acute cardiac issues at this time.  6. Hypokalemia -A.m. 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 Alvan Dame, had been on chronic suppression therapy with doxycycline. Previous cultures from 2014 positive for pseudomonas aeruginosa, organism resistant to ciprofloxacin, sensitive to Zosyn. He was admitted to the medicine service on 05/10/2015 presenting to the emergency department with worsening right lower extremity erythema, pain, swelling. He was started on empiric IV antibiotic therapy with vancomycin and Levaquin, changed to vancomycin and Primaxin. He was evaluated by orthopedic surgery and infectious disease.   Objective: Filed Vitals:   05/16/15 1002 05/16/15 1522  BP: 114/49 114/61  Pulse: 66 121  Temp:  98.2 F (36.8 C)  Resp:  17    Intake/Output Summary (Last 24 hours) at 05/16/15 1723 Last data filed at 05/16/15 0900  Gross per 24 hour  Intake   1070 ml  Output    650 ml  Net    420 ml   Filed Weights   05/11/15 0125 05/15/15 0700   Weight:  108.8 kg (239 lb 13.8 oz) 112 kg (246 lb 14.6 oz)    Exam:   General:  Nontoxic-appearing, he is awake and alert  Cardiovascular: Regular rate and rhythm normal S1-S2  Respiratory: Normal respiratory efforts  Abdomen: Obese, soft nontender nondistended  Musculoskeletal: Status post left above-the-knee amputation, his right lower extremity showing improvement to erythema and swelling, minimal weeping.   Genitourinary. Scrotal edema present  Data Reviewed: Basic Metabolic Panel:  Recent Labs Lab 05/10/15 1921 05/11/15 0403 05/15/15 0407 05/16/15 0355  NA 131* 134* 129* 133*  K 4.5 4.0 3.4* 2.9*  CL 96* 99* 94* 97*  CO2 24 26 27 27   GLUCOSE 114* 157* 108* 134*  BUN 43* 42* 36* 35*  CREATININE 1.16 1.16 1.09 0.94  CALCIUM 8.9 8.6* 8.2* 8.3*   Liver Function Tests:  Recent Labs Lab 05/10/15 1921 05/11/15 0403  AST 33 27  ALT 19 16*  ALKPHOS 172* 143*  BILITOT 4.0* 3.8*  PROT 7.3 6.3*  ALBUMIN 2.8* 2.4*    Recent Labs Lab 05/10/15 2128  AMYLASE 110*    Recent Labs Lab 05/10/15 2128  AMMONIA 25   CBC:  Recent Labs Lab 05/10/15 1921 05/13/15 0511 05/15/15 0407 05/16/15 0355  WBC 7.2 8.6 7.5 6.8  NEUTROABS 4.2  --   --   --   HGB 9.9* 8.9* 8.9* 9.0*  HCT 31.9* 28.5* 28.2* 28.5*  MCV 70.6* 74.6* 74.2* 75.0*  PLT 128* 132* 130* 145*   Cardiac Enzymes: No results for input(s): CKTOTAL, CKMB, CKMBINDEX, TROPONINI in the last 168 hours. BNP (last 3 results) No results for input(s): BNP in the last 8760 hours.  ProBNP (last 3 results) No results for input(s): PROBNP in the last 8760 hours.  CBG:  Recent Labs Lab 05/15/15 1224 05/15/15 1716 05/15/15 2201 05/16/15 0917 05/16/15 1218  GLUCAP 122* 209* 128* 115* 151*    Recent Results (from the past 240 hour(s))  Blood culture (routine x 2)     Status: None   Collection Time: 05/10/15  9:28 PM  Result Value Ref Range Status   Specimen Description BLOOD RIGHT ANTECUBITAL  Final    Special Requests BOTTLES DRAWN AEROBIC AND ANAEROBIC [place]  Final   Culture   Final    NO GROWTH 5 DAYS Performed at [facility]    Report Status 05/16/2015 FINAL  Final  Blood culture (routine x 2)     Status: None   Collection Time: 05/10/15  9:46 PM  Result Value Ref Range Status   Specimen Description BLOOD RIGHT ANTECUBITAL  Final   Special Requests BOTTLES DRAWN AEROBIC AND ANAEROBIC 5ML  Final   Culture   Final    NO GROWTH 5 DAYS Performed at [facility]    Report Status 05/16/2015 FINAL  Final     Studies: No results found.  Scheduled Meds: . atorvastatin  40 mg Oral QHS  . diphenhydrAMINE  25 mg Oral Once  . feeding supplement (ENSURE ENLIVE)  237 mL Oral BID BM  . furosemide  20 mg Oral Daily  . imipenem-cilastatin  500 mg Intravenous Q8H  . insulin aspart  0-9 Units Subcutaneous TID WC  . magnesium gluconate  500 mg Oral Daily  . metolazone  2.5 mg Oral Daily  . multivitamin with minerals  1 tablet Oral Daily  . potassium chloride  40 mEq Oral Q6H  . sodium chloride flush  3 mL Intravenous Q12H  . vancomycin  750 mg Intravenous Q12H  .  warfarin  1.25 mg Oral ONCE-1800   Continuous Infusions:   Active Problems:   HTN (hypertension)   Atrial fibrillation (HCC)   Warfarin anticoagulation   Status post above knee amputation of left lower extremity (HCC)   Cellulitis and abscess of leg   Infection of prosthetic right knee joint ([place])    Time spent: 15 min    Kelvin Cellar  [facility] Pager [phone]. If 7PM-7AM, please contact night-coverage at [email or web address], password [facility] 05/16/2015, 5:23 PM  LOS: 5 days

## 2015-05-17 ENCOUNTER — Telehealth: Payer: Self-pay | Admitting: *Deleted

## 2015-05-17 DIAGNOSIS — I482 Chronic atrial fibrillation: Secondary | ICD-10-CM

## 2015-05-17 DIAGNOSIS — L02419 Cutaneous abscess of limb, unspecified: Secondary | ICD-10-CM

## 2015-05-17 DIAGNOSIS — Z89612 Acquired absence of left leg above knee: Secondary | ICD-10-CM

## 2015-05-17 DIAGNOSIS — L03119 Cellulitis of unspecified part of limb: Secondary | ICD-10-CM

## 2015-05-17 LAB — COMPREHENSIVE METABOLIC PANEL
ALT: 19 U/L (ref 17–63)
ANION GAP: 9 (ref 5–15)
AST: 40 U/L (ref 15–41)
Albumin: 2.3 g/dL — ABNORMAL LOW (ref 3.5–5.0)
Alkaline Phosphatase: 182 U/L — ABNORMAL HIGH (ref 38–126)
BUN: 32 mg/dL — ABNORMAL HIGH (ref 6–20)
CHLORIDE: 95 mmol/L — AB (ref 101–111)
CO2: 28 mmol/L (ref 22–32)
CREATININE: 1 mg/dL (ref 0.61–1.24)
Calcium: 8.1 mg/dL — ABNORMAL LOW (ref 8.9–10.3)
Glucose, Bld: 150 mg/dL — ABNORMAL HIGH (ref 65–99)
POTASSIUM: 3.2 mmol/L — AB (ref 3.5–5.1)
SODIUM: 132 mmol/L — AB (ref 135–145)
Total Bilirubin: 4.1 mg/dL — ABNORMAL HIGH (ref 0.3–1.2)
Total Protein: 6.2 g/dL — ABNORMAL LOW (ref 6.5–8.1)

## 2015-05-17 LAB — GLUCOSE, CAPILLARY
GLUCOSE-CAPILLARY: 117 mg/dL — AB (ref 65–99)
GLUCOSE-CAPILLARY: 140 mg/dL — AB (ref 65–99)
Glucose-Capillary: 146 mg/dL — ABNORMAL HIGH (ref 65–99)

## 2015-05-17 LAB — CBC
HCT: 29.5 % — ABNORMAL LOW (ref 39.0–52.0)
Hemoglobin: 9.1 g/dL — ABNORMAL LOW (ref 13.0–17.0)
MCH: 22.8 pg — AB (ref 26.0–34.0)
MCHC: 30.8 g/dL (ref 30.0–36.0)
MCV: 73.8 fL — AB (ref 78.0–100.0)
PLATELETS: 130 10*3/uL — AB (ref 150–400)
RBC: 4 MIL/uL — ABNORMAL LOW (ref 4.22–5.81)
RDW: 24.3 % — AB (ref 11.5–15.5)
WBC: 7.4 10*3/uL (ref 4.0–10.5)

## 2015-05-17 LAB — PROTIME-INR
INR: 1.75 — ABNORMAL HIGH (ref 0.00–1.49)
PROTHROMBIN TIME: 20.4 s — AB (ref 11.6–15.2)

## 2015-05-17 MED ORDER — WARFARIN - PHARMACIST DOSING INPATIENT
Freq: Every day | Status: DC
  Administered 2015-05-19: 17:00:00

## 2015-05-17 MED ORDER — WARFARIN SODIUM 2.5 MG PO TABS
2.5000 mg | ORAL_TABLET | Freq: Once | ORAL | Status: AC
  Administered 2015-05-17: 2.5 mg via ORAL
  Filled 2015-05-17: qty 1

## 2015-05-17 MED ORDER — POTASSIUM CHLORIDE CRYS ER 20 MEQ PO TBCR
40.0000 meq | EXTENDED_RELEASE_TABLET | Freq: Four times a day (QID) | ORAL | Status: AC
  Administered 2015-05-17 (×3): 40 meq via ORAL
  Filled 2015-05-17 (×3): qty 2

## 2015-05-17 NOTE — Progress Notes (Addendum)
[facility] PROGRESS NOTE  Derek Frye [id number] DOB: 1941-01-14 DOA: 05/10/2015 PCP: Woody Seller, MD  Assessment/Plan: 1. 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 -now on Primaxin, and vancomycin. -duration and choice of Abx per ID  2.  History of atrial fibrillation -CHADVasc score of 4, on chronically anticoagulated with warfarin  3.  History of systolic congestive heart failure -Last transthoracic echocardiogram performed in 2011 showed an EF of 45%. -Continue metolazone 2.5 mg by mouth daily and Lasix 20 mg by mouth daily -Replacing potassium  4.  Type 2 diabetes mellitus. -Continue Accu-Cheks before meals and at bedtime -CBGs stable  5.  History of coronary artery disease. -Status post coronary artery bypass grafting. He does not appear to have acute cardiac issues at this time.  6. Hypokalemia -K 3.2 today, will replace with kdur 40 meq PO q 6 hours x3 doses  7. Delirium/sundowning -noted 2nights ago and last night, re-orientation, trial of haldol if recurs and fails non pharma techniques  8. Hyperbilirubinemia -etiology unclear, will repeat, last week too bili was in 4 range, could have NASH  Code Status: Full code Family Communication: I spoke to his wife who was present at bedside Disposition Plan: Await further recs from ID and Ortho   Consultants:  Orthopedic surgery  Infectious disease   Antibiotics:  IV vancomycin  IV Primaxin  HPI/Subjective: Feels ok, some confusion last pm per wife  Objective: Filed  Vitals:   05/16/15 2159 05/17/15 0441  BP: 117/55 113/68  Pulse: 98 51  Temp: 98.4 F (36.9 C) 98 F (36.7 C)  Resp: 18 16    Intake/Output Summary (Last 24 hours) at 05/17/15 1548 Last data filed at 05/16/15 2030  Gross per 24 hour  Intake     60 ml  Output      0 ml  Net     60 ml   Filed Weights   05/11/15 0125 05/15/15 0700  Weight: 108.8 kg (239 lb 13.8 oz) 112 kg (246 lb 14.6 oz)    Exam:   General:  Nontoxic-appearing, he is awake and alert, oriented to self, partly to time  Cardiovascular: Regular rate and rhythm normal S1-S2  Respiratory: Normal respiratory efforts  Abdomen: Obese, soft nontender nondistended  Musculoskeletal: Status post left above-the-knee amputation, his right lower extremity showing improvement to erythema and swelling, minimal weeping.   Genitourinary. Scrotal edema present  Data Reviewed: Basic Metabolic Panel:  Recent Labs Lab 05/10/15 1921 05/11/15 0403 05/15/15 0407 05/16/15 0355 05/17/15 0350  NA 131* 134* 129* 133* 132*  K 4.5 4.0 3.4* 2.9* 3.2*  CL 96* 99* 94* 97* 95*  CO2 24 26 27 27 28   GLUCOSE 114* 157* 108* 134* 150*  BUN 43* 42* 36* 35* 32*  CREATININE 1.16 1.16 1.09 0.94 1.00  CALCIUM 8.9 8.6* 8.2* 8.3* 8.1*   Liver Function Tests:  Recent Labs Lab 05/10/15 1921 05/11/15 0403 05/17/15 0350  AST 33 27 40  ALT 19 16* 19  ALKPHOS 172* 143* 182*  BILITOT 4.0* 3.8* 4.1*  PROT 7.3 6.3* 6.2*  ALBUMIN 2.8* 2.4* 2.3*    Recent Labs Lab 05/10/15 2128  AMYLASE 110*  Recent Labs Lab 05/10/15 2128  AMMONIA 25   CBC:  Recent Labs Lab 05/10/15 1921 05/13/15 0511 05/15/15 0407 05/16/15 0355 05/17/15 0350  WBC 7.2 8.6 7.5 6.8 7.4  NEUTROABS 4.2  --   --   --   --   HGB 9.9* 8.9* 8.9* 9.0* 9.1*  HCT 31.9* 28.5* 28.2* 28.5* 29.5*  MCV 70.6* 74.6* 74.2* 75.0* 73.8*  PLT 128* 132* 130* 145* 130*   Cardiac Enzymes: No results for input(s): CKTOTAL, CKMB, CKMBINDEX, TROPONINI in the last 168  hours. BNP (last 3 results) No results for input(s): BNP in the last 8760 hours.  ProBNP (last 3 results) No results for input(s): PROBNP in the last 8760 hours.  CBG:  Recent Labs Lab 05/16/15 1218 05/16/15 1707 05/16/15 2117 05/17/15 0718 05/17/15 1259  GLUCAP 151* 166* 175* 117* 140*    Recent Results (from the past 240 hour(s))  Blood culture (routine x 2)     Status: None   Collection Time: 05/10/15  9:28 PM  Result Value Ref Range Status   Specimen Description BLOOD RIGHT ANTECUBITAL  Final   Special Requests BOTTLES DRAWN AEROBIC AND ANAEROBIC [place]  Final   Culture   Final    NO GROWTH 5 DAYS Performed at [facility]    Report Status 05/16/2015 FINAL  Final  Blood culture (routine x 2)     Status: None   Collection Time: 05/10/15  9:46 PM  Result Value Ref Range Status   Specimen Description BLOOD RIGHT ANTECUBITAL  Final   Special Requests BOTTLES DRAWN AEROBIC AND ANAEROBIC 5ML  Final   Culture   Final    NO GROWTH 5 DAYS Performed at [facility]    Report Status 05/16/2015 FINAL  Final     Studies: No results found.  Scheduled Meds: . atorvastatin  40 mg Oral QHS  . diphenhydrAMINE  25 mg Oral Once  . feeding supplement (ENSURE ENLIVE)  237 mL Oral BID BM  . furosemide  20 mg Oral Daily  . imipenem-cilastatin  500 mg Intravenous Q8H  . insulin aspart  0-9 Units Subcutaneous TID WC  . magnesium gluconate  500 mg Oral Daily  . metolazone  2.5 mg Oral Daily  . multivitamin with minerals  1 tablet Oral Daily  . potassium chloride  40 mEq Oral Q6H  . sodium chloride flush  3 mL Intravenous Q12H  . vancomycin  750 mg Intravenous Q12H  . warfarin  2.5 mg Oral ONCE-1800  . Warfarin - Pharmacist Dosing Inpatient   Does not apply q1800   Continuous Infusions:   Active Problems:   HTN (hypertension)   Atrial fibrillation (HCC)   Warfarin anticoagulation   Status post above knee amputation of left lower extremity (HCC)   Cellulitis and  abscess of leg   Infection of prosthetic right knee joint ([place])    Time spent:35 min    [facility]  [facility] Pager [phone]. If 7PM-7AM, please contact night-coverage at [email or web address], password [facility] 05/17/2015, 3:48 PM  LOS: 6 days

## 2015-05-17 NOTE — Progress Notes (Signed)
INFECTIOUS DISEASE PROGRESS NOTE  ID: RONDALD JANOFF is a 75 y.o. male with  Active Problems:   HTN (hypertension)   Atrial fibrillation (HCC)   Warfarin anticoagulation   Status post above knee amputation of left lower extremity (HCC)   Cellulitis and abscess of leg   Infection of prosthetic right knee joint (HCC)  Subjective: Without complaints  Abtx:  Anti-infectives    Start     Dose/Rate Route Frequency Ordered Stop   05/11/15 2200  levofloxacin (LEVAQUIN) IVPB 750 mg  Status:  Discontinued     750 mg 100 mL/hr over 90 Minutes Intravenous Daily at bedtime 05/11/15 0950 05/11/15 1614   05/11/15 1800  imipenem-cilastatin (PRIMAXIN) 500 mg in sodium chloride 0.9 % 100 mL IVPB     500 mg 200 mL/hr over 30 Minutes Intravenous Every 8 hours 05/11/15 1614     05/11/15 0800  vancomycin (VANCOCIN) IVPB 750 mg/150 ml premix     750 mg 150 mL/hr over 60 Minutes Intravenous Every 12 hours 05/11/15 0624     05/11/15 0215  levofloxacin (LEVAQUIN) IVPB 500 mg  Status:  Discontinued     500 mg 100 mL/hr over 60 Minutes Intravenous Daily at bedtime 05/11/15 0201 05/11/15 0950   05/11/15 0000  clindamycin (CLEOCIN) IVPB 300 mg  Status:  Discontinued     300 mg 100 mL/hr over 30 Minutes Intravenous 4 times per day 05/10/15 2122 05/11/15 0151   05/10/15 2130  vancomycin (VANCOCIN) IVPB 1000 mg/200 mL premix     1,000 mg 200 mL/hr over 60 Minutes Intravenous  Once 05/10/15 2122 05/11/15 0053      Medications:  Scheduled: . atorvastatin  40 mg Oral QHS  . diphenhydrAMINE  25 mg Oral Once  . feeding supplement (ENSURE ENLIVE)  237 mL Oral BID BM  . furosemide  20 mg Oral Daily  . imipenem-cilastatin  500 mg Intravenous Q8H  . insulin aspart  0-9 Units Subcutaneous TID WC  . magnesium gluconate  500 mg Oral Daily  . metolazone  2.5 mg Oral Daily  . multivitamin with minerals  1 tablet Oral Daily  . potassium chloride  40 mEq Oral Q6H  . sodium chloride flush  3 mL Intravenous  Q12H  . vancomycin  750 mg Intravenous Q12H  . warfarin  2.5 mg Oral ONCE-1800  . Warfarin - Pharmacist Dosing Inpatient   Does not apply q1800    Objective: Vital signs in last 24 hours: Temp:  [98 F (36.7 C)-98.4 F (36.9 C)] 98 F (36.7 C) (03/08 0441) Pulse Rate:  [51-98] 51 (03/08 0441) Resp:  [16-18] 16 (03/08 0441) BP: (113-117)/(55-68) 113/68 mmHg (03/08 0441) SpO2:  [94 %-98 %] 94 % (03/08 0441)   General appearance: alert, cooperative and no distress Extremities: leg has decreased erythema and heat. no change in thickened skin.   No gross d/c.   Lab Results  Recent Labs  05/16/15 0355 05/17/15 0350  WBC 6.8 7.4  HGB 9.0* 9.1*  HCT 28.5* 29.5*  NA 133* 132*  K 2.9* 3.2*  CL 97* 95*  CO2 27 28  BUN 35* 32*  CREATININE 0.94 1.00   Liver Panel  Recent Labs  05/17/15 0350  PROT 6.2*  ALBUMIN 2.3*  AST 40  ALT 19  ALKPHOS 182*  BILITOT 4.1*   Sedimentation Rate No results for input(s): ESRSEDRATE in the last 72 hours. C-Reactive Protein No results for input(s): CRP in the last 72 hours.  Microbiology: Recent Results (  from the past 240 hour(s))  Blood culture (routine x 2)     Status: None   Collection Time: 05/10/15  9:28 PM  Result Value Ref Range Status   Specimen Description BLOOD RIGHT ANTECUBITAL  Final   Special Requests BOTTLES DRAWN AEROBIC AND ANAEROBIC [facility]  Final   Culture   Final    NO GROWTH 5 DAYS Performed at [facility]    Report Status 05/16/2015 FINAL  Final  Blood culture (routine x 2)     Status: None   Collection Time: 05/10/15  9:46 PM  Result Value Ref Range Status   Specimen Description BLOOD RIGHT ANTECUBITAL  Final   Special Requests BOTTLES DRAWN AEROBIC AND ANAEROBIC 5ML  Final   Culture   Final    NO GROWTH 5 DAYS Performed at [facility]    Report Status 05/16/2015 FINAL  Final    Studies/Results: No results found.   Assessment/Plan:  Prosthetic Joint  Infection Cellulitis Hypokalemia Anemia  Total days of antibiotics: 6 imipenem/vanco    Would give him 2 weeks of imipenem or merrem, whichever is easier to be dosed at home.  He has followed with Dr Baxter Flattery in [place] previously, arrange f/u with her at end of tx.        [name] Infectious Diseases (pager) [phone] [email or web address] 05/17/2015, 4:33 PM  LOS: 6 days

## 2015-05-17 NOTE — Progress Notes (Signed)
ANTICOAGULATION CONSULT NOTE - Follow Up Consult  Pharmacy Consult for Warfarin Indication: atrial fibrillation  Allergies  Allergen Reactions  . Cefepime     ? tremors  . Amoxicillin     REACTION: swelling and a rash  . Morphine Nausea And Vomiting   Patient Measurements: Height: 5\' 11"  (180.3 cm) Weight: 246 lb 14.6 oz (112 kg) IBW/kg (Calculated) : 75.3  Heparin dosing weight: 98 kg  Vital Signs: Temp: 98 F (36.7 C) (03/08 0441) Temp Source: Oral (03/08 0441) BP: 113/68 mmHg (03/08 0441) Pulse Rate: 51 (03/08 0441)  Labs:  Recent Labs  05/15/15 0407 05/16/15 0355 05/17/15 0350  HGB 8.9* 9.0* 9.1*  HCT 28.2* 28.5* 29.5*  PLT 130* 145* 130*  LABPROT 24.0* 21.8* 20.4*  INR 2.17* 1.92* 1.75*  CREATININE 1.09 0.94 1.00    Estimated Creatinine Clearance: 80 mL/min (by C-G formula based on Cr of 1).  Assessment: 39 yoM on warfarin for chronic atrial fibrillation.  Currently on Vancomycin & Primaxin for knee infection with plan for possible AKA.  Originally bridged with heparin, now resuming warfarin with no immediate plans for OR   Baseline INR elevated, did receive Levaquin x 1 on 3/2  Prior anticoagulation: warfarin 1.25 mg daily; LD 3/1  Significant events: 3/7: OR feels not appropriate for surgery, warfarin resumed w/o bridge as INR was > 1.9  Today, 05/17/2015:  CBC: Hgb and Plt both low but stable  INR subtherapeutic and continuing to drop, as expected with 5 days off warfarin.  Major drug interactions: none, but is on broad spectrum abx  No bleeding issues documented per nursing  Eating 60% of meals  Goal of Therapy: INR 2-3  Plan:  Warfarin 2.5 mg PO tonight at 18:00.  Daily INR  CBC at least q72 hr while on warfarin  Monitor for signs of bleeding or thrombosis   Reuel Boom, PharmD Pager: [phone] 05/17/2015, 12:28 PM

## 2015-05-17 NOTE — Telephone Encounter (Signed)
Husband is hospitalized, wife would like to talk witih Dr. Baxter Flattery.  Wife is on her cell phone.

## 2015-05-18 ENCOUNTER — Inpatient Hospital Stay (HOSPITAL_COMMUNITY): Payer: Medicare Other

## 2015-05-18 ENCOUNTER — Ambulatory Visit: Payer: Medicare Other | Admitting: Podiatry

## 2015-05-18 LAB — CBC
HCT: 29.4 % — ABNORMAL LOW (ref 39.0–52.0)
HEMOGLOBIN: 9.2 g/dL — AB (ref 13.0–17.0)
MCH: 23.8 pg — ABNORMAL LOW (ref 26.0–34.0)
MCHC: 31.3 g/dL (ref 30.0–36.0)
MCV: 76 fL — ABNORMAL LOW (ref 78.0–100.0)
PLATELETS: 150 10*3/uL (ref 150–400)
RBC: 3.87 MIL/uL — AB (ref 4.22–5.81)
RDW: 25.1 % — ABNORMAL HIGH (ref 11.5–15.5)
WBC: 7.1 10*3/uL (ref 4.0–10.5)

## 2015-05-18 LAB — COMPREHENSIVE METABOLIC PANEL
ALT: 19 U/L (ref 17–63)
AST: 42 U/L — AB (ref 15–41)
Albumin: 2.3 g/dL — ABNORMAL LOW (ref 3.5–5.0)
Alkaline Phosphatase: 191 U/L — ABNORMAL HIGH (ref 38–126)
Anion gap: 11 (ref 5–15)
BUN: 29 mg/dL — AB (ref 6–20)
CHLORIDE: 94 mmol/L — AB (ref 101–111)
CO2: 27 mmol/L (ref 22–32)
CREATININE: 1 mg/dL (ref 0.61–1.24)
Calcium: 8.2 mg/dL — ABNORMAL LOW (ref 8.9–10.3)
GFR calc Af Amer: >60 mL/min (ref >60)
Glucose, Bld: 132 mg/dL — ABNORMAL HIGH (ref 65–99)
Potassium: 3.4 mmol/L — ABNORMAL LOW (ref 3.5–5.1)
Sodium: 132 mmol/L — ABNORMAL LOW (ref 135–145)
Total Bilirubin: 4.2 mg/dL — ABNORMAL HIGH (ref 0.3–1.2)
Total Protein: 6.5 g/dL (ref 6.5–8.1)

## 2015-05-18 LAB — PROTIME-INR
INR: 1.69 — ABNORMAL HIGH (ref 0.00–1.49)
PROTHROMBIN TIME: 19.3 s — AB (ref 11.6–15.2)

## 2015-05-18 LAB — GLUCOSE, CAPILLARY
GLUCOSE-CAPILLARY: 127 mg/dL — AB (ref 65–99)
Glucose-Capillary: 171 mg/dL — ABNORMAL HIGH (ref 65–99)
Glucose-Capillary: 157 mg/dL — ABNORMAL HIGH (ref 65–99)
Glucose-Capillary: 109 mg/dL — ABNORMAL HIGH (ref 65–99)

## 2015-05-18 MED ORDER — FUROSEMIDE 10 MG/ML IJ SOLN
40.0000 mg | Freq: Two times a day (BID) | INTRAMUSCULAR | Status: AC
  Administered 2015-05-18 (×2): 40 mg via INTRAVENOUS
  Filled 2015-05-18 (×2): qty 4

## 2015-05-18 MED ORDER — SODIUM CHLORIDE 0.9% FLUSH: 10.0000 mL | INTRAVENOUS | Status: DC | PRN

## 2015-05-18 MED ORDER — POTASSIUM CHLORIDE CRYS ER 20 MEQ PO TBCR
40.0000 meq | EXTENDED_RELEASE_TABLET | Freq: Three times a day (TID) | ORAL | Status: AC
  Administered 2015-05-18 (×3): 40 meq via ORAL
  Filled 2015-05-18 (×3): qty 2

## 2015-05-18 MED ORDER — WARFARIN SODIUM 3 MG PO TABS
3.0000 mg | ORAL_TABLET | Freq: Once | ORAL | Status: AC
  Administered 2015-05-18: 3 mg via ORAL
  Filled 2015-05-18: qty 1

## 2015-05-18 NOTE — Progress Notes (Signed)
CSW assisting with d/c planning. Pt / spouse have chosen [facility] for SNF placement. SNF contacted and is able to admit on FRI if pt is stable for d/c. CSW will continue to follow to assist with d/c planning to SNF.  Werner Lean LCSW [phone]

## 2015-05-18 NOTE — NC FL2 (Signed)
[place] LEVEL OF CARE SCREENING TOOL     IDENTIFICATION  Patient Name: CURLEY FALLO Birthdate: May 06, 1940 Sex: male Admission Date (Current Location): 05/10/2015  [facility] and [place] Number:  Herbalist and Address:  [facility],  [place] [address] [place]      Provider Number: [id number]  Attending Physician Name and Address:  Domenic Polite, MD  Relative Name and Phone Number:       Current Level of Care: Hospital Recommended Level of Care: [place] Prior Approval Number:    Date Approved/Denied:   PASRR Number: [id number]  Discharge Plan: SNF    Current Diagnoses: Patient Active Problem List   Diagnosis Date Noted  . Infection of prosthetic right knee joint ([place])   . Cellulitis and abscess of leg 05/11/2015  . Status post above knee amputation of left lower extremity ([place]) 01/05/2015  . RBBB 01/05/2015  . Coronary artery disease due to lipid rich plaque 01/05/2015  . Tobacco abuse 01/05/2015  . H/O endovascular stent graft for abdominal aortic aneurysm 01/24/2014  . Prosthetic joint infection ([place]) 12/06/2013  . Aftercare following surgery of the circulatory system, [place] 07/05/2013  . Encounter for therapeutic drug monitoring 04/09/2013  . Bradycardia   . Unspecified constipation 07/20/2012  . Type II or unspecified type diabetes mellitus with peripheral circulatory disorders, uncontrolled(250.72) 07/17/2012  . Cellulitis and abscess of lower leg 07/07/2012  . Ejection fraction < 50%   . 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 Gram-negative infection 11/19/2010  . HTN (hypertension)   . Obesity   . CAD (coronary artery disease)   . Hyperlipidemia   . Cigarette smoker   . Atrial fibrillation ([place])   . Volume overload   . Right bundle branch block   . Warfarin anticoagulation   . Carotid artery disease ([place])   . Long term (current) use of anticoagulants 06/11/2010  .  STREPTOCOCCUS INFECTION CCE & UNS SITE GROUP B 02/28/2010  . CELLULITIS AND ABSCESS OF LEG EXCEPT FOOT 12/19/2009  . DYSLIPIDEMIA 11/08/2008  . Fluid overload 11/08/2008  . AAA 11/08/2008  . TOBACCO ABUSE, HX OF 11/08/2008    Orientation RESPIRATION BLADDER Height & Weight     Self, Situation, Place, Time  Normal Incontinent Weight: 112 kg (246 lb 14.6 oz) Height:  5\' 11"  (180.3 cm)  BEHAVIORAL SYMPTOMS/MOOD NEUROLOGICAL BOWEL NUTRITION STATUS  Other (Comment) (no behaviors)   Continent Diet (carb mod)  AMBULATORY STATUS COMMUNICATION OF NEEDS Skin   Extensive Assist Verbally Other (Comment) (Right lower ext cellulitus)                       Personal Care Assistance Level of Assistance  Bathing, Feeding, Dressing Bathing Assistance: Maximum assistance Feeding assistance: Independent Dressing Assistance: Maximum assistance     Functional Limitations Info  Sight, Hearing, Speech Sight Info: Impaired Hearing Info: Impaired Speech Info: Adequate    SPECIAL CARE FACTORS FREQUENCY  PT (By licensed PT)                    Contractures Contractures Info: Not present    Additional Factors Info  Code Status Code Status Info: DNR             Current Medications (05/18/2015):  This is the current hospital active medication list Current Facility-Administered Medications  Medication Dose Route Frequency Provider Last Rate Last Dose  . 0.9 %  sodium chloride  infusion  250 mL Intravenous PRN Merton Border, MD      . atorvastatin (LIPITOR) tablet 40 mg  40 mg Oral QHS Kelvin Cellar, MD   40 mg at 05/17/15 2100  . diphenhydrAMINE (BENADRYL) capsule 25 mg  25 mg Oral Once Kelvin Cellar, MD      . feeding supplement (ENSURE ENLIVE) (ENSURE ENLIVE) liquid 237 mL  237 mL Oral BID BM Kelvin Cellar, MD   237 mL at 05/18/15 1042  . furosemide (LASIX) injection 40 mg  40 mg Intravenous Q12H Domenic Polite, MD      . gabapentin (NEURONTIN) capsule 100 mg  100 mg Oral TID PRN  Merton Border, MD   100 mg at 05/17/15 0223  . imipenem-cilastatin (PRIMAXIN) 500 mg in sodium chloride 0.9 % 100 mL IVPB  500 mg Intravenous Q8H Campbell Riches, MD   500 mg at 05/18/15 1048  . insulin aspart (novoLOG) injection 0-9 Units  0-9 Units Subcutaneous TID WC Merton Border, MD   1 Units at 05/18/15 [phone]  . magnesium gluconate (MAGONATE) tablet 500 mg  500 mg Oral Daily Merton Border, MD   500 mg at 05/18/15 1042  . metolazone (ZAROXOLYN) tablet 2.5 mg  2.5 mg Oral Daily Merton Border, MD   2.5 mg at 05/18/15 1043  . multivitamin with minerals tablet 1 tablet  1 tablet Oral Daily Kelvin Cellar, MD   1 tablet at 05/18/15 1043  . ondansetron (ZOFRAN) tablet 4 mg  4 mg Oral Q6H PRN Merton Border, MD       Or  . ondansetron [facility]) injection 4 mg  4 mg Intravenous Q6H PRN Merton Border, MD      . potassium chloride SA (K-DUR,KLOR-CON) CR tablet 40 mEq  40 mEq Oral TID Domenic Polite, MD      . sodium chloride flush (NS) 0.9 % injection 3 mL  3 mL Intravenous Q12H Merton Border, MD   3 mL at 05/16/15 1005  . sodium chloride flush (NS) 0.9 % injection 3 mL  3 mL Intravenous PRN Merton Border, MD      . traMADol Veatrice Bourbon) tablet 50 mg  50 mg Oral Q6H PRN Merton Border, MD   50 mg at 05/16/15 2040  . Warfarin - Pharmacist Dosing Inpatient   Does not apply q1800 Polly Cobia, RPH   0  at 05/17/15 1800  . zolpidem (AMBIEN) tablet 5 mg  5 mg Oral QHS PRN Merton Border, MD         Discharge Medications: Please see discharge summary for a list of discharge medications.  Relevant Imaging Results:  Relevant Lab Results:   Additional Information ss # [id number]. Pt has a PICC line for IV med  Lorynn Moeser, Randall An, LCSW

## 2015-05-18 NOTE — Progress Notes (Signed)
Pharmacy Antibiotic Note  Derek Frye is a 75 y.o. male admitted on 05/10/2015 with cellulitis and PJI.  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  MRI showed "chronic OM is considered unlikely but not excluded", cellulitis, no drainable abscess.  Pharmacy following for Vancomycin dosing.  3/9: D7 antibiotics, plan 14 days Primaxin or Meropenem(whichever is easier to administer at home) per ID  Plan: No change Primaxin 500 mg IV q8h dose appropriate for weight and CrCl~64 (normalized). Vancomycin discontinued  Height: 5\' 11"  (180.3 cm) Weight: 246 lb 14.6 oz (112 kg) IBW/kg (Calculated) : 75.3  Temp (24hrs), Avg:97.2 F (36.2 C), Min:96.8 F (36 C), Max:97.6 F (36.4 C)   Recent Labs Lab 05/12/15 1922 05/13/15 0511 05/15/15 0407 05/15/15 1458 05/16/15 0355 05/17/15 0350 05/18/15 0542  WBC  --  8.6 7.5  --  6.8 7.4 7.1  CREATININE  --   --  1.09  --  0.94 1.00 1.00  LATICACIDVEN  --   --   --  1.6  --   --   --   VANCOTROUGH 17  --   --   --   --   --   --     Estimated Creatinine Clearance: 80 mL/min (by C-G formula based on Cr of 1).    Allergies  Allergen Reactions  . Cefepime     ? tremors  . Amoxicillin     REACTION: swelling and a rash  . Morphine Nausea And Vomiting    Antimicrobials this admission: 3/2 >> Levofloxacin >> 3/2 3/1 >> Vancomycin >> 3/9 3/2 >> Primaxin >>  Microbiology results: 3/1 BCx: ng-final  Thank you for allowing pharmacy to be a part of this patient's care.  Minda Ditto PharmD Pager [phone] 05/18/2015, 11:37 AM

## 2015-05-18 NOTE — Care Management Important Message (Signed)
Important Message  Patient Details  Name: JAQUAIL KLUESNER MRN: [id number] Date of Birth: 11-11-1940   Medicare Important Message Given:  Yes    Camillo Flaming 05/18/2015, 1:01 PMImportant Message  Patient Details  Name: JAYGER SCHMOCK MRN: [id number] Date of Birth: 1940-10-28   Medicare Important Message Given:  Yes    Camillo Flaming 05/18/2015, 1:01 PM

## 2015-05-18 NOTE — Consult Note (Signed)
WOC wound consult note Reason for Consult: Consult requested for sacrum.  Pt has generalized erythremia but no open wounds. Inner groin and abd skin folds are red and moist with scatted red macular papular rash; appearance consistent with candidiasis and intertrigo.  Partial thickness fissures in folds Dressing procedure/placement/frequency: Barrier cream to protect buttocks and antifungal powder to skin folds. Famaliy at bedside assessed skin and discussed plan of care. Please re-consult if further assistance is needed.  Thank-you,  Julien Girt MSN, [place], [place], [place], [place]

## 2015-05-18 NOTE — Progress Notes (Signed)
ANTICOAGULATION CONSULT NOTE - Follow Up Consult  Pharmacy Consult for Warfarin Indication: atrial fibrillation  Allergies  Allergen Reactions  . Cefepime     ? tremors  . Amoxicillin     REACTION: swelling and a rash  . Morphine Nausea And Vomiting   Patient Measurements: Height: 5\' 11"  (180.3 cm) Weight: 246 lb 14.6 oz (112 kg) IBW/kg (Calculated) : 75.3  Heparin dosing weight: 98 kg  Vital Signs: Temp: 97.6 F (36.4 C) (03/09 0506) Temp Source: Oral (03/09 0506) BP: 126/82 mmHg (03/09 0506) Pulse Rate: 57 (03/09 0506)  Labs:  Recent Labs  05/16/15 0355 05/17/15 0350 05/18/15 0542  HGB 9.0* 9.1* 9.2*  HCT 28.5* 29.5* 29.4*  PLT 145* 130* 150  LABPROT 21.8* 20.4* 19.3*  INR 1.92* 1.75* 1.69*  CREATININE 0.94 1.00 1.00   Estimated Creatinine Clearance: 80 mL/min (by C-G formula based on Cr of 1).  Assessment: 42 yoM on warfarin for chronic atrial fibrillation.  Currently Primaxin only for knee infection.  Originally bridged with heparin, now resuming warfarin with no plans for OR  Baseline INR elevated, did receive Levaquin x 1 on 3/2  Prior anticoagulation: warfarin 1.25 mg daily; LD 3/1  Significant events: 3/7: OR feels not appropriate for surgery, warfarin resumed w/o bridge as INR was > 1.9  Today, 05/18/2015:  CBC: Hgb and Plt both low but stable  INR subtherapeutic and continuing to drop, as expected with 5 days off warfarin.  Major drug interactions: none, but is on broad spectrum abx  No bleeding issues documented per nursing  Eating 100% of meals  Goal of Therapy: INR 2-3  Plan:  Warfarin 3 mg PO tonight at 18:00.  Daily INR  CBC at least q72 hr while on warfarin  Monitor for signs of bleeding or thrombosis  Minda Ditto PharmD Pager [phone] 05/18/2015, 11:44 AM

## 2015-05-18 NOTE — Consult Note (Signed)
Reason for Consult: Right TKR proximal to right leg cellulitis and venous stasis Referring Physician: [name], MD  Derek Frye is an 75 y.o. male.  HPI: Derek Frye is a 75 y.o. male patient of Dr. Alvan Dame who has history of L AKA for chronically infected TKA and cellulitis due to chronic venous insufficiency. Had infected R TKA s/p I&D / poly exchange on chronic PO suppression in 2014. Came to the ED 3/1with 3 weeks of worsening RLE cellulitis. Patient not septic. ID saw patient and he is on IV abx.  Past Medical History  Diagnosis Date  . HTN (hypertension)   . Obesity   . History of cholecystectomy   . CAD (coronary artery disease)     Status post CABG followed by redo in 1994 / nuclear 2007, old infarct, no ischemia  . Ejection fraction < 50%     EF  45%...echo 2004 /  EF 45%, echo, August, 2012  . Hyperlipidemia   . Cigarette smoker     He is now smoking cigars and needs to quit.  . 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 no replacement until leg is stable  . Prostate cancer [facility])     History of prostate cancer with a seed implant and this is stable.  . Abdominal aortic aneurysm (HCC)     is stable, being followed  . Atrial fibrillation ([place])     Rate control / going in and out of atrial fib, Coumadin  . Volume overload   . Right bundle branch block     noted November 09, 2008.  . Warfarin anticoagulation     Atrial fibrillation  . Carotid artery disease ([place])     Doppler September 2 [id number], [id number] LICA / Doppler October, 2010, 0-39% R. ICA [id number] LICA  . Diabetes mellitus   . Myocardial infarction ([place]) 1984  . Ulcer   . Peripheral vascular disease ([place])   . 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 Heart murmur   . Arthritis   . Tremor     ? caused via by antibiotic currently taking  . Bradycardia     Past Surgical History  Procedure Laterality Date  . Total knee arthroplasty    . Coronary  artery bypass graft  1994    x2  . Anal fissure repair    . Pr vein bypass graft,aorto-fem-pop    . Leg amputation above knee  Oct. 23, 2012    Left Leg AKA  . Joint replacement      bil knee  . Abdominal aortic endovascular stent graft N/A 04/30/2012    Procedure:  Ultrasound guided,   inserstion of  ABDOMINAL AORTIC ENDOVASCULAR STENT GRAFT;  Surgeon: [name], MD;  Location: [facility] [place];  Service: Vascular;  Laterality: N/A;  EVAR- [place]  . I&d knee with poly exchange Right 07/13/2012    Procedure: IRRIGATION AND DEBRIDEMENT KNEE WITH POLY EXCHANGE;  Surgeon: [name], MD;  Location: [facility];  Service: Orthopedics;  Laterality: Right;    Family History  Problem Relation Age of Onset  . Stroke Father 95  . Hypertension Father   . Diabetes Other   . Diabetes Mother   . Cancer Sister   . Diabetes Sister   . Cancer Brother   . Diabetes Brother   . Heart disease Brother     Social History:  reports that he has been smoking  Cigarettes and Cigars.  He started smoking about 2 years ago. He has a 15 pack-year smoking history. He has never used smokeless tobacco. He reports that he does not drink alcohol or use illicit drugs.  Allergies:  Allergies  Allergen Reactions  . Cefepime     ? tremors  . Amoxicillin     REACTION: swelling and a rash  . Morphine Nausea And Vomiting    Medications:  I have reviewed the patient's current medications. Scheduled: . atorvastatin  40 mg Oral QHS  . diphenhydrAMINE  25 mg Oral Once  . feeding supplement (ENSURE ENLIVE)  237 mL Oral BID BM  . imipenem-cilastatin  500 mg Intravenous Q8H  . insulin aspart  0-9 Units Subcutaneous TID WC  . magnesium gluconate  500 mg Oral Daily  . metolazone  2.5 mg Oral Daily  . multivitamin with minerals  1 tablet Oral Daily  . sodium chloride flush  3 mL Intravenous Q12H  . Warfarin - Pharmacist Dosing Inpatient   Does not apply q1800    Results for orders placed or performed during the hospital  encounter of 05/10/15 (from the past 24 hour(s))  Glucose, capillary     Status: Abnormal   Collection Time: 05/17/15  9:35 PM  Result Value Ref Range   Glucose-Capillary 146 (H) 65 - 99 mg/dL  Protime-INR     Status: Abnormal   Collection Time: 05/18/15  5:42 AM  Result Value Ref Range   Prothrombin Time 19.3 (H) 11.6 - 15.2 seconds   INR 1.69 (H) 0.00 - 1.49  CBC     Status: Abnormal   Collection Time: 05/18/15  5:42 AM  Result Value Ref Range   WBC 7.1 4.0 - 10.5 K/uL   RBC 3.87 (L) 4.22 - 5.81 MIL/uL   Hemoglobin 9.2 (L) 13.0 - 17.0 g/dL   HCT 29.4 (L) 39.0 - 52.0 %   MCV 76.0 (L) 78.0 - 100.0 fL   MCH 23.8 (L) 26.0 - 34.0 pg   MCHC 31.3 30.0 - 36.0 g/dL   RDW 25.1 (H) 11.5 - 15.5 %   Platelets 150 150 - 400 K/uL  Comprehensive metabolic panel     Status: Abnormal   Collection Time: 05/18/15  5:42 AM  Result Value Ref Range   Sodium 132 (L) 135 - 145 mmol/L   Potassium 3.4 (L) 3.5 - 5.1 mmol/L   Chloride 94 (L) 101 - 111 mmol/L   CO2 27 22 - 32 mmol/L   Glucose, Bld 132 (H) 65 - 99 mg/dL   BUN 29 (H) 6 - 20 mg/dL   Creatinine, Ser 1.00 0.61 - 1.24 mg/dL   Calcium 8.2 (L) 8.9 - 10.3 mg/dL   Total Protein 6.5 6.5 - 8.1 g/dL   Albumin 2.3 (L) 3.5 - 5.0 g/dL   AST 42 (H) 15 - 41 U/L   ALT 19 17 - 63 U/L   Alkaline Phosphatase 191 (H) 38 - 126 U/L   Total Bilirubin 4.2 (H) 0.3 - 1.2 mg/dL   GFR calc non Af Amer >60 >60 mL/min   GFR calc Af Amer >60 >60 mL/min   Anion gap 11 5 - 15    X-ray: CLINICAL DATA: Swelling, erythema and weeping wounds. Diabetes mellitus. Clinical concern for osteomyelitis.  EXAM: RIGHT TIBIA AND FIBULA - 2 VIEW  COMPARISON: None.  FINDINGS: There is diffuse soft tissue swelling. Surgical clips are noted in the medial soft tissues. Vascular calcifications are seen throughout the soft tissues. Patient  is status post right total knee arthroplasty. There is prominent periprosthetic lucency at the posterior margin of the right distal  femoral prosthesis, which is new since 05/02/2003. No appreciable periprosthetic lucency at the proximal right tibial prosthesis. There is suggestion of erosive change at the posterior upper right patella. There is prominent prepatellar soft tissue swelling. Probable small right knee joint effusion. No additional cortical erosions are seen in the right tibia or right fibula. No fracture or suspicious focal osseous lesion. 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  IMPRESSION: 1. New periprosthetic lucency surrounding the right distal femoral prosthesis. New erosive change at the posterior upper right patella. Probable small right knee joint effusion. Prominent prepatellar soft tissue swelling. These findings suggest septic arthritis / prosthetic infection in the right knee. 2. No additional sites of potential bone infection in the right tibia/fibula at radiography.   Electronically Signed  By: Ilona Sorrel M.D.  ROS  Blood pressure 126/82, pulse 57, temperature 97.6 F (36.4 C), temperature source Oral, resp. rate 16, height 5\' 11"  (1.803 m), weight 112 kg (246 lb 14.6 oz), SpO2 96 %.  Physical Exam  Assessment/Plan: Given overall condition of right leg I will avoid surgical intervention at all costs due to high concern for loss of limb Given improvement of cellulitis in right leg i will prefer continue treatment of cellulitis and venous stasis issues  Needs IV antibiotics followed by longer term suppression orally Will follow up in office to determine if surgery would ever be indicated  Findings and recs reviewed with family  Trelyn Vanderlinde D 05/18/2015, 1:14 PM

## 2015-05-18 NOTE — Progress Notes (Signed)
Peripherally Inserted Central Catheter/Midline Placement  The IV Nurse has discussed with the patient and/or persons authorized to consent for the patient, the purpose of this procedure and the potential benefits and risks involved with this procedure.  The benefits include less needle sticks, lab draws from the catheter and patient may be discharged home with the catheter.  Risks include, but not limited to, infection, bleeding, blood clot (thrombus formation), and puncture of an artery; nerve damage and irregular heat beat.  Alternatives to this procedure were also discussed.  PICC/Midline Placement Documentation  PICC / Midline Single Lumen 07/14/12 PICC Right Brachial (Active)     PICC / Midline Single Lumen (Active)     PICC Single Lumen 05/18/15 PICC Left Brachial 48 cm 0 cm (Active)  Indication for Insertion or Continuance of Line Home intravenous therapies (PICC only) 05/18/2015  2:00 PM  Exposed Catheter (cm) 0 cm 05/18/2015  2:00 PM  Dressing Change Due 05/25/15 05/18/2015  2:00 PM   Telephone consent by Wife    Christella Noa Albarece 05/18/2015, 2:40 PM

## 2015-05-18 NOTE — Clinical Social Work Placement (Signed)
   CLINICAL SOCIAL WORK PLACEMENT  NOTE  Date:  05/18/2015  Patient Details  Name: Derek Frye MRN: [id number] Date of Birth: Aug 01, 1940  Clinical Social Work is seeking post-discharge placement for this patient at the [place] level of care (*CSW will initial, date and re-position this form in  chart as items are completed):  Yes   Patient/family provided with [facility] Work Department's list of facilities offering this level of care within the geographic area requested by the patient (or if unable, by the patient's family).  Yes   Patient/family informed of their freedom to choose among providers that offer the needed level of care, that participate in Medicare, Medicaid or managed care program needed by the patient, have an available bed and are willing to accept the patient.  Yes   Patient/family informed of [facility]'s ownership interest in [facility] and [facility], as well as of the fact that they are under no obligation to receive care at these facilities.  PASRR submitted to EDS on       PASRR number received on       Existing PASRR number confirmed on 05/18/15     FL2 transmitted to all facilities in geographic area requested by pt/family on 05/18/15     FL2 transmitted to all facilities within larger geographic area on       Patient informed that his/her managed care company has contracts with or will negotiate with certain facilities, including the following:            Patient/family informed of bed offers received.  Patient chooses bed at       Physician recommends and patient chooses bed at      Patient to be transferred to   on  .  Patient to be transferred to facility by       Patient family notified on   of transfer.  Name of family member notified:        PHYSICIAN       Additional Comment:    _______________________________________________ Luretha Rued, [place] 05/18/2015, 12:25  PM

## 2015-05-18 NOTE — Progress Notes (Signed)
[facility] PROGRESS NOTE  Derek Frye [id number] DOB: 19-May-1940 DOA: 05/10/2015 PCP: Woody Seller, MD  Assessment/Plan: 1. 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 -place PICC  2.  History of atrial fibrillation -CHADVasc score of 4, on chronically anticoagulated with warfarin  3.  Acute on Chronic systolic congestive heart failure -Last transthoracic echocardiogram performed in 2011 showed an EF of 45%. -change Lasix to IV, volume overloaded, Continue metolazone 2.5 mg by mouth daily  -Replacing potassium  4.  Type 2 diabetes mellitus. -Continue Accu-Cheks before meals and at bedtime -CBGs stable  5.  History of coronary artery disease. -Status post coronary artery bypass grafting. He does not appear to have acute cardiac issues at this time.  6. Hypokalemia -K 3.2 today, will replace with kdur 40 meq PO q 6 hours x3 doses  7. Delirium/sundowning -noted 2nights ago and last night, re-orientation, trial of haldol if recurs and fails non pharma techniques  8. Hyperbilirubinemia -etiology unclear, Bili stable at 4 since admission , no baseline labs since 2014, no abd symptoms, AST/ALT within normal range, have PCP monitor this periodically ,  could have NASH  Code Status: Full code Family Communication: wife at bedside Disposition Plan: pending improvement in volume status, SNF    Consultants:  Orthopedic surgery  Infectious  disease   Antibiotics:  IV vancomycin  IV Primaxin  HPI/Subjective: Feels fine, no events overnight, some swelling of RUE,   Objective: Filed Vitals:   05/17/15 2009 05/18/15 0506  BP: 128/68 126/82  Pulse: 63 57  Temp: 96.8 F (36 C) 97.6 F (36.4 C)  Resp: 16 16    Intake/Output Summary (Last 24 hours) at 05/18/15 1112 Last data filed at 05/18/15 1019  Gross per 24 hour  Intake   1200 ml  Output      0 ml  Net   1200 ml   Filed Weights   05/11/15 0125 05/15/15 0700  Weight: 108.8 kg (239 lb 13.8 oz) 112 kg (246 lb 14.6 oz)    Exam:   General:  Nontoxic-appearing, he is awake and alert, oriented to self, partly to time  Cardiovascular: Regular rate and rhythm normal S1-S2  Respiratory: Normal respiratory efforts  Abdomen: Obese, soft nontender nondistended  Musculoskeletal: Status post left above-the-knee amputation, his right lower extremity showing improvement to erythema and swelling, minimal weeping. RUE with edema/erythema and weeping  Genitourinary. Scrotal edema present  Data Reviewed: Basic Metabolic Panel:  Recent Labs Lab 05/15/15 0407 05/16/15 0355 05/17/15 0350 05/18/15 0542  NA 129* 133* 132* 132*  K 3.4* 2.9* 3.2* 3.4*  CL 94* 97* 95* 94*  CO2 27 27 28 27   GLUCOSE 108* 134* 150* 132*  BUN 36* 35* 32* 29*  CREATININE 1.09 0.94 1.00 1.00  CALCIUM 8.2* 8.3* 8.1* 8.2*   Liver Function Tests:  Recent Labs Lab 05/17/15 0350 05/18/15 0542  AST 40 42*  ALT 19 19  ALKPHOS 182* 191*  BILITOT 4.1* 4.2*  PROT 6.2* 6.5  ALBUMIN 2.3* 2.3*   No results for input(s): LIPASE, AMYLASE in the last 168 hours. No results for  input(s): AMMONIA in the last 168 hours. CBC:  Recent Labs Lab 05/13/15 0511 05/15/15 0407 05/16/15 0355 05/17/15 0350 05/18/15 0542  WBC 8.6 7.5 6.8 7.4 7.1  HGB 8.9* 8.9* 9.0* 9.1* 9.2*  HCT 28.5* 28.2* 28.5* 29.5* 29.4*  MCV 74.6* 74.2* 75.0* 73.8* 76.0*  PLT 132* 130* 145* 130* 150   Cardiac  Enzymes: No results for input(s): CKTOTAL, CKMB, CKMBINDEX, TROPONINI in the last 168 hours. BNP (last 3 results) No results for input(s): BNP in the last 8760 hours.  ProBNP (last 3 results) No results for input(s): PROBNP in the last 8760 hours.  CBG:  Recent Labs Lab 05/16/15 1707 05/16/15 2117 05/17/15 0718 05/17/15 1259 05/17/15 2135  GLUCAP 166* 175* 117* 140* 146*    Recent Results (from the past 240 hour(s))  Blood culture (routine x 2)     Status: None   Collection Time: 05/10/15  9:28 PM  Result Value Ref Range Status   Specimen Description BLOOD RIGHT ANTECUBITAL  Final   Special Requests BOTTLES DRAWN AEROBIC AND ANAEROBIC [place]  Final   Culture   Final    NO GROWTH 5 DAYS Performed at [facility]    Report Status 05/16/2015 FINAL  Final  Blood culture (routine x 2)     Status: None   Collection Time: 05/10/15  9:46 PM  Result Value Ref Range Status   Specimen Description BLOOD RIGHT ANTECUBITAL  Final   Special Requests BOTTLES DRAWN AEROBIC AND ANAEROBIC 5ML  Final   Culture   Final    NO GROWTH 5 DAYS Performed at [facility]    Report Status 05/16/2015 FINAL  Final     Studies: No results found.  Scheduled Meds: . atorvastatin  40 mg Oral QHS  . diphenhydrAMINE  25 mg Oral Once  . feeding supplement (ENSURE ENLIVE)  237 mL Oral BID BM  . furosemide  40 mg Intravenous Q12H  . imipenem-cilastatin  500 mg Intravenous Q8H  . insulin aspart  0-9 Units Subcutaneous TID WC  . magnesium gluconate  500 mg Oral Daily  . metolazone  2.5 mg Oral Daily  . multivitamin with minerals  1 tablet Oral Daily  . sodium chloride flush  3 mL Intravenous Q12H  . vancomycin  750 mg Intravenous Q12H  . Warfarin - Pharmacist Dosing Inpatient   Does not apply q1800   Continuous Infusions:   Active Problems:   HTN (hypertension)   Atrial fibrillation (HCC)   Warfarin anticoagulation   Status post above knee amputation of left lower extremity (HCC)    Cellulitis and abscess of leg   Infection of prosthetic right knee joint ([place])    Time spent:35 min    [facility]  [facility] Pager [phone]. If 7PM-7AM, please contact night-coverage at [email or web address], password [facility] 05/18/2015, 11:12 AM  LOS: 7 days

## 2015-05-18 NOTE — Clinical Social Work Note (Signed)
Clinical Social Work Assessment  Patient Details  Name: Derek Frye MRN: [id number] Date of Birth: 1940/03/16  Date of referral:  05/18/15               Reason for consult:  Facility Placement, Discharge Planning                Permission sought to share information with:  Chartered certified accountant granted to share information::  Yes, Verbal Permission Granted  Name::        Agency::     Relationship::     Contact Information:     Housing/Transportation Living arrangements for the past 2 months:  Single Family Home Source of Information:  Patient, Spouse Patient Interpreter Needed:  None Criminal Activity/Legal Involvement Pertinent to Current Situation/Hospitalization:  No - Comment as needed Significant Relationships:  Spouse Lives with:  Spouse Do you feel safe going back to the place where you live?   (SNF placement needed.) Need for family participation in patient care:  Yes (Comment)  Care giving concerns:  Pt's care cannot be managed at home following hospital d/c.   Social Worker assessment / plan:  Pt hospitalized on 05/10/15 cellulitis ( R ) lower leg. Pt has an above knee amputation ( L ). RNCM spoke with pt / spouse this am and was told that pt needs SNF placement for IV medication. CSW has left a message for spouse to contact CSW for assistance with SNF placement. SNF search initiated and bed offers are pending. CSW will meet with pt / spouse to assist with SNF placement.  Employment status:  Retired Forensic scientist:  Medicare PT Recommendations:  Not assessed at this time [place] / Referral to community resources:  [place]  Patient/Family's Response to care:  Pt / spouse feel ST SNF placement is needed.  Patient/Family's Understanding of and Emotional Response to Diagnosis, Current Treatment, and Prognosis:  Pt / spouse have declined surgery at this time. They are aware pt is being treated with IV antibiotics and are  hopeful this will help save pt's leg.  Emotional Assessment Appearance:  Appears stated age Attitude/Demeanor/Rapport:  Other (cooperative) Affect (typically observed):  Appropriate, Calm Orientation:  Oriented to Self, Oriented to Place, Oriented to  Time, Oriented to Situation Alcohol / Substance use:  Not Applicable Psych involvement (Current and /or in the community):  No (Comment)  Discharge Needs  Concerns to be addressed:  Discharge Planning Concerns Readmission within the last 30 days:  No Current discharge risk:  None Barriers to Discharge:  No Barriers Identified   Loraine Maple  [id number] 05/18/2015, 12:17 PM

## 2015-05-18 NOTE — Care Management Note (Signed)
Case Management Note  Patient Details  Name: Derek Frye MRN: [id number] Date of Birth: 09/30/1940  Subjective/Objective:                  Cellulitis and abscess of leg  Infection of prosthetic right knee joint Action/Plan: Discharge planning Expected Discharge Date:                  Expected Discharge Plan:  Skilled Nursing Facility  In-House Referral:     Discharge planning Services  CM Consult  Post Acute Care Choice:    Choice offered to:  Patient, Spouse  DME Arranged:    DME Agency:     HH Arranged:    [place] Agency:     Status of Service:  Completed, signed off  Medicare Important Message Given:    Date Medicare IM Given:    Medicare IM give by:    Date Additional Medicare IM Given:    Additional Medicare Important Message give by:     If discussed at [place] of Stay Meetings, dates discussed:    Additional Comments: CM met with pt and wife, Derek Frye to discuss post discharge plans.  Pt will have PICC placement (ordered today) for long term (2 weeks+) IV ABX.  Family requests [facility] but verbalize understanding if [facility] is unable to accept bc of PICC IV ABX runs, are willing to go to another SNF.  CSW, Roselyn Reef is aware and arranging. No other CM needs were communicated.  Dellie Catholic, RN 05/18/2015, 11:14 AM

## 2015-05-19 LAB — CBC
HCT: 25.4 % — ABNORMAL LOW (ref 39.0–52.0)
Hemoglobin: 8.8 g/dL — ABNORMAL LOW (ref 13.0–17.0)
MCH: 27.1 pg (ref 26.0–34.0)
MCHC: 34.6 g/dL (ref 30.0–36.0)
MCV: 78.2 fL (ref 78.0–100.0)
PLATELETS: 136 10*3/uL — AB (ref 150–400)
RBC: 3.25 MIL/uL — ABNORMAL LOW (ref 4.22–5.81)
RDW: 25.5 % — AB (ref 11.5–15.5)
WBC: 7.9 10*3/uL (ref 4.0–10.5)

## 2015-05-19 LAB — COMPREHENSIVE METABOLIC PANEL
ALBUMIN: 2.2 g/dL — AB (ref 3.5–5.0)
ALT: 20 U/L (ref 17–63)
ANION GAP: 9 (ref 5–15)
AST: 45 U/L — ABNORMAL HIGH (ref 15–41)
Alkaline Phosphatase: 193 U/L — ABNORMAL HIGH (ref 38–126)
BUN: 30 mg/dL — ABNORMAL HIGH (ref 6–20)
CO2: 28 mmol/L (ref 22–32)
Calcium: 7.7 mg/dL — ABNORMAL LOW (ref 8.9–10.3)
Chloride: 90 mmol/L — ABNORMAL LOW (ref 101–111)
Creatinine, Ser: 0.94 mg/dL (ref 0.61–1.24)
GFR calc non Af Amer: >60 mL/min (ref >60)
GLUCOSE: 117 mg/dL — AB (ref 65–99)
POTASSIUM: 3.3 mmol/L — AB (ref 3.5–5.1)
SODIUM: 135 mmol/L (ref 135–145)
Total Bilirubin: 4.6 mg/dL — ABNORMAL HIGH (ref 0.3–1.2)
Total Protein: 6.2 g/dL — ABNORMAL LOW (ref 6.5–8.1)

## 2015-05-19 LAB — PROTIME-INR
INR: 1.83 — AB (ref 0.00–1.49)
PROTHROMBIN TIME: 20.5 s — AB (ref 11.6–15.2)

## 2015-05-19 LAB — GLUCOSE, CAPILLARY
GLUCOSE-CAPILLARY: 155 mg/dL — AB (ref 65–99)
Glucose-Capillary: 134 mg/dL — ABNORMAL HIGH (ref 65–99)
Glucose-Capillary: 159 mg/dL — ABNORMAL HIGH (ref 65–99)
Glucose-Capillary: 122 mg/dL — ABNORMAL HIGH (ref 65–99)

## 2015-05-19 MED ORDER — SODIUM CHLORIDE 0.9 % IV SOLN: 500.0000 mg | Freq: Three times a day (TID) | INTRAVENOUS | Status: AC

## 2015-05-19 MED ORDER — POTASSIUM CHLORIDE CRYS ER 20 MEQ PO TBCR
40.0000 meq | EXTENDED_RELEASE_TABLET | Freq: Two times a day (BID) | ORAL | Status: DC
  Administered 2015-05-19 – 2015-05-20 (×2): 40 meq via ORAL
  Filled 2015-05-19 (×3): qty 2

## 2015-05-19 MED ORDER — WARFARIN SODIUM 2.5 MG PO TABS
2.5000 mg | ORAL_TABLET | Freq: Once | ORAL | Status: AC
  Filled 2015-05-19: qty 1

## 2015-05-19 NOTE — Evaluation (Signed)
Physical Therapy Evaluation Patient Details Name: Derek Frye MRN: [id number] DOB: 1940/03/27 Today's Date: 05/19/2015   History of Present Illness  pt admit on 3/1 with increased cellulitis and pain in R lower leg. pt with h/o HTN, CABG x2, PVD, MI, DM, prostate cancer, a. 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   Clinical Impression  Pt with great cellulitis of his R lower extremity and decreased ROM and strength in the R knee , and s/p LAKA present with decreased strength and ability for bed mobility and transfers. PT to benefit from PT to increase in all these areas to safely return home eventually. Today he wanted to get out of bed toa recliner, at the time of the evaluation today he still had bedrest orders, however I think those were discontinued soon after our assessment. I still feel the safest way to transfer him to a recliner at this time would be with the lift. Pt is motivated to get to a recliner and feel he will soon, but will need some more training and education with sliding in order to do so safely. A lift would be safest for staff at this time.     Follow Up Recommendations SNF    Equipment Recommendations       Recommendations for Other Services       Precautions / Restrictions Precautions Precaution Comments: pt was on bedrest when eval completed, however pt states MD said he could get to a chair. Nursing is clarifying this for [place].       Mobility  Bed Mobility Overal bed mobility: +2 for physical assistance;Needs Assistance             General bed mobility comments: used rail and trapeze to assist [place], but still +2 help to roll and slide up in the bed. Pt was trying to help and able to assist some. Sat EOB for 5 minutes with Max assist needed to scoot to EOB and min to supervision level for sitting balance. UE required for sitting intermittently   Transfers                     Ambulation/Gait                Stairs            Wheelchair Mobility    Modified Rankin (Stroke Patients Only)       Balance Overall balance assessment: Needs assistance Sitting-balance support: Bilateral upper extremity supported Sitting balance-Leahy Scale: Fair Sitting balance - Comments: at times he was able to sit wihtou UE support during the 5 minute sitting time.                                      Pertinent Vitals/Pain Pain Assessment: No/denies pain    Home Living Family/patient expects to be discharged to:: Skilled nursing facility Living Arrangements: Spouse/significant other                    Prior Function Level of Independence: Needs assistance         Comments: pt states he could get into his chair but unclear of how much if any assistance he required. He doesn't lik to stay int he bed so sounds like he does get into a WC and/or recliner.  Hand Dominance        Extremity/Trunk Assessment               Lower Extremity Assessment: Generalized weakness;RLE deficits/detail RLE Deficits / Details: R LE in external and flexed position to about 30 degrees. Unable to straighten during evaluation.        Communication   Communication: No difficulties  Cognition Arousal/Alertness: Awake/alert Behavior During Therapy:  (pt spoke to wanting to "get upand move around his room". unsure of his awareness of his deficits at this time, due to he would not be able to do that at this moment) Overall Cognitive Status: Within Functional Limits for tasks assessed                      General Comments      Exercises        Assessment/Plan    PT Assessment Patient needs continued PT services  PT Diagnosis Generalized weakness   PT Problem List Decreased strength;Decreased range of motion;Decreased activity tolerance;Decreased balance;Decreased mobility;Decreased safety awareness  PT Treatment  Interventions Functional mobility training;Therapeutic activities;Therapeutic exercise;Patient/family education   PT Goals (Current goals can be found in the Care Plan section) Acute Rehab PT Goals Patient Stated Goal: I want to get out of this bed.  PT Goal Formulation: With patient Time For Goal Achievement: 06/02/15 Potential to Achieve Goals: Good    Frequency Min 3X/week   Barriers to discharge        Co-evaluation               End of Session   Activity Tolerance: Patient tolerated treatment well Patient left: in bed;with call bell/phone within reach;with bed alarm set Nurse Communication: Mobility status;Need for lift equipment (if pt wants to get to a recliner I suggest a lift )         Time: 1555-1620 PT Time Calculation (min) (ACUTE ONLY): 25 min   Charges:   PT Evaluation $PT Eval Moderate Complexity: 1 Procedure PT Treatments $Therapeutic Activity: 8-22 mins   PT G CodesClide Dales 05/31/2015, 5:37 PM  Clide Dales, PT Pager: [phone] 05-31-15

## 2015-05-19 NOTE — Progress Notes (Signed)
Nutrition Follow-up  DOCUMENTATION CODES:   Obesity unspecified  INTERVENTION:  -Continue Ensure Enlive po BID, each supplement provides 350 kcal and 20 grams of protein  -Multivitamin with minerals daily -RD to continue to monitor  NUTRITION DIAGNOSIS:   Increased nutrient needs related to wound healing as evidenced by estimated needs.  ongoing  GOAL:   Patient will meet greater than or equal to 90% of their needs Meeting  MONITOR:   PO intake, Supplement acceptance, Labs, Weight trends, Skin, I & O's  REASON FOR ASSESSMENT:   Malnutrition Screening Tool    ASSESSMENT:   75 y.o. 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 Patient has decreased appetite and his weakness has been worse lately.  Derek Frye endorses consuming 100% of ensure, and most meals. Stated that recently his food has been cold and he hasn't eaten as much of it. No acute complaints at this time.   Per Orthopedics, appears patient will not undergo surgery at this time. Will continue to treat cellulitis and venous stasis issues conservatively.  Labs: CBGs: 109-159, K 3.3, Cl 90, BUN 30, Ca 7.7 Medications reviewed.  Diet Order:  Diet Carb Modified Fluid consistency:: Thin; Room service appropriate?: Yes  Skin:  Wound (see comment) (Wound and cellulitis on R leg)  Last BM:  2/28  Height:   Ht Readings from Last 1 Encounters:  05/11/15 5\' 11"  (1.803 m)    Weight:   Wt Readings from Last 1 Encounters:  05/18/15 252 lb 3.3 oz (114.4 kg)    Ideal Body Weight:  70.5 kg (L AKA)  BMI:  Body mass index is 35.19 kg/(m^2).  Estimated Nutritional Needs:   Kcal:  2000-2200  Protein:  95-105g  Fluid:  1.8-2L/day  EDUCATION NEEDS:   Education needs addressed  Satira Anis. Derek Rodkey, MS, RD LDN After  Hours/Weekend Pager [phone]

## 2015-05-19 NOTE — Progress Notes (Signed)
[facility] PROGRESS NOTE  Derek Frye [id number] DOB: [birth year] DOA: 05/10/2015 PCP: Woody Seller, MD  Assessment/Plan: 1. 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  History of atrial fibrillation -CHADVasc score of 4, on chronically anticoagulated with warfarin  3.  Acute on Chronic systolic congestive heart failure -Last transthoracic echocardiogram performed in 2011 showed an EF of 45%. -continue IV lasix today, change to oral diuretics tomorrow, Continue metolazone 2.5 mg by mouth daily  -Replacing potassium  4.  Type 2 diabetes mellitus. -Continue Accu-Cheks before meals and at bedtime -CBGs stable  5.  History of coronary artery disease. -Status post coronary artery bypass grafting. He does not appear to have acute cardiac issues at this time.  6. Hypokalemia -K 3.2 today, will replace with kdur 40 meq PO q 6 hours x3 doses  7. Delirium/sundowning -noted 2nights ago and last night, re-orientation, trial of haldol if recurs and fails non pharma techniques  8. Hyperbilirubinemia -etiology unclear, Bili stable at 4 since admission , no baseline labs since 2014, no abd symptoms, AST/ALT within normal range, have PCP monitor this periodically ,  could have NASH  Code Status: Full code Family Communication: wife at bedside Disposition Plan: pending improvement in volume status, SNF in  1-2days   Consultants:  Orthopedic surgery  Infectious disease   Antibiotics:  IV vancomycin  IV Primaxin  HPI/Subjective: Feels fine, no events overnight, some swelling of RUE,   Objective: Filed Vitals:   05/19/15 0504 05/19/15 1424  BP: 110/60 121/69  Pulse: 61 69  Temp: 98.3 F (36.8 C) 98.7 F (37.1 C)  Resp: 15 16    Intake/Output Summary (Last 24 hours) at 05/19/15 1520 Last data filed at 05/19/15 1424  Gross per 24 hour  Intake    840 ml  Output      0 ml  Net    840 ml   Filed Weights   05/11/15 0125 05/15/15 0700 05/18/15 1945  Weight: 108.8 kg (239 lb 13.8 oz) 112 kg (246 lb 14.6 oz) 114.4 kg (252 lb 3.3 oz)    Exam:   General:  Nontoxic-appearing, he is awake and alert, oriented to self, partly to time  Cardiovascular: Regular rate and rhythm normal S1-S2  Respiratory: CTAB  Abdomen: Obese, soft nontender nondistended  Musculoskeletal: Status post left above-the-knee amputation, his right lower extremity showing improvement to erythema and swelling, minimal weeping. RUE with edema/erythema and weeping-improving  Genitourinary. Scrotal edema present  Data Reviewed: Basic Metabolic Panel:  Recent Labs Lab 05/15/15 0407 05/16/15 0355 05/17/15 0350 05/18/15 0542 05/19/15 0345  NA 129* 133* 132* 132* 135  K 3.4* 2.9* 3.2* 3.4* 3.3*  CL 94* 97* 95* 94* 90*  CO2 27 27 28 27 28   GLUCOSE 108* 134* 150* 132* 117*  BUN 36* 35* 32* 29* 30*  CREATININE 1.09 0.94 1.00 1.00 0.94  CALCIUM 8.2* 8.3* 8.1* 8.2* 7.7*   Liver Function Tests:  Recent Labs Lab 05/17/15 0350 05/18/15 0542 05/19/15 0345  AST 40 42* 45*  ALT 19 19 20  ALKPHOS 182* 191* 193*  BILITOT 4.1* 4.2* 4.6*  PROT 6.2* 6.5 6.2*  ALBUMIN 2.3* 2.3* 2.2*   No results for input(s): LIPASE, AMYLASE in the last 168 hours. No results for input(s): AMMONIA in the last 168 hours. CBC:  Recent Labs Lab 05/15/15 0407 05/16/15 0355 05/17/15 0350 05/18/15 0542  05/19/15 0345  WBC 7.5 6.8 7.4 7.1 7.9  HGB 8.9* 9.0* 9.1* 9.2* 8.8*  HCT 28.2* 28.5* 29.5* 29.4* 25.4*  MCV 74.2* 75.0* 73.8* 76.0* 78.2  PLT 130* 145* 130* 150 136*   Cardiac Enzymes: No results for input(s): CKTOTAL, CKMB, CKMBINDEX, TROPONINI in the last 168 hours. BNP (last 3 results) No results for input(s): BNP in the last 8760 hours.  ProBNP (last 3 results) No results for input(s): PROBNP in the last 8760 hours.  CBG:  Recent Labs Lab 05/18/15 1209 05/18/15 1754 05/18/15 2157 05/19/15 0813 05/19/15 1209  GLUCAP 171* 157* 109* 134* 159*    Recent Results (from the past 240 hour(s))  Blood culture (routine x 2)     Status: None   Collection Time: 05/10/15  9:28 PM  Result Value Ref Range Status   Specimen Description BLOOD RIGHT ANTECUBITAL  Final   Special Requests BOTTLES DRAWN AEROBIC AND ANAEROBIC [place]  Final   Culture   Final    NO GROWTH 5 DAYS Performed at [facility]    Report Status 05/16/2015 FINAL  Final  Blood culture (routine x 2)     Status: None   Collection Time: 05/10/15  9:46 PM  Result Value Ref Range Status   Specimen Description BLOOD RIGHT ANTECUBITAL  Final   Special Requests BOTTLES DRAWN AEROBIC AND ANAEROBIC 5ML  Final   Culture   Final    NO GROWTH 5 DAYS Performed at [facility]    Report Status 05/16/2015 FINAL  Final     Studies: Dg Chest Port 1 View  05/18/2015  CLINICAL DATA:  Status post PICC line placement EXAM: PORTABLE CHEST 1 VIEW COMPARISON:  05/10/2015 FINDINGS: A new left-sided PICC line is noted at the cavoatrial junction. Cardiac shadow remains enlarged. Postsurgical changes are again seen. Changes suggestive of small right-sided pleural effusion are noted. IMPRESSION: Status post PICC line placement at the cavoatrial junction. Electronically Signed   By: Inez Catalina M.D.   On: 05/18/2015 15:02    Scheduled Meds: . diphenhydrAMINE  25 mg Oral Once  . feeding supplement (ENSURE ENLIVE)  237 mL  Oral BID BM  . imipenem-cilastatin  500 mg Intravenous Q8H  . insulin aspart  0-9 Units Subcutaneous TID WC  . magnesium gluconate  500 mg Oral Daily  . metolazone  2.5 mg Oral Daily  . multivitamin with minerals  1 tablet Oral Daily  . sodium chloride flush  3 mL Intravenous Q12H  . [COMPLETED] warfarin  2.5 mg Oral ONCE-1800  . Warfarin - Pharmacist Dosing Inpatient   Does not apply q1800   Continuous Infusions:   Active Problems:   HTN (hypertension)   Atrial fibrillation (HCC)   Warfarin anticoagulation   Status post above knee amputation of left lower extremity (HCC)   Cellulitis and abscess of leg   Infection of prosthetic right knee joint ([place])    Time spent:35 min    [facility]  [facility] Pager [phone]. If 7PM-7AM, please contact night-coverage at [email or web address], password [facility] 05/19/2015, 3:20 PM  LOS: 8 days

## 2015-05-19 NOTE — Progress Notes (Signed)
ANTICOAGULATION CONSULT NOTE - Follow Up Consult  Pharmacy Consult for Warfarin Indication: atrial fibrillation  Allergies  Allergen Reactions  . Cefepime     ? tremors  . Amoxicillin     REACTION: swelling and a rash  . Morphine Nausea And Vomiting   Patient Measurements: Height: 5\' 11"  (180.3 cm) Weight: 252 lb 3.3 oz (114.4 kg) IBW/kg (Calculated) : 75.3   Vital Signs: Temp: 98.3 F (36.8 C) (03/10 0504) Temp Source: Oral (03/10 0504) BP: 110/60 mmHg (03/10 0504) Pulse Rate: 61 (03/10 0504)  Labs:  Recent Labs  05/17/15 0350 05/18/15 0542 05/19/15 0345  HGB 9.1* 9.2* 8.8*  HCT 29.5* 29.4* 25.4*  PLT 130* 150 136*  LABPROT 20.4* 19.3* 20.5*  INR 1.75* 1.69* 1.83*  CREATININE 1.00 1.00 0.94   Estimated Creatinine Clearance: 86 mL/min (by C-G formula based on Cr of 0.94).  Assessment: 51 yoM on warfarin for chronic atrial fibrillation.  Currently Primaxin only for knee infection.  Originally bridged with heparin, now resuming warfarin with no plans for OR  Baseline INR elevated, did receive Levaquin x 1 on 3/2  Prior anticoagulation: warfarin 1.25 mg daily; LD 3/1  Significant events: 3/7: OR feels not appropriate for surgery, warfarin resumed w/o bridge as INR was > 1.9  Today, 05/19/2015:  CBC: Hgb and Plt both low but stable  INR nadir 3/9 after 5 days off warfarin, now rising again but still subtherapeutic  Major drug interactions: none, but is on broad spectrum abx  No bleeding issues documented per nursing  Eating 10-100% of meals  Goal of Therapy: INR 2-3  Plan:  Warfarin 2.5 mg PO tonight at 18:00.  Daily INR  CBC at least q72 hr while on warfarin  Monitor for signs of bleeding or thrombosis  Reuel Boom, PharmD, BCPS Pager: [phone] 05/19/2015, 12:56 PM

## 2015-05-20 DIAGNOSIS — R17 Unspecified jaundice: Secondary | ICD-10-CM | POA: Insufficient documentation

## 2015-05-20 LAB — GLUCOSE, CAPILLARY
GLUCOSE-CAPILLARY: 110 mg/dL — AB (ref 65–99)
Glucose-Capillary: 98 mg/dL (ref 65–99)

## 2015-05-20 LAB — BASIC METABOLIC PANEL
ANION GAP: 9 (ref 5–15)
BUN: 31 mg/dL — ABNORMAL HIGH (ref 6–20)
CALCIUM: 8.3 mg/dL — AB (ref 8.9–10.3)
CO2: 30 mmol/L (ref 22–32)
Chloride: 96 mmol/L — ABNORMAL LOW (ref 101–111)
Creatinine, Ser: 0.92 mg/dL (ref 0.61–1.24)
GLUCOSE: 106 mg/dL — AB (ref 65–99)
POTASSIUM: 3 mmol/L — AB (ref 3.5–5.1)
Sodium: 135 mmol/L (ref 135–145)

## 2015-05-20 LAB — PROTIME-INR
INR: 2.03 — AB (ref 0.00–1.49)
Prothrombin Time: 22.1 seconds — ABNORMAL HIGH (ref 11.6–15.2)

## 2015-05-20 MED ORDER — FUROSEMIDE 20 MG PO TABS
20.0000 mg | ORAL_TABLET | Freq: Every day | ORAL | Status: DC
  Filled 2015-05-20: qty 1

## 2015-05-20 MED ORDER — TRAMADOL HCL 50 MG PO TABS: 50.0000 mg | ORAL_TABLET | Freq: Four times a day (QID) | ORAL | Status: AC | PRN

## 2015-05-20 MED ORDER — POTASSIUM CHLORIDE CRYS ER 20 MEQ PO TBCR: 40.0000 meq | EXTENDED_RELEASE_TABLET | Freq: Two times a day (BID) | ORAL | Status: AC

## 2015-05-20 MED ORDER — HEPARIN SOD (PORK) LOCK FLUSH 100 UNIT/ML IV SOLN: 250.0000 [IU] | INTRAVENOUS | Status: DC | PRN

## 2015-05-20 MED ORDER — FUROSEMIDE 40 MG PO TABS: 40.0000 mg | ORAL_TABLET | Freq: Two times a day (BID) | ORAL | Status: AC

## 2015-05-20 MED ORDER — WARFARIN SODIUM 2 MG PO TABS
2.0000 mg | ORAL_TABLET | Freq: Once | ORAL | Status: DC
  Filled 2015-05-20: qty 1

## 2015-05-20 MED ORDER — SPIRONOLACTONE 25 MG PO TABS
25.0000 mg | ORAL_TABLET | Freq: Every day | ORAL | Status: DC
  Filled 2015-05-20: qty 1

## 2015-05-20 MED ORDER — UNABLE TO FIND: Status: AC

## 2015-05-20 NOTE — Progress Notes (Signed)
ANTICOAGULATION CONSULT NOTE - Follow Up Consult  Pharmacy Consult for Warfarin Indication: atrial fibrillation  Allergies  Allergen Reactions  . Cefepime     ? tremors  . Amoxicillin     REACTION: swelling and a rash  . Morphine Nausea And Vomiting   Patient Measurements: Height: 5\' 11"  (180.3 cm) Weight: 250 lb 7.1 oz (113.6 kg) IBW/kg (Calculated) : 75.3   Vital Signs: Temp: 97.6 F (36.4 C) (03/11 0712) Temp Source: Oral (03/11 0712) BP: 120/70 mmHg (03/11 0712) Pulse Rate: 64 (03/11 0712)  Labs:  Recent Labs  05/18/15 0542 05/19/15 0345 05/20/15 0615  HGB 9.2* 8.8*  --   HCT 29.4* 25.4*  --   PLT 150 136*  --   LABPROT 19.3* 20.5* 22.1*  INR 1.69* 1.83* 2.03*  CREATININE 1.00 0.94 0.92   Estimated Creatinine Clearance: 87.5 mL/min (by C-G formula based on Cr of 0.92).  Assessment: 41 yoM on warfarin for chronic atrial fibrillation.  Currently Primaxin only for knee infection.  Originally bridged with heparin, now resuming warfarin with no plans for OR  Baseline INR elevated, did receive Levaquin x 1 on 3/2  Prior anticoagulation: warfarin 1.25 mg daily; LD 3/1  Significant events: 3/7: OR feels not appropriate for surgery, warfarin resumed w/o bridge as INR was > 1.9  Today, 05/20/2015:  CBC: Hgb and Plt both low but stable  INR 2.03 therapeutic (dose not charted as given last night)  Major drug interactions: none, but is on broad spectrum abx  No bleeding issues documented per nursing  Eating 10-100% of meals  Goal of Therapy: INR 2-3  Plan:  Warfarin 2 mg PO tonight at 18:00.  Daily INR  CBC at least q72 hr while on warfarin  Monitor for signs of bleeding or thrombosis  Dolly Rias RPh 05/20/2015, 11:41 AM Pager [phone]

## 2015-05-20 NOTE — Discharge Summary (Addendum)
Physician Discharge Summary  Derek Frye [id number] DOB: Mar 18, 1940 DOA: 05/10/2015  PCP: Woody Seller, MD  Admit date: 05/10/2015 Discharge date: 05/20/2015  Time spent: 45 minutes  Recommendations for Outpatient Follow-up:  1. Please titrate diuretic/lasix dose, dose increased to 40mg  BID for 3-4days, titrate down to 40mg  daily after that, please monitor Bmet in 3days and INR in 3days 2. Please monitor LFTS, has chronic hyperbilirubinemia in 3-4range 3. Recommendations by Wound RN: Barrier cream to protect buttocks and antifungal powder to skin folds 4. Continue Imipenem till 3/23, then Stop and remove PICC Line 5. 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 Wound Infection   HPI: Derek Frye is a 75 y.o. 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 Patient has decreased appetite and his weakness has been worse lately. No fever or chills. Patient is being followed by Dr. Baxter Flattery for his antibiotics       Hospital Course:  1. Right lower extremity cellulitis -complex past medical history including left above-the-knee amputation related to infected total knee replacement, and has a prosthetic  joint infection of R knee s/p 2 surgeries on chronic suppression Abx (h/o pseudomonas on Doxy) -ID Dr.Hatcher and Ortho Dr.Olin consulting -MRI without obvious evidence of Osteomyelitis, no abscess -Blood cultures negative -Ortho recommended non operative management -Infectious disease consulting, recommended 2 weeks of Imipenem or Meropenem -now on Imipenem, PICC placed 3/9,needs 14days till 3/23 per Dr.Hatcher -FU with Dr.Snider  2. History of atrial fibrillation -CHADVasc score of 4, on chronically anticoagulated with warfarin -INR 2 at discharge  3. Acute on Chronic systolic congestive heart failure -Last transthoracic echocardiogram performed in 2011 showed an EF of 45%. -slightly volume overloaded, diuresed with IV lasix for 2days and increased PO lasix dose at discharge for few more days and then can be cut back to basal dose, continue aldactone and metolazone -needs close monitoring of Bmet in 3days, to FU creatinine, K -Replacing potassium  4. Type 2 diabetes mellitus. -CBGs stable, resumed metformin  5. History of coronary artery disease. -Status post coronary artery bypass grafting. -stable  6. Hypokalemia -replaced  7. Delirium/sundowning -noted 2nights ago and last night, re-orientation, happens intermittently, no episodes in last 2days  8. Chronic Hyperbilirubinemia -etiology unclear, Bili stable at 4 since admission , no baseline labs since 2014, no abd symptoms, AST/ALT within normal range, please have PCP monitor this periodically, could have NASH  Consultations:  Infectious disease  Ortho Dr.Olin  Discharge Exam: Filed Vitals:   05/19/15 2240 05/20/15 0712  BP: 119/58 120/70  Pulse: 63 64  Temp: 98.4 F (36.9 C) 97.6 F (36.4 C)  Resp: 20 20    General:AAOx3 Cardiovascular: S1S2/RRR  Respiratory: CTAB  Discharge Instructions   Discharge Instructions    Diet - low sodium heart healthy    Complete by:  As directed      Diet Carb  Modified    Complete by:  As directed      Increase activity slowly    Complete by:  As directed           Current Discharge Medication List    START taking these medications   Details  imipenem-cilastatin 500 mg in sodium chloride 0.9 % 100 mL Inject 500 mg into the vein every 8 (eight) hours. For 2 weeks starting 05/18/15    UNABLE TO FIND Apply antifungal powder to skin folds, legs folds BID as needed PRN Qty: 1 each, Refills: 0      CONTINUE these medications which have CHANGED   Details  furosemide (LASIX) 40 MG tablet Take 1 tablet (40 mg total) by mouth 2 (two) times daily. For 4days then change to 40mg  daily Qty: 30 tablet, Refills: 0    potassium chloride SA (K-DUR,KLOR-CON) 20 MEQ tablet Take 2 tablets (40 mEq total) by mouth 2 (two) times daily.    traMADol (ULTRAM) 50 MG tablet Take 1 tablet (50 mg total) by mouth every 6 (six) hours as needed for moderate pain or severe pain. Qty: 30 tablet, Refills: 0      CONTINUE these medications which have NOT CHANGED   Details  atorvastatin (LIPITOR) 40 MG tablet Take 40 mg by mouth at bedtime.     gabapentin (NEURONTIN) 100 MG capsule Take 100 mg by mouth 3 (three) times daily as needed (pain).     magnesium gluconate (MAGONATE) 500 MG tablet Take 500 mg by mouth daily.     metFORMIN (GLUCOPHAGE) 850 MG tablet Take 850 mg by mouth 2 (two) times daily with a meal.     metolazone (ZAROXOLYN) 2.5 MG tablet Take 2.5 mg by mouth daily.     spironolactone (ALDACTONE) 25 MG tablet Take 25 mg by mouth 2 (two) times daily. Take one (1) tablet by mouth two (2) times a day.    warfarin (COUMADIN) 2.5 MG tablet Take as directed by coumadin clinic Qty: 75 tablet, Refills: 0      STOP taking these medications     doxycycline (VIBRA-TABS) 100 MG tablet        Allergies  Allergen Reactions  . Cefepime     ? tremors  . Amoxicillin     REACTION: swelling and a rash  . Morphine Nausea And Vomiting   Follow-up Information     Follow up with [name], MD In 3 weeks.   Specialty:  Orthopedic Surgery   Why:  follow up right leg wound and knee   Contact information:   [address] [place] [address] [phone]       Follow up with Woody Seller, MD. Schedule an appointment as soon as possible for a visit in 1 week.   Specialty:  Family Medicine   Why:  Need Bmet and INR check in 3days   Contact information:   [address] [place] [address] [phone]       Follow up with [facility] .   Specialty:  Rafael Capo information:   [address] [place] [phone]       The results of significant diagnostics from this hospitalization (including imaging, microbiology, ancillary and laboratory) are listed  below for reference.    Significant Diagnostic Studies: Dg Chest 2 View  05/10/2015  CLINICAL DATA:  Shortness of breath EXAM: CHEST  2 VIEW COMPARISON:  07/14/2012 chest radiograph. FINDINGS: Sternotomy wires appear aligned and intact. CABG clips overlie the mediastinum. Stable cardiomediastinal silhouette with mild cardiomegaly. No pneumothorax. Trace bilateral pleural effusions. Mild pulmonary edema. No acute consolidative airspace disease. IMPRESSION: 1. Mild congestive heart failure. 2. Trace bilateral pleural effusions. Electronically Signed   By: Ilona Sorrel M.D.   On: 05/10/2015 19:20   Dg Tibia/fibula Right  05/10/2015  CLINICAL DATA:  Swelling, erythema and weeping wounds. Diabetes mellitus. Clinical concern for osteomyelitis. EXAM: RIGHT TIBIA AND FIBULA - 2 VIEW COMPARISON:  None. FINDINGS: There is diffuse soft tissue swelling. Surgical clips are noted in the medial soft tissues. Vascular calcifications are seen throughout the soft tissues. Patient is status post right total knee arthroplasty. There is prominent periprosthetic lucency at the posterior margin of the right distal femoral  prosthesis, which is new since 05/02/2003. No appreciable periprosthetic lucency at the proximal right tibial prosthesis. There is suggestion of erosive change at the posterior upper right patella. There is prominent prepatellar soft tissue swelling. Probable small right knee joint effusion. No additional cortical erosions are seen in the right tibia or right fibula. No fracture or suspicious focal osseous lesion. 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 IMPRESSION: 1. New periprosthetic lucency surrounding the right distal femoral prosthesis. New erosive change at the posterior upper right patella. Probable small right knee joint effusion. Prominent prepatellar soft tissue swelling. These findings suggest septic arthritis / prosthetic infection in the right knee. 2. No additional sites of potential bone infection in the right tibia/fibula at radiography. Electronically Signed   By: Ilona Sorrel M.D.   On: 05/10/2015 19:26   Mr Tibia Fibula Right W Wo Contrast  05/12/2015  CLINICAL DATA:  Cellulitis and abscess of the right lower leg. EXAM: MRI OF LOWER RIGHT EXTREMITY WITHOUT AND WITH CONTRAST TECHNIQUE: Multiplanar, multisequence MR imaging of the right lower leg was performed both before and after administration of intravenous contrast. CONTRAST:  55mL MULTIHANCE GADOBENATE DIMEGLUMINE 529 MG/ML IV SOLN COMPARISON:  05/10/2015 FINDINGS: Metal artifact in the proximal tibia due to the knee prosthesis. Small focus of edema with little or no associated enhancement proximally in the tibial shaft, image 16 series 5. Distal tibial shaft intramedullary lesion with high T2 and primarily low T1 signal characteristics but some central high T1 signal observed, little change between pre and postcontrast axial images, image 16 series 8. Degenerative findings at the tibiotalar articulation. Extensive subcutaneous edema and suspected subcutaneous enhancement in the lower leg with cutaneous thickening. Edema involving the  tibialis anterior muscle, gastrocnemius musculature, soleus muscle, and peroneus longus muscle, without definite muscular enhancement. No drainable abscess. Metal artifact along the lower leg likely from saphenous vein harvesting. IMPRESSION: 1. Several foci of increased T2 signal are present in the shaft of the tibia. I am doubtful that these are significantly enhancing although the lack of precontrast fat saturated T1 weighted sequences lowers sensitivity for mild degrees of enhancement. Both of these lesions are relatively nonspecific although the more proximal lesion may be a chronic incidental lesion related to the implant placement. Chronic osteomyelitis is considered unlikely but not totally excluded. The distal lesion could possibly be an enchondroma based on its signal characteristics. Both lesions are relatively occult on conventional radiography. 2. Diffuse subcutaneous edema with some subcutaneous enhancement suggesting cellulitis. 3. Patchy muscular edema without  definite enhancement. Accordingly this mild muscular edema may be stair L. 4. No drainable abscess. Electronically Signed   By: Van Clines M.D.   On: 05/12/2015 09:44   Dg Chest Port 1 View  05/18/2015  CLINICAL DATA:  Status post PICC line placement EXAM: PORTABLE CHEST 1 VIEW COMPARISON:  05/10/2015 FINDINGS: A new left-sided PICC line is noted at the cavoatrial junction. Cardiac shadow remains enlarged. Postsurgical changes are again seen. Changes suggestive of small right-sided pleural effusion are noted. IMPRESSION: Status post PICC line placement at the cavoatrial junction. Electronically Signed   By: Inez Catalina M.D.   On: 05/18/2015 15:02   Dg Foot 2 Views Right  05/10/2015  CLINICAL DATA:  Diabetes mellitus. Right lower extremity swelling, erythema and wounds. EXAM: RIGHT FOOT - 2 VIEW COMPARISON:  None. FINDINGS: Severe diffuse soft tissue swelling. Vascular calcifications throughout the soft tissues. No appreciable soft  tissue gas. No definite cortical erosions or periosteal reaction. Severe osteoarthritis at the first metatarsal-phalangeal joint. Mild osteoarthritis in the right ankle joint and tarsometatarsal joints. No fracture, dislocation or suspicious focal osseous lesion. Small Achilles and plantar right calcaneal spurs. IMPRESSION: Severe diffuse soft tissue swelling. No specific radiographic findings of osteomyelitis in the right foot. Electronically Signed   By: Ilona Sorrel M.D.   On: 05/10/2015 19:28   US Abdomen Limited Ruq  05/10/2015  CLINICAL DATA:  Jaundice. Elevated bilirubin and alkaline phosphatase. EXAM: US ABDOMEN LIMITED - RIGHT UPPER QUADRANT COMPARISON:  CT, 06/24/2013 FINDINGS: Gallbladder: Multiple gallstones, largest measuring 1.7 cm lying in the gallbladder neck. No wall thickening. Wall measures 2.8 mm in greatest thickness. No pericholecystic fluid. No sonographic Murphy's sign. Common bile duct: Diameter: 4.4 mm Liver: Coarsened echotexture. Mildly nodular contour. No mass or focal lesion. IMPRESSION: 1. Cholelithiasis without evidence of acute cholecystitis. 2. No bile duct dilation. 3. Mildly coarsened echotexture of the liver with subtle surface nodularity. Consider cirrhosis in the proper clinical setting. Electronically Signed   By: Lajean Manes M.D.   On: 05/10/2015 22:56    Microbiology: Recent Results (from the past 240 hour(s))  Blood culture (routine x 2)     Status: None   Collection Time: 05/10/15  9:28 PM  Result Value Ref Range Status   Specimen Description BLOOD RIGHT ANTECUBITAL  Final   Special Requests BOTTLES DRAWN AEROBIC AND ANAEROBIC [place]  Final   Culture   Final    NO GROWTH 5 DAYS Performed at [facility]    Report Status 05/16/2015 FINAL  Final  Blood culture (routine x 2)     Status: None   Collection Time: 05/10/15  9:46 PM  Result Value Ref Range Status   Specimen Description BLOOD RIGHT ANTECUBITAL  Final   Special Requests BOTTLES DRAWN  AEROBIC AND ANAEROBIC 5ML  Final   Culture   Final    NO GROWTH 5 DAYS Performed at [facility]    Report Status 05/16/2015 FINAL  Final     Labs: Basic Metabolic Panel:  Recent Labs Lab 05/16/15 0355 05/17/15 0350 05/18/15 0542 05/19/15 0345 05/20/15 0615  NA 133* 132* 132* 135 135  K 2.9* 3.2* 3.4* 3.3* 3.0*  CL 97* 95* 94* 90* 96*  CO2 27 28 27 28 30   GLUCOSE 134* 150* 132* 117* 106*  BUN 35* 32* 29* 30* 31*  CREATININE 0.94 1.00 1.00 0.94 0.92  CALCIUM 8.3* 8.1* 8.2* 7.7* 8.3*   Liver Function Tests:  Recent Labs Lab 05/17/15 0350 05/18/15 0542  05/19/15 0345  AST 40 42* 45*  ALT 19 19 20   ALKPHOS 182* 191* 193*  BILITOT 4.1* 4.2* 4.6*  PROT 6.2* 6.5 6.2*  ALBUMIN 2.3* 2.3* 2.2*   No results for input(s): LIPASE, AMYLASE in the last 168 hours. No results for input(s): AMMONIA in the last 168 hours. CBC:  Recent Labs Lab 05/15/15 0407 05/16/15 0355 05/17/15 0350 05/18/15 0542 05/19/15 0345  WBC 7.5 6.8 7.4 7.1 7.9  HGB 8.9* 9.0* 9.1* 9.2* 8.8*  HCT 28.2* 28.5* 29.5* 29.4* 25.4*  MCV 74.2* 75.0* 73.8* 76.0* 78.2  PLT 130* 145* 130* 150 136*   Cardiac Enzymes: No results for input(s): CKTOTAL, CKMB, CKMBINDEX, TROPONINI in the last 168 hours. BNP: BNP (last 3 results) No results for input(s): BNP in the last 8760 hours.  ProBNP (last 3 results) No results for input(s): PROBNP in the last 8760 hours.  CBG:  Recent Labs Lab 05/19/15 1209 05/19/15 1705 05/19/15 2240 05/20/15 0715 05/20/15 1150  GLUCAP 159* 122* 155* 98 110*       Signed:  Takeysha Bonk MD.  [facility] 05/20/2015, 2:24 PM

## 2015-05-20 NOTE — Progress Notes (Signed)
Pt for discharge to [facility].   CSW facilitated pt discharge needs including contacting facility, faxing pt discharge information, providing RN phone number to call report, discussing with pt and pt wife at bedside, and arranging ambulance transport for pt to [facility].   Pt wife anxious about transfer and ensuring facility had the information needed to care for pt at SNF. CSW discussed that CSW had sent discharge summary and CSW will include wound care notes in packet for facility to refer to regarding pt wound care needs. After Visit Summary (AVS) provided to pt wife with pt discharge medications. Pt wife expressed appreciation.   No further social work needs identified at this time.  CSW signing off.   Alison Murray, MSW, LCSW Clinical Social Work Weekend coverage  [phone]

## 2015-05-20 NOTE — Clinical Social Work Placement (Signed)
   CLINICAL SOCIAL WORK PLACEMENT  NOTE  Date:  05/20/2015  Patient Details  Name: Derek Frye MRN: [id number] Date of Birth: 1940/07/06  Clinical Social Work is seeking post-discharge placement for this patient at the [place] level of care (*CSW will initial, date and re-position this form in  chart as items are completed):  Yes   Patient/family provided with [place] Work Department's list of facilities offering this level of care within the geographic area requested by the patient (or if unable, by the patient's family).  Yes   Patient/family informed of their freedom to choose among providers that offer the needed level of care, that participate in Medicare, Medicaid or managed care program needed by the patient, have an available bed and are willing to accept the patient.  Yes   Patient/family informed of [facility]'s ownership interest in [facility] and [facility], as well as of the fact that they are under no obligation to receive care at these facilities.  PASRR submitted to EDS on       PASRR number received on       Existing PASRR number confirmed on 05/18/15     FL2 transmitted to all facilities in geographic area requested by pt/family on 05/18/15     FL2 transmitted to all facilities within larger geographic area on       Patient informed that his/her managed care company has contracts with or will negotiate with certain facilities, including the following:        Yes   Patient/family informed of bed offers received.  Patient chooses bed at [facility]     Physician recommends and patient chooses bed at      Patient to be transferred to [facility] on 05/20/15.  Patient to be transferred to facility by ambulance [facility])     Patient family notified on 05/20/15 of transfer.  Name of family member notified:  pt and pt wife notified at bedside     PHYSICIAN       Additional Comment:     _______________________________________________ Ladell Pier, LCSW 05/20/2015, 2:57 PM

## 2015-05-20 NOTE — Progress Notes (Signed)
Pt discharged from floor via [facility]

## 2015-05-20 NOTE — Progress Notes (Signed)
Call to [employer] at [facility] with report; Lattie Haw aware that [facility] has been notified and that patient will be admitting to facility today

## 2015-06-05 ENCOUNTER — Ambulatory Visit (INDEPENDENT_AMBULATORY_CARE_PROVIDER_SITE_OTHER): Payer: Medicare Other | Admitting: Internal Medicine

## 2015-06-05 DIAGNOSIS — R630 Anorexia: Secondary | ICD-10-CM

## 2015-06-05 DIAGNOSIS — T8450XD Infection and inflammatory reaction due to unspecified internal joint prosthesis, subsequent encounter: Secondary | ICD-10-CM

## 2015-06-05 DIAGNOSIS — L03113 Cellulitis of right upper limb: Secondary | ICD-10-CM | POA: Diagnosis not present

## 2015-06-05 DIAGNOSIS — L03115 Cellulitis of right lower limb: Secondary | ICD-10-CM

## 2015-06-05 LAB — CBC WITH DIFFERENTIAL/PLATELET
BASOS ABS: 0.1 10*3/uL (ref 0.0–0.1)
Basophils Relative: 1 % (ref 0–1)
EOS ABS: 0.1 10*3/uL (ref 0.0–0.7)
EOS PCT: 1 % (ref 0–5)
HCT: 32.1 % — ABNORMAL LOW (ref 39.0–52.0)
Hemoglobin: 9.4 g/dL — ABNORMAL LOW (ref 13.0–17.0)
LYMPHS ABS: 1.3 10*3/uL (ref 0.7–4.0)
LYMPHS PCT: 17 % (ref 12–46)
MCH: 21.8 pg — ABNORMAL LOW (ref 26.0–34.0)
MCHC: 29.3 g/dL — ABNORMAL LOW (ref 30.0–36.0)
MCV: 74.3 fL — AB (ref 78.0–100.0)
Monocytes Absolute: 1.1 10*3/uL — ABNORMAL HIGH (ref 0.1–1.0)
Monocytes Relative: 15 % — ABNORMAL HIGH (ref 3–12)
NEUTROS PCT: 66 % (ref 43–77)
Neutro Abs: 5 10*3/uL (ref 1.7–7.7)
PLATELETS: 146 10*3/uL — AB (ref 150–400)
RBC: 4.32 MIL/uL (ref 4.22–5.81)
RDW: 22.5 % — AB (ref 11.5–15.5)
WBC: 7.6 10*3/uL (ref 4.0–10.5)

## 2015-06-05 LAB — BASIC METABOLIC PANEL
BUN: 34 mg/dL — ABNORMAL HIGH (ref 7–25)
CHLORIDE: 96 mmol/L — AB (ref 98–110)
CO2: 30 mmol/L (ref 20–31)
Calcium: 7.8 mg/dL — ABNORMAL LOW (ref 8.6–10.3)
Creat: 1.16 mg/dL (ref 0.70–1.18)
Glucose, Bld: 99 mg/dL (ref 65–99)
POTASSIUM: 3.8 mmol/L (ref 3.5–5.3)
Sodium: 140 mmol/L (ref 135–146)

## 2015-06-05 LAB — C-REACTIVE PROTEIN: CRP: 2.1 mg/dL — ABNORMAL HIGH (ref <0.60)

## 2015-06-05 LAB — SEDIMENTATION RATE: SED RATE: 20 mm/h (ref 0–20)

## 2015-06-07 NOTE — Progress Notes (Signed)
Patient ID: Derek Frye, male   DOB: 01/29/1941, 75 y.o.   MRN: [id number]       RFV: follow up for right lower leg cellulitis Patient ID: Derek Frye, male   DOB: [birth year], 75 y.o.   MRN: [id number]  HPI 75yo m with chronic right knee PJI, hx of left aka, debilitated -wheelchair dependent who was recently admitted with worsening right leg cellulitis. He was discharged on 2 wk of imipenem. No positive cultures during admission. He did sustain IV infiltrating into his right arm, which still remains swollen. His wife is worried about his arm as well as the condition of his leg. No worsening to erythema but he is globally weak and has not had good appetite. He was started on keflex by his pcp due to concern for cellulitis to right arm in areas where edema/infiltration is noted.  Outpatient Encounter Prescriptions as of 06/05/2015  Medication Sig  . atorvastatin (LIPITOR) 40 MG tablet Take 40 mg by mouth at bedtime.   . furosemide (LASIX) 40 MG tablet Take 1 tablet (40 mg total) by mouth 2 (two) times daily. For 4days then change to 40mg  daily  . gabapentin (NEURONTIN) 100 MG capsule Take 100 mg by mouth 3 (three) times daily as needed (pain).   Marland Kitchen imipenem-cilastatin 500 mg in sodium chloride 0.9 % 100 mL Inject 500 mg into the vein every 8 (eight) hours. For 2 weeks starting 05/18/15  . magnesium gluconate (MAGONATE) 500 MG tablet Take 500 mg by mouth daily.   . metFORMIN (GLUCOPHAGE) 850 MG tablet Take 850 mg by mouth 2 (two) times daily with a meal.   . metolazone (ZAROXOLYN) 2.5 MG tablet Take 2.5 mg by mouth daily.   . potassium chloride SA (K-DUR,KLOR-CON) 20 MEQ tablet Take 2 tablets (40 mEq total) by mouth 2 (two) times daily.  Marland Kitchen spironolactone (ALDACTONE) 25 MG tablet Take 25 mg by mouth 2 (two) times daily. Take one (1) tablet by mouth two (2) times a day.  . traMADol (ULTRAM) 50 MG tablet Take 1 tablet (50 mg total) by mouth every 6 (six) hours as needed for moderate pain or severe  pain.  Marland Kitchen UNABLE TO FIND Apply antifungal powder to skin folds, legs folds BID as needed PRN  . warfarin (COUMADIN) 2.5 MG tablet Take as directed by coumadin clinic (Patient taking differently: Take 1.25 mg by mouth daily. Take as directed by coumadin clinic)   No facility-administered encounter medications on file as of 06/05/2015.     Patient Active Problem List   Diagnosis Date Noted  . Elevated bilirubin   . Infection of prosthetic right knee joint ([place])   . Cellulitis and abscess of leg 05/11/2015  . Status post above knee amputation of left lower extremity ([place]) 01/05/2015  . RBBB 01/05/2015  . Coronary artery disease due to lipid rich plaque 01/05/2015  . Tobacco abuse 01/05/2015  . H/O endovascular stent graft for abdominal aortic aneurysm 01/24/2014  . Prosthetic joint infection ([place]) 12/06/2013  . Aftercare following surgery of the circulatory system, [place] 07/05/2013  . Encounter for therapeutic drug monitoring 04/09/2013  . Bradycardia   . Unspecified constipation 07/20/2012  . Type II or unspecified type diabetes mellitus with peripheral circulatory disorders, uncontrolled(250.72) 07/17/2012  . Cellulitis and abscess of lower leg 07/07/2012  . Ejection fraction < 50%   . 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 Gram-negative infection 11/19/2010  . HTN (hypertension)   . Obesity   .  CAD (coronary artery disease)   . Hyperlipidemia   . Cigarette smoker   . Atrial fibrillation ([place])   . Volume overload   . Right bundle branch block   . Warfarin anticoagulation   . Carotid artery disease ([place])   . Long term (current) use of anticoagulants 06/11/2010  . STREPTOCOCCUS INFECTION CCE & UNS SITE GROUP B 02/28/2010  . CELLULITIS AND ABSCESS OF LEG EXCEPT FOOT 12/19/2009  . DYSLIPIDEMIA 11/08/2008  . Fluid overload 11/08/2008  . AAA 11/08/2008  . TOBACCO ABUSE, HX OF 11/08/2008     Health Maintenance Due  Topic Date Due  . FOOT EXAM  12/22/1948  .  OPHTHALMOLOGY EXAM  12/22/1948  . URINE MICROALBUMIN  12/22/1948  . TETANUS/TDAP  12/22/1957  . ZOSTAVAX  12/23/1998  . PNA vac Low Risk Adult (1 of 2 - PCV13) 12/23/2003  . HEMOGLOBIN A1C  01/06/2013  . INFLUENZA VACCINE  10/10/2014     Review of Systems  Physical Exam  There were no vitals taken for this visit. Physical Exam  Constitutional: He is oriented to person, place, and time. He appears chronically ill/weak. pallor HENT: dry oral mucosa. Pale conjunctiva Mouth/Throat: Oropharynx is clear and moist. No oropharyngeal exudate.  Cardiovascular: Normal rate, regular rhythm and normal heart sounds. Exam reveals no gallop and no friction rub.  No murmur heard.  Pulmonary/Chest: Effort normal and breath sounds normal. No respiratory distress. He has no wheezes.  Skin= left aka, right leg has hyperpigmentation from venous stasis changes. Trace edema. No open wounds, no drainage Ext = right arm pitting edema proximal nad distal elbow. Slight blancking erythema to elbow. No effusion    CBC Lab Results  Component Value Date   WBC 7.6 06/05/2015   RBC 4.32 06/05/2015   HGB 9.4* 06/05/2015   HCT 32.1* 06/05/2015   PLT 146* 06/05/2015   MCV 74.3* 06/05/2015   MCH 21.8* 06/05/2015   MCHC 29.3* 06/05/2015   RDW 22.5* 06/05/2015   LYMPHSABS 1.3 06/05/2015   MONOABS 1.1* 06/05/2015   EOSABS 0.1 06/05/2015   BASOSABS 0.1 06/05/2015   BMET Lab Results  Component Value Date   NA 140 06/05/2015   K 3.8 06/05/2015   CL 96* 06/05/2015   CO2 30 06/05/2015   GLUCOSE 99 06/05/2015   BUN 34* 06/05/2015   CREATININE 1.16 06/05/2015   CALCIUM 7.8* 06/05/2015   GFRNONAA >60 05/20/2015   GFRAA >60 05/20/2015   Lab Results  Component Value Date   ESRSEDRATE 20 06/05/2015   Lab Results  Component Value Date   CRP 2.1* 06/05/2015     Assessment and Plan  - cellulitis of right leg appears resolved -   - chronic pji of right knee = monitor off of abtx once he finishes his  oral abx for cellulitis  - cellulitis of right arm - finish keflex, added doxy incase mrsa or mrse component  - edema for right arm - may have been triggered by iv infiltration but now appears more consistent with anasarca. Family reported that imaging was negative for dvt  - poor oral intake/malnutrition = encouraged better oral intake  rtc in 4-6 wk

## 2015-06-08 ENCOUNTER — Telehealth: Payer: Self-pay | Admitting: *Deleted

## 2015-06-08 NOTE — Telephone Encounter (Signed)
Patient/wife here for office visit on 06/05/15 and pt had lab work.  Wondering about results.  RN shared most recent results with the pt's wife comparing to the last results for her.  Wife shared the the patient had a difficult night on Monday and Tuesday during the day.  The wife reported that he was slightly better yesterday.

## 2015-06-09 NOTE — Telephone Encounter (Signed)
i will call them this morning

## 2015-06-21 ENCOUNTER — Other Ambulatory Visit: Payer: Self-pay | Admitting: Family Medicine

## 2015-06-21 DIAGNOSIS — I639 Cerebral infarction, unspecified: Secondary | ICD-10-CM

## 2015-06-21 DIAGNOSIS — C61 Malignant neoplasm of prostate: Secondary | ICD-10-CM

## 2015-06-29 ENCOUNTER — Ambulatory Visit: Payer: Medicare Other | Admitting: Internal Medicine

## 2015-07-04 ENCOUNTER — Other Ambulatory Visit: Payer: Medicare Other

## 2015-07-04 ENCOUNTER — Ambulatory Visit: Payer: Medicare Other | Admitting: Internal Medicine

## 2015-07-21 ENCOUNTER — Ambulatory Visit: Payer: Self-pay | Admitting: Pharmacist

## 2015-07-21 DIAGNOSIS — Z5181 Encounter for therapeutic drug level monitoring: Secondary | ICD-10-CM

## 2015-07-21 DIAGNOSIS — I482 Chronic atrial fibrillation, unspecified: Secondary | ICD-10-CM

## 2015-11-10 DEATH — deceased
